# Patient Record
Sex: Male | Born: 1945 | Race: White | Hispanic: No | Marital: Married | Smoking: Former smoker
Health system: Southern US, Community
[De-identification: ages and names within clinical notes are randomized; demographics above are authoritative.]

## PROBLEM LIST (undated history)

## (undated) DIAGNOSIS — E785 Hyperlipidemia, unspecified: Secondary | ICD-10-CM

## (undated) DIAGNOSIS — R3915 Urgency of urination: Secondary | ICD-10-CM

## (undated) DIAGNOSIS — F419 Anxiety disorder, unspecified: Secondary | ICD-10-CM

## (undated) DIAGNOSIS — E860 Dehydration: Secondary | ICD-10-CM

## (undated) DIAGNOSIS — R350 Frequency of micturition: Secondary | ICD-10-CM

## (undated) DIAGNOSIS — N320 Bladder-neck obstruction: Secondary | ICD-10-CM

## (undated) DIAGNOSIS — Z974 Presence of external hearing-aid: Secondary | ICD-10-CM

## (undated) DIAGNOSIS — Z8739 Personal history of other diseases of the musculoskeletal system and connective tissue: Secondary | ICD-10-CM

## (undated) DIAGNOSIS — Z860100 Personal history of colon polyps, unspecified: Secondary | ICD-10-CM

## (undated) DIAGNOSIS — R351 Nocturia: Secondary | ICD-10-CM

## (undated) DIAGNOSIS — I480 Paroxysmal atrial fibrillation: Secondary | ICD-10-CM

## (undated) DIAGNOSIS — N400 Enlarged prostate without lower urinary tract symptoms: Secondary | ICD-10-CM

## (undated) DIAGNOSIS — E876 Hypokalemia: Secondary | ICD-10-CM

## (undated) DIAGNOSIS — M1990 Unspecified osteoarthritis, unspecified site: Secondary | ICD-10-CM

## (undated) DIAGNOSIS — E119 Type 2 diabetes mellitus without complications: Secondary | ICD-10-CM

## (undated) HISTORY — PX: PICC LINE INSERTION: CATH118290

## (undated) HISTORY — PX: COLONOSCOPY: SHX174

## (undated) HISTORY — DX: Hypokalemia: E87.6

## (undated) HISTORY — DX: Dehydration: E86.0

## (undated) HISTORY — DX: Anxiety disorder, unspecified: F41.9

## (undated) HISTORY — DX: Personal history of other diseases of the musculoskeletal system and connective tissue: Z87.39

## (undated) HISTORY — DX: Hyperlipidemia, unspecified: E78.5

---

## 2000-03-11 ENCOUNTER — Ambulatory Visit (HOSPITAL_COMMUNITY): Admission: RE | Admit: 2000-03-11 | Discharge: 2000-03-11 | Payer: Self-pay | Admitting: *Deleted

## 2006-10-18 ENCOUNTER — Observation Stay (HOSPITAL_COMMUNITY): Admission: EM | Admit: 2006-10-18 | Discharge: 2006-10-19 | Payer: Self-pay | Admitting: Emergency Medicine

## 2006-10-27 ENCOUNTER — Ambulatory Visit (HOSPITAL_COMMUNITY): Admission: RE | Admit: 2006-10-27 | Discharge: 2006-10-27 | Payer: Self-pay | Admitting: Family Medicine

## 2006-11-14 ENCOUNTER — Ambulatory Visit (HOSPITAL_COMMUNITY): Admission: RE | Admit: 2006-11-14 | Discharge: 2006-11-14 | Payer: Self-pay | Admitting: Family Medicine

## 2006-11-15 ENCOUNTER — Ambulatory Visit (HOSPITAL_COMMUNITY): Admission: RE | Admit: 2006-11-15 | Discharge: 2006-11-15 | Payer: Self-pay | Admitting: Family Medicine

## 2006-11-16 ENCOUNTER — Ambulatory Visit: Payer: Self-pay | Admitting: Thoracic Surgery

## 2006-11-16 ENCOUNTER — Inpatient Hospital Stay (HOSPITAL_COMMUNITY): Admission: AD | Admit: 2006-11-16 | Discharge: 2006-11-21 | Payer: Self-pay | Admitting: Thoracic Surgery

## 2006-11-17 ENCOUNTER — Ambulatory Visit: Payer: Self-pay | Admitting: Cardiothoracic Surgery

## 2006-11-30 ENCOUNTER — Encounter: Admission: RE | Admit: 2006-11-30 | Discharge: 2006-11-30 | Payer: Self-pay | Admitting: Cardiothoracic Surgery

## 2006-11-30 ENCOUNTER — Ambulatory Visit: Payer: Self-pay | Admitting: Cardiothoracic Surgery

## 2006-12-01 ENCOUNTER — Ambulatory Visit (HOSPITAL_COMMUNITY): Admission: RE | Admit: 2006-12-01 | Discharge: 2006-12-01 | Payer: Self-pay | Admitting: Family Medicine

## 2007-01-19 ENCOUNTER — Encounter: Admission: RE | Admit: 2007-01-19 | Discharge: 2007-01-19 | Payer: Self-pay | Admitting: Cardiothoracic Surgery

## 2007-01-19 ENCOUNTER — Ambulatory Visit: Payer: Self-pay | Admitting: Cardiothoracic Surgery

## 2011-08-30 ENCOUNTER — Other Ambulatory Visit: Payer: Self-pay | Admitting: Gastroenterology

## 2011-08-30 HISTORY — PX: COLONOSCOPY: SHX174

## 2011-08-30 LAB — HM COLONOSCOPY

## 2012-07-21 HISTORY — PX: KNEE ARTHROSCOPY: SHX127

## 2012-12-07 ENCOUNTER — Encounter: Payer: Self-pay | Admitting: Family Medicine

## 2012-12-07 ENCOUNTER — Other Ambulatory Visit: Payer: Self-pay | Admitting: Family Medicine

## 2012-12-07 DIAGNOSIS — N400 Enlarged prostate without lower urinary tract symptoms: Secondary | ICD-10-CM | POA: Insufficient documentation

## 2012-12-07 DIAGNOSIS — F419 Anxiety disorder, unspecified: Secondary | ICD-10-CM | POA: Insufficient documentation

## 2012-12-07 DIAGNOSIS — M109 Gout, unspecified: Secondary | ICD-10-CM | POA: Insufficient documentation

## 2012-12-07 DIAGNOSIS — E785 Hyperlipidemia, unspecified: Secondary | ICD-10-CM

## 2012-12-07 DIAGNOSIS — K635 Polyp of colon: Secondary | ICD-10-CM | POA: Insufficient documentation

## 2012-12-07 MED ORDER — PRAVASTATIN SODIUM 80 MG PO TABS: 80.0000 mg | ORAL_TABLET | Freq: Every day | ORAL | Status: DC

## 2013-01-22 ENCOUNTER — Inpatient Hospital Stay: Admit: 2013-01-22 | Payer: Self-pay | Admitting: Orthopedic Surgery

## 2013-01-22 ENCOUNTER — Encounter (HOSPITAL_COMMUNITY): Payer: Self-pay | Admitting: Pharmacy Technician

## 2013-01-22 SURGERY — ARTHROPLASTY, KNEE, UNICOMPARTMENTAL
Anesthesia: Spinal | Laterality: Left

## 2013-01-30 ENCOUNTER — Inpatient Hospital Stay (HOSPITAL_COMMUNITY): Admission: RE | Admit: 2013-01-30 | Payer: Self-pay | Source: Ambulatory Visit

## 2013-01-31 ENCOUNTER — Other Ambulatory Visit (HOSPITAL_COMMUNITY): Payer: Self-pay | Admitting: *Deleted

## 2013-01-31 NOTE — H&P (Signed)
TOTAL KNEE ADMISSION H&P  Patient is being admitted for left medial unicompartment knee arthroplasty.  Subjective:  Chief Complaint: Left knee medial compartment OA / pain.  HPI: BRUK TUMOLO, 67 y.o. male, has a history of pain and functional disability in the left knee due to arthritis and has failed non-surgical conservative treatments for greater than 12 weeks to includeNSAID's and/or analgesics, corticosteriod injections, viscosupplementation injections, activity modification and aspirations.  Onset of symptoms was gradual, starting >10 years ago with gradually worsening course since that time. The patient noted prior procedures on the knee to include  arthroscopy on the left knee(s).  Patient currently rates pain in the left knee(s) at 7 out of 10 with activity. Patient has night pain, worsening of pain with activity and weight bearing, pain that interferes with activities of daily living, pain with passive range of motion, crepitus and joint swelling.  Patient has evidence of periarticular osteophytes and joint space narrowing of the medial compartment by imaging studies. There is no active infection.  Risks, benefits and expectations were discussed with the patient. Patient understand the risks, benefits and expectations and wishes to proceed with surgery.   D/C Plans:   Home with HHPT  Post-op Meds:   No Rx given    Tranexamic Acid:   To be given  Decadron:    To be given  FYI:    ASA post-op   Patient Active Problem List   Diagnosis Date Noted  . Hyperlipidemia   . Anxiety   . BPH (benign prostatic hyperplasia)   . Colon polyps   . H/O: gout    Past Medical History  Diagnosis Date  . Hyperlipidemia   . Anxiety   . BPH (benign prostatic hyperplasia)   . Colon polyps   . H/O: gout     Past Surgical History  Procedure Laterality Date  . Knee arthroscopy Left 07/2012  . Fx ribs w/puncture lung 2007      No prescriptions prior to admission   Allergies  Allergen  Reactions  . Penicillins Rash    History  Substance Use Topics  . Smoking status: Former Smoker    Quit date: 12/08/2002  . Smokeless tobacco: Never Used  . Alcohol Use: Yes    Family History  Problem Relation Age of Onset  . Adopted: Yes  . Family history unknown: Yes     Review of Systems  Constitutional: Negative.   HENT: Negative.   Eyes: Negative.   Respiratory: Negative.   Cardiovascular: Negative.   Gastrointestinal: Negative.   Genitourinary: Negative.   Musculoskeletal: Positive for joint pain.  Skin: Negative.   Neurological: Negative.   Endo/Heme/Allergies: Negative.   Psychiatric/Behavioral: The patient is nervous/anxious.     Objective:  Physical Exam  Constitutional: He is oriented to person, place, and time. He appears well-developed and well-nourished.  HENT:  Head: Normocephalic and atraumatic.  Mouth/Throat: Oropharynx is clear and moist.  Eyes: Pupils are equal, round, and reactive to light.  Neck: Neck supple. No JVD present. No tracheal deviation present. No thyromegaly present.  Cardiovascular: Normal rate, regular rhythm, normal heart sounds and intact distal pulses.   Respiratory: Effort normal and breath sounds normal. No respiratory distress. He has no wheezes.  GI: Soft. There is no tenderness. There is no guarding.  Musculoskeletal:       Left knee: He exhibits decreased range of motion, swelling and bony tenderness. He exhibits no effusion, no ecchymosis, no deformity, no laceration and no erythema. Tenderness found.  Medial joint line tenderness noted. No lateral joint line tenderness noted.  Lymphadenopathy:    He has no cervical adenopathy.  Neurological: He is alert and oriented to person, place, and time.  Skin: Skin is warm and dry.     Labs:  Estimated body mass index is 34.17 kg/(m^2) as calculated from the following:   Height as of 11/08/12: 6' (1.829 m).   Weight as of 12/07/12: 114.306 kg (252 lb).   Imaging Review Plain  radiographs demonstrate severe degenerative joint disease of the left knee medial compartment. The overall alignment is neutral. The bone quality appears to be good for age and reported activity level.  Assessment/Plan:  End stage arthritis, medial compartment of the left knee   The patient history, physical examination, clinical judgment of the provider and imaging studies are consistent with end stage degenerative joint disease of the left knee(s) and medial unicompartmental knee arthroplasty is deemed medically necessary. The treatment options including medical management, injection therapy arthroscopy and arthroplasty were discussed at length. The risks and benefits of total knee arthroplasty were presented and reviewed. The risks due to aseptic loosening, infection, stiffness, patella tracking problems, thromboembolic complications and other imponderables were discussed. The patient acknowledged the explanation, agreed to proceed with the plan and consent was signed. Patient is being admitted for inpatient treatment for surgery, pain control, PT, OT, prophylactic antibiotics, VTE prophylaxis, progressive ambulation and ADL's and discharge planning. The patient is planning to be discharged home with home health services.    Anastasio Auerbach Seanmichael Salmons   PAC  01/31/2013, 9:47 PM

## 2013-02-01 ENCOUNTER — Encounter (HOSPITAL_COMMUNITY)
Admission: RE | Admit: 2013-02-01 | Discharge: 2013-02-01 | Disposition: A | Discharge status: Home / Self Care | Payer: Medicare Other | Source: Ambulatory Visit | Attending: Orthopedic Surgery | Admitting: Orthopedic Surgery

## 2013-02-01 ENCOUNTER — Encounter (HOSPITAL_COMMUNITY): Payer: Self-pay

## 2013-02-01 DIAGNOSIS — M1710 Unilateral primary osteoarthritis, unspecified knee: Secondary | ICD-10-CM | POA: Insufficient documentation

## 2013-02-01 DIAGNOSIS — Z01812 Encounter for preprocedural laboratory examination: Secondary | ICD-10-CM | POA: Insufficient documentation

## 2013-02-01 HISTORY — DX: Unspecified osteoarthritis, unspecified site: M19.90

## 2013-02-01 LAB — CBC
Platelets: 245 10*3/uL (ref 150–400)
RDW: 12.8 % (ref 11.5–15.5)
WBC: 8.6 10*3/uL (ref 4.0–10.5)

## 2013-02-01 LAB — BASIC METABOLIC PANEL
Calcium: 9.2 mg/dL (ref 8.4–10.5)
Creatinine, Ser: 0.88 mg/dL (ref 0.50–1.35)
GFR calc Af Amer: >90 mL/min (ref >90)
GFR calc non Af Amer: 88 mL/min — ABNORMAL LOW (ref >90)
Sodium: 145 mEq/L (ref 135–145)

## 2013-02-01 LAB — SURGICAL PCR SCREEN: Staphylococcus aureus: NEGATIVE

## 2013-02-01 LAB — URINALYSIS, ROUTINE W REFLEX MICROSCOPIC
Glucose, UA: NEGATIVE mg/dL
Ketones, ur: NEGATIVE mg/dL
Leukocytes, UA: NEGATIVE
Protein, ur: NEGATIVE mg/dL
Urobilinogen, UA: 0.2 mg/dL (ref 0.0–1.0)

## 2013-02-01 LAB — PROTIME-INR
INR: 1.03 (ref 0.00–1.49)
Prothrombin Time: 13.4 seconds (ref 11.6–15.2)

## 2013-02-01 LAB — APTT: aPTT: 38 seconds — ABNORMAL HIGH (ref 24–37)

## 2013-02-01 NOTE — Patient Instructions (Addendum)
Jimmy Bradshaw  02/01/2013                           YOUR PROCEDURE IS SCHEDULED ON: 02/05/13               PLEASE REPORT TO SHORT STAY CENTER AT :  12:30 PM               CALL THIS NUMBER IF ANY PROBLEMS THE DAY OF SURGERY :               [phone]                      REMEMBER:   Do not eat food or drink liquids AFTER MIDNIGHT  May have clear liquids UNTIL 6 HOURS BEFORE SURGERY (9:30 AM)  Clear liquids include soda, tea, black coffee, apple or grape juice, broth.  Take these medicines the morning of surgery with A SIP OF WATER: BUPROPION / RAPAFLO / VIBRYD   Do not wear jewelry, make-up   Do not wear lotions, powders, or perfumes.   Do not shave legs or underarms 12 hrs. before surgery (men may shave face)  Do not bring valuables to the hospital.  Contacts, dentures or bridgework may not be worn into surgery.  Leave suitcase in the car. After surgery it may be brought to your room.  For patients admitted to the hospital more than one night, checkout time is 11:00                          The day of discharge.   Patients discharged the day of surgery will not be allowed to drive home                             If going home same day of surgery, must have someone stay with you first                           24 hrs at home and arrange for some one to drive you home from hospital.    Special Instructions:   Please read over the following fact sheets that you were given:               1. MRSA  INFORMATION                      2. [place] PREPARING FOR SURGERY SHEET               3. INCENTIVE SPIROMETER                                                X_____________________________________________________________________        Failure to follow these instructions may result in cancellation of your surgery

## 2013-02-05 ENCOUNTER — Encounter (HOSPITAL_COMMUNITY): Payer: Self-pay | Admitting: *Deleted

## 2013-02-05 ENCOUNTER — Inpatient Hospital Stay (HOSPITAL_COMMUNITY)
Admission: RE | Admit: 2013-02-05 | Discharge: 2013-02-06 | DRG: 470 | Disposition: A | Discharge status: Home Health | Payer: Medicare Other | Source: Ambulatory Visit | Attending: Orthopedic Surgery | Admitting: Orthopedic Surgery

## 2013-02-05 ENCOUNTER — Ambulatory Visit (HOSPITAL_COMMUNITY): Payer: Medicare Other | Admitting: Anesthesiology

## 2013-02-05 ENCOUNTER — Encounter (HOSPITAL_COMMUNITY): Payer: Self-pay | Admitting: Anesthesiology

## 2013-02-05 ENCOUNTER — Encounter (HOSPITAL_COMMUNITY)
Admission: RE | Disposition: A | Discharge status: Home Health | Payer: Self-pay | Source: Ambulatory Visit | Attending: Orthopedic Surgery

## 2013-02-05 DIAGNOSIS — M109 Gout, unspecified: Secondary | ICD-10-CM | POA: Diagnosis present

## 2013-02-05 DIAGNOSIS — E669 Obesity, unspecified: Secondary | ICD-10-CM | POA: Diagnosis present

## 2013-02-05 DIAGNOSIS — M1710 Unilateral primary osteoarthritis, unspecified knee: Principal | ICD-10-CM | POA: Diagnosis present

## 2013-02-05 DIAGNOSIS — N400 Enlarged prostate without lower urinary tract symptoms: Secondary | ICD-10-CM | POA: Diagnosis present

## 2013-02-05 DIAGNOSIS — E785 Hyperlipidemia, unspecified: Secondary | ICD-10-CM | POA: Diagnosis present

## 2013-02-05 DIAGNOSIS — Z87891 Personal history of nicotine dependence: Secondary | ICD-10-CM

## 2013-02-05 DIAGNOSIS — Z79899 Other long term (current) drug therapy: Secondary | ICD-10-CM

## 2013-02-05 DIAGNOSIS — Z6834 Body mass index (BMI) 34.0-34.9, adult: Secondary | ICD-10-CM

## 2013-02-05 DIAGNOSIS — F411 Generalized anxiety disorder: Secondary | ICD-10-CM | POA: Diagnosis present

## 2013-02-05 DIAGNOSIS — Z880 Allergy status to penicillin: Secondary | ICD-10-CM

## 2013-02-05 DIAGNOSIS — D126 Benign neoplasm of colon, unspecified: Secondary | ICD-10-CM | POA: Diagnosis present

## 2013-02-05 DIAGNOSIS — Z96652 Presence of left artificial knee joint: Secondary | ICD-10-CM

## 2013-02-05 HISTORY — PX: PARTIAL KNEE ARTHROPLASTY: SHX2174

## 2013-02-05 SURGERY — ARTHROPLASTY, KNEE, UNICOMPARTMENTAL
Anesthesia: Spinal | Site: Knee | Laterality: Left | Wound class: Clean

## 2013-02-05 MED ORDER — TRANEXAMIC ACID 100 MG/ML IV SOLN
1000.0000 mg | Freq: Once | INTRAVENOUS | Status: AC
  Administered 2013-02-05: 1000 mg via INTRAVENOUS
  Filled 2013-02-05: qty 10

## 2013-02-05 MED ORDER — PHENYLEPHRINE HCL 10 MG/ML IJ SOLN
INTRAMUSCULAR | Status: DC | PRN
  Administered 2013-02-05 (×3): 80 ug via INTRAVENOUS

## 2013-02-05 MED ORDER — CHLORHEXIDINE GLUCONATE 4 % EX LIQD: 60.0000 mL | Freq: Once | CUTANEOUS | Status: DC

## 2013-02-05 MED ORDER — ASPIRIN EC 325 MG PO TBEC
325.0000 mg | DELAYED_RELEASE_TABLET | Freq: Two times a day (BID) | ORAL | Status: DC
  Administered 2013-02-06: 325 mg via ORAL
  Filled 2013-02-05 (×3): qty 1

## 2013-02-05 MED ORDER — PROMETHAZINE HCL 25 MG/ML IJ SOLN: 6.2500 mg | INTRAMUSCULAR | Status: DC | PRN

## 2013-02-05 MED ORDER — PROPOFOL INFUSION 10 MG/ML OPTIME
INTRAVENOUS | Status: DC | PRN
  Administered 2013-02-05: 75 ug/kg/min via INTRAVENOUS

## 2013-02-05 MED ORDER — ACETAMINOPHEN 10 MG/ML IV SOLN
INTRAVENOUS | Status: DC | PRN
  Administered 2013-02-05: 1000 mg via INTRAVENOUS

## 2013-02-05 MED ORDER — ZOLPIDEM TARTRATE 5 MG PO TABS: 5.0000 mg | ORAL_TABLET | Freq: Every evening | ORAL | Status: DC | PRN

## 2013-02-05 MED ORDER — LACTATED RINGERS IV SOLN
INTRAVENOUS | Status: DC
  Administered 2013-02-05: 1000 mL via INTRAVENOUS
  Administered 2013-02-05 (×2): via INTRAVENOUS

## 2013-02-05 MED ORDER — DIPHENHYDRAMINE HCL 25 MG PO CAPS: 25.0000 mg | ORAL_CAPSULE | Freq: Four times a day (QID) | ORAL | Status: DC | PRN

## 2013-02-05 MED ORDER — SIMVASTATIN 40 MG PO TABS
40.0000 mg | ORAL_TABLET | Freq: Every day | ORAL | Status: DC
  Filled 2013-02-05: qty 1

## 2013-02-05 MED ORDER — DOCUSATE SODIUM 100 MG PO CAPS
100.0000 mg | ORAL_CAPSULE | Freq: Two times a day (BID) | ORAL | Status: DC
  Administered 2013-02-05 – 2013-02-06 (×2): 100 mg via ORAL

## 2013-02-05 MED ORDER — CELECOXIB 200 MG PO CAPS
200.0000 mg | ORAL_CAPSULE | Freq: Two times a day (BID) | ORAL | Status: DC
  Administered 2013-02-05 – 2013-02-06 (×2): 200 mg via ORAL
  Filled 2013-02-05 (×3): qty 1

## 2013-02-05 MED ORDER — 0.9 % SODIUM CHLORIDE (POUR BTL) OPTIME
TOPICAL | Status: DC | PRN
  Administered 2013-02-05: 1000 mL

## 2013-02-05 MED ORDER — BUPIVACAINE-EPINEPHRINE PF 0.25-1:200000 % IJ SOLN
INTRAMUSCULAR | Status: DC | PRN
  Administered 2013-02-05: 25 mL

## 2013-02-05 MED ORDER — BISACODYL 10 MG RE SUPP: 10.0000 mg | Freq: Every day | RECTAL | Status: DC | PRN

## 2013-02-05 MED ORDER — HYDROMORPHONE HCL PF 1 MG/ML IJ SOLN: 0.5000 mg | INTRAMUSCULAR | Status: DC | PRN

## 2013-02-05 MED ORDER — HYDROCODONE-ACETAMINOPHEN 7.5-325 MG PO TABS
1.0000 | ORAL_TABLET | ORAL | Status: DC
  Administered 2013-02-05: 1 via ORAL
  Administered 2013-02-05 – 2013-02-06 (×2): 2 via ORAL
  Administered 2013-02-06 (×3): 1 via ORAL
  Filled 2013-02-05: qty 1
  Filled 2013-02-05 (×3): qty 2
  Filled 2013-02-05: qty 1
  Filled 2013-02-05: qty 2

## 2013-02-05 MED ORDER — ACETAMINOPHEN 10 MG/ML IV SOLN
INTRAVENOUS | Status: AC
  Filled 2013-02-05: qty 100

## 2013-02-05 MED ORDER — ONDANSETRON HCL 4 MG/2ML IJ SOLN: 4.0000 mg | Freq: Four times a day (QID) | INTRAMUSCULAR | Status: DC | PRN

## 2013-02-05 MED ORDER — KETOROLAC TROMETHAMINE 30 MG/ML IJ SOLN
INTRAMUSCULAR | Status: AC
  Filled 2013-02-05: qty 1

## 2013-02-05 MED ORDER — CLINDAMYCIN PHOSPHATE 900 MG/50ML IV SOLN
900.0000 mg | INTRAVENOUS | Status: AC
  Administered 2013-02-05: 900 mg via INTRAVENOUS
  Filled 2013-02-05: qty 50

## 2013-02-05 MED ORDER — CLINDAMYCIN PHOSPHATE 900 MG/50ML IV SOLN
INTRAVENOUS | Status: AC
  Filled 2013-02-05: qty 50

## 2013-02-05 MED ORDER — FENTANYL CITRATE 0.05 MG/ML IJ SOLN
INTRAMUSCULAR | Status: DC | PRN
  Administered 2013-02-05: 100 ug via INTRAVENOUS

## 2013-02-05 MED ORDER — FLEET ENEMA 7-19 GM/118ML RE ENEM: 1.0000 | ENEMA | Freq: Once | RECTAL | Status: AC | PRN

## 2013-02-05 MED ORDER — POLYETHYLENE GLYCOL 3350 17 G PO PACK
17.0000 g | PACK | Freq: Two times a day (BID) | ORAL | Status: DC
  Administered 2013-02-05 – 2013-02-06 (×2): 17 g via ORAL

## 2013-02-05 MED ORDER — BUPIVACAINE-EPINEPHRINE PF 0.25-1:200000 % IJ SOLN
INTRAMUSCULAR | Status: AC
  Filled 2013-02-05: qty 30

## 2013-02-05 MED ORDER — METHOCARBAMOL 500 MG PO TABS: 500.0000 mg | ORAL_TABLET | Freq: Four times a day (QID) | ORAL | Status: DC | PRN

## 2013-02-05 MED ORDER — DEXAMETHASONE SODIUM PHOSPHATE 10 MG/ML IJ SOLN
10.0000 mg | Freq: Once | INTRAMUSCULAR | Status: DC
  Filled 2013-02-05: qty 1

## 2013-02-05 MED ORDER — PHENOL 1.4 % MT LIQD: 1.0000 | OROMUCOSAL | Status: DC | PRN

## 2013-02-05 MED ORDER — SODIUM CHLORIDE 0.9 % IV SOLN
INTRAVENOUS | Status: DC
  Administered 2013-02-05: 20:00:00 via INTRAVENOUS
  Filled 2013-02-05 (×3): qty 1000

## 2013-02-05 MED ORDER — HYDROMORPHONE HCL PF 1 MG/ML IJ SOLN: 0.2500 mg | INTRAMUSCULAR | Status: DC | PRN

## 2013-02-05 MED ORDER — ONDANSETRON HCL 4 MG PO TABS: 4.0000 mg | ORAL_TABLET | Freq: Four times a day (QID) | ORAL | Status: DC | PRN

## 2013-02-05 MED ORDER — BUPROPION HCL ER (XL) 300 MG PO TB24
300.0000 mg | ORAL_TABLET | Freq: Every morning | ORAL | Status: DC
  Administered 2013-02-06: 300 mg via ORAL
  Filled 2013-02-05: qty 1

## 2013-02-05 MED ORDER — BUPIVACAINE LIPOSOME 1.3 % IJ SUSP
20.0000 mL | Freq: Once | INTRAMUSCULAR | Status: DC
  Filled 2013-02-05: qty 20

## 2013-02-05 MED ORDER — CLINDAMYCIN PHOSPHATE 600 MG/50ML IV SOLN
600.0000 mg | Freq: Four times a day (QID) | INTRAVENOUS | Status: AC
  Administered 2013-02-05 – 2013-02-06 (×2): 600 mg via INTRAVENOUS
  Filled 2013-02-05 (×2): qty 50

## 2013-02-05 MED ORDER — BUPIVACAINE IN DEXTROSE 0.75-8.25 % IT SOLN
INTRATHECAL | Status: DC | PRN
  Administered 2013-02-05: 2 mL via INTRATHECAL

## 2013-02-05 MED ORDER — MENTHOL 3 MG MT LOZG: 1.0000 | LOZENGE | OROMUCOSAL | Status: DC | PRN

## 2013-02-05 MED ORDER — MIDAZOLAM HCL 5 MG/5ML IJ SOLN
INTRAMUSCULAR | Status: DC | PRN
  Administered 2013-02-05: 1.5 mg via INTRAVENOUS
  Administered 2013-02-05: 2 mg via INTRAVENOUS

## 2013-02-05 MED ORDER — KETOROLAC TROMETHAMINE 30 MG/ML IJ SOLN
INTRAMUSCULAR | Status: DC | PRN
  Administered 2013-02-05: 30 mg via INTRAVENOUS

## 2013-02-05 MED ORDER — DEXAMETHASONE SODIUM PHOSPHATE 10 MG/ML IJ SOLN
10.0000 mg | Freq: Once | INTRAMUSCULAR | Status: AC
  Administered 2013-02-05: 10 mg via INTRAVENOUS

## 2013-02-05 MED ORDER — DEXTROSE 5 % IV SOLN: 500.0000 mg | Freq: Four times a day (QID) | INTRAVENOUS | Status: DC | PRN

## 2013-02-05 MED ORDER — TAMSULOSIN HCL 0.4 MG PO CAPS
0.4000 mg | ORAL_CAPSULE | Freq: Every day | ORAL | Status: DC
Start: 2013-02-06 — End: 2013-02-06
  Administered 2013-02-06: 0.4 mg via ORAL
  Filled 2013-02-05: qty 1

## 2013-02-05 MED ORDER — ALUM & MAG HYDROXIDE-SIMETH 200-200-20 MG/5ML PO SUSP: 30.0000 mL | ORAL | Status: DC | PRN

## 2013-02-05 MED ORDER — STERILE WATER FOR IRRIGATION IR SOLN
Status: DC | PRN
  Administered 2013-02-05: 1500 mL

## 2013-02-05 MED ORDER — FERROUS SULFATE 325 (65 FE) MG PO TABS
325.0000 mg | ORAL_TABLET | Freq: Three times a day (TID) | ORAL | Status: DC
  Administered 2013-02-05 – 2013-02-06 (×2): 325 mg via ORAL
  Filled 2013-02-05 (×5): qty 1

## 2013-02-05 MED ORDER — METOCLOPRAMIDE HCL 10 MG PO TABS: 5.0000 mg | ORAL_TABLET | Freq: Three times a day (TID) | ORAL | Status: DC | PRN

## 2013-02-05 MED ORDER — SODIUM CHLORIDE 0.9 % IJ SOLN
INTRAMUSCULAR | Status: DC | PRN
  Administered 2013-02-05: 16:00:00

## 2013-02-05 MED ORDER — VILAZODONE HCL 20 MG PO TABS
1.0000 | ORAL_TABLET | Freq: Every morning | ORAL | Status: DC
  Administered 2013-02-06: 20 mg via ORAL
  Filled 2013-02-05: qty 1

## 2013-02-05 MED ORDER — METOCLOPRAMIDE HCL 5 MG/ML IJ SOLN: 5.0000 mg | Freq: Three times a day (TID) | INTRAMUSCULAR | Status: DC | PRN

## 2013-02-05 SURGICAL SUPPLY — 58 items
ADH SKN CLS APL DERMABOND .7 (GAUZE/BANDAGES/DRESSINGS) ×1
BAG SPEC THK2 15X12 ZIP CLS (MISCELLANEOUS) ×1
BAG ZIPLOCK 12X15 (MISCELLANEOUS) ×2 IMPLANT
BANDAGE ELASTIC 6 VELCRO ST LF (GAUZE/BANDAGES/DRESSINGS) ×2 IMPLANT
BANDAGE ESMARK 6X9 LF (GAUZE/BANDAGES/DRESSINGS) ×1 IMPLANT
BLADE SAW RECIPROCATING 77.5 (BLADE) ×2 IMPLANT
BLADE SAW SGTL 13.0X1.19X90.0M (BLADE) ×2 IMPLANT
BNDG CMPR 9X6 STRL LF SNTH (GAUZE/BANDAGES/DRESSINGS) ×1
BNDG ESMARK 6X9 LF (GAUZE/BANDAGES/DRESSINGS) ×2
BOWL SMART MIX CTS (DISPOSABLE) ×2 IMPLANT
CEMENT HV SMART SET (Cement) ×2 IMPLANT
CLOTH BEACON ORANGE TIMEOUT ST (SAFETY) ×2 IMPLANT
COVER SURGICAL LIGHT HANDLE (MISCELLANEOUS) ×2 IMPLANT
CUFF TOURN SGL QUICK 34 (TOURNIQUET CUFF) ×2
CUFF TRNQT CYL 34X4X40X1 (TOURNIQUET CUFF) ×1 IMPLANT
DERMABOND ADVANCED (GAUZE/BANDAGES/DRESSINGS) ×1
DERMABOND ADVANCED .7 DNX12 (GAUZE/BANDAGES/DRESSINGS) ×1 IMPLANT
DRAPE EXTREMITY T 121X128X90 (DRAPE) ×2 IMPLANT
DRAPE POUCH INSTRU U-SHP 10X18 (DRAPES) ×2 IMPLANT
DRSG AQUACEL AG ADV 3.5X 6 (GAUZE/BANDAGES/DRESSINGS) ×2 IMPLANT
DRSG AQUACEL AG ADV 3.5X10 (GAUZE/BANDAGES/DRESSINGS) ×1 IMPLANT
DRSG TEGADERM 4X4.75 (GAUZE/BANDAGES/DRESSINGS) ×2 IMPLANT
DURAPREP 26ML APPLICATOR (WOUND CARE) ×2 IMPLANT
ELECT REM PT RETURN 9FT ADLT (ELECTROSURGICAL) ×2
ELECTRODE REM PT RTRN 9FT ADLT (ELECTROSURGICAL) ×1 IMPLANT
EVACUATOR 1/8 PVC DRAIN (DRAIN) ×2 IMPLANT
FACESHIELD LNG OPTICON STERILE (SAFETY) ×8 IMPLANT
GAUZE SPONGE 2X2 8PLY STRL LF (GAUZE/BANDAGES/DRESSINGS) ×1 IMPLANT
GLOVE BIOGEL PI IND STRL 6.5 (GLOVE) IMPLANT
GLOVE BIOGEL PI IND STRL 7.5 (GLOVE) ×1 IMPLANT
GLOVE BIOGEL PI IND STRL 8 (GLOVE) IMPLANT
GLOVE BIOGEL PI INDICATOR 6.5 (GLOVE) ×1
GLOVE BIOGEL PI INDICATOR 7.5 (GLOVE) ×2
GLOVE BIOGEL PI INDICATOR 8 (GLOVE)
GLOVE ORTHO TXT STRL SZ7.5 (GLOVE) ×4 IMPLANT
GLOVE SURG SS PI 6.5 STRL IVOR (GLOVE) ×1 IMPLANT
GLOVE SURG SS PI 7.5 STRL IVOR (GLOVE) ×1 IMPLANT
GLOVE SURG SS PI 8.5 STRL IVOR (GLOVE) ×2
GLOVE SURG SS PI 8.5 STRL STRW (GLOVE) IMPLANT
GOWN BRE IMP PREV XXLGXLNG (GOWN DISPOSABLE) ×4 IMPLANT
GOWN STRL NON-REIN LRG LVL3 (GOWN DISPOSABLE) ×3 IMPLANT
GOWN STRL REIN 2XL XLG LVL4 (GOWN DISPOSABLE) ×1 IMPLANT
KIT BASIN OR (CUSTOM PROCEDURE TRAY) ×2 IMPLANT
LEGGING LITHOTOMY PAIR STRL (DRAPES) ×2 IMPLANT
MANIFOLD NEPTUNE II (INSTRUMENTS) ×2 IMPLANT
NDL SAFETY ECLIPSE 18X1.5 (NEEDLE) ×1 IMPLANT
NEEDLE HYPO 18GX1.5 SHARP (NEEDLE) ×2
PACK TOTAL JOINT (CUSTOM PROCEDURE TRAY) ×2 IMPLANT
POSITIONER SURGICAL ARM (MISCELLANEOUS) ×2 IMPLANT
SPONGE GAUZE 2X2 STER 10/PKG (GAUZE/BANDAGES/DRESSINGS) ×1
SUCTION FRAZIER TIP 10 FR DISP (SUCTIONS) ×2 IMPLANT
SUT MNCRL AB 4-0 PS2 18 (SUTURE) ×2 IMPLANT
SUT VIC AB 1 CT1 36 (SUTURE) ×4 IMPLANT
SUT VIC AB 2-0 CT1 27 (SUTURE) ×4
SUT VIC AB 2-0 CT1 TAPERPNT 27 (SUTURE) ×2 IMPLANT
SYR 50ML LL SCALE MARK (SYRINGE) ×2 IMPLANT
TOWEL OR 17X26 10 PK STRL BLUE (TOWEL DISPOSABLE) ×4 IMPLANT
TRAY FOLEY CATH 14FRSI W/METER (CATHETERS) ×2 IMPLANT

## 2013-02-05 NOTE — Op Note (Signed)
NAME: Jimmy Bradshaw    MEDICAL RECORD NO.: [id number]   FACILITY: [facility]   DATE OF BIRTH: 24-Sep-1945  PHYSICIAN: Madlyn Frankel. Charlann Boxer, M.D.    DATE OF PROCEDURE: 02/05/2013    OPERATIVE REPORT   PREOPERATIVE DIAGNOSIS: Left knee medial compartment osteoarthritis.   POSTOPERATIVE DIAGNOSIS: Left knee medial compartment osteoarthritis.  PROCEDURE: Left partial knee replacement utilizing Biomet Oxford knee  component, size medium twin peg femur, a left medial size C tibial tray with a size 4 left medial insert.   SURGEON: Madlyn Frankel. Charlann Boxer, M.D.   ASSISTANT: Lanney Gins, PAC.  Please note that Mr. Jimmy Bradshaw was present for the entirety of the case,  utilized for preoperative positioning, perioperative retractor  management, general facilitation of the case and primary wound closure.   ANESTHESIA: Spinal.   SPECIMENS: None.   COMPLICATIONS: None.  DRAINS: 1 medium HV   TOURNIQUET TIME: 33 minutes at 250 mmHg.   INDICATIONS FOR PROCEDURE: The patient is a 67 yo male patient of mine who presented for evaluation of left knee pain.  They presented with primary complaints of pain on the medial side of their knee. Radiographs revealed advanced medial compartment arthritis with specifically an antero-medial wear pattern.  There was bone on bone changes noted with subchondral sclerosis and osteophytes present. The patient has had progressive problems failing to respond to conservative measures of medications, injections and activity modification. Risks of infection, DVT, component failure, need for future revision surgery were all discussed and reviewed.  Consent was obtained for benefit of pain relief.   PROCEDURE IN DETAIL: The patient was brought to the operative theater.  Once adequate anesthesia, preoperative antibiotics, 2gm Ancef administered, the patient was positioned in supine position with a left thigh tourniquet  placed. The left lower extremity was prepped and draped in sterile   fashion with the leg on the Oxford leg holder.  The leg was allowed to flex to 120 degrees. A time-out  was performed identifying the patient, planned procedure, and extremity.  The leg was exsanguinated, tourniquet elevated to 250 mmHg. A midline  incision was made from the proximal pole of the patella to the tibial tubercle. A  soft tissue plane was created and partial median arthrotomy was then  made to allow for subluxation of the patella. Following initial synovectomy and  debridement, the osteophytes were removed off the medial aspect of the  knee.   Attention was first directed to the tibia. The tibial  extramedullary guide was positioned over the anterior crest of the tibia  and pinned into position, and using a measured resection guide from the  Oxford system, a 4 mm resection was made off the proximal tibia. First  the reciprocating saw along the medial aspect of the tibial spines, then the oscillating saw.    At this point, I sized this cut surface seem to be best fit for a size C tibial tray.  With the retractors out of the wound and the knee held at 90 degrees the 4 feeler gauge had appropriate tension on the medial ligament.   At this point, the femoral canal was opened with a drill and the  intramedullary rod passed. Then using the guide for a medium posterior resection off  the posterior aspect of the femur was positioned over the mid portion of the medial femoral condyle.  The orientation was set using the guide that mates the femoral guide to the intramedullary rod.  The 2 drill holes were made into the distal  femur.  The posterior guide was then impacted into place and the posterior  femoral cut made.  At this point, I milled the distal femur with a size 4 spigot in place. At this point, we did a trial reduction of the medium femur, size C tibial tray and a 4 feeler gauge. At 90 degrees of  flexion and at 20 degrees of flexion the knee had symmetric tension on  the  ligaments.   Given these findings, the trial femoral component was removed. Final preparation of tibia was carried out by pinning it in position. Then  using a reciprocating saw I removed bone for the keel. Further bone was  removed with an osteotome.  Trial reduction was now carried out with the medium femur, the keeled C tibia, and the 4 lollipop insert. The balance of the  ligaments appeared to be symmetric at 20 degrees and 90 degrees. Given  all these findings, the trial components were removed.   Cement was mixed. The final components were opened. The knee was irrigated with  normal saline solution. Then final debridements of the  soft tissue was carried out, I also drilled the sclerotic bone with a drill.  The final components were cemented with a single batch of cement in a  two-stage technique with the tibial component cemented first. The knee  was then brought  to 45 degrees of flexion with a 4 feeler gauge, held with pressure for a minute and half.  After this the femoral component was cemented in place.  The knee was again held at 45 degrees of flexion while the cement fully cured.  Excess cement was removed throughout the knee. Tourniquet was let down  after 33 minutes. After the cement had fully cured and excessive cement  was removed throughout the knee there was no visualized cement present.   The final size 4 left medial insert was chosen and snapped into position. We re-irrigated  the knee. I placed a medium Hemovac drain deep. The extensor mechanism  was then reapproximated using a #1 Vicryl with the knee in flexion. The  remaining wound was closed with 2-0 Vicryl and a running 4-0 Monocryl.  The knee was cleaned, dried, and dressed sterilely using Dermabond and  Aquacel dressing. The drain site was dressed separately. The patient  was brought to the recovery room, Ace wrap in place, tolerating the  procedure well. He will be in the hospital for overnight observation.   We will initiate physical therapy and progress to ambulate.     Madlyn Frankel Charlann Boxer, M.D.

## 2013-02-05 NOTE — Anesthesia Postprocedure Evaluation (Signed)
  Anesthesia Post-op Note  Patient: Jimmy Bradshaw  Procedure(s) Performed: Procedure(s) (LRB): LEFT KNEE MEDIAL UNICOMPARTMENTAL KNEE (Left)  Patient Location: PACU  Anesthesia Type: Spinal  Level of Consciousness: awake and alert   Airway and Oxygen Therapy: Patient Spontanous Breathing  Post-op Pain: mild  Post-op Assessment: Post-op Vital signs reviewed, Patient's Cardiovascular Status Stable, Respiratory Function Stable, Patent Airway and No signs of Nausea or vomiting  Last Vitals:  Filed Vitals:   02/05/13 1730  BP: 119/62  Pulse: 74  Temp:   Resp: 13    Post-op Vital Signs: stable   Complications: No apparent anesthesia complications

## 2013-02-05 NOTE — Anesthesia Procedure Notes (Signed)

## 2013-02-05 NOTE — Anesthesia Preprocedure Evaluation (Signed)
Anesthesia Evaluation  Patient identified by MRN, date of birth, ID band Patient awake    Reviewed: Allergy & Precautions, H&P , NPO status , Patient's Chart, lab work & pertinent test results  Airway Mallampati: II TM Distance: >3 FB Neck ROM: Full    Dental no notable dental hx.    Pulmonary neg pulmonary ROS,  breath sounds clear to auscultation  Pulmonary exam normal       Cardiovascular negative cardio ROS  Rhythm:Regular Rate:Normal     Neuro/Psych negative neurological ROS  negative psych ROS   GI/Hepatic negative GI ROS, Neg liver ROS,   Endo/Other  Morbid obesity  Renal/GU negative Renal ROS  negative genitourinary   Musculoskeletal negative musculoskeletal ROS (+)   Abdominal   Peds negative pediatric ROS (+)  Hematology negative hematology ROS (+)   Anesthesia Other Findings   Reproductive/Obstetrics negative OB ROS                           Anesthesia Physical Anesthesia Plan  ASA: II  Anesthesia Plan: Spinal   Post-op Pain Management:    Induction:   Airway Management Planned: Simple Face Mask  Additional Equipment:   Intra-op Plan:   Post-operative Plan:   Informed Consent: I have reviewed the patients History and Physical, chart, labs and discussed the procedure including the risks, benefits and alternatives for the proposed anesthesia with the patient or authorized representative who has indicated his/her understanding and acceptance.     Plan Discussed with: CRNA and Surgeon  Anesthesia Plan Comments:         Anesthesia Quick Evaluation

## 2013-02-05 NOTE — Interval H&P Note (Signed)
History and Physical Interval Note:  02/05/2013 2:44 PM  Jimmy Bradshaw  has presented today for surgery, with the diagnosis of LEFT MEDIAL COMPARTMENTAL OA  The various methods of treatment have been discussed with the patient and family. After consideration of risks, benefits and other options for treatment, the patient has consented to  Procedure(s): LEFT KNEE MEDIAL UNICOMPARTMENTAL KNEE (Left) as a surgical intervention .  The patient's history has been reviewed, patient examined, no change in status, stable for surgery.  I have reviewed the patient's chart and labs.  Questions were answered to the patient's satisfaction.     Shelda Pal

## 2013-02-05 NOTE — Transfer of Care (Signed)
Immediate Anesthesia Transfer of Care Note  Patient: Jimmy Bradshaw  Procedure(s) Performed: Procedure(s): LEFT KNEE MEDIAL UNICOMPARTMENTAL KNEE (Left)  Patient Location: PACU  Anesthesia Type:Spinal  Level of Consciousness: awake, alert , oriented and patient cooperative  Airway & Oxygen Therapy: Patient Spontanous Breathing and Patient connected to face mask oxygen  Post-op Assessment: Report given to PACU RN and Post -op Vital signs reviewed and stable  Post vital signs: Reviewed and stable  Complications: No apparent anesthesia complications

## 2013-02-06 ENCOUNTER — Encounter (HOSPITAL_COMMUNITY): Payer: Self-pay | Admitting: Orthopedic Surgery

## 2013-02-06 DIAGNOSIS — E669 Obesity, unspecified: Secondary | ICD-10-CM | POA: Diagnosis present

## 2013-02-06 LAB — CBC
HCT: 40.3 % (ref 39.0–52.0)
Hemoglobin: 13.9 g/dL (ref 13.0–17.0)
MCH: 30.1 pg (ref 26.0–34.0)
MCV: 87.2 fL (ref 78.0–100.0)
RBC: 4.62 MIL/uL (ref 4.22–5.81)

## 2013-02-06 LAB — BASIC METABOLIC PANEL
CO2: 26 mEq/L (ref 19–32)
Calcium: 8.8 mg/dL (ref 8.4–10.5)
Glucose, Bld: 155 mg/dL — ABNORMAL HIGH (ref 70–99)
Sodium: 138 mEq/L (ref 135–145)

## 2013-02-06 MED ORDER — DSS 100 MG PO CAPS: 100.0000 mg | ORAL_CAPSULE | Freq: Two times a day (BID) | ORAL | Status: DC

## 2013-02-06 MED ORDER — HYDROCODONE-ACETAMINOPHEN 7.5-325 MG PO TABS: 1.0000 | ORAL_TABLET | ORAL | Status: DC

## 2013-02-06 MED ORDER — PNEUMOCOCCAL VAC POLYVALENT 25 MCG/0.5ML IJ INJ
0.5000 mL | INJECTION | INTRAMUSCULAR | Status: DC
  Filled 2013-02-06: qty 0.5

## 2013-02-06 MED ORDER — POLYETHYLENE GLYCOL 3350 17 G PO PACK: 17.0000 g | PACK | Freq: Two times a day (BID) | ORAL | Status: DC

## 2013-02-06 MED ORDER — FERROUS SULFATE 325 (65 FE) MG PO TABS: 325.0000 mg | ORAL_TABLET | Freq: Three times a day (TID) | ORAL | Status: DC

## 2013-02-06 MED ORDER — ASPIRIN 325 MG PO TBEC: 325.0000 mg | DELAYED_RELEASE_TABLET | Freq: Two times a day (BID) | ORAL | Status: DC

## 2013-02-06 MED ORDER — TIZANIDINE HCL 4 MG PO CAPS: 4.0000 mg | ORAL_CAPSULE | Freq: Three times a day (TID) | ORAL | Status: DC

## 2013-02-06 NOTE — Progress Notes (Signed)
   Subjective: 1 Day Post-Op Procedure(s) (LRB): LEFT KNEE MEDIAL UNICOMPARTMENTAL KNEE (Left)   Patient reports pain as mild, pain well controlled. No events throughout the night. Ready to be discharged home.  Objective:   VITALS:   Filed Vitals:   02/06/13 0624  BP: 116/74  Pulse: 76  Temp: 97.6 F (36.4 C)  Resp: 16    Neurovascular intact Dorsiflexion/Plantar flexion intact Incision: dressing C/D/I No cellulitis present Compartment soft  LABS  Recent Labs  02/06/13 0435  HGB 13.9  HCT 40.3  WBC 14.0*  PLT 242     Recent Labs  02/06/13 0435  NA 138  K 4.6  BUN 15  CREATININE 0.80  GLUCOSE 155*     Assessment/Plan: 1 Day Post-Op Procedure(s) (LRB): LEFT KNEE MEDIAL UNICOMPARTMENTAL KNEE (Left) Foley d/c'ed Advance diet Up with therapy D/C IV fluids Discharge home with home health Follow up in 2 weeks at [facility]. Follow up with OLIN,Addilee Neu D in 2 weeks.  Contact information:  [facility] [address] [place] [address] [phone]    Obese (BMI 30-39.9) Estimated body mass index is 34.11 kg/(m^2) as calculated from the following:   Height as of this encounter: 5' 11.5" (1.816 m).   Weight as of this encounter: 112.492 kg (248 lb). Patient also counseled that weight may inhibit the healing process Patient counseled that losing weight will help with future health issues       Anastasio Auerbach. Noha Karasik   PAC  02/06/2013, 7:28 AM

## 2013-02-06 NOTE — Progress Notes (Signed)
Utilization review completed.  

## 2013-02-06 NOTE — Progress Notes (Signed)
Received orders for rw and commode.  Patient already has rw at home.  Will deliver commode to room prior to d/c.

## 2013-02-06 NOTE — Care Management Note (Signed)
    Page 1 of 2   02/06/2013     3:17:18 PM   CARE MANAGEMENT NOTE 02/06/2013  Patient:  Jimmy Bradshaw, Jimmy Bradshaw   Account Number:  [id number]  Date Initiated:  02/06/2013  Documentation initiated by:  Colleen Can  Subjective/Objective Assessment:   dx left medial compartmental osteoarthritis; partial knee replacemnt.  Pre-arrasnged with Genevieve Norlander for [facility] services which will start tomorrow-02/07/2013     Action/Plan:   CM spoke with patient who plans to return to his home in [place] where where spouse will be caregiver.   Anticipated DC Date:  02/06/2013   Anticipated DC Plan:  HOME W HOME HEALTH SERVICES      DC Planning Services  CM consult      PAC Choice  DURABLE MEDICAL EQUIPMENT  HOME HEALTH   Choice offered to / List presented to:  C-1 Patient   DME arranged  3-N-1  CANE      DME agency  [facility]     HH arranged  HH-2 PT      [facility] agency  [facility]   Status of service:  Completed, signed off Medicare Important Message given?  NA - LOS <3 / Initial given by admissions (If response is "NO", the following Medicare IM given date fields will be blank) Date Medicare IM given:   Date Additional Medicare IM given:    Discharge Disposition:  HOME W HOME HEALTH SERVICES  Per UR Regulation:    If discussed at Long Length of Stay Meetings, dates discussed:    Comments:

## 2013-02-06 NOTE — Evaluation (Signed)
Physical Therapy Evaluation Patient Details Name: Jimmy Bradshaw MRN: [id number] DOB: Aug 15, 1946 Today's Date: 02/06/2013 Time: 0865-7846 PT Time Calculation (min): 28 min  PT Assessment / Plan / Recommendation Clinical Impression  Pt is a 67 year old male s/p L medial unicompartmental knee.  Pt educated in gait with RW and did very well so progressed to [facility].  Pt also performed stairs and exercises.  Pt feels ready for d/c home today and had no further questions/concerns regarding d/c.    PT Assessment  All further PT needs can be met in the next venue of care    Follow Up Recommendations  Home health PT    Does the patient have the potential to tolerate intense rehabilitation      Barriers to Discharge        Equipment Recommendations  Cane    Recommendations for Other Services     Frequency      Precautions / Restrictions Precautions Precautions: Knee Restrictions LLE Weight Bearing: Weight bearing as tolerated   Pertinent Vitals/Pain Pt reports good pain control.  Premedicated and RN gave another pain pill after session.  Ice packs applied to L knee     Mobility  Bed Mobility Bed Mobility: Not assessed Transfers Transfers: Sit to Stand;Stand to Sit Sit to Stand: 6: Modified independent (Device/Increase time);From chair/3-in-1;With upper extremity assist Stand to Sit: 6: Modified independent (Device/Increase time);To chair/3-in-1;With upper extremity assist Ambulation/Gait Ambulation/Gait Assistance: 5: Supervision;4: Min guard Ambulation Distance (Feet): 250 Feet Assistive device: Rolling walker Ambulation/Gait Assistance Details: 250 feet with RW, did well with RW so educated on gait using SPC Gait Pattern: Step-through pattern Stairs: Yes Stairs Assistance: 5: Supervision Stairs Assistance Details (indicate cue type and reason): step through ascending and step to descending with verbal cues for technique, sequence Stair Management Technique: Alternating  pattern;Step to pattern;One rail Right Number of Stairs: 4    Exercises Total Joint Exercises Ankle Circles/Pumps: AROM;Both;20 reps Quad Sets: AROM;Both;20 reps Short Arc Quad: AROM;Left;20 reps Heel Slides: AROM;Seated;Left;20 reps Hip ABduction/ADduction: AROM;Left;20 reps Straight Leg Raises: AROM;Left;15 reps   PT Diagnosis: Difficulty walking  PT Problem List: Decreased range of motion;Decreased strength;Decreased knowledge of use of DME PT Treatment Interventions:     PT Goals    Visit Information  Last PT Received On: 02/06/13 Assistance Needed: +1    Subjective Data  Subjective: I'm a dog groomer.   Prior Functioning  Home Living Lives With: Spouse Type of Home: House Home Access: Level entry Home Layout: Able to live on main level with bedroom/bathroom;Two level Alternate Level Stairs-Number of Steps: 15 Alternate Level Stairs-Rails: Right Home Adaptive Equipment: Walker - rolling Prior Function Level of Independence: Independent Communication Communication: No difficulties    Cognition  Cognition Arousal/Alertness: Awake/alert Behavior During Therapy: WFL for tasks assessed/performed Overall Cognitive Status: Within Functional Limits for tasks assessed    Extremity/Trunk Assessment Right Upper Extremity Assessment RUE ROM/Strength/Tone: WFL for tasks assessed Left Upper Extremity Assessment LUE ROM/Strength/Tone: WFL for tasks assessed Right Lower Extremity Assessment RLE ROM/Strength/Tone: WFL for tasks assessed Left Lower Extremity Assessment LLE ROM/Strength/Tone: Deficits LLE ROM/Strength/Tone Deficits: L knee active -5-100* sitting   Balance    End of Session PT - End of Session Activity Tolerance: Patient tolerated treatment well Patient left: in chair;with call bell/phone within reach  GP     [facility] E 02/06/2013, 9:32 AM Zenovia Jarred, PT, DPT 02/06/2013 Pager: [phone]

## 2013-02-07 NOTE — Discharge Summary (Signed)
Physician Discharge Summary  Patient ID: Jimmy Bradshaw MRN: [id number] DOB/AGE: 1946-01-22 67 y.o.  Admit date: 02/05/2013 Discharge date: 02/06/2013   Procedures:  Procedure(s) (LRB): LEFT KNEE MEDIAL UNICOMPARTMENTAL KNEE (Left)  Attending Physician:  Dr. 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 Hyperlipidemia   . Anxiety   . BPH (benign prostatic hyperplasia)   . Colon polyps   . H/O: gout   . Tingling     LEFT FOOT  . Arthritis     HPI: Jimmy Bradshaw, 67 y.o. male, has a history of pain and functional disability in the left knee due to arthritis and has failed non-surgical conservative treatments for greater than 12 weeks to includeNSAID's and/or analgesics, corticosteriod injections, viscosupplementation injections, activity modification and aspirations. Onset of symptoms was gradual, starting >10 years ago with gradually worsening course since that time. The patient noted prior procedures on the knee to include arthroscopy on the left knee(s). Patient currently rates pain in the left knee(s) at 7 out of 10 with activity. Patient has night pain, worsening of pain with activity and weight bearing, pain that interferes with activities of daily living, pain with passive range of motion, crepitus and joint swelling. Patient has evidence of periarticular osteophytes and joint space narrowing of the medial compartment by imaging studies. There is no active infection. Risks, benefits and expectations were discussed with the patient. Patient understand the risks, benefits and expectations and wishes to proceed with surgery.  PCP: Frazier Richards, PA-C   Discharged Condition: good  Hospital Course:  Patient underwent the above stated procedure on 02/05/2013. Patient tolerated  the procedure well and brought to the recovery room in good condition and subsequently to the floor.  POD #1 BP: 116/74 ; Pulse: 76 ; Temp: 97.6 F (36.4 C) ; Resp: 16  Pt's foley was removed. IV was changed to a saline lock. Patient reports pain as mild, pain well controlled. No events throughout the night. Ready to be discharged home. Neurovascular intact, dorsiflexion/plantar flexion intact, incision: dressing C/D/I, no cellulitis present and compartment soft.   LABS  Basename  02/06/13    0435   HGB  13.9  HCT  40.3    Discharge Exam: General appearance: alert, cooperative and no distress Extremities: Homans sign is negative, no sign of DVT, no edema, redness or tenderness in the calves or thighs and no ulcers, gangrene or trophic changes  Disposition:   Home-Health Care Svc with follow up in 2 weeks  Follow up in 2 weeks at [facility]. Follow up with OLIN,Ihor Meinzer D in 2 weeks.  Contact information:  [facility] [address] [place] [address] [phone]       Discharge Orders   Future Orders Complete By Expires     Call MD / Call 911  As directed     Comments:      If you experience chest pain or shortness of breath, CALL 911 and be transported to the hospital emergency room.  If you develope a fever above 101 F, pus (white drainage) or increased drainage or redness at the wound, or calf pain, call your surgeon's office.    Change dressing  As directed     Comments:      Maintain surgical dressing  for 10-14 days, then change the dressing daily with sterile 4 x 4 inch gauze dressing and tape. Keep the area dry and clean.    Constipation Prevention  As directed     Comments:      Drink plenty of fluids.  Prune juice may be helpful.  You may use a stool softener, such as Colace (over the counter) 100 mg twice a day.  Use MiraLax (over the counter) for constipation as needed.    Diet - low sodium heart healthy   As directed     Discharge instructions  As directed     Comments:      Maintain surgical dressing for 10-14 days, then replace with gauze and tape. Keep the area dry and clean until follow up. Follow up in 2 weeks at [facility]. Call with any questions or concerns.    Increase activity slowly as tolerated  As directed     TED hose  As directed     Comments:      Use stockings (TED hose) for 2 weeks on both leg(s).  You may remove them at night for sleeping.    Weight bearing as tolerated  As directed          Medication List    STOP taking these medications       aspirin 81 MG tablet      TAKE these medications       aspirin 325 MG EC tablet  Take 1 tablet (325 mg total) by mouth 2 (two) times daily.     buPROPion 300 MG 24 hr tablet  Commonly known as:  WELLBUTRIN XL  Take 300 mg by mouth every morning. prescibed by Dr Duard Brady [facility] provider)     DSS 100 MG Caps  Take 100 mg by mouth 2 (two) times daily.     ferrous sulfate 325 (65 FE) MG tablet  Take 1 tablet (325 mg total) by mouth 3 (three) times daily after meals.     HYDROcodone-acetaminophen 7.5-325 MG per tablet  Commonly known as:  NORCO  Take 1-2 tablets by mouth every 4 (four) hours.     multivitamin tablet  Take 1 tablet by mouth daily.     polyethylene glycol packet  Commonly known as:  MIRALAX / GLYCOLAX  Take 17 g by mouth 2 (two) times daily.     pravastatin 80 MG tablet  Commonly known as:  PRAVACHOL  Take 1 tablet (80 mg total) by mouth daily.     silodosin 8 MG Caps capsule  Commonly known as:  RAPAFLO  Take 8 mg by mouth daily with breakfast.     tiZANidine 4 MG capsule  Commonly known as:  ZANAFLEX  Take 1 capsule (4 mg total) by mouth 3 (three) times daily. Muscle spasms     VIIBRYD 20 MG Tabs  Generic drug:  Vilazodone HCl  Take 1 tablet by mouth every morning. prescibed br Dr Duard Brady ([facility] provider)         Signed: Anastasio Auerbach. Abdimalik Mayorquin   PAC  02/07/2013, 7:36 PM

## 2013-04-02 ENCOUNTER — Telehealth: Payer: Self-pay | Admitting: Physician Assistant

## 2013-04-02 DIAGNOSIS — E785 Hyperlipidemia, unspecified: Secondary | ICD-10-CM

## 2013-04-02 MED ORDER — PRAVASTATIN SODIUM 80 MG PO TABS: 80.0000 mg | ORAL_TABLET | Freq: Every day | ORAL | Status: DC

## 2013-04-02 NOTE — Telephone Encounter (Signed)
Medication refilled per protocol. 

## 2013-06-08 ENCOUNTER — Encounter: Payer: Self-pay | Admitting: Family Medicine

## 2013-06-08 ENCOUNTER — Ambulatory Visit (INDEPENDENT_AMBULATORY_CARE_PROVIDER_SITE_OTHER): Payer: Medicare Other | Admitting: Family Medicine

## 2013-06-08 VITALS — BP 140/90 | HR 78 | Temp 97.0°F | Resp 18 | Wt 246.0 lb

## 2013-06-08 DIAGNOSIS — M549 Dorsalgia, unspecified: Secondary | ICD-10-CM | POA: Insufficient documentation

## 2013-06-08 MED ORDER — NAPROXEN 500 MG PO TABS: 500.0000 mg | ORAL_TABLET | Freq: Two times a day (BID) | ORAL | Status: DC

## 2013-06-08 MED ORDER — OXYCODONE-ACETAMINOPHEN 5-325 MG PO TABS: 1.0000 | ORAL_TABLET | ORAL | Status: DC | PRN

## 2013-06-08 MED ORDER — CYCLOBENZAPRINE HCL 10 MG PO TABS: 10.0000 mg | ORAL_TABLET | Freq: Two times a day (BID) | ORAL | Status: DC | PRN

## 2013-06-08 NOTE — Assessment & Plan Note (Addendum)
MSK back pain, no radiating symptoms, no red flags on exam Given naprosyn BID with food Flexeril for bedtime Short course of percocet, unable to take hydrocodone If no improvement plain film of l spine

## 2013-06-08 NOTE — Patient Instructions (Signed)
Take naprosyn twice a day with food Muscle relaxant at bedtime Pain medication given Use heating pad and stretch  F/U as needed

## 2013-06-08 NOTE — Progress Notes (Signed)
  Subjective:    Patient ID: JIVAN SYMANSKI, male    DOB: 1946/05/13, 67 y.o.   MRN: [id number]  HPI  Back pain x 36 hours. Wed started with headache, cough and URI symptoms, Thursday woke up with severe lower back pain, no specific injury. Denies radiating symptoms. No change in bowel or bladder. Took his last pain pill was given for his recent knee surgery, also took last muscle relaxer given at knee surgery.   Review of Systems  GEN- denies fatigue, fever, weight loss,weakness, recent illness HEENT- denies eye drainage, change in vision, nasal discharge, CVS- denies chest pain, palpitations RESP- denies SOB, cough, wheeze MSK- + joint pain, muscle aches, injury Neuro- denies headache, dizziness, syncope, seizure activity      Objective:   Physical Exam  GEN- NAD, alert and oriented x3 HEENT- PERRL, EOMI, non injected sclera, pink conjunctiva, MMM, oropharynx clear,  Neck- Supple, no LAD CVS- RRR, no murmur RESP-CTAB Back- Mild ttp lumbar region, +spasm, pain with flexion and extension, able to squat, able to walk on toes, neg SLR Neuro- normal tone LE, sensation, motor in tact and equal bilat, DTR equal bilat EXT- No edema Pulses- Radial 2+         Assessment & Plan:

## 2013-07-23 ENCOUNTER — Telehealth: Payer: Self-pay | Admitting: Physician Assistant

## 2013-07-23 ENCOUNTER — Encounter: Payer: Self-pay | Admitting: Family Medicine

## 2013-07-23 DIAGNOSIS — E785 Hyperlipidemia, unspecified: Secondary | ICD-10-CM

## 2013-07-23 MED ORDER — PRAVASTATIN SODIUM 80 MG PO TABS: 80.0000 mg | ORAL_TABLET | Freq: Every day | ORAL | Status: DC

## 2013-07-23 NOTE — Telephone Encounter (Signed)
Pravastatin 80mg  take one tablet every day # 90

## 2013-07-23 NOTE — Addendum Note (Signed)
Addended by: Donne Anon on: 07/23/2013 12:00 PM   Modules accepted: Orders

## 2013-07-23 NOTE — Telephone Encounter (Signed)
Medication refill for one time only.  Patient needs to be seen.  Letter sent for patient to call and schedule. Due routine office visit and lab work

## 2013-08-24 ENCOUNTER — Ambulatory Visit: Payer: Medicare Other | Admitting: Family Medicine

## 2013-09-28 ENCOUNTER — Other Ambulatory Visit: Payer: Self-pay | Admitting: Family Medicine

## 2013-09-28 ENCOUNTER — Encounter: Payer: Self-pay | Admitting: Family Medicine

## 2013-09-28 ENCOUNTER — Ambulatory Visit (INDEPENDENT_AMBULATORY_CARE_PROVIDER_SITE_OTHER): Payer: Medicare HMO | Admitting: Family Medicine

## 2013-09-28 VITALS — BP 136/86 | HR 80 | Temp 97.6°F | Resp 18 | Ht 71.0 in | Wt 250.0 lb

## 2013-09-28 DIAGNOSIS — N400 Enlarged prostate without lower urinary tract symptoms: Secondary | ICD-10-CM

## 2013-09-28 DIAGNOSIS — Z23 Encounter for immunization: Secondary | ICD-10-CM

## 2013-09-28 DIAGNOSIS — E669 Obesity, unspecified: Secondary | ICD-10-CM

## 2013-09-28 DIAGNOSIS — E785 Hyperlipidemia, unspecified: Secondary | ICD-10-CM

## 2013-09-28 LAB — CBC WITH DIFFERENTIAL/PLATELET
BASOS ABS: 0 10*3/uL (ref 0.0–0.1)
Basophils Relative: 0 % (ref 0–1)
EOS ABS: 0.2 10*3/uL (ref 0.0–0.7)
EOS PCT: 3 % (ref 0–5)
HCT: 43 % (ref 39.0–52.0)
Hemoglobin: 14.9 g/dL (ref 13.0–17.0)
Lymphocytes Relative: 25 % (ref 12–46)
Lymphs Abs: 1.7 10*3/uL (ref 0.7–4.0)
MCH: 30.1 pg (ref 26.0–34.0)
MCHC: 34.7 g/dL (ref 30.0–36.0)
MCV: 86.9 fL (ref 78.0–100.0)
Monocytes Absolute: 0.6 10*3/uL (ref 0.1–1.0)
Monocytes Relative: 8 % (ref 3–12)
Neutro Abs: 4.4 10*3/uL (ref 1.7–7.7)
Neutrophils Relative %: 64 % (ref 43–77)
PLATELETS: 236 10*3/uL (ref 150–400)
RBC: 4.95 MIL/uL (ref 4.22–5.81)
RDW: 14 % (ref 11.5–15.5)
WBC: 7 10*3/uL (ref 4.0–10.5)

## 2013-09-28 LAB — LIPID PANEL
CHOL/HDL RATIO: 7.4 ratio
CHOLESTEROL: 193 mg/dL (ref 0–200)
HDL: 26 mg/dL — AB (ref >39)
LDL Cholesterol: 121 mg/dL — ABNORMAL HIGH (ref 0–99)
TRIGLYCERIDES: 230 mg/dL — AB (ref <150)
VLDL: 46 mg/dL — ABNORMAL HIGH (ref 0–40)

## 2013-09-28 LAB — COMPREHENSIVE METABOLIC PANEL
ALT: 18 U/L (ref 0–53)
AST: 17 U/L (ref 0–37)
Albumin: 4.1 g/dL (ref 3.5–5.2)
Alkaline Phosphatase: 66 U/L (ref 39–117)
BUN: 21 mg/dL (ref 6–23)
CALCIUM: 9.5 mg/dL (ref 8.4–10.5)
CHLORIDE: 105 meq/L (ref 96–112)
CO2: 23 mEq/L (ref 19–32)
CREATININE: 0.95 mg/dL (ref 0.50–1.35)
GLUCOSE: 149 mg/dL — AB (ref 70–99)
Potassium: 4.4 mEq/L (ref 3.5–5.3)
Sodium: 139 mEq/L (ref 135–145)
Total Bilirubin: 0.3 mg/dL (ref 0.3–1.2)
Total Protein: 6.6 g/dL (ref 6.0–8.3)

## 2013-09-28 NOTE — Assessment & Plan Note (Addendum)
He is gaining some weight of the past couple months. He's not been watching his diet. Importance of healthy eating and weight loss area  We will continue to monitor his blood pressures well. His diastolic is a little elevated

## 2013-09-28 NOTE — Assessment & Plan Note (Signed)
Would check fasting lipid panel as well as his metabolic panel and CBC. We'll then decide on dose of statin drug. Discussed diet and exercise

## 2013-09-28 NOTE — Assessment & Plan Note (Signed)
Followed by urology I will defer PSA testing to them

## 2013-09-28 NOTE — Patient Instructions (Signed)
Prevnar 13 given We will call with lab results and medications F/U 4 months

## 2013-09-28 NOTE — Progress Notes (Signed)
   Subjective:    Patient ID: Jimmy Bradshaw, male    DOB: 1946/04/17, 68 y.o.   MRN: [id number]  HPI  Patient here to follow chronic medical problems. He has no specific concerns today. He ran out of his pravastatin November and has not had any labs or followup since then. He is taking his Wellbutrin which is prescribed by his psychiatrist as well as his wrap of flow which is prescribed by his urologist. He no longer cars any pain medications for his knee or back.  Of note he is also being followed by podiatry for a left heel cyst as well as a burning sensation on the heel   Review of Systems  GEN- denies fatigue, fever, weight loss,weakness, recent illness HEENT- denies eye drainage, change in vision, nasal discharge, CVS- denies chest pain, palpitations RESP- denies SOB, cough, wheeze ABD- denies N/V, change in stools, abd pain GU- denies dysuria, hematuria, dribbling, incontinence MSK- denies joint pain, muscle aches, injury Neuro- denies headache, dizziness, syncope, seizure activity      Objective:   Physical Exam GEN- NAD, alert and oriented x3, obese HEENT- PERRL, EOMI, non injected sclera, pink conjunctiva, MMM, oropharynx clear Neck- Supple,  CVS- RRR, no murmur RESP-CTAB EXT- No edema Pulses- Radial 2+        Assessment & Plan:

## 2013-10-02 LAB — HEMOGLOBIN A1C
Hgb A1c MFr Bld: 7.5 % — ABNORMAL HIGH (ref <5.7)
Mean Plasma Glucose: 169 mg/dL — ABNORMAL HIGH (ref <117)

## 2013-10-09 ENCOUNTER — Ambulatory Visit: Payer: Self-pay | Admitting: Family Medicine

## 2013-10-16 ENCOUNTER — Ambulatory Visit (INDEPENDENT_AMBULATORY_CARE_PROVIDER_SITE_OTHER): Payer: Medicare HMO | Admitting: Family Medicine

## 2013-10-16 ENCOUNTER — Encounter: Payer: Self-pay | Admitting: Family Medicine

## 2013-10-16 VITALS — BP 130/90 | HR 82 | Temp 97.8°F | Resp 20 | Ht 71.0 in | Wt 252.0 lb

## 2013-10-16 DIAGNOSIS — E785 Hyperlipidemia, unspecified: Secondary | ICD-10-CM

## 2013-10-16 DIAGNOSIS — E1140 Type 2 diabetes mellitus with diabetic neuropathy, unspecified: Secondary | ICD-10-CM | POA: Insufficient documentation

## 2013-10-16 DIAGNOSIS — E119 Type 2 diabetes mellitus without complications: Secondary | ICD-10-CM

## 2013-10-16 MED ORDER — ATORVASTATIN CALCIUM 20 MG PO TABS: 20.0000 mg | ORAL_TABLET | Freq: Every day | ORAL | Status: DC

## 2013-10-16 MED ORDER — METFORMIN HCL ER 500 MG PO TB24: 500.0000 mg | ORAL_TABLET | Freq: Every day | ORAL | Status: DC

## 2013-10-16 NOTE — Assessment & Plan Note (Signed)
New onset diabetes mellitus he's had some elevated blood sugars in the past. His A1c is 7.5% we discussed dietary changes in detail he was also shown how to use a glucometer. We'll start metformin extended release one tablet day He will return in 6 weeks at that time I will get urine microalbumin and start him on an ACE inhibitor He is are ready on a statin drug as well as an aspirin a did not want to overwhelm him with multiple medications today

## 2013-10-16 NOTE — Progress Notes (Signed)
   Subjective:    Patient ID: Jimmy Bradshaw, male    DOB: October 21, 1945, 68 y.o.   MRN: [id number]  HPI Patient here to followup abnormal labs. His blood sugars have been elevated A1c was obtained which was abnormal at 7.5%. His triglycerides were also elevated at 230 in his LDL was elevated greater than 100. Otherwise his metabolic panel and CBC were normal  His wife noted that he does complain of burning in his feet at times as well as headache on and off. His diet is very poor he eats a lot of fast food as well as feet from the local gas station on a regular basis. He also eats donuts almost every morning.  Review of Systems - per above     Objective:   Physical Exam  GEN-NAD,alert and oriented x 3      Assessment & Plan:

## 2013-10-16 NOTE — Assessment & Plan Note (Signed)
Change from pravastatin to lipitor

## 2013-10-16 NOTE — Patient Instructions (Signed)
Check blood sugar once a day in the morning fasting Take metformin once a day with dinner New cholesterol medication generic lipitor Take baby aspirin F/U 6 weeks- bring meter and medications

## 2013-10-17 ENCOUNTER — Telehealth: Payer: Self-pay | Admitting: *Deleted

## 2013-10-17 NOTE — Telephone Encounter (Signed)
This does happen in the first few days, also a little diarrhea Take with a meal and continue

## 2013-10-18 ENCOUNTER — Telehealth: Payer: Self-pay | Admitting: Physician Assistant

## 2013-10-18 DIAGNOSIS — E785 Hyperlipidemia, unspecified: Secondary | ICD-10-CM

## 2013-10-18 DIAGNOSIS — E119 Type 2 diabetes mellitus without complications: Secondary | ICD-10-CM

## 2013-10-18 MED ORDER — METFORMIN HCL ER 500 MG PO TB24: 500.0000 mg | ORAL_TABLET | Freq: Every day | ORAL | Status: DC

## 2013-10-18 MED ORDER — ATORVASTATIN CALCIUM 20 MG PO TABS: 20.0000 mg | ORAL_TABLET | Freq: Every day | ORAL | Status: DC

## 2013-10-18 NOTE — Telephone Encounter (Signed)
Pt came into office wanting HELP on his BS machine, showed him eductated him and aware of results

## 2013-10-18 NOTE — Telephone Encounter (Signed)
Pt called and states that all of his medications were sent to the wrong mail order. It was suppose to be sent to [facility] He would like a call once this has been taken care of  Call back number is [phone]

## 2013-10-18 NOTE — Telephone Encounter (Signed)
Done, pt called.

## 2013-10-29 ENCOUNTER — Telehealth: Payer: Self-pay | Admitting: Family Medicine

## 2013-10-29 NOTE — Telephone Encounter (Signed)
Please call him regarding his cholesterol medicine.  After 4 days he had severe muscle pain in arms and legs Stopped taking the med.  PLease call him

## 2013-10-30 NOTE — Telephone Encounter (Signed)
Have him try Pravastatin 40mg  once a day, see if this can be sent locally, send in 30 day supply to make sure he tolerates it. Tell him this is a weaker statin with less muscle pains

## 2013-10-30 NOTE — Telephone Encounter (Signed)
Pt called back and stated that he had been taking Lipitor for 4days after the 4th day he had some severe body aches, muscle aches he has stopped taking the meds and no longer has the aches, wants to know if there is something else you can prescribe to him.

## 2013-10-31 MED ORDER — PRAVASTATIN SODIUM 40 MG PO TABS: 40.0000 mg | ORAL_TABLET | Freq: Every day | ORAL | Status: DC

## 2013-10-31 NOTE — Telephone Encounter (Signed)
Pt states has tried pravastatin before and can tolerate it and wants to do 90 day supply instead of 30day

## 2013-11-02 ENCOUNTER — Other Ambulatory Visit: Payer: Self-pay | Admitting: *Deleted

## 2013-11-08 ENCOUNTER — Telehealth: Payer: Self-pay | Admitting: *Deleted

## 2013-11-08 NOTE — Telephone Encounter (Signed)
Pt called stating that his pharmacy [facility] has cancelled his order stating that he was on two statins that they could not send until doctor oks, I call the pharmacy and they stated they sent [place] something in reference to the two staitin and never heard back so cancelled his order, we never received anything about this. I told pharmacist he is to be on Pravastatin only and not atorvastatin and they will take care of it and pravastatin will be sent to pts address in about a week.

## 2013-11-27 ENCOUNTER — Encounter: Payer: Self-pay | Admitting: Family Medicine

## 2013-11-27 ENCOUNTER — Ambulatory Visit (INDEPENDENT_AMBULATORY_CARE_PROVIDER_SITE_OTHER): Payer: Medicare HMO | Admitting: Family Medicine

## 2013-11-27 VITALS — BP 136/82 | HR 64 | Temp 97.7°F | Resp 14 | Ht 70.0 in | Wt 234.0 lb

## 2013-11-27 DIAGNOSIS — M109 Gout, unspecified: Secondary | ICD-10-CM

## 2013-11-27 DIAGNOSIS — E785 Hyperlipidemia, unspecified: Secondary | ICD-10-CM

## 2013-11-27 DIAGNOSIS — E66811 Obesity, class 1: Secondary | ICD-10-CM

## 2013-11-27 DIAGNOSIS — E119 Type 2 diabetes mellitus without complications: Secondary | ICD-10-CM

## 2013-11-27 DIAGNOSIS — E669 Obesity, unspecified: Secondary | ICD-10-CM

## 2013-11-27 LAB — MICROALBUMIN / CREATININE URINE RATIO
Creatinine, Urine: 349.4 mg/dL
Microalb Creat Ratio: 11.2 mg/g (ref 0.0–30.0)
Microalb, Ur: 3.91 mg/dL — ABNORMAL HIGH (ref 0.00–1.89)

## 2013-11-27 MED ORDER — METFORMIN HCL ER 500 MG PO TB24: 500.0000 mg | ORAL_TABLET | Freq: Every day | ORAL | Status: DC

## 2013-11-27 MED ORDER — BUPROPION HCL ER (XL) 300 MG PO TB24: 300.0000 mg | ORAL_TABLET | Freq: Every morning | ORAL | Status: DC

## 2013-11-27 MED ORDER — PRAVASTATIN SODIUM 40 MG PO TABS: 80.0000 mg | ORAL_TABLET | Freq: Every day | ORAL | Status: DC

## 2013-11-27 MED ORDER — LISINOPRIL 2.5 MG PO TABS: 2.5000 mg | ORAL_TABLET | Freq: Every day | ORAL | Status: DC

## 2013-11-27 MED ORDER — INDOMETHACIN 50 MG PO CAPS: 50.0000 mg | ORAL_CAPSULE | Freq: Three times a day (TID) | ORAL | Status: DC

## 2013-11-27 NOTE — Assessment & Plan Note (Signed)
Plan to recheck FLP in 4 weeks on statin drug

## 2013-11-27 NOTE — Patient Instructions (Signed)
Start lisinopril once a day for kidney protection Indocin three times a day for gout flare for 5 days Continue metformin Congratulations on the weight loss! Return in 4 weeks to have labs drawn fasting F/U 3 months in Office

## 2013-11-27 NOTE — Progress Notes (Signed)
Patient ID: Jimmy Bradshaw, male   DOB: [birth year], 68 y.o.   MRN: [id number]   Subjective:    Patient ID: Jimmy Bradshaw, male    DOB: 1945-11-04, 68 y.o.   MRN: [id number]  Patient presents for 6 week F/U  patient here for interim followup on diabetes mellitus. His blood sugars have been post dinner 110-163, fasting 93-142 He states that he feels well. He has lost 20 pounds since her last visit I changed his diet and decreasing his sugar intake.  He does complain of gout which she's had on and off. He had some leftover indomethacin that he used to recently for gout flare in his heel and he would like a refill on this. He was on allopurinol in the past but did not do well with this  He is currently on Wellbutrin he was being followed by psychiatry he needs this medication refills as he does not have an upcoming appointment    Review Of Systems:  GEN- denies fatigue, fever, weight loss,weakness, recent illness HEENT- denies eye drainage, change in vision, nasal discharge, CVS- denies chest pain, palpitations RESP- denies SOB, cough, wheeze MSK- +joint pain, muscle aches, injury Neuro- denies headache, dizziness, syncope, seizure activity       Objective:    BP 136/82  Pulse 64  Temp(Src) 97.7 F (36.5 C)  Resp 14  Ht 5\' 10"  (1.778 m)  Wt 234 lb (106.142 kg)  BMI 33.58 kg/m2 GEN- NAD, alert and oriented x3 HEENT- PERRL, EOMI, non injected sclera, pink conjunctiva, MMM, oropharynx clear CVS- RRR, no murmur RESP-CTAB EXT- No edema MSK- mild TTP left heel, no erythema, no swelling  Pulses- Radial, DP- 2+        Assessment & Plan:      Problem List Items Addressed This Visit   Type II or unspecified type diabetes mellitus without mention of complication, not stated as uncontrolled - Primary     Doing well on MTF, CBG much improved Check urine micro ACE lisinopril 2.5mg  added    Relevant Medications      lisinopril (PRINIVIL,ZESTRIL) tablet      metFORMIN  (GLUCOPHAGE-XR) 24 hr tablet      pravastatin (PRAVACHOL) tablet   Other Relevant Orders      Hemoglobin A1c      Comprehensive metabolic panel      Microalbumin / creatinine urine ratio      Lipid panel      CBC with Differential    Other Visit Diagnoses   DM (diabetes mellitus)           Note: This dictation was prepared with Dragon dictation along with smaller phrase technology. Any transcriptional errors that result from this process are unintentional.

## 2013-11-27 NOTE — Assessment & Plan Note (Signed)
Indocin TID x 5 days, as needed

## 2013-11-27 NOTE — Assessment & Plan Note (Signed)
Weight loss noted, continue to work on diet

## 2013-11-27 NOTE — Assessment & Plan Note (Signed)
Doing well on MTF, CBG much improved Check urine micro ACE lisinopril 2.5mg  added

## 2013-11-29 ENCOUNTER — Encounter: Payer: Self-pay | Admitting: *Deleted

## 2013-12-14 ENCOUNTER — Other Ambulatory Visit: Payer: Self-pay | Admitting: Family Medicine

## 2013-12-17 NOTE — Telephone Encounter (Signed)
Refill appropriate and filled per protocol. 

## 2013-12-24 ENCOUNTER — Telehealth: Payer: Self-pay | Admitting: *Deleted

## 2013-12-24 NOTE — Telephone Encounter (Signed)
Received fax requesting PA for Indomethacin.   PA submitted.

## 2013-12-24 NOTE — Telephone Encounter (Signed)
Received PA determination.   PA denied.   MD made aware.

## 2013-12-25 ENCOUNTER — Telehealth: Payer: Self-pay | Admitting: *Deleted

## 2013-12-25 NOTE — Telephone Encounter (Signed)
MD aware of PA denial.   New information obtained about therapeutic failures and PA re-submitted.

## 2013-12-26 NOTE — Telephone Encounter (Signed)
New PA denied.   MD made aware.

## 2014-01-28 ENCOUNTER — Telehealth: Payer: Self-pay | Admitting: *Deleted

## 2014-01-28 NOTE — Telephone Encounter (Signed)
Received authorization from [place] approval number [id number] going to [place]. [facility] referral will be faxed to the facility.

## 2014-02-23 ENCOUNTER — Other Ambulatory Visit: Payer: Self-pay | Admitting: Family Medicine

## 2014-02-25 ENCOUNTER — Other Ambulatory Visit: Payer: Medicare HMO

## 2014-02-25 DIAGNOSIS — E119 Type 2 diabetes mellitus without complications: Secondary | ICD-10-CM

## 2014-02-25 LAB — CBC WITH DIFFERENTIAL/PLATELET
BASOS PCT: 1 % (ref 0–1)
Basophils Absolute: 0.1 10*3/uL (ref 0.0–0.1)
EOS ABS: 0.2 10*3/uL (ref 0.0–0.7)
Eosinophils Relative: 2 % (ref 0–5)
HCT: 42.9 % (ref 39.0–52.0)
Hemoglobin: 14.5 g/dL (ref 13.0–17.0)
LYMPHS ABS: 2 10*3/uL (ref 0.7–4.0)
Lymphocytes Relative: 27 % (ref 12–46)
MCH: 30 pg (ref 26.0–34.0)
MCHC: 33.8 g/dL (ref 30.0–36.0)
MCV: 88.8 fL (ref 78.0–100.0)
Monocytes Absolute: 0.7 10*3/uL (ref 0.1–1.0)
Monocytes Relative: 9 % (ref 3–12)
NEUTROS ABS: 4.6 10*3/uL (ref 1.7–7.7)
NEUTROS PCT: 61 % (ref 43–77)
Platelets: 254 10*3/uL (ref 150–400)
RBC: 4.83 MIL/uL (ref 4.22–5.81)
RDW: 14.2 % (ref 11.5–15.5)
WBC: 7.5 10*3/uL (ref 4.0–10.5)

## 2014-02-25 LAB — COMPREHENSIVE METABOLIC PANEL
ALT: 20 U/L (ref 0–53)
AST: 16 U/L (ref 0–37)
Albumin: 4.2 g/dL (ref 3.5–5.2)
Alkaline Phosphatase: 53 U/L (ref 39–117)
BUN: 21 mg/dL (ref 6–23)
CO2: 27 meq/L (ref 19–32)
CREATININE: 0.94 mg/dL (ref 0.50–1.35)
Calcium: 9.4 mg/dL (ref 8.4–10.5)
Chloride: 105 mEq/L (ref 96–112)
Glucose, Bld: 125 mg/dL — ABNORMAL HIGH (ref 70–99)
Potassium: 4.7 mEq/L (ref 3.5–5.3)
Sodium: 140 mEq/L (ref 135–145)
Total Bilirubin: 0.5 mg/dL (ref 0.2–1.2)
Total Protein: 6.9 g/dL (ref 6.0–8.3)

## 2014-02-25 LAB — HEMOGLOBIN A1C
HEMOGLOBIN A1C: 6.4 % — AB (ref <5.7)
MEAN PLASMA GLUCOSE: 137 mg/dL — AB (ref <117)

## 2014-02-25 LAB — LIPID PANEL
CHOL/HDL RATIO: 4.1 ratio
CHOLESTEROL: 154 mg/dL (ref 0–200)
HDL: 38 mg/dL — ABNORMAL LOW (ref >39)
LDL Cholesterol: 97 mg/dL (ref 0–99)
Triglycerides: 94 mg/dL (ref <150)
VLDL: 19 mg/dL (ref 0–40)

## 2014-02-25 NOTE — Telephone Encounter (Signed)
Refill appropriate and filled per protocol. 

## 2014-02-26 ENCOUNTER — Ambulatory Visit: Payer: Medicare HMO | Admitting: Family Medicine

## 2014-02-27 ENCOUNTER — Ambulatory Visit (INDEPENDENT_AMBULATORY_CARE_PROVIDER_SITE_OTHER): Payer: Medicare HMO | Admitting: Family Medicine

## 2014-02-27 ENCOUNTER — Encounter: Payer: Self-pay | Admitting: Family Medicine

## 2014-02-27 VITALS — BP 126/64 | HR 64 | Temp 97.4°F | Resp 16 | Ht 70.5 in | Wt 229.0 lb

## 2014-02-27 DIAGNOSIS — E669 Obesity, unspecified: Secondary | ICD-10-CM

## 2014-02-27 DIAGNOSIS — E785 Hyperlipidemia, unspecified: Secondary | ICD-10-CM

## 2014-02-27 DIAGNOSIS — E119 Type 2 diabetes mellitus without complications: Secondary | ICD-10-CM

## 2014-02-27 NOTE — Progress Notes (Signed)
Patient ID: Jimmy Bradshaw, male   DOB: 02/19/1946, 68 y.o.   MRN: [id number]   Subjective:    Patient ID: Jimmy Bradshaw, male    DOB: Feb 20, 1946, 68 y.o.   MRN: [id number]  Patient presents for 3 month F/U and R foot Plantar Wart  patient here follow chronic medical problems. He has history of diabetes mellitus hyperlipidemia. He had a recent A1c which came back at 6.4% he is taking metformin daily as prescribed as well as lisinopril and statin drug. His blood sugars at home are 90-120 fasting he has not had any hypoglycemia  He is being followed by podiatry secondary to a plantar wart which is been present for the past 2 months. They're using a topical medication to the wart it is improving his followup this Friday.    Review Of Systems:  GEN- denies fatigue, fever, weight loss,weakness, recent illness HEENT- denies eye drainage, change in vision, nasal discharge, CVS- denies chest pain, palpitations RESP- denies SOB, cough, wheeze ABD- denies N/V, change in stools, abd pain GU- denies dysuria, hematuria, dribbling, incontinence MSK- denies joint pain, muscle aches, injury Neuro- denies headache, dizziness, syncope, seizure activity       Objective:    BP 126/64  Pulse 64  Temp(Src) 97.4 F (36.3 C) (Oral)  Resp 16  Ht 5' 10.5" (1.791 m)  Wt 229 lb (103.874 kg)  BMI 32.38 kg/m2 GEN- NAD, alert and oriented x3 HEENT- PERRL, EOMI, non injected sclera, pink conjunctiva, MMM, oropharynx clear CVS- RRR, no murmur RESP-CTAB Skin- RIght foot- small plantar wart, mild callus surrounding, no erythema, mild TTP EXT- No edema Pulses- Radial, DP- 2+        Assessment & Plan:      Problem List Items Addressed This Visit   Type II or unspecified type diabetes mellitus without mention of complication, not stated as uncontrolled     A1C looks great, with his weight loss and diet changes I anticipate at next visit we may be able to take him off the Metformin    Obesity (BMI  30.0-34.9) - Primary   Hyperlipidemia      Note: This dictation was prepared with Dragon dictation along with smaller phrase technology. Any transcriptional errors that result from this process are unintentional.

## 2014-02-27 NOTE — Assessment & Plan Note (Signed)
A1C looks great, with his weight loss and diet changes I anticipate at next visit we may be able to take him off the Metformin

## 2014-02-27 NOTE — Assessment & Plan Note (Signed)
His LDL is at goal now we will continue her pravastatin 80 mg

## 2014-02-27 NOTE — Patient Instructions (Signed)
Continue current medications F/U 3 months- PHYSICAL

## 2014-02-27 NOTE — Assessment & Plan Note (Signed)
He continues to lose weight intentionally to try to improve his overall health. I think we'll be able to back on some of his medications with his weight loss change

## 2014-04-17 ENCOUNTER — Telehealth: Payer: Self-pay | Admitting: Family Medicine

## 2014-04-17 NOTE — Telephone Encounter (Signed)
PATIENT WOULD LIKE REFILL ON HIS PRAVASTATIN IF POSSIBLE   [facility]  PHONE IS [phone] AND FAX IS [phone]

## 2014-04-18 MED ORDER — PRAVASTATIN SODIUM 40 MG PO TABS: 80.0000 mg | ORAL_TABLET | Freq: Every day | ORAL | Status: DC

## 2014-04-18 NOTE — Telephone Encounter (Signed)
Prescription sent to pharmacy.

## 2014-05-31 ENCOUNTER — Encounter: Payer: Medicare HMO | Admitting: Family Medicine

## 2014-06-19 ENCOUNTER — Telehealth: Payer: Self-pay | Admitting: *Deleted

## 2014-06-19 ENCOUNTER — Ambulatory Visit (INDEPENDENT_AMBULATORY_CARE_PROVIDER_SITE_OTHER): Payer: Medicare HMO | Admitting: *Deleted

## 2014-06-19 DIAGNOSIS — Z23 Encounter for immunization: Secondary | ICD-10-CM

## 2014-06-19 NOTE — Telephone Encounter (Signed)
Submitted referral thru [facility] for authorization to Dr. Risa Grill to [facility] urology with authorization number [id number], once receive hard copy will fax to [facility] urology

## 2014-06-19 NOTE — Telephone Encounter (Signed)
Message copied by Maureen Chatters on Wed Jun 19, 2014 10:36 AM ------      Message from: Devoria Glassing      Created: Thu Jun 13, 2014 12:04 PM       Oct 13th is appointment with dr Risa Grill at [facility] urology needs [facility] referral for this please        ------

## 2014-07-11 ENCOUNTER — Other Ambulatory Visit: Payer: Self-pay | Admitting: Family Medicine

## 2014-07-11 NOTE — Telephone Encounter (Signed)
Medication filled x1 with no refills.   Patient overdue for CPE.   Letter sent.

## 2014-07-19 ENCOUNTER — Other Ambulatory Visit: Payer: Self-pay | Admitting: Urology

## 2014-07-23 ENCOUNTER — Other Ambulatory Visit: Payer: Self-pay | Admitting: Family Medicine

## 2014-07-23 NOTE — Telephone Encounter (Signed)
Medication refilled per protocol. 

## 2014-08-12 ENCOUNTER — Other Ambulatory Visit: Payer: Medicare HMO

## 2014-08-12 DIAGNOSIS — Z0000 Encounter for general adult medical examination without abnormal findings: Secondary | ICD-10-CM

## 2014-08-12 DIAGNOSIS — E785 Hyperlipidemia, unspecified: Secondary | ICD-10-CM

## 2014-08-12 DIAGNOSIS — E119 Type 2 diabetes mellitus without complications: Secondary | ICD-10-CM

## 2014-08-12 LAB — CBC WITH DIFFERENTIAL/PLATELET
BASOS ABS: 0.1 10*3/uL (ref 0.0–0.1)
Basophils Relative: 1 % (ref 0–1)
EOS PCT: 2 % (ref 0–5)
Eosinophils Absolute: 0.1 10*3/uL (ref 0.0–0.7)
HCT: 42.7 % (ref 39.0–52.0)
Hemoglobin: 14.6 g/dL (ref 13.0–17.0)
Lymphocytes Relative: 27 % (ref 12–46)
Lymphs Abs: 1.9 10*3/uL (ref 0.7–4.0)
MCH: 30.3 pg (ref 26.0–34.0)
MCHC: 34.2 g/dL (ref 30.0–36.0)
MCV: 88.6 fL (ref 78.0–100.0)
MPV: 11 fL (ref 9.4–12.4)
Monocytes Absolute: 0.4 10*3/uL (ref 0.1–1.0)
Monocytes Relative: 6 % (ref 3–12)
Neutro Abs: 4.5 10*3/uL (ref 1.7–7.7)
Neutrophils Relative %: 64 % (ref 43–77)
PLATELETS: 248 10*3/uL (ref 150–400)
RBC: 4.82 MIL/uL (ref 4.22–5.81)
RDW: 14 % (ref 11.5–15.5)
WBC: 7.1 10*3/uL (ref 4.0–10.5)

## 2014-08-12 LAB — COMPLETE METABOLIC PANEL WITH GFR
ALT: 21 U/L (ref 0–53)
AST: 20 U/L (ref 0–37)
Albumin: 4.1 g/dL (ref 3.5–5.2)
Alkaline Phosphatase: 61 U/L (ref 39–117)
BILIRUBIN TOTAL: 0.7 mg/dL (ref 0.2–1.2)
BUN: 20 mg/dL (ref 6–23)
CO2: 24 meq/L (ref 19–32)
CREATININE: 1 mg/dL (ref 0.50–1.35)
Calcium: 9.4 mg/dL (ref 8.4–10.5)
Chloride: 106 mEq/L (ref 96–112)
GFR, EST NON AFRICAN AMERICAN: 78 mL/min
Glucose, Bld: 125 mg/dL — ABNORMAL HIGH (ref 70–99)
Potassium: 4.7 mEq/L (ref 3.5–5.3)
Sodium: 142 mEq/L (ref 135–145)
Total Protein: 6.5 g/dL (ref 6.0–8.3)

## 2014-08-12 LAB — LIPID PANEL
CHOLESTEROL: 139 mg/dL (ref 0–200)
HDL: 36 mg/dL — AB (ref >39)
LDL Cholesterol: 86 mg/dL (ref 0–99)
Total CHOL/HDL Ratio: 3.9 Ratio
Triglycerides: 87 mg/dL (ref <150)
VLDL: 17 mg/dL (ref 0–40)

## 2014-08-12 LAB — HEMOGLOBIN A1C
HEMOGLOBIN A1C: 6.3 % — AB (ref <5.7)
MEAN PLASMA GLUCOSE: 134 mg/dL — AB (ref <117)

## 2014-08-20 ENCOUNTER — Encounter: Payer: Self-pay | Admitting: Family Medicine

## 2014-08-20 ENCOUNTER — Ambulatory Visit (INDEPENDENT_AMBULATORY_CARE_PROVIDER_SITE_OTHER): Payer: Medicare HMO | Admitting: Family Medicine

## 2014-08-20 VITALS — BP 128/70 | HR 62 | Temp 98.5°F | Resp 14 | Ht 71.0 in | Wt 233.0 lb

## 2014-08-20 DIAGNOSIS — E669 Obesity, unspecified: Secondary | ICD-10-CM

## 2014-08-20 DIAGNOSIS — Z0000 Encounter for general adult medical examination without abnormal findings: Secondary | ICD-10-CM

## 2014-08-20 DIAGNOSIS — F419 Anxiety disorder, unspecified: Secondary | ICD-10-CM

## 2014-08-20 DIAGNOSIS — E119 Type 2 diabetes mellitus without complications: Secondary | ICD-10-CM

## 2014-08-20 NOTE — Progress Notes (Signed)
Patient ID: Jimmy Bradshaw, male   DOB: 06-Oct-1945, 68 y.o.   MRN: [id number]   Subjective:    Patient ID: Jimmy Bradshaw, male    DOB: October 19, 1945, 68 y.o.   MRN: [id number]  Patient presents for CPE  patient here for complete physical exam. He has no specific returns. He is still being followed by podiatry for plantar wart as well as a callus. There is been minimal improvement in either lesion. He is scheduled to have a TURP procedure on his prostate in January. His medications were reviewed his fasting labs are reviewed his A1c looks good at 6.3% he is on metformin 500 mg once a day his weight at home stay steady around 225 pounds. He has not had any hypoglycemia. His immunizations are up-to-date his colonoscopy is up-to-date.  Subjective:   Patient presents for Medicare Annual/Subsequent preventive examination.   Review Past Medical/Family/Social: per EMR   Risk Factors  Current exercise habits: walks Dietary issues discussed: yes  Cardiac risk factors: Obesity (BMI >= 30 kg/m2).   Depression Screen  (Note: if answer to either of the following is "Yes", a more complete depression screening is indicated)  Over the past two weeks, have you felt down, depressed or hopeless? No Over the past two weeks, have you felt little interest or pleasure in doing things? No Have you lost interest or pleasure in daily life? No Do you often feel hopeless? No Do you cry easily over simple problems? No   Activities of Daily Living  In your present state of health, do you have any difficulty performing the following activities?:  Driving? No  Managing money? No  Feeding yourself? No  Getting from bed to chair? No  Climbing a flight of stairs? No  Preparing food and eating?: No  Bathing or showering? No  Getting dressed: No  Getting to the toilet? No  Using the toilet:No  Moving around from place to place: No  In the past year have you fallen or had a near fall?:No  Are you sexually active?   yes Do you have more than one partner? No   Hearing Difficulties: YES- WEARS HEARING AIDS Do you often ask people to speak up or repeat themselves?y  Do you experience ringing or noises in your ears? y Do you have difficulty understanding soft or whispered voices? y Do you feel that you have a problem with memory? No Do you often misplace items? No  Do you feel safe at home? Yes  Cognitive Testing  Alert? Yes Normal Appearance?Yes  Oriented to person? Yes Place? Yes  Time? Yes  Recall of three objects? Yes  Can perform simple calculations? Yes  Displays appropriate judgment?Yes  Can read the correct time from a watch face?Yes   List the Names of Other Physician/Practitioners you currently use: Podiatry, Urology   Screening Tests / Date - ALL UTD Colonoscopy                     Zostavax  Influenza Vaccine  Tetanus/tdap    Assessment:    Annual wellness medicare exam   Plan:    During the course of the visit the patient was educated and counseled about appropriate screening and preventive services including:  All screening UTD Screen Neg for depression.   Diet review for nutrition referral? Yes ____ Not Indicated __x__  Patient Instructions (the written plan) was given to the patient.  Medicare Attestation  I have personally reviewed:  The patient's  medical and social history  Their use of alcohol, tobacco or illicit drugs  Their current medications and supplements  The patient's functional ability including ADLs,fall risks, home safety risks, cognitive, and hearing and visual impairment  Diet and physical activities  Evidence for depression or mood disorders  The patient's weight, height, BMI, and visual acuity have been recorded in the chart. I have made referrals, counseling, and provided education to the patient based on review of the above and I have provided the patient with a written personalized care plan for preventive services.       Review Of  Systems:  GEN- denies fatigue, fever, weight loss,weakness, recent illness HEENT- denies eye drainage, change in vision, nasal discharge, CVS- denies chest pain, palpitations RESP- denies SOB, cough, wheeze ABD- denies N/V, change in stools, abd pain GU- denies dysuria, hematuria, dribbling, incontinence MSK- denies joint pain, muscle aches, injury Neuro- denies headache, dizziness, syncope, seizure activity       Objective:    BP 128/70 mmHg  Pulse 62  Temp(Src) 98.5 F (36.9 C) (Oral)  Resp 14  Ht 5\' 11"  (1.803 m)  Wt 233 lb (105.688 kg)  BMI 32.51 kg/m2 GEN- NAD, alert and oriented x3 HEENT- PERRL, EOMI, non injected sclera, pink conjunctiva, MMM, oropharynx clear, nares clear, TM clear no effusion Neck- Supple, no bruit CVS- RRR, no murmur RESP-CTAB ABD-NABS,soft,NT,ND Skin- RIght foot- small plantar wart, mild callus surrounding,  Callus beneath right great toe, mild TTP no erythema, GU- deferred EXT- No edema Pulses- Radial, DP- 2+        Assessment & Plan:      Problem List Items Addressed This Visit    None      Note: This dictation was prepared with Dragon dictation along with smaller phrase technology. Any transcriptional errors that result from this process are unintentional.

## 2014-08-20 NOTE — Assessment & Plan Note (Signed)
A1c is well controlled I think he would do okay off of the metformin with the significant dietary changes. We will plan January 1 to stop the metformin after the holiday Will continue the pravastatin at the current dose

## 2014-08-20 NOTE — Assessment & Plan Note (Signed)
Maintained on wellbutrin

## 2014-08-20 NOTE — Patient Instructions (Signed)
Continue current medications Stop the Metformin in January  F/U April 2016

## 2014-09-24 ENCOUNTER — Other Ambulatory Visit: Payer: Self-pay | Admitting: Family Medicine

## 2014-09-26 ENCOUNTER — Encounter (HOSPITAL_BASED_OUTPATIENT_CLINIC_OR_DEPARTMENT_OTHER): Payer: Self-pay | Admitting: *Deleted

## 2014-09-26 NOTE — Progress Notes (Signed)
NPO AFTER MN. ARRIVE AT 1000. NEEDS ISTAT AND EKG.  REVIEWED [facility] GUIDELINES, WILL BRING MEDS.

## 2014-09-26 NOTE — Telephone Encounter (Signed)
Refill appropriate and filled per protocol. 

## 2014-09-30 ENCOUNTER — Encounter (HOSPITAL_BASED_OUTPATIENT_CLINIC_OR_DEPARTMENT_OTHER): Payer: Self-pay | Admitting: Anesthesiology

## 2014-09-30 ENCOUNTER — Encounter (HOSPITAL_BASED_OUTPATIENT_CLINIC_OR_DEPARTMENT_OTHER)
Admission: RE | Disposition: A | Discharge status: Home / Self Care | Payer: Self-pay | Source: Ambulatory Visit | Attending: Urology

## 2014-09-30 ENCOUNTER — Ambulatory Visit (HOSPITAL_BASED_OUTPATIENT_CLINIC_OR_DEPARTMENT_OTHER)
Admission: RE | Admit: 2014-09-30 | Discharge: 2014-10-01 | Disposition: A | Discharge status: Home / Self Care | Payer: Medicare HMO | Source: Ambulatory Visit | Attending: Urology | Admitting: Urology

## 2014-09-30 ENCOUNTER — Other Ambulatory Visit: Payer: Self-pay

## 2014-09-30 ENCOUNTER — Ambulatory Visit (HOSPITAL_BASED_OUTPATIENT_CLINIC_OR_DEPARTMENT_OTHER): Payer: Medicare HMO | Admitting: Anesthesiology

## 2014-09-30 DIAGNOSIS — Z7982 Long term (current) use of aspirin: Secondary | ICD-10-CM | POA: Diagnosis not present

## 2014-09-30 DIAGNOSIS — N320 Bladder-neck obstruction: Secondary | ICD-10-CM | POA: Diagnosis not present

## 2014-09-30 DIAGNOSIS — M109 Gout, unspecified: Secondary | ICD-10-CM | POA: Diagnosis not present

## 2014-09-30 DIAGNOSIS — E119 Type 2 diabetes mellitus without complications: Secondary | ICD-10-CM | POA: Diagnosis not present

## 2014-09-30 DIAGNOSIS — M19019 Primary osteoarthritis, unspecified shoulder: Secondary | ICD-10-CM | POA: Diagnosis not present

## 2014-09-30 DIAGNOSIS — N138 Other obstructive and reflux uropathy: Secondary | ICD-10-CM | POA: Diagnosis not present

## 2014-09-30 DIAGNOSIS — N401 Enlarged prostate with lower urinary tract symptoms: Secondary | ICD-10-CM | POA: Insufficient documentation

## 2014-09-30 DIAGNOSIS — E785 Hyperlipidemia, unspecified: Secondary | ICD-10-CM | POA: Diagnosis not present

## 2014-09-30 DIAGNOSIS — Z87891 Personal history of nicotine dependence: Secondary | ICD-10-CM | POA: Insufficient documentation

## 2014-09-30 DIAGNOSIS — N4289 Other specified disorders of prostate: Secondary | ICD-10-CM | POA: Diagnosis not present

## 2014-09-30 DIAGNOSIS — E669 Obesity, unspecified: Secondary | ICD-10-CM | POA: Insufficient documentation

## 2014-09-30 DIAGNOSIS — Z6832 Body mass index (BMI) 32.0-32.9, adult: Secondary | ICD-10-CM | POA: Insufficient documentation

## 2014-09-30 DIAGNOSIS — N400 Enlarged prostate without lower urinary tract symptoms: Secondary | ICD-10-CM | POA: Diagnosis not present

## 2014-09-30 HISTORY — DX: Personal history of colonic polyps: Z86.010

## 2014-09-30 HISTORY — DX: Urgency of urination: R39.15

## 2014-09-30 HISTORY — DX: Type 2 diabetes mellitus without complications: E11.9

## 2014-09-30 HISTORY — DX: Frequency of micturition: R35.0

## 2014-09-30 HISTORY — PX: TRANSURETHRAL RESECTION OF PROSTATE: SHX73

## 2014-09-30 HISTORY — DX: Benign prostatic hyperplasia without lower urinary tract symptoms: N40.0

## 2014-09-30 HISTORY — DX: Nocturia: R35.1

## 2014-09-30 HISTORY — DX: Presence of external hearing-aid: Z97.4

## 2014-09-30 HISTORY — DX: Personal history of colon polyps, unspecified: Z86.0100

## 2014-09-30 HISTORY — DX: Bladder-neck obstruction: N32.0

## 2014-09-30 LAB — POCT I-STAT, CHEM 8
BUN: 19 mg/dL (ref 6–23)
CREATININE: 1 mg/dL (ref 0.50–1.35)
Calcium, Ion: 1.23 mmol/L (ref 1.13–1.30)
Chloride: 105 mEq/L (ref 96–112)
Glucose, Bld: 123 mg/dL — ABNORMAL HIGH (ref 70–99)
HCT: 43 % (ref 39.0–52.0)
Hemoglobin: 14.6 g/dL (ref 13.0–17.0)
POTASSIUM: 4.2 mmol/L (ref 3.5–5.1)
SODIUM: 141 mmol/L (ref 135–145)
TCO2: 24 mmol/L (ref 0–100)

## 2014-09-30 LAB — GLUCOSE, CAPILLARY
Glucose-Capillary: 124 mg/dL — ABNORMAL HIGH (ref 70–99)
Glucose-Capillary: 171 mg/dL — ABNORMAL HIGH (ref 70–99)

## 2014-09-30 SURGERY — TRANSURETHRAL RESECTION OF THE PROSTATE WITH GYRUS INSTRUMENTS
Anesthesia: General | Site: Prostate

## 2014-09-30 MED ORDER — SODIUM CHLORIDE 0.9 % IR SOLN
Status: DC | PRN
  Administered 2014-09-30: 6500 mL
  Administered 2014-09-30: 3000 mL

## 2014-09-30 MED ORDER — OXYBUTYNIN CHLORIDE 5 MG PO TABS
5.0000 mg | ORAL_TABLET | Freq: Three times a day (TID) | ORAL | Status: DC | PRN
  Filled 2014-09-30: qty 1

## 2014-09-30 MED ORDER — BELLADONNA ALKALOIDS-OPIUM 16.2-60 MG RE SUPP
RECTAL | Status: AC
  Filled 2014-09-30: qty 1

## 2014-09-30 MED ORDER — MORPHINE SULFATE 2 MG/ML IJ SOLN
2.0000 mg | INTRAMUSCULAR | Status: DC | PRN
  Filled 2014-09-30: qty 2

## 2014-09-30 MED ORDER — LIDOCAINE HCL (CARDIAC) 20 MG/ML IV SOLN
INTRAVENOUS | Status: DC | PRN
  Administered 2014-09-30: 100 mg via INTRAVENOUS

## 2014-09-30 MED ORDER — BUPROPION HCL ER (XL) 300 MG PO TB24
300.0000 mg | ORAL_TABLET | Freq: Every morning | ORAL | Status: DC
  Filled 2014-09-30: qty 1

## 2014-09-30 MED ORDER — TAMSULOSIN HCL 0.4 MG PO CAPS
0.4000 mg | ORAL_CAPSULE | Freq: Every day | ORAL | Status: DC
  Filled 2014-09-30: qty 1

## 2014-09-30 MED ORDER — FENTANYL CITRATE 0.05 MG/ML IJ SOLN
INTRAMUSCULAR | Status: AC
  Filled 2014-09-30: qty 4

## 2014-09-30 MED ORDER — METFORMIN HCL ER 500 MG PO TB24
500.0000 mg | ORAL_TABLET | Freq: Every day | ORAL | Status: DC
  Filled 2014-09-30: qty 1

## 2014-09-30 MED ORDER — KCL IN DEXTROSE-NACL 20-5-0.9 MEQ/L-%-% IV SOLN
INTRAVENOUS | Status: DC
  Filled 2014-09-30: qty 1000

## 2014-09-30 MED ORDER — CIPROFLOXACIN IN D5W 400 MG/200ML IV SOLN
INTRAVENOUS | Status: AC
  Filled 2014-09-30: qty 200

## 2014-09-30 MED ORDER — MIDAZOLAM HCL 2 MG/2ML IJ SOLN
INTRAMUSCULAR | Status: AC
  Filled 2014-09-30: qty 2

## 2014-09-30 MED ORDER — FENTANYL CITRATE 0.05 MG/ML IJ SOLN
25.0000 ug | INTRAMUSCULAR | Status: DC | PRN
  Filled 2014-09-30: qty 1

## 2014-09-30 MED ORDER — LISINOPRIL 2.5 MG PO TABS
2.5000 mg | ORAL_TABLET | Freq: Every day | ORAL | Status: DC
  Filled 2014-09-30: qty 1

## 2014-09-30 MED ORDER — BELLADONNA ALKALOIDS-OPIUM 16.2-60 MG RE SUPP
RECTAL | Status: DC | PRN
  Administered 2014-09-30: 1 via RECTAL

## 2014-09-30 MED ORDER — FENTANYL CITRATE 0.05 MG/ML IJ SOLN
INTRAMUSCULAR | Status: DC | PRN
  Administered 2014-09-30 (×2): 25 ug via INTRAVENOUS
  Administered 2014-09-30: 50 ug via INTRAVENOUS

## 2014-09-30 MED ORDER — ONDANSETRON HCL 4 MG/2ML IJ SOLN
4.0000 mg | INTRAMUSCULAR | Status: DC | PRN
  Filled 2014-09-30: qty 2

## 2014-09-30 MED ORDER — ACETAMINOPHEN 10 MG/ML IV SOLN
INTRAVENOUS | Status: DC | PRN
  Administered 2014-09-30: 1000 mg via INTRAVENOUS

## 2014-09-30 MED ORDER — MIDAZOLAM HCL 5 MG/5ML IJ SOLN
INTRAMUSCULAR | Status: DC | PRN
  Administered 2014-09-30: 2 mg via INTRAVENOUS

## 2014-09-30 MED ORDER — CIPROFLOXACIN IN D5W 400 MG/200ML IV SOLN
400.0000 mg | INTRAVENOUS | Status: AC
  Administered 2014-09-30: 400 mg via INTRAVENOUS
  Filled 2014-09-30: qty 200

## 2014-09-30 MED ORDER — CIPROFLOXACIN IN D5W 400 MG/200ML IV SOLN
400.0000 mg | Freq: Two times a day (BID) | INTRAVENOUS | Status: AC
  Administered 2014-09-30: 400 mg via INTRAVENOUS
  Filled 2014-09-30: qty 200

## 2014-09-30 MED ORDER — PROPOFOL 10 MG/ML IV BOLUS
INTRAVENOUS | Status: DC | PRN
  Administered 2014-09-30: 200 mg via INTRAVENOUS

## 2014-09-30 MED ORDER — PROMETHAZINE HCL 25 MG/ML IJ SOLN
6.2500 mg | INTRAMUSCULAR | Status: DC | PRN
Start: 2014-09-30 — End: 2014-09-30
  Filled 2014-09-30: qty 1

## 2014-09-30 MED ORDER — LACTATED RINGERS IV SOLN
INTRAVENOUS | Status: DC
  Administered 2014-09-30: 11:00:00 via INTRAVENOUS
  Filled 2014-09-30: qty 1000

## 2014-09-30 MED ORDER — OXYCODONE-ACETAMINOPHEN 5-325 MG PO TABS
1.0000 | ORAL_TABLET | ORAL | Status: DC | PRN
  Filled 2014-09-30: qty 2

## 2014-09-30 MED ORDER — LIDOCAINE HCL 2 % EX GEL
CUTANEOUS | Status: DC | PRN
  Administered 2014-09-30: 1

## 2014-09-30 MED ORDER — DOCUSATE SODIUM 100 MG PO CAPS
100.0000 mg | ORAL_CAPSULE | Freq: Two times a day (BID) | ORAL | Status: DC
  Administered 2014-09-30 (×2): 100 mg via ORAL
  Filled 2014-09-30: qty 1

## 2014-09-30 MED ORDER — DEXAMETHASONE SODIUM PHOSPHATE 4 MG/ML IJ SOLN
INTRAMUSCULAR | Status: DC | PRN
  Administered 2014-09-30: 10 mg via INTRAVENOUS

## 2014-09-30 MED ORDER — EPHEDRINE SULFATE 50 MG/ML IJ SOLN
INTRAMUSCULAR | Status: DC | PRN
  Administered 2014-09-30: 10 mg via INTRAVENOUS

## 2014-09-30 MED ORDER — ONDANSETRON HCL 4 MG/2ML IJ SOLN
INTRAMUSCULAR | Status: DC | PRN
  Administered 2014-09-30: 4 mg via INTRAVENOUS

## 2014-09-30 SURGICAL SUPPLY — 22 items
BAG DRAIN URO-CYSTO SKYTR STRL (DRAIN) ×3 IMPLANT
BAG DRN UROCATH (DRAIN) ×1
BAG URINE DRAINAGE (UROLOGICAL SUPPLIES) ×2 IMPLANT
CANISTER SUCT LVC 12 LTR MEDI- (MISCELLANEOUS) ×6 IMPLANT
CATH FOLEY 3WAY 30CC 22F (CATHETERS) ×2 IMPLANT
CLOTH BEACON ORANGE TIMEOUT ST (SAFETY) ×3 IMPLANT
DRAPE CAMERA CLOSED 9X96 (DRAPES) ×3 IMPLANT
ELECT LOOP MED HF 24F 12D CBL (CLIP) ×2 IMPLANT
GLOVE BIO SURGEON STRL SZ7.5 (GLOVE) ×3 IMPLANT
GLOVE BIOGEL PI IND STRL 7.5 (GLOVE) IMPLANT
GLOVE BIOGEL PI INDICATOR 7.5 (GLOVE) ×2
GLOVE SURG SS PI 7.5 STRL IVOR (GLOVE) ×2 IMPLANT
GOWN STRL REUS W/TWL LRG LVL3 (GOWN DISPOSABLE) ×2 IMPLANT
GOWN STRL REUS W/TWL XL LVL3 (GOWN DISPOSABLE) ×2 IMPLANT
HOLDER FOLEY CATH W/STRAP (MISCELLANEOUS) ×2 IMPLANT
IV NS IRRIG 3000ML ARTHROMATIC (IV SOLUTION) ×10 IMPLANT
NS IRRIG 500ML POUR BTL (IV SOLUTION) ×2 IMPLANT
PACK CYSTO (CUSTOM PROCEDURE TRAY) ×3 IMPLANT
SET ASPIRATION TUBING (TUBING) ×2 IMPLANT
SYR 30ML LL (SYRINGE) ×2 IMPLANT
SYRINGE IRR TOOMEY STRL 70CC (SYRINGE) ×2 IMPLANT
WATER STERILE IRR 500ML POUR (IV SOLUTION) ×2 IMPLANT

## 2014-09-30 NOTE — Op Note (Signed)
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  Anesthesia: Gen.  Indications: Patient is had long-standing voiding symptoms that have progressed and have been very bothersome to him despite out for blocker therapy. He was interested in definitive management. We discussed the pros and cons of TURP at length and he wanted to proceed with definitive procedure. He presents now for saline gyrus TURP.     Technique and findings: Patient was brought the operating room where he had successful induction of general anesthesia. He was placed in lithotomy position and prepped and draped in usual manner. He received perioperative antibiotics and placement of PAS compression boots. Appropriate surgical timeout was performed. We inserted a 28 French continuous flow resectoscope sheath with the visual obturator. Endoscopy revealed a 4 to half centimeter prostatic urethra. There was some lateral lobe tissue but the primary of obstruction appeared to be secondary to a very high riding and prominent median bar with a small to medium size middle lobe.   Resection was performed with saline as irrigant. A resection loop was utilized. The middle lobe and median bar were taken down to the capsular fibers of the bladder neck. Both ureteral orifices were easily identified and preserved. With resection of the middle lobe and the median bar the whole bladder neck was extremely wide open. We did not feel that resection of the lateral lobes would offer any benefit and would just increase the risk of bleeding and potential erectile dysfunction. It appeared to approximate 15 g of tissue was resected. Hemostasis was excellent. A three-way Foley catheter was placed with continuous bladder irrigation with saline. Prostate chips were sent for pathologic analysis. No obvious complications occurred. Blood loss was minimal. Patient was brought to  PACU in stable condition.

## 2014-09-30 NOTE — Interval H&P Note (Signed)
History and Physical Interval Note:  09/30/2014 10:43 AM  Jimmy Bradshaw  has presented today for surgery, with the diagnosis of BENIGN PROSTATIC HYPERPLASIA, BLADDER NECK OBSTRUCTION  The various methods of treatment have been discussed with the patient and family. After consideration of risks, benefits and other options for treatment, the patient has consented to  Procedure(s): TRANSURETHRAL RESECTION OF THE PROSTATE WITH GYRUS INSTRUMENTS (N/A) as a surgical intervention .  The patient's history has been reviewed, patient examined, no change in status, stable for surgery.  I have reviewed the patient's chart and labs.  Questions were answered to the patient's satisfaction.     Miraya Cudney S

## 2014-09-30 NOTE — Transfer of Care (Signed)
Immediate Anesthesia Transfer of Care Note  Patient: Jimmy Bradshaw  Procedure(s) Performed: Procedure(s) (LRB): TRANSURETHRAL RESECTION OF THE PROSTATE WITH GYRUS INSTRUMENTS (N/A)  Patient Location: PACU  Anesthesia Type: General  Level of Consciousness: awake, alert  and oriented  Airway & Oxygen Therapy: Patient Spontanous Breathing and Patient connected to face mask oxygen  Post-op Assessment: Report given to PACU RN and Post -op Vital signs reviewed and stable  Post vital signs: Reviewed and stable  Complications: No apparent anesthesia complications

## 2014-09-30 NOTE — Anesthesia Procedure Notes (Signed)
Procedure Name: LMA Insertion Date/Time: 09/30/2014 11:39 AM Performed by: Mechele Claude Pre-anesthesia Checklist: Patient identified, Emergency Drugs available, Suction available and Patient being monitored Patient Re-evaluated:Patient Re-evaluated prior to inductionOxygen Delivery Method: Circle System Utilized Preoxygenation: Pre-oxygenation with 100% oxygen Intubation Type: IV induction Ventilation: Mask ventilation without difficulty LMA: LMA inserted LMA Size: 5.0 Number of attempts: 1 Airway Equipment and Method: bite block Placement Confirmation: positive ETCO2 Tube secured with: Tape Dental Injury: Teeth and Oropharynx as per pre-operative assessment

## 2014-09-30 NOTE — H&P (Signed)
Jimmy Bradshaw is an 69 y.o. male.   Chief Complaint: For elective Gyrus TURP HPI: History of Present Illness   Jimmy Bradshaw) presented recently for routine 53-month followup. At one point his AUA symptom score was around 23. He previously was managed with tamsulosin and more recently has been on Rapaflo. He is continued to consider the possibility of TURP and is actually interested in scheduling something for early next year. He continues to have moderate obstructive and irritative voiding symptoms and is interested in a more definitive resolution to his problem. Has one residual today is less than 1 ounce.  PSA within the last year was normal at 1.9. Urinalysis showed nothing of concern.                 Past Medical History  Diagnosis Date  . Hyperlipidemia   . Anxiety   . H/O: gout   . History of colon polyps   . BPH (benign prostatic hypertrophy)   . Bladder neck obstruction   . Type 2 diabetes mellitus   . Frequency of urination   . Urgency of urination   . Nocturia   . Arthritis     HANDS,  SHOULDERS  . Wears hearing aid     BILATERAL    Past Surgical History  Procedure Laterality Date  . Partial knee arthroplasty Left 02/05/2013    Procedure: LEFT KNEE MEDIAL UNICOMPARTMENTAL KNEE;  Surgeon: Mauri Pole, MD;  Location: [facility];  Service: Orthopedics;  Laterality: Left;  . Colonoscopy  08-30-2011  . Knee arthroscopy Left 11/ 2013    Family History  Problem Relation Age of Onset  . Adopted: Yes   Social History:  reports that he quit smoking about 15 years ago. His smoking use included Cigarettes. He smoked 0.00 packs per day for 4 years. He has never used smokeless tobacco. He reports that he drinks alcohol. He reports that he does not use illicit drugs.  Allergies:  Allergies  Allergen Reactions  . Penicillins Rash    No prescriptions prior to admission    No results found for this or any previous visit (from the past 48 hour(s)). No results  found.  Review of Systems - Negative except long-standing obstructive and irritative voiding symptoms.  Height 6' (1.829 m), weight 107.049 kg (236 lb). General appearance: alert, cooperative and no distress Neck: no adenopathy and no JVD Resp: clear to auscultation bilaterally Cardio: regular rate and rhythm GI: soft, non-tender; bowel sounds normal; no masses,  no organomegaly Male genitalia: normal, penis: no lesions or discharge. testes: no masses or tenderness. no hernias Rectal: 1+ prostate Extremities: extremities normal, atraumatic, no cyanosis or edema Skin: Skin color, texture, turgor normal. No rashes or lesions Neurologic: Grossly normal  Assessment/Plan Long-standing obstructive and irritative voiding symptoms. Patient requested more definitive management. Was benefits of this procedure were discussed in detail with the patient. He is elected to proceed with TURP. He will be kept overnight from sedation status post the procedure.  Alam Guterrez S 09/30/2014, 7:27 AM

## 2014-09-30 NOTE — Op Note (Signed)
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  Anesthesia: Gen.  Indications: Patient is had long-standing voiding symptoms that have progressed and have been very bothersome to him despite out for blocker therapy. He was interested in definitive management. We discussed the pros and cons of TURP at length and he wanted to proceed with definitive procedure. He presents now for saline gyrus TURP.  Technique and findings: Patient was brought the operating room where he had successful induction of general anesthesia. He was placed in lithotomy position and prepped and draped in usual manner. He received perioperative antibiotics and placement of PAS compression boots. Appropriate surgical timeout was performed. We inserted a 28 French continuous flow resectoscope sheath with the visual obturator. Endoscopy revealed a 4 to half centimeter prostatic urethra. There was some lateral lobe tissue but the primary of obstruction appeared to be secondary to a very high riding and prominent median bar with a small to medium size middle lobe.  Resection was performed with saline as irrigant. A resection loop was utilized. The middle lobe and median bar were taken down to the capsular fibers of the bladder neck. Both ureteral orifices were easily identified and preserved. With resection of the middle lobe and the median bar the whole bladder neck was extremely wide open. We did not feel that resection of the lateral lobes would offer any benefit and would just increase the risk of bleeding and potential erectile dysfunction. It appeared to approximate 15 g of tissue was resected. Hemostasis was excellent. A three-way Foley catheter was placed with continuous bladder irrigation with saline. Prostate chips were sent for pathologic analysis. No obvious complications occurred. Blood loss was minimal. Patient was brought to PACU in  stable condition.

## 2014-09-30 NOTE — Anesthesia Preprocedure Evaluation (Signed)
Anesthesia Evaluation  Patient identified by MRN, date of birth, ID band Patient awake    Reviewed: Allergy & Precautions, NPO status , Patient's Chart, lab work & pertinent test results  Airway Mallampati: II  TM Distance: >3 FB Neck ROM: Full    Dental no notable dental hx.    Pulmonary former smoker,  breath sounds clear to auscultation  Pulmonary exam normal       Cardiovascular Exercise Tolerance: Good negative cardio ROS  Rhythm:Regular Rate:Normal     Neuro/Psych negative neurological ROS  negative psych ROS   GI/Hepatic negative GI ROS, Neg liver ROS,   Endo/Other  diabetes, Type 2, Oral Hypoglycemic Agents  Renal/GU negative Renal ROS  negative genitourinary   Musculoskeletal  (+) Arthritis -,   Abdominal (+) + obese,   Peds negative pediatric ROS (+)  Hematology negative hematology ROS (+)   Anesthesia Other Findings   Reproductive/Obstetrics negative OB ROS                             Anesthesia Physical Anesthesia Plan  ASA: II  Anesthesia Plan: General   Post-op Pain Management:    Induction: Intravenous  Airway Management Planned: LMA  Additional Equipment:   Intra-op Plan:   Post-operative Plan: Extubation in OR  Informed Consent: I have reviewed the patients History and Physical, chart, labs and discussed the procedure including the risks, benefits and alternatives for the proposed anesthesia with the patient or authorized representative who has indicated his/her understanding and acceptance.   Dental advisory given  Plan Discussed with: CRNA  Anesthesia Plan Comments:         Anesthesia Quick Evaluation

## 2014-09-30 NOTE — Anesthesia Postprocedure Evaluation (Signed)
  Anesthesia Post-op Note  Patient: Jimmy Bradshaw  Procedure(s) Performed: Procedure(s) (LRB): TRANSURETHRAL RESECTION OF THE PROSTATE WITH GYRUS INSTRUMENTS (N/A)  Patient Location: PACU  Anesthesia Type: general  Level of Consciousness: awake and alert   Airway and Oxygen Therapy: Patient Spontanous Breathing  Post-op Pain: mild  Post-op Assessment: Post-op Vital signs reviewed, Patient's Cardiovascular Status Stable, Respiratory Function Stable, Patent Airway and No signs of Nausea or vomiting  Last Vitals:  Filed Vitals:   09/30/14 1600  BP: 135/79  Pulse: 84  Temp: 36.8 C  Resp: 16    Post-op Vital Signs: stable   Complications: No apparent anesthesia complications

## 2014-10-01 ENCOUNTER — Encounter (HOSPITAL_BASED_OUTPATIENT_CLINIC_OR_DEPARTMENT_OTHER): Payer: Self-pay | Admitting: Urology

## 2014-10-01 DIAGNOSIS — N401 Enlarged prostate with lower urinary tract symptoms: Secondary | ICD-10-CM | POA: Diagnosis not present

## 2014-10-01 DIAGNOSIS — E119 Type 2 diabetes mellitus without complications: Secondary | ICD-10-CM | POA: Diagnosis not present

## 2014-10-01 DIAGNOSIS — N138 Other obstructive and reflux uropathy: Secondary | ICD-10-CM | POA: Diagnosis not present

## 2014-10-01 DIAGNOSIS — M19019 Primary osteoarthritis, unspecified shoulder: Secondary | ICD-10-CM | POA: Diagnosis not present

## 2014-10-01 DIAGNOSIS — Z7982 Long term (current) use of aspirin: Secondary | ICD-10-CM | POA: Diagnosis not present

## 2014-10-01 DIAGNOSIS — E669 Obesity, unspecified: Secondary | ICD-10-CM | POA: Diagnosis not present

## 2014-10-01 DIAGNOSIS — M109 Gout, unspecified: Secondary | ICD-10-CM | POA: Diagnosis not present

## 2014-10-01 DIAGNOSIS — Z87891 Personal history of nicotine dependence: Secondary | ICD-10-CM | POA: Diagnosis not present

## 2014-10-01 DIAGNOSIS — E785 Hyperlipidemia, unspecified: Secondary | ICD-10-CM | POA: Diagnosis not present

## 2014-10-01 LAB — BASIC METABOLIC PANEL
Anion gap: 9 (ref 5–15)
BUN: 20 mg/dL (ref 6–23)
CO2: 25 mmol/L (ref 19–32)
CREATININE: 0.85 mg/dL (ref 0.50–1.35)
Calcium: 9 mg/dL (ref 8.4–10.5)
Chloride: 107 mEq/L (ref 96–112)
GFR calc Af Amer: >90 mL/min (ref >90)
GFR calc non Af Amer: 88 mL/min — ABNORMAL LOW (ref >90)
Glucose, Bld: 203 mg/dL — ABNORMAL HIGH (ref 70–99)
Potassium: 4.4 mmol/L (ref 3.5–5.1)
Sodium: 141 mmol/L (ref 135–145)

## 2014-10-01 LAB — HEMOGLOBIN AND HEMATOCRIT, BLOOD
HEMATOCRIT: 39.2 % (ref 39.0–52.0)
Hemoglobin: 13.2 g/dL (ref 13.0–17.0)

## 2014-10-01 LAB — GLUCOSE, CAPILLARY: GLUCOSE-CAPILLARY: 187 mg/dL — AB (ref 70–99)

## 2014-10-01 MED ORDER — OXYCODONE-ACETAMINOPHEN 5-325 MG PO TABS: 1.0000 | ORAL_TABLET | ORAL | Status: DC | PRN

## 2014-10-01 MED ORDER — CIPROFLOXACIN HCL 500 MG PO TABS: 500.0000 mg | ORAL_TABLET | Freq: Two times a day (BID) | ORAL | Status: DC

## 2014-10-01 NOTE — Discharge Summary (Signed)
Patient ID: Jimmy Bradshaw MRN: [id number] DOB/AGE: [birth year] 69 y.o.  Admit date: 09/30/2014 Discharge date: 10/01/2014    Discharge Diagnoses:   Present on Admission:  . BPH with urinary obstruction  Consults:  None    Discharge Medications:   Medication List    STOP taking these medications        aspirin 81 MG tablet      TAKE these medications        buPROPion 300 MG 24 hr tablet  Commonly known as:  WELLBUTRIN XL  TAKE 1 TABLET EVERY MORNING     ciprofloxacin 500 MG tablet  Commonly known as:  CIPRO  Take 1 tablet (500 mg total) by mouth 2 (two) times daily.     indomethacin 50 MG capsule  Commonly known as:  INDOCIN  TAKE 1 CAPSULE THREE TIMES DAILY WITH MEALS FOR GOUT     lisinopril 2.5 MG tablet  Commonly known as:  PRINIVIL,ZESTRIL  TAKE 1 TABLET EVERY DAY     metFORMIN 500 MG 24 hr tablet  Commonly known as:  GLUCOPHAGE XR  Take 1 tablet (500 mg total) by mouth daily with breakfast.     oxyCODONE-acetaminophen 5-325 MG per tablet  Commonly known as:  PERCOCET/ROXICET  Take 1-2 tablets by mouth every 4 (four) hours as needed for moderate pain.     pravastatin 40 MG tablet  Commonly known as:  PRAVACHOL  TAKE 2 TABLETS  DAILY.     silodosin 8 MG Caps capsule  Commonly known as:  RAPAFLO  Take 8 mg by mouth daily with breakfast.         Significant Diagnostic Studies:  No results found.    Hospital Course:  Active Problems:   BPH with urinary obstruction  Patient underwent uneventful gyrus TURP. He was kept overnight for observation. Urine remained clear to very light pink. No obvious clinical problems. Postoperative labs unremarkable. Patient was discharged with an indwelling catheter for removal later this week. Day of Discharge BP 129/75 mmHg  Pulse 96  Temp(Src) 97.7 F (36.5 C) (Oral)  Resp 18  Ht 6' (1.829 m)  Wt 108.41 kg (239 lb)  BMI 32.41 kg/m2  SpO2 99%   Well-developed well-nourished male in no acute distress. Her  story effort normal. Abdomen soft and nontender. Genitourinary exam unremarkable other than indwelling catheter. Extremities without edema or tenderness.  Results for orders placed or performed during the hospital encounter of 09/30/14 (from the past 24 hour(s))  I-STAT, chem 8     Status: Abnormal   Collection Time: 09/30/14 11:22 AM  Result Value Ref Range   Sodium 141 135 - 145 mmol/L   Potassium 4.2 3.5 - 5.1 mmol/L   Chloride 105 96 - 112 mEq/L   BUN 19 6 - 23 mg/dL   Creatinine, Ser 1.00 0.50 - 1.35 mg/dL   Glucose, Bld 123 (H) 70 - 99 mg/dL   Calcium, Ion 1.23 1.13 - 1.30 mmol/L   TCO2 24 0 - 100 mmol/L   Hemoglobin 14.6 13.0 - 17.0 g/dL   HCT 43.0 39.0 - 52.0 %  Glucose, capillary     Status: Abnormal   Collection Time: 09/30/14 12:46 PM  Result Value Ref Range   Glucose-Capillary 124 (H) 70 - 99 mg/dL   Comment 1 Documented in Chart   Glucose, capillary     Status: Abnormal   Collection Time: 09/30/14  5:31 PM  Result Value Ref Range   Glucose-Capillary 171 (H) 70 - 99  mg/dL  Basic metabolic panel     Status: Abnormal   Collection Time: 10/01/14  4:40 AM  Result Value Ref Range   Sodium 141 135 - 145 mmol/L   Potassium 4.4 3.5 - 5.1 mmol/L   Chloride 107 96 - 112 mEq/L   CO2 25 19 - 32 mmol/L   Glucose, Bld 203 (H) 70 - 99 mg/dL   BUN 20 6 - 23 mg/dL   Creatinine, Ser 0.85 0.50 - 1.35 mg/dL   Calcium 9.0 8.4 - 10.5 mg/dL   GFR calc non Af Amer 88 (L) >90 mL/min   GFR calc Af Amer >90 >90 mL/min   Anion gap 9 5 - 15  Hemoglobin and hematocrit, blood     Status: None   Collection Time: 10/01/14  4:40 AM  Result Value Ref Range   Hemoglobin 13.2 13.0 - 17.0 g/dL   HCT 39.2 39.0 - 52.0 %  Glucose, capillary     Status: Abnormal   Collection Time: 10/01/14  5:44 AM  Result Value Ref Range   Glucose-Capillary 187 (H) 70 - 99 mg/dL

## 2014-10-01 NOTE — Discharge Instructions (Addendum)
Post transurethral resection of the prostate (TURP) instructions ° °Your recent prostate surgery requires very special post hospital care. Despite the fact that no skin incisions were used the area around the prostate incision is quite raw and is covered with a scab to promote healing and prevent bleeding. Certain cautions are needed to assure that the scab is not disturbed of the next 2-3 weeks while the healing proceeds. ° °Because the raw surface in your prostate and the irritating effects of urine you may expect frequency of urination and/or urgency (a stronger desire to urinate) and perhaps even getting up at night more often. This will usually resolve or improve slowly over the healing period. You may see some blood in your urine over the first 6 weeks. Do not be alarmed, even if the urine was clear for a while. Get off your feet and drink lots of fluids until clearing occurs. If you start to pass clots or don't improve call us. ° °Catheter: (If you are discharged with a catheter.) °1. Keep your catheter secured to your leg at all times with tape or the supplied strap. °2. You may experience leakage of urine around your catheter- as long as the  °catheter continues to drain, this is normal.  If your catheter stops draining  °go to the ER. °3. You may also have blood in your urine, even after it has been clear for  °several days; you may even pass some small blood clots or other material.  This  °is normal as well.  If this happens, sit down and drink plenty of water to help  °make urine to flush out your bladder.  If the blood in your urine becomes worse  °after doing this, contact our office or return to the ER. °4. You may use the leg bag (small bag) during the day, but use the large bag at  °night. ° °Diet: ° °You may return to your normal diet immediately. Because of the raw surface of your bladder, alcohol, spicy foods, foods high in acid and drinks with caffeine may cause irritation or frequency and  should be used in moderation. To keep your urine flowing freely and avoid constipation, drink plenty of fluids during the day (8-10 glasses). Tip: Avoid cranberry juice because it is very acidic. ° °Activity: ° °Your physical activity doesn't need to be restricted. However, if you are very active, you may see some blood in the urine. We suggest that you reduce your activity under the circumstances until the bleeding has stopped. ° °Bowels: ° °It is important to keep your bowels regular during the postoperative period. Straining with bowel movements can cause bleeding. A bowel movement every other day is reasonable. Use a mild laxative if needed, such as milk of magnesia 2-3 tablespoons, or 2 Dulcolax tablets. Call if you continue to have problems. If you had been taking narcotics for pain, before, during or after your surgery, you may be constipated. Take a laxative if necessary. ° °Medication: ° °You should resume your pre-surgery medications unless told not to. DO NOT RESUME YOUR ASPIRIN, WARFARIN, OR OTHER BLOOD THINNER FOR 1 WEEK. In addition you may be given an antibiotic to prevent or treat infection. Antibiotics are not always necessary. All medication should be taken as prescribed until the bottles are finished unless you are having an unusual reaction to one of the drugs. ° ° ° ° °Problems you should report to us: ° °a. Fever greater than 101°F. °b. Heavy bleeding, or clots (see   notes above about blood in urine). c. Inability to urinate. d. Drug reactions (hives, rash, nausea, vomiting, diarrhea). e. Severe burning or pain with urination that is not improving.  May restart aspirin 1 week Post Anesthesia Home Care Instructions  Activity: Get plenty of rest for the remainder of the day. A responsible adult should stay with you for 24 hours following the procedure.  For the next 24 hours, DO NOT: -Drive a car -Paediatric nurse -Drink alcoholic beverages -Take any medication unless instructed by  your physician -Make any legal decisions or sign important papers.  Meals: Start with liquid foods such as gelatin or soup. Progress to regular foods as tolerated. Avoid greasy, spicy, heavy foods. If nausea and/or vomiting occur, drink only clear liquids until the nausea and/or vomiting subsides. Call your physician if vomiting continues.  Special Instructions/Symptoms: Your throat may feel dry or sore from the anesthesia or the breathing tube placed in your throat during surgery. If this causes discomfort, gargle with warm salt water. The discomfort should disappear within 24 hours.

## 2014-10-04 ENCOUNTER — Telehealth: Payer: Self-pay | Admitting: *Deleted

## 2014-10-04 DIAGNOSIS — M1712 Unilateral primary osteoarthritis, left knee: Secondary | ICD-10-CM | POA: Diagnosis not present

## 2014-10-04 DIAGNOSIS — M25462 Effusion, left knee: Secondary | ICD-10-CM | POA: Diagnosis not present

## 2014-10-04 DIAGNOSIS — M25562 Pain in left knee: Secondary | ICD-10-CM | POA: Diagnosis not present

## 2014-10-04 DIAGNOSIS — M12862 Other specific arthropathies, not elsewhere classified, left knee: Secondary | ICD-10-CM | POA: Diagnosis not present

## 2014-10-04 DIAGNOSIS — M10062 Idiopathic gout, left knee: Secondary | ICD-10-CM | POA: Diagnosis not present

## 2014-10-04 NOTE — Telephone Encounter (Signed)
Submitted humana referral thru acuity connect for authorization for Dr. Verda Cumins at [facility] orthopedic with authorization number [id number]  Dx: M25.562- Pain in left knee  Number of visits: 6  Start Date:10/04/14- End Date:04/05/15  Spoke with Caren Griffins at [facility] ortho and aware of authorization number

## 2014-10-07 ENCOUNTER — Encounter (HOSPITAL_COMMUNITY): Payer: Self-pay | Admitting: *Deleted

## 2014-10-07 ENCOUNTER — Telehealth: Payer: Self-pay | Admitting: Family Medicine

## 2014-10-07 DIAGNOSIS — Z96652 Presence of left artificial knee joint: Secondary | ICD-10-CM | POA: Diagnosis not present

## 2014-10-07 DIAGNOSIS — M1712 Unilateral primary osteoarthritis, left knee: Secondary | ICD-10-CM | POA: Diagnosis not present

## 2014-10-07 DIAGNOSIS — M25562 Pain in left knee: Secondary | ICD-10-CM | POA: Diagnosis not present

## 2014-10-07 DIAGNOSIS — Z471 Aftercare following joint replacement surgery: Secondary | ICD-10-CM | POA: Diagnosis not present

## 2014-10-07 NOTE — Progress Notes (Signed)
Please put orders in Epic for Same day surgery tomorrow 10-08-14 Thanks

## 2014-10-07 NOTE — Telephone Encounter (Signed)
MD to be made aware.  

## 2014-10-07 NOTE — Telephone Encounter (Signed)
Patient is being admitted to [facility] tomorrow for a couple of days due to infection in his Left knee. He said he will be in here shortly to see you for follow up on his diabetes.

## 2014-10-08 ENCOUNTER — Encounter (HOSPITAL_COMMUNITY): Payer: Self-pay | Admitting: *Deleted

## 2014-10-08 ENCOUNTER — Inpatient Hospital Stay (HOSPITAL_COMMUNITY)
Admission: RE | Admit: 2014-10-08 | Discharge: 2014-10-10 | DRG: 464 | Disposition: A | Discharge status: Home Health | Payer: Medicare HMO | Source: Ambulatory Visit | Attending: Orthopedic Surgery | Admitting: Orthopedic Surgery

## 2014-10-08 ENCOUNTER — Other Ambulatory Visit: Payer: Self-pay

## 2014-10-08 ENCOUNTER — Telehealth: Payer: Self-pay | Admitting: *Deleted

## 2014-10-08 ENCOUNTER — Inpatient Hospital Stay (HOSPITAL_COMMUNITY): Payer: Medicare HMO | Admitting: Anesthesiology

## 2014-10-08 ENCOUNTER — Encounter (HOSPITAL_COMMUNITY)
Admission: RE | Disposition: A | Discharge status: Home Health | Payer: Self-pay | Source: Ambulatory Visit | Attending: Orthopedic Surgery

## 2014-10-08 DIAGNOSIS — I471 Supraventricular tachycardia: Secondary | ICD-10-CM | POA: Diagnosis not present

## 2014-10-08 DIAGNOSIS — Z792 Long term (current) use of antibiotics: Secondary | ICD-10-CM | POA: Diagnosis not present

## 2014-10-08 DIAGNOSIS — E785 Hyperlipidemia, unspecified: Secondary | ICD-10-CM | POA: Diagnosis not present

## 2014-10-08 DIAGNOSIS — M109 Gout, unspecified: Secondary | ICD-10-CM | POA: Diagnosis present

## 2014-10-08 DIAGNOSIS — Z880 Allergy status to penicillin: Secondary | ICD-10-CM

## 2014-10-08 DIAGNOSIS — E119 Type 2 diabetes mellitus without complications: Secondary | ICD-10-CM | POA: Diagnosis present

## 2014-10-08 DIAGNOSIS — Z452 Encounter for adjustment and management of vascular access device: Secondary | ICD-10-CM | POA: Diagnosis not present

## 2014-10-08 DIAGNOSIS — Z96659 Presence of unspecified artificial knee joint: Secondary | ICD-10-CM | POA: Diagnosis present

## 2014-10-08 DIAGNOSIS — Z87891 Personal history of nicotine dependence: Secondary | ICD-10-CM | POA: Diagnosis not present

## 2014-10-08 DIAGNOSIS — M25562 Pain in left knee: Secondary | ICD-10-CM | POA: Diagnosis not present

## 2014-10-08 DIAGNOSIS — F419 Anxiety disorder, unspecified: Secondary | ICD-10-CM | POA: Diagnosis not present

## 2014-10-08 DIAGNOSIS — Z5181 Encounter for therapeutic drug level monitoring: Secondary | ICD-10-CM | POA: Diagnosis not present

## 2014-10-08 DIAGNOSIS — N400 Enlarged prostate without lower urinary tract symptoms: Secondary | ICD-10-CM | POA: Diagnosis not present

## 2014-10-08 DIAGNOSIS — M25569 Pain in unspecified knee: Secondary | ICD-10-CM | POA: Diagnosis present

## 2014-10-08 DIAGNOSIS — T8484XA Pain due to internal orthopedic prosthetic devices, implants and grafts, initial encounter: Secondary | ICD-10-CM | POA: Diagnosis not present

## 2014-10-08 DIAGNOSIS — M1990 Unspecified osteoarthritis, unspecified site: Secondary | ICD-10-CM | POA: Diagnosis present

## 2014-10-08 DIAGNOSIS — Y838 Other surgical procedures as the cause of abnormal reaction of the patient, or of later complication, without mention of misadventure at the time of the procedure: Secondary | ICD-10-CM | POA: Diagnosis present

## 2014-10-08 DIAGNOSIS — Z79899 Other long term (current) drug therapy: Secondary | ICD-10-CM

## 2014-10-08 DIAGNOSIS — T8454XA Infection and inflammatory reaction due to internal left knee prosthesis, initial encounter: Secondary | ICD-10-CM | POA: Diagnosis not present

## 2014-10-08 DIAGNOSIS — Z79891 Long term (current) use of opiate analgesic: Secondary | ICD-10-CM | POA: Diagnosis not present

## 2014-10-08 DIAGNOSIS — Z86010 Personal history of colonic polyps: Secondary | ICD-10-CM

## 2014-10-08 DIAGNOSIS — T8454XD Infection and inflammatory reaction due to internal left knee prosthesis, subsequent encounter: Secondary | ICD-10-CM | POA: Diagnosis not present

## 2014-10-08 HISTORY — PX: I&D KNEE WITH POLY EXCHANGE: SHX5024

## 2014-10-08 LAB — GLUCOSE, CAPILLARY
Glucose-Capillary: 95 mg/dL (ref 70–99)
Glucose-Capillary: 158 mg/dL — ABNORMAL HIGH (ref 70–99)
Glucose-Capillary: 194 mg/dL — ABNORMAL HIGH (ref 70–99)

## 2014-10-08 LAB — BASIC METABOLIC PANEL
Anion gap: 9 (ref 5–15)
BUN: 23 mg/dL (ref 6–23)
CALCIUM: 9 mg/dL (ref 8.4–10.5)
CHLORIDE: 106 meq/L (ref 96–112)
CO2: 25 mmol/L (ref 19–32)
Creatinine, Ser: 0.89 mg/dL (ref 0.50–1.35)
GFR calc Af Amer: >90 mL/min (ref >90)
GFR, EST NON AFRICAN AMERICAN: 87 mL/min — AB (ref >90)
Glucose, Bld: 118 mg/dL — ABNORMAL HIGH (ref 70–99)
Potassium: 4.5 mmol/L (ref 3.5–5.1)
Sodium: 140 mmol/L (ref 135–145)

## 2014-10-08 LAB — URINE MICROSCOPIC-ADD ON

## 2014-10-08 LAB — URINALYSIS, ROUTINE W REFLEX MICROSCOPIC
GLUCOSE, UA: NEGATIVE mg/dL
KETONES UR: 40 mg/dL — AB
Nitrite: NEGATIVE
PH: 5 (ref 5.0–8.0)
PROTEIN: 30 mg/dL — AB
Specific Gravity, Urine: 1.028 (ref 1.005–1.030)
Urobilinogen, UA: 0.2 mg/dL (ref 0.0–1.0)

## 2014-10-08 LAB — APTT: aPTT: 31 seconds (ref 24–37)

## 2014-10-08 LAB — CBC
HEMATOCRIT: 42.1 % (ref 39.0–52.0)
Hemoglobin: 14.6 g/dL (ref 13.0–17.0)
MCH: 31.5 pg (ref 26.0–34.0)
MCHC: 34.7 g/dL (ref 30.0–36.0)
MCV: 90.7 fL (ref 78.0–100.0)
Platelets: 242 10*3/uL (ref 150–400)
RBC: 4.64 MIL/uL (ref 4.22–5.81)
RDW: 13.4 % (ref 11.5–15.5)
WBC: 16.2 10*3/uL — ABNORMAL HIGH (ref 4.0–10.5)

## 2014-10-08 LAB — PROTIME-INR
INR: 1.16 (ref 0.00–1.49)
Prothrombin Time: 15 seconds (ref 11.6–15.2)

## 2014-10-08 LAB — TYPE AND SCREEN
ABO/RH(D): O POS
ANTIBODY SCREEN: NEGATIVE

## 2014-10-08 LAB — GRAM STAIN

## 2014-10-08 SURGERY — IRRIGATION AND DEBRIDEMENT KNEE WITH POLY EXCHANGE
Anesthesia: General | Site: Knee | Laterality: Left

## 2014-10-08 MED ORDER — BUPIVACAINE-EPINEPHRINE (PF) 0.25% -1:200000 IJ SOLN
INTRAMUSCULAR | Status: AC
  Filled 2014-10-08: qty 30

## 2014-10-08 MED ORDER — CEFAZOLIN SODIUM-DEXTROSE 2-3 GM-% IV SOLR
2.0000 g | INTRAVENOUS | Status: AC
  Administered 2014-10-08: 2 g via INTRAVENOUS

## 2014-10-08 MED ORDER — BUPROPION HCL ER (XL) 300 MG PO TB24
300.0000 mg | ORAL_TABLET | Freq: Every morning | ORAL | Status: DC
  Administered 2014-10-09 – 2014-10-10 (×2): 300 mg via ORAL
  Filled 2014-10-08 (×2): qty 1

## 2014-10-08 MED ORDER — ONDANSETRON HCL 4 MG/2ML IJ SOLN: 4.0000 mg | Freq: Four times a day (QID) | INTRAMUSCULAR | Status: DC | PRN

## 2014-10-08 MED ORDER — MIDAZOLAM HCL 2 MG/2ML IJ SOLN
INTRAMUSCULAR | Status: AC
  Filled 2014-10-08: qty 2

## 2014-10-08 MED ORDER — ROCURONIUM BROMIDE 100 MG/10ML IV SOLN
INTRAVENOUS | Status: DC | PRN
  Administered 2014-10-08: 30 mg via INTRAVENOUS

## 2014-10-08 MED ORDER — HYDROMORPHONE HCL 1 MG/ML IJ SOLN
INTRAMUSCULAR | Status: AC
  Filled 2014-10-08: qty 1

## 2014-10-08 MED ORDER — INSULIN ASPART 100 UNIT/ML ~~LOC~~ SOLN
0.0000 [IU] | Freq: Three times a day (TID) | SUBCUTANEOUS | Status: DC
  Administered 2014-10-09: 3 [IU] via SUBCUTANEOUS
  Administered 2014-10-09 – 2014-10-10 (×4): 2 [IU] via SUBCUTANEOUS

## 2014-10-08 MED ORDER — DOCUSATE SODIUM 100 MG PO CAPS
100.0000 mg | ORAL_CAPSULE | Freq: Two times a day (BID) | ORAL | Status: DC
  Administered 2014-10-08 – 2014-10-10 (×4): 100 mg via ORAL
  Filled 2014-10-08 (×5): qty 1

## 2014-10-08 MED ORDER — ALUM & MAG HYDROXIDE-SIMETH 200-200-20 MG/5ML PO SUSP
30.0000 mL | ORAL | Status: DC | PRN
Start: 2014-10-08 — End: 2014-10-10

## 2014-10-08 MED ORDER — PROPOFOL 10 MG/ML IV BOLUS
INTRAVENOUS | Status: AC
  Filled 2014-10-08: qty 20

## 2014-10-08 MED ORDER — CEFAZOLIN SODIUM-DEXTROSE 2-3 GM-% IV SOLR
2.0000 g | Freq: Four times a day (QID) | INTRAVENOUS | Status: AC
  Administered 2014-10-08 – 2014-10-09 (×2): 2 g via INTRAVENOUS
  Filled 2014-10-08 (×2): qty 50

## 2014-10-08 MED ORDER — OXYCODONE HCL 5 MG PO TABS
5.0000 mg | ORAL_TABLET | ORAL | Status: DC
  Administered 2014-10-09 (×3): 10 mg via ORAL
  Administered 2014-10-09 (×3): 15 mg via ORAL
  Administered 2014-10-10: 5 mg via ORAL
  Administered 2014-10-10 (×2): 10 mg via ORAL
  Filled 2014-10-08: qty 3
  Filled 2014-10-08 (×3): qty 2
  Filled 2014-10-08 (×2): qty 3
  Filled 2014-10-08 (×3): qty 2

## 2014-10-08 MED ORDER — DEXAMETHASONE SODIUM PHOSPHATE 10 MG/ML IJ SOLN
10.0000 mg | Freq: Once | INTRAMUSCULAR | Status: AC
  Administered 2014-10-09: 10 mg via INTRAVENOUS
  Filled 2014-10-08: qty 1

## 2014-10-08 MED ORDER — METOCLOPRAMIDE HCL 10 MG PO TABS: 5.0000 mg | ORAL_TABLET | Freq: Three times a day (TID) | ORAL | Status: DC | PRN

## 2014-10-08 MED ORDER — SODIUM CHLORIDE 0.9 % IV SOLN
INTRAVENOUS | Status: DC
  Administered 2014-10-08: 23:00:00 via INTRAVENOUS
  Filled 2014-10-08 (×6): qty 1000

## 2014-10-08 MED ORDER — VANCOMYCIN HCL 1000 MG IV SOLR
1000.0000 mg | INTRAVENOUS | Status: DC | PRN
  Administered 2014-10-08: 1000 mg via INTRAVENOUS

## 2014-10-08 MED ORDER — BISACODYL 10 MG RE SUPP: 10.0000 mg | Freq: Every day | RECTAL | Status: DC | PRN

## 2014-10-08 MED ORDER — FENTANYL CITRATE 0.05 MG/ML IJ SOLN
25.0000 ug | INTRAMUSCULAR | Status: DC | PRN
  Administered 2014-10-08 (×2): 50 ug via INTRAVENOUS

## 2014-10-08 MED ORDER — HYDROMORPHONE HCL 1 MG/ML IJ SOLN
0.2500 mg | INTRAMUSCULAR | Status: DC | PRN
  Administered 2014-10-08 (×4): 0.5 mg via INTRAVENOUS

## 2014-10-08 MED ORDER — METHOCARBAMOL 500 MG PO TABS
500.0000 mg | ORAL_TABLET | Freq: Four times a day (QID) | ORAL | Status: DC | PRN
  Administered 2014-10-08 – 2014-10-09 (×2): 500 mg via ORAL
  Filled 2014-10-08 (×2): qty 1

## 2014-10-08 MED ORDER — MENTHOL 3 MG MT LOZG
1.0000 | LOZENGE | OROMUCOSAL | Status: DC | PRN
  Filled 2014-10-08: qty 9

## 2014-10-08 MED ORDER — DIPHENHYDRAMINE HCL 25 MG PO CAPS
25.0000 mg | ORAL_CAPSULE | Freq: Four times a day (QID) | ORAL | Status: DC | PRN
  Administered 2014-10-09 (×3): 25 mg via ORAL
  Filled 2014-10-08 (×3): qty 1

## 2014-10-08 MED ORDER — LACTATED RINGERS IV SOLN
INTRAVENOUS | Status: DC
  Administered 2014-10-08: 1000 mL via INTRAVENOUS

## 2014-10-08 MED ORDER — SUCCINYLCHOLINE CHLORIDE 20 MG/ML IJ SOLN
INTRAMUSCULAR | Status: DC | PRN
  Administered 2014-10-08: 100 mg via INTRAVENOUS

## 2014-10-08 MED ORDER — CHLORHEXIDINE GLUCONATE 4 % EX LIQD: 60.0000 mL | Freq: Once | CUTANEOUS | Status: DC

## 2014-10-08 MED ORDER — PHENOL 1.4 % MT LIQD
1.0000 | OROMUCOSAL | Status: DC | PRN
  Filled 2014-10-08: qty 177

## 2014-10-08 MED ORDER — PROPOFOL 10 MG/ML IV BOLUS
INTRAVENOUS | Status: DC | PRN
  Administered 2014-10-08: 200 mg via INTRAVENOUS

## 2014-10-08 MED ORDER — FENTANYL CITRATE 0.05 MG/ML IJ SOLN
INTRAMUSCULAR | Status: DC | PRN
  Administered 2014-10-08 (×3): 50 ug via INTRAVENOUS
  Administered 2014-10-08: 100 ug via INTRAVENOUS

## 2014-10-08 MED ORDER — SODIUM CHLORIDE 0.9 % IR SOLN
Status: DC | PRN
  Administered 2014-10-08 (×2): 3000 mL

## 2014-10-08 MED ORDER — LACTATED RINGERS IV SOLN: INTRAVENOUS | Status: DC

## 2014-10-08 MED ORDER — HYDROMORPHONE HCL 2 MG/ML IJ SOLN
INTRAMUSCULAR | Status: AC
  Filled 2014-10-08: qty 1

## 2014-10-08 MED ORDER — HYDROMORPHONE HCL 1 MG/ML IJ SOLN
0.5000 mg | INTRAMUSCULAR | Status: DC | PRN
Start: 2014-10-08 — End: 2014-10-10
  Administered 2014-10-08 – 2014-10-09 (×3): 1 mg via INTRAVENOUS
  Administered 2014-10-09: 2 mg via INTRAVENOUS
  Filled 2014-10-08: qty 1
  Filled 2014-10-08: qty 2
  Filled 2014-10-08: qty 1
  Filled 2014-10-08: qty 2

## 2014-10-08 MED ORDER — LIDOCAINE HCL (CARDIAC) 20 MG/ML IV SOLN
INTRAVENOUS | Status: AC
  Filled 2014-10-08: qty 5

## 2014-10-08 MED ORDER — PRAVASTATIN SODIUM 80 MG PO TABS
80.0000 mg | ORAL_TABLET | Freq: Every day | ORAL | Status: DC
  Administered 2014-10-08 – 2014-10-09 (×2): 80 mg via ORAL
  Filled 2014-10-08 (×3): qty 1

## 2014-10-08 MED ORDER — METFORMIN HCL ER 500 MG PO TB24
500.0000 mg | ORAL_TABLET | Freq: Every day | ORAL | Status: DC
  Administered 2014-10-09 – 2014-10-10 (×2): 500 mg via ORAL
  Filled 2014-10-08 (×3): qty 1

## 2014-10-08 MED ORDER — BUPIVACAINE-EPINEPHRINE (PF) 0.25% -1:200000 IJ SOLN
INTRAMUSCULAR | Status: DC | PRN
  Administered 2014-10-08: 30 mL

## 2014-10-08 MED ORDER — DEXTROSE 5 % IV SOLN
500.0000 mg | Freq: Four times a day (QID) | INTRAVENOUS | Status: DC | PRN
  Administered 2014-10-08 – 2014-10-09 (×2): 500 mg via INTRAVENOUS
  Filled 2014-10-08 (×4): qty 5

## 2014-10-08 MED ORDER — CELECOXIB 200 MG PO CAPS
200.0000 mg | ORAL_CAPSULE | Freq: Two times a day (BID) | ORAL | Status: DC
  Administered 2014-10-08 – 2014-10-10 (×4): 200 mg via ORAL
  Filled 2014-10-08 (×5): qty 1

## 2014-10-08 MED ORDER — MIDAZOLAM HCL 5 MG/5ML IJ SOLN
INTRAMUSCULAR | Status: DC | PRN
  Administered 2014-10-08: 2 mg via INTRAVENOUS

## 2014-10-08 MED ORDER — MAGNESIUM CITRATE PO SOLN: 1.0000 | Freq: Once | ORAL | Status: AC | PRN

## 2014-10-08 MED ORDER — VANCOMYCIN HCL 1000 MG IV SOLR
1500.0000 mg | Freq: Once | INTRAVENOUS | Status: DC
  Filled 2014-10-08: qty 2000

## 2014-10-08 MED ORDER — ESMOLOL HCL 10 MG/ML IV SOLN
INTRAVENOUS | Status: AC
  Filled 2014-10-08: qty 10

## 2014-10-08 MED ORDER — GLYCOPYRROLATE 0.2 MG/ML IJ SOLN
INTRAMUSCULAR | Status: DC | PRN
  Administered 2014-10-08: .8 mg via INTRAVENOUS

## 2014-10-08 MED ORDER — FERROUS SULFATE 325 (65 FE) MG PO TABS
325.0000 mg | ORAL_TABLET | Freq: Three times a day (TID) | ORAL | Status: DC
  Administered 2014-10-08 – 2014-10-10 (×6): 325 mg via ORAL
  Filled 2014-10-08 (×8): qty 1

## 2014-10-08 MED ORDER — HYDROMORPHONE HCL 1 MG/ML IJ SOLN
INTRAMUSCULAR | Status: DC | PRN
  Administered 2014-10-08: 0.5 mg via INTRAVENOUS

## 2014-10-08 MED ORDER — METOCLOPRAMIDE HCL 5 MG/ML IJ SOLN: 5.0000 mg | Freq: Three times a day (TID) | INTRAMUSCULAR | Status: DC | PRN

## 2014-10-08 MED ORDER — TAMSULOSIN HCL 0.4 MG PO CAPS
0.4000 mg | ORAL_CAPSULE | Freq: Every day | ORAL | Status: DC
  Administered 2014-10-08 – 2014-10-10 (×3): 0.4 mg via ORAL
  Filled 2014-10-08 (×3): qty 1

## 2014-10-08 MED ORDER — ONDANSETRON HCL 4 MG/2ML IJ SOLN
INTRAMUSCULAR | Status: DC | PRN
  Administered 2014-10-08: 4 mg via INTRAVENOUS

## 2014-10-08 MED ORDER — CEFAZOLIN SODIUM-DEXTROSE 2-3 GM-% IV SOLR
INTRAVENOUS | Status: AC
  Filled 2014-10-08: qty 50

## 2014-10-08 MED ORDER — POLYETHYLENE GLYCOL 3350 17 G PO PACK
17.0000 g | PACK | Freq: Two times a day (BID) | ORAL | Status: DC
  Administered 2014-10-08 – 2014-10-10 (×4): 17 g via ORAL
  Filled 2014-10-08 (×5): qty 1

## 2014-10-08 MED ORDER — ONDANSETRON HCL 4 MG PO TABS: 4.0000 mg | ORAL_TABLET | Freq: Four times a day (QID) | ORAL | Status: DC | PRN

## 2014-10-08 MED ORDER — FENTANYL CITRATE 0.05 MG/ML IJ SOLN
INTRAMUSCULAR | Status: AC
  Filled 2014-10-08: qty 5

## 2014-10-08 MED ORDER — GLYCOPYRROLATE 0.2 MG/ML IJ SOLN
INTRAMUSCULAR | Status: AC
  Filled 2014-10-08: qty 4

## 2014-10-08 MED ORDER — LIDOCAINE HCL (CARDIAC) 20 MG/ML IV SOLN
INTRAVENOUS | Status: DC | PRN
  Administered 2014-10-08: 100 mg via INTRAVENOUS

## 2014-10-08 MED ORDER — ONDANSETRON HCL 4 MG/2ML IJ SOLN
INTRAMUSCULAR | Status: AC
  Filled 2014-10-08: qty 2

## 2014-10-08 MED ORDER — FENTANYL CITRATE 0.05 MG/ML IJ SOLN
INTRAMUSCULAR | Status: AC
  Filled 2014-10-08: qty 2

## 2014-10-08 MED ORDER — ASPIRIN EC 325 MG PO TBEC
325.0000 mg | DELAYED_RELEASE_TABLET | Freq: Two times a day (BID) | ORAL | Status: DC
  Administered 2014-10-09 – 2014-10-10 (×3): 325 mg via ORAL
  Filled 2014-10-08 (×5): qty 1

## 2014-10-08 MED ORDER — ESMOLOL HCL 10 MG/ML IV SOLN
INTRAVENOUS | Status: DC | PRN
  Administered 2014-10-08: 30 mg
  Administered 2014-10-08 (×2): 30 mg via INTRAVENOUS
  Administered 2014-10-08: 30 mg

## 2014-10-08 MED ORDER — VANCOMYCIN HCL IN DEXTROSE 1-5 GM/200ML-% IV SOLN
INTRAVENOUS | Status: AC
  Filled 2014-10-08: qty 200

## 2014-10-08 MED ORDER — NEOSTIGMINE METHYLSULFATE 10 MG/10ML IV SOLN
INTRAVENOUS | Status: DC | PRN
  Administered 2014-10-08: 5 mg via INTRAVENOUS

## 2014-10-08 SURGICAL SUPPLY — 70 items
BAG SPEC THK2 15X12 ZIP CLS (MISCELLANEOUS) ×1
BAG ZIPLOCK 12X15 (MISCELLANEOUS) ×3 IMPLANT
BANDAGE ELASTIC 4 VELCRO ST LF (GAUZE/BANDAGES/DRESSINGS) ×1 IMPLANT
BANDAGE ELASTIC 6 VELCRO ST LF (GAUZE/BANDAGES/DRESSINGS) ×3 IMPLANT
BANDAGE ESMARK 6X9 LF (GAUZE/BANDAGES/DRESSINGS) ×1 IMPLANT
BEARING TIBIAL OXFORD MED 4 (Orthopedic Implant) ×1 IMPLANT
BEARING TIBIAL OXFORD MED 4MM (Orthopedic Implant) ×1 IMPLANT
BNDG CMPR 9X6 STRL LF SNTH (GAUZE/BANDAGES/DRESSINGS) ×1
BNDG ESMARK 6X9 LF (GAUZE/BANDAGES/DRESSINGS) ×3
BRNG TIB MED 4 PHS 3 LT MEN (Orthopedic Implant) ×1 IMPLANT
CLOSURE WOUND 1/2 X4 (GAUZE/BANDAGES/DRESSINGS) ×2
CUFF TOURN SGL QUICK 34 (TOURNIQUET CUFF) ×3
CUFF TRNQT CYL 34X4X40X1 (TOURNIQUET CUFF) ×1 IMPLANT
DECANTER SPIKE VIAL GLASS SM (MISCELLANEOUS) ×3 IMPLANT
DRAPE EXTREMITY T 121X128X90 (DRAPE) ×3 IMPLANT
DRAPE POUCH INSTRU U-SHP 10X18 (DRAPES) ×3 IMPLANT
DRAPE U-SHAPE 47X51 STRL (DRAPES) ×3 IMPLANT
DRSG ADAPTIC 3X8 NADH LF (GAUZE/BANDAGES/DRESSINGS) ×1 IMPLANT
DRSG AQUACEL AG ADV 3.5X10 (GAUZE/BANDAGES/DRESSINGS) ×2 IMPLANT
DRSG PAD ABDOMINAL 8X10 ST (GAUZE/BANDAGES/DRESSINGS) ×2 IMPLANT
DRSG TEGADERM 4X4.75 (GAUZE/BANDAGES/DRESSINGS) ×2 IMPLANT
DURAPREP 26ML APPLICATOR (WOUND CARE) ×3 IMPLANT
ELECT REM PT RETURN 9FT ADLT (ELECTROSURGICAL) ×3
ELECTRODE REM PT RTRN 9FT ADLT (ELECTROSURGICAL) ×1 IMPLANT
EVACUATOR 1/8 PVC DRAIN (DRAIN) ×3 IMPLANT
FACESHIELD WRAPAROUND (MASK) ×15 IMPLANT
FACESHIELD WRAPAROUND OR TEAM (MASK) ×5 IMPLANT
FLOSEAL 10ML (HEMOSTASIS) ×1 IMPLANT
GAUZE SPONGE 2X2 8PLY STRL LF (GAUZE/BANDAGES/DRESSINGS) IMPLANT
GAUZE SPONGE 4X4 12PLY STRL (GAUZE/BANDAGES/DRESSINGS) ×2 IMPLANT
GLOVE BIOGEL PI IND STRL 7.5 (GLOVE) ×1 IMPLANT
GLOVE BIOGEL PI IND STRL 8.5 (GLOVE) ×1 IMPLANT
GLOVE BIOGEL PI INDICATOR 7.5 (GLOVE) ×2
GLOVE BIOGEL PI INDICATOR 8.5 (GLOVE) ×2
GLOVE ECLIPSE 8.0 STRL XLNG CF (GLOVE) IMPLANT
GLOVE ORTHO TXT STRL SZ7.5 (GLOVE) ×6 IMPLANT
GLOVE SURG ORTHO 8.0 STRL STRW (GLOVE) ×3 IMPLANT
GOWN SPEC L3 XXLG W/TWL (GOWN DISPOSABLE) ×6 IMPLANT
GOWN STRL REUS W/TWL LRG LVL3 (GOWN DISPOSABLE) ×3 IMPLANT
HANDPIECE INTERPULSE COAX TIP (DISPOSABLE) ×3
IMMOBILIZER KNEE 20 (SOFTGOODS)
IMMOBILIZER KNEE 20 THIGH 36 (SOFTGOODS) IMPLANT
KIT BASIN OR (CUSTOM PROCEDURE TRAY) ×3 IMPLANT
LIQUID BAND (GAUZE/BANDAGES/DRESSINGS) ×2 IMPLANT
MANIFOLD NEPTUNE II (INSTRUMENTS) ×3 IMPLANT
NDL SAFETY ECLIPSE 18X1.5 (NEEDLE) ×1 IMPLANT
NEEDLE HYPO 18GX1.5 SHARP (NEEDLE) ×3
NS IRRIG 1000ML POUR BTL (IV SOLUTION) ×4 IMPLANT
PACK TOTAL JOINT (CUSTOM PROCEDURE TRAY) ×3 IMPLANT
PADDING CAST COTTON 6X4 STRL (CAST SUPPLIES) ×2 IMPLANT
POSITIONER SURGICAL ARM (MISCELLANEOUS) ×3 IMPLANT
SET HNDPC FAN SPRY TIP SCT (DISPOSABLE) ×1 IMPLANT
SPONGE GAUZE 2X2 STER 10/PKG (GAUZE/BANDAGES/DRESSINGS) ×2
SPONGE LAP 18X18 X RAY DECT (DISPOSABLE) ×3 IMPLANT
STRIP CLOSURE SKIN 1/2X4 (GAUZE/BANDAGES/DRESSINGS) ×4 IMPLANT
SUCTION FRAZIER 12FR DISP (SUCTIONS) ×3 IMPLANT
SUT MNCRL AB 3-0 PS2 18 (SUTURE) ×2 IMPLANT
SUT MNCRL AB 4-0 PS2 18 (SUTURE) ×3 IMPLANT
SUT PDS AB 1 CT1 27 (SUTURE) ×4 IMPLANT
SUT VIC AB 1 CT1 36 (SUTURE) ×9 IMPLANT
SUT VIC AB 2-0 CT1 27 (SUTURE) ×9
SUT VIC AB 2-0 CT1 TAPERPNT 27 (SUTURE) ×3 IMPLANT
SWAB COLLECTION DEVICE MRSA (MISCELLANEOUS) ×3 IMPLANT
SYR 50ML LL SCALE MARK (SYRINGE) ×3 IMPLANT
SYR CONTROL 10ML LL (SYRINGE) ×3 IMPLANT
TOWEL OR 17X26 10 PK STRL BLUE (TOWEL DISPOSABLE) ×6 IMPLANT
TRAY FOLEY CATH 14FRSI W/METER (CATHETERS) ×1 IMPLANT
TUBE ANAEROBIC SPECIMEN COL (MISCELLANEOUS) ×3 IMPLANT
WATER STERILE IRR 1500ML POUR (IV SOLUTION) ×1 IMPLANT
WRAP KNEE MAXI GEL POST OP (GAUZE/BANDAGES/DRESSINGS) ×3 IMPLANT

## 2014-10-08 NOTE — H&P (Signed)
Jimmy Bradshaw is an 69 y.o. male.    Chief Complaint: Acute onset of left knee pain and swelling. He has a background history of left partial knee replacement in 2014 by Dr. Alvan Dame.  Procedure:  I&D left UKR with possible poly exchange  HPI:   The patient is a pleasant 69 year old male who presents for follow-up evaluation of left knee pain and swelling.  He was seen as an acute visit on Friday, the 15th of January. He is a patient of Dr. Aurea Graff. He had a unicompartment partial knee replacement on Feb 05, 2013. He had no real problems with it until presenting to the office and see Dr. Drema Dallas on January 15th. He reports about 24 hours of worsening knee pain and swelling. No accidents, trauma, or falls. It just stiffened up on him. He came in for an acute visit.  Complicating the situation he had prostate surgery on Monday, January 11th. It sounds like they just shaved down the prostate that was enlarged from BPH. He took five days of b.i.d. Cipro.  When seen on January 15th the left knee was aspirated. There was cloudy, yellowish, greenish material that was aspirated. It seemed consistent with gout. They went ahead and injected him with a combination of Marcaine, Lidocaine, and Kenalog. He says that he did well Friday, the 15th into Saturday the 16th but his pain came back yesterday. It was quite severe. He thinks it has reaccumulated to a certain degree. We did send the fluid off for culture sensitivey, gram stain, cell count, and crystals. Preliminary report on the culture shows no growth x2 days. Gram stain just showed some WBC's. There were no crystals seen. There were 70,400 WBC's with 90% neutrophils. He reports he is not as bad as he was Friday but yesterday was quite diffiult and he seems to be getting worse.  Various options were discussed and the patient wishes to proceed with an I&D of the left knee with possible poly exchange. Risks, benefits and expectations were  discussed with the patient.  Risks including but not limited to the risk of anesthesia, blood clots, nerve damage, blood vessel damage, failure of the prosthesis, infection and up to and including death.  Patient understand the risks, benefits and expectations and wishes to proceed with surgery.    PCP: Vic Blackbird, MD  D/C Plans:      Home with HHPT  Post-op Meds:       No Rx given   PMH: Past Medical History  Diagnosis Date  . Hyperlipidemia   . Anxiety   . H/O: gout   . History of colon polyps   . BPH (benign prostatic hypertrophy)   . Bladder neck obstruction   . Type 2 diabetes mellitus   . Frequency of urination   . Urgency of urination   . Nocturia   . Arthritis     HANDS,  SHOULDERS  . Wears hearing aid     BILATERAL    PSH: Past Surgical History  Procedure Laterality Date  . Partial knee arthroplasty Left 02/05/2013    Procedure: LEFT KNEE MEDIAL UNICOMPARTMENTAL KNEE;  Surgeon: Mauri Pole, MD;  Location: [facility] ORS;  Service: Orthopedics;  Laterality: Left;  . Colonoscopy  08-30-2011  . Knee arthroscopy Left 11/ 2013  . Transurethral resection of prostate N/A 09/30/2014    Procedure: TRANSURETHRAL RESECTION OF THE PROSTATE WITH GYRUS INSTRUMENTS;  Surgeon: Bernestine Amass, MD;  Location: [facility];  Service:  Urology;  Laterality: N/A;    Social History:  reports that he quit smoking about 15 years ago. His smoking use included Cigarettes. He quit after 4 years of use. He has never used smokeless tobacco. He reports that he drinks alcohol. He reports that he does not use illicit drugs.  Allergies:  Allergies  Allergen Reactions  . Penicillins Rash    Medications: No current facility-administered medications for this encounter.   Current Outpatient Prescriptions  Medication Sig Dispense Refill  . buPROPion (WELLBUTRIN XL) 300 MG 24 hr tablet TAKE 1 TABLET EVERY MORNING 90 tablet 0  . ciprofloxacin (CIPRO) 500 MG tablet Take 1 tablet (500  mg total) by mouth 2 (two) times daily. 10 tablet 0  . indomethacin (INDOCIN) 50 MG capsule TAKE 1 CAPSULE THREE TIMES DAILY WITH MEALS FOR GOUT (Patient taking differently: TAKE 1 CAPSULE THREE TIMES DAILY  PRN WITH MEALS FOR GOUT) 60 capsule 1  . lisinopril (PRINIVIL,ZESTRIL) 2.5 MG tablet TAKE 1 TABLET EVERY DAY (Patient taking differently: TAKE 1 TABLET EVERY DAY-   TAKES IN AM) 90 tablet 1  . metFORMIN (GLUCOPHAGE XR) 500 MG 24 hr tablet Take 1 tablet (500 mg total) by mouth daily with breakfast. 90 tablet 1  . oxyCODONE-acetaminophen (PERCOCET/ROXICET) 5-325 MG per tablet Take 1-2 tablets by mouth every 4 (four) hours as needed for moderate pain. 15 tablet 0  . pravastatin (PRAVACHOL) 40 MG tablet TAKE 2 TABLETS  DAILY. (Patient taking differently: TAKE 2 TABLETS  DAILY.  EVERY EVENING) 180 tablet 0  . silodosin (RAPAFLO) 8 MG CAPS capsule Take 8 mg by mouth daily with breakfast.        Review of Systems  Constitutional: Negative.   HENT: Positive for hearing loss.   Eyes: Negative.   Respiratory: Negative.   Cardiovascular: Negative.   Gastrointestinal: Negative.   Genitourinary: Positive for urgency and frequency.  Musculoskeletal: Positive for joint pain.  Skin: Negative.   Neurological: Negative.   Endo/Heme/Allergies: Negative.   Psychiatric/Behavioral: The patient is nervous/anxious.      Physical Exam  Constitutional: He is oriented to person, place, and time. He appears well-developed and well-nourished.  HENT:  Head: Normocephalic and atraumatic.  Eyes: Pupils are equal, round, and reactive to light.  Neck: Neck supple. No JVD present. No tracheal deviation present. No thyromegaly present.  Cardiovascular: Normal rate, regular rhythm, normal heart sounds and intact distal pulses.   Respiratory: Effort normal and breath sounds normal. No stridor. No respiratory distress. He has no wheezes.  GI: Soft. There is no tenderness. There is no guarding.  Musculoskeletal:        Left knee: He exhibits decreased range of motion, swelling, effusion, laceration (previous incision healed well) and bony tenderness. He exhibits no ecchymosis, no deformity, no erythema and normal alignment. Tenderness found.  Lymphadenopathy:    He has no cervical adenopathy.  Neurological: He is alert and oriented to person, place, and time.  Skin: Skin is warm and dry.  Psychiatric: He has a normal mood and affect.      X-RAYS: Three views of the left knee including bilateral AP and bilateral Merchant views of the right knee were ordered and interpreted here in the office 10/04/14. Hardware looks like it is in good position in the medial compartment of the left knee. There is hypertrophic spurring off of the patella, especially the superior aspect. There is narrowing, left greater than right, in the patellofemoral compartment. There is a joint effusion. There is no acute  fracture, dislocation or acute osseous findings.     LABS: Synovial fluid aspirated 10/04/14 showed 70,400 WBC's. 90% neutrophils. No crystals seen. Gram stain showed some WBC's. Culture shows no growth x2 days.     Assessment/Plan Assessment:   Acute onset of left knee pain and swelling. He has a background history of left partial knee replacement in 2014 by Dr. Alvan Dame.    Plan: Patient will undergo an I&D left UKR with possible poly exchange on 10/08/2014 per Dr. Alvan Dame at [facility]. Risks benefits and expectations were discussed with the patient. Patient understand risks, benefits and expectations and wishes to proceed.   West Pugh Alberto Schoch   PA-C  10/08/2014, 9:42 AM

## 2014-10-08 NOTE — Anesthesia Preprocedure Evaluation (Addendum)
Anesthesia Evaluation  Patient identified by MRN, date of birth, ID band Patient awake    Reviewed: Allergy & Precautions, H&P , NPO status , Patient's Chart, lab work & pertinent test results  Airway Mallampati: II  TM Distance: >3 FB Neck ROM: full    Dental  (+) Missing, Partial Lower, Partial Upper, Dental Advisory Given All front teeth are missing and he wears partials that he is not going to remove.  We informed him that they could be damaged during anesthesia.:   Pulmonary neg pulmonary ROS, former smoker,  breath sounds clear to auscultation  Pulmonary exam normal       Cardiovascular Exercise Tolerance: Good negative cardio ROS  Rhythm:regular Rate:Normal     Neuro/Psych Anxiety negative neurological ROS  negative psych ROS   GI/Hepatic negative GI ROS, Neg liver ROS,   Endo/Other  diabetes, Well Controlled, Type 2, Oral Hypoglycemic Agents  Renal/GU negative Renal ROS Bladder dysfunction   negative genitourinary   Musculoskeletal  (+) Arthritis -,   Abdominal   Peds  Hematology negative hematology ROS (+)   Anesthesia Other Findings   Reproductive/Obstetrics negative OB ROS                         Anesthesia Physical Anesthesia Plan  ASA: III  Anesthesia Plan: General   Post-op Pain Management:    Induction: Intravenous  Airway Management Planned: Oral ETT  Additional Equipment:   Intra-op Plan:   Post-operative Plan: Extubation in OR  Informed Consent: I have reviewed the patients History and Physical, chart, labs and discussed the procedure including the risks, benefits and alternatives for the proposed anesthesia with the patient or authorized representative who has indicated his/her understanding and acceptance.   Dental Advisory Given  Plan Discussed with: CRNA and Surgeon  Anesthesia Plan Comments:         Anesthesia Quick Evaluation                                   Anesthesia Evaluation  Patient identified by MRN, date of birth, ID band Patient awake    Reviewed: Allergy & Precautions, NPO status , Patient's Chart, lab work & pertinent test results  Airway Mallampati: II  TM Distance: >3 FB Neck ROM: Full    Dental no notable dental hx.    Pulmonary former smoker,  breath sounds clear to auscultation  Pulmonary exam normal       Cardiovascular Exercise Tolerance: Good negative cardio ROS  Rhythm:Regular Rate:Normal     Neuro/Psych negative neurological ROS  negative psych ROS   GI/Hepatic negative GI ROS, Neg liver ROS,   Endo/Other  diabetes, Type 2, Oral Hypoglycemic Agents  Renal/GU negative Renal ROS  negative genitourinary   Musculoskeletal  (+) Arthritis -,   Abdominal (+) + obese,   Peds negative pediatric ROS (+)  Hematology negative hematology ROS (+)   Anesthesia Other Findings   Reproductive/Obstetrics negative OB ROS                             Anesthesia Physical Anesthesia Plan  ASA: II  Anesthesia Plan: General   Post-op Pain Management:    Induction: Intravenous  Airway Management Planned: LMA  Additional Equipment:   Intra-op Plan:   Post-operative Plan: Extubation in OR  Informed Consent: I have reviewed the patients History and  Physical, chart, labs and discussed the procedure including the risks, benefits and alternatives for the proposed anesthesia with the patient or authorized representative who has indicated his/her understanding and acceptance.   Dental advisory given  Plan Discussed with: CRNA  Anesthesia Plan Comments:         Anesthesia Quick Evaluation

## 2014-10-08 NOTE — Anesthesia Postprocedure Evaluation (Signed)
  Anesthesia Post-op Note  Patient: Jimmy Bradshaw  Procedure(s) Performed: Procedure(s) (LRB): IRRIGATION AND DEBRIDEMENT LEFT UNI KNEE ARTHOPLASTY  WITH POLY EXCHANGE (Left)  Patient Location: PACU  Anesthesia Type: General  Level of Consciousness: awake and alert   Airway and Oxygen Therapy: Patient Spontanous Breathing  Post-op Pain: mild  Post-op Assessment: Post-op Vital signs reviewed, Patient's Cardiovascular Status Stable, Respiratory Function Stable, Patent Airway and No signs of Nausea or vomiting  Last Vitals: There were no vitals filed for this visit.  Post-op Vital Signs: stable   Complications: New onset atrial fibrillation. Will admit to telemetry and get cardiology consult.

## 2014-10-08 NOTE — Brief Op Note (Signed)
10/08/2014  5:45 PM  PATIENT:  Jimmy Bradshaw  69 y.o. 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  POST-OPERATIVE DIAGNOSIS:  Left knee infection versus inflammatory arthritis status post unicompartmental knee replacement  PROCEDURE:  Procedure(s): IRRIGATION AND DEBRIDEMENT LEFT UNI KNEE ARTHOPLASTY  WITH POLY EXCHANGE (Left)  SURGEON:  Surgeon(s) and Role:    * Mauri Pole, MD - Primary  PHYSICIAN ASSISTANT:None   ANESTHESIA:   general  EBL:  Total I/O In: 1000 [I.V.:1000] Out: -   BLOOD ADMINISTERED:none  DRAINS: (1 medium) Hemovact drain(s) in the left knee with  Suction Open   LOCAL MEDICATIONS USED:  MARCAINE     SPECIMEN:  Source of Specimen:  left knee fluid swabs  DISPOSITION OF SPECIMEN:  PATHOLOGY  COUNTS:  YES  TOURNIQUET:   Total Tourniquet Time Documented: Thigh (laterality) - 32 minutes Total: Thigh (laterality) - 32 minutes   DICTATION: .Other Dictation: Dictation Number [id number]  PLAN OF CARE: Admit to inpatient   PATIENT DISPOSITION:  PACU - hemodynamically stable.   Delay start of Pharmacological VTE agent (>24hrs) due to surgical blood loss or risk of bleeding: no

## 2014-10-08 NOTE — Anesthesia Procedure Notes (Signed)
Date/Time: 10/08/2014 4:30 PM Performed by: Carleene Cooper A Pre-anesthesia Checklist: Patient identified, Timeout performed, Emergency Drugs available, Suction available and Patient being monitored Patient Re-evaluated:Patient Re-evaluated prior to inductionOxygen Delivery Method: Circle system utilized Preoxygenation: Pre-oxygenation with 100% oxygen Intubation Type: IV induction Ventilation: Mask ventilation without difficulty Laryngoscope Size: Mac and 4 Grade View: Grade I Tube type: Oral Tube size: 7.5 mm Number of attempts: 1 Airway Equipment and Method: Stylet Placement Confirmation: breath sounds checked- equal and bilateral,  ETT inserted through vocal cords under direct vision and positive ETCO2 Secured at: 22 cm Tube secured with: Tape Dental Injury: Teeth and Oropharynx as per pre-operative assessment

## 2014-10-08 NOTE — Interval H&P Note (Signed)
History and Physical Interval Note:  10/08/2014 4:19 PM  Jimmy Bradshaw  has presented today for surgery, with the diagnosis of INFECTED UNI KNEE ARTHOPLASTY ON LEFT  The various methods of treatment have been discussed with the patient and family. After consideration of risks, benefits and other options for treatment, the patient has consented to  Procedure(s): IRRIGATION AND DEBRIDEMENT LEFT UNI KNEE ARTHOPLASTY  WITH POLY EXCHANGE (Left) as a surgical intervention .  The patient's history has been reviewed, patient examined, no change in status, stable for surgery.  I have reviewed the patient's chart and labs.  Questions were answered to the patient's satisfaction.     Mauri Pole

## 2014-10-08 NOTE — Transfer of Care (Signed)
Immediate Anesthesia Transfer of Care Note  Patient: Jimmy Bradshaw  Procedure(s) Performed: Procedure(s): IRRIGATION AND DEBRIDEMENT LEFT UNI KNEE ARTHOPLASTY  WITH POLY EXCHANGE (Left)  Patient Location: PACU  Anesthesia Type:General  Level of Consciousness: awake, alert , oriented and patient cooperative  Airway & Oxygen Therapy: Patient Spontanous Breathing and Patient connected to nasal cannula oxygen  Post-op Assessment: Report given to PACU RN, Post -op Vital signs reviewed and stable and Patient moving all extremities  Post vital signs: Reviewed and stable  Complications: No apparent anesthesia complications

## 2014-10-08 NOTE — Telephone Encounter (Signed)
Submitted humana referral thru acuity connect for authorization for Dr. Paralee Cancel at [facility], with authorization number [id number]  Dx: T85.54XA-Infect/inflm reaction due to internal left knee prosthe,init  Number of visits:6  Start Date 10/08/2014-04/06/2015 end

## 2014-10-08 NOTE — Progress Notes (Signed)
During and after extubation the patient developed  SVT with rate 140 -170.  Esmolol 60 mg resolved this and he was in sinus with a rate of 85 to 100.  When arrived in PACU, however, the heart rate was irregularly irregular at a rate of 110 - 115 with no clear P waves.  We did 12 lead ECG which confirmed A fib with RVR.  There is no known history of A fib.  We will admit him to telemetry and get cardiology to see him.   Der Gagliano MD

## 2014-10-09 ENCOUNTER — Encounter (HOSPITAL_COMMUNITY): Payer: Self-pay | Admitting: Orthopedic Surgery

## 2014-10-09 LAB — BASIC METABOLIC PANEL
Anion gap: 8 (ref 5–15)
BUN: 24 mg/dL — ABNORMAL HIGH (ref 6–23)
CHLORIDE: 103 meq/L (ref 96–112)
CO2: 25 mmol/L (ref 19–32)
CREATININE: 0.82 mg/dL (ref 0.50–1.35)
Calcium: 8.2 mg/dL — ABNORMAL LOW (ref 8.4–10.5)
GFR calc non Af Amer: 89 mL/min — ABNORMAL LOW (ref >90)
Glucose, Bld: 162 mg/dL — ABNORMAL HIGH (ref 70–99)
POTASSIUM: 4.2 mmol/L (ref 3.5–5.1)
Sodium: 136 mmol/L (ref 135–145)

## 2014-10-09 LAB — GLUCOSE, CAPILLARY
GLUCOSE-CAPILLARY: 146 mg/dL — AB (ref 70–99)
GLUCOSE-CAPILLARY: 205 mg/dL — AB (ref 70–99)
Glucose-Capillary: 195 mg/dL — ABNORMAL HIGH (ref 70–99)
Glucose-Capillary: 142 mg/dL — ABNORMAL HIGH (ref 70–99)

## 2014-10-09 LAB — CBC
HEMATOCRIT: 38.6 % — AB (ref 39.0–52.0)
HEMOGLOBIN: 12.9 g/dL — AB (ref 13.0–17.0)
MCH: 30.8 pg (ref 26.0–34.0)
MCHC: 33.4 g/dL (ref 30.0–36.0)
MCV: 92.1 fL (ref 78.0–100.0)
Platelets: 241 10*3/uL (ref 150–400)
RBC: 4.19 MIL/uL — ABNORMAL LOW (ref 4.22–5.81)
RDW: 13.6 % (ref 11.5–15.5)
WBC: 15.1 10*3/uL — ABNORMAL HIGH (ref 4.0–10.5)

## 2014-10-09 MED ORDER — SODIUM CHLORIDE 0.9 % IJ SOLN
10.0000 mL | INTRAMUSCULAR | Status: DC | PRN
  Administered 2014-10-10: 10 mL
  Filled 2014-10-09: qty 40

## 2014-10-09 NOTE — Evaluation (Signed)
Physical Therapy Evaluation Patient Details Name: Jimmy Bradshaw MRN: [id number] DOB: 1946/04/01 Today's Date: 10/09/2014   History of Present Illness  69 yo male s/p I&D and poly exchange on L Knee 10/08/14. hx of L UKR 01/2013, DM  Clinical Impression  On eval, pt required Min guard assist for mobility-able to ambulate ~60 feet with use of walker. Distance limited by pain. Pt somewhat guarded about situation and participation with PT but he was eventually agreeable to mobilizing. Pt not currently tolerating knee ROM well-will continue to work with pt on performing ROM exercises as able.     Follow Up Recommendations Home health PT;Supervision - Intermittent    Equipment Recommendations  None recommended by PT    Recommendations for Other Services       Precautions / Restrictions Precautions Precautions: Fall;Knee Restrictions Weight Bearing Restrictions: No LLE Weight Bearing: Weight bearing as tolerated Other Position/Activity Restrictions:  (WBAT)      Mobility  Bed Mobility Overal bed mobility: Needs Assistance Bed Mobility: Supine to Sit;Sit to Supine     Supine to sit: Supervision Sit to supine: Supervision   General bed mobility comments: Pt prefers to mobilize L LE on his own despite task being painful, effortful. Suggested pt using hook technique to assist as needed.   Transfers Overall transfer level: Needs assistance Equipment used: Rolling walker (2 wheeled) Transfers: Sit to/from Stand Sit to Stand: Min guard;From elevated surface         General transfer comment: Highly elevated surface. VCs safety, technique, hand placement.   Ambulation/Gait Ambulation/Gait assistance: Min guard Ambulation Distance (Feet): 60 Feet Assistive device: Rolling walker (2 wheeled) Gait Pattern/deviations: Step-to pattern;Decreased stance time - left;Decreased step length - left;Antalgic;Decreased weight shift to left     General Gait Details: Pt using PWB gait  pattern, with weight mostly through forefoot. Distance limited by pain.   Stairs            Wheelchair Mobility    Modified Rankin (Stroke Patients Only)       Balance                                             Pertinent Vitals/Pain Pain Assessment: 0-10 Pain Score: 8  Pain Location: L knee/thigh Pain Descriptors / Indicators: Sore;Aching Pain Intervention(s): Premedicated before session;Ice applied;Monitored during session;Limited activity within patient's tolerance    Home Living Family/patient expects to be discharged to:: Private residence Living Arrangements: Spouse/significant other Available Help at Discharge: Family;Available 24 hours/day Type of Home: House Home Access: Stairs to enter Entrance Stairs-Rails: Can reach both Entrance Stairs-Number of Steps:  (16) Home Layout: Two level;Able to live on main level with bedroom/bathroom Home Equipment: [place] - 2 wheels;Bedside commode;Shower seat;Grab bars - tub/shower;Hand held shower head Additional Comments: Pt. wife can not physically A him.    Prior Function Level of Independence: Independent               Hand Dominance   Dominant Hand: Right    Extremity/Trunk Assessment   Upper Extremity Assessment: Defer to OT evaluation           Lower Extremity Assessment: LLE deficits/detail   LLE Deficits / Details: hip flex 3-/5, poor quad set, moves ankle well.   Cervical / Trunk Assessment: Normal  Communication   Communication: HOH  Cognition Arousal/Alertness: Awake/alert Behavior During Therapy: WFL for tasks  assessed/performed Overall Cognitive Status: Within Functional Limits for tasks assessed                      General Comments      Exercises        Assessment/Plan    PT Assessment Patient needs continued PT services  PT Diagnosis Difficulty walking;Generalized weakness;Abnormality of gait;Acute pain   PT Problem List Decreased  strength;Decreased range of motion;Decreased activity tolerance;Decreased balance;Decreased mobility;Pain;Decreased knowledge of use of DME  PT Treatment Interventions DME instruction;Gait training;Stair training;Functional mobility training;Therapeutic activities;Therapeutic exercise;Patient/family education;Balance training   PT Goals (Current goals can be found in the Care Plan section) Acute Rehab PT Goals Patient Stated Goal: less pain. home PT Goal Formulation: With patient Time For Goal Achievement: 10/16/14 Potential to Achieve Goals: Good    Frequency Min 5X/week   Barriers to discharge        Co-evaluation               End of Session Equipment Utilized During Treatment: Gait belt Activity Tolerance: Patient limited by pain Patient left: in bed;with call bell/phone within reach           Time: 0921-0951 PT Time Calculation (min) (ACUTE ONLY): 30 min   Charges:   PT Evaluation $Initial PT Evaluation Tier I: 1 Procedure PT Treatments $Gait Training: 8-22 mins   PT G Codes:        Weston Anna, MPT Pager: [phone]

## 2014-10-09 NOTE — Progress Notes (Signed)
Peripherally Inserted Central Catheter/Midline Placement  The IV Nurse has discussed with the patient and/or persons authorized to consent for the patient, the purpose of this procedure and the potential benefits and risks involved with this procedure.  The benefits include less needle sticks, lab draws from the catheter and patient may be discharged home with the catheter.  Risks include, but not limited to, infection, bleeding, blood clot (thrombus formation), and puncture of an artery; nerve damage and irregular heat beat.  Alternatives to this procedure were also discussed.  PICC/Midline Placement Documentation  PICC / Midline Single Lumen 40/08/67 PICC Right Basilic 43 cm 3 cm (Active)  Indication for Insertion or Continuance of Line Home intravenous therapies (PICC only) 10/09/2014  4:00 PM  Exposed Catheter (cm) 3 cm 10/09/2014  4:00 PM  Dressing Change Due 10/16/14 10/09/2014  4:00 PM       Jule Economy Horton 10/09/2014, 4:45 PM

## 2014-10-09 NOTE — Progress Notes (Signed)
Pt unable to void since surgery.Bladder scan yield 725. Merla Riches, PA notified and new order for I&O cath PRN received. I&O cath preformed with a return of 800 cc of clear yellow urine. Will continue to monitor.

## 2014-10-09 NOTE — Evaluation (Signed)
Occupational Therapy Evaluation Patient Details Name: Jimmy Bradshaw MRN: [id number] DOB: May 06, 1946 Today's Date: 10/09/2014    History of Present Illness Pt. underwent I&D and poly exchange on L Knee. unsure of sx of infection vs. inflammatory arthroplasty. Pt. had hx of L knee replacement in 5-14.   Clinical Impression   Pt. Will need further OT to maximize I with ADLs and mobility. Pt. States he was having difficulty with donning socks for "a long time". Pt. Is currently Max A with LE dressing without AE. Pt. Is Min A with sit to stand and with transfer to chair. Pt. Wife can not physically A with pt. Care at d/c. Pt. States he needs to be at Mod I level at d/c. Pt. Plans to d/c home with wife.     Follow Up Recommendations  No OT follow up    Equipment Recommendations  None recommended by OT    Recommendations for Other Services       Precautions / Restrictions Precautions Precautions: Fall Restrictions Other Position/Activity Restrictions:  (WBAT)      Mobility Bed Mobility Overal bed mobility: Needs Assistance Bed Mobility: Supine to Sit     Supine to sit: Supervision        Transfers                 General transfer comment: Min A with sit to stand from bed and with stand pivot transfer bed to chair    Balance                                            ADL Overall ADL's : Needs assistance/impaired Eating/Feeding: Independent;Sitting   Grooming: Wash/dry hands;Wash/dry face;Set up;Sitting   Upper Body Bathing: Set up;Sitting   Lower Body Bathing: Moderate assistance;Sit to/from stand   Upper Body Dressing : Set up;Sitting   Lower Body Dressing: Maximal assistance;Sit to/from stand   Toilet Transfer: Minimal assistance;Stand-pivot           Functional mobility during ADLs: Minimal assistance General ADL Comments: Pt. would benefit from further AE training.      Vision                     Perception      Praxis      Pertinent Vitals/Pain Pain Assessment: 0-10 Pain Score: 8  Pain Location:  (L knee) Pain Descriptors / Indicators: Dull Pain Intervention(s): Premedicated before session     Hand Dominance Right   Extremity/Trunk Assessment Upper Extremity Assessment Upper Extremity Assessment: Overall WFL for tasks assessed           Communication Communication Communication: HOH   Cognition Arousal/Alertness: Awake/alert Behavior During Therapy: WFL for tasks assessed/performed Overall Cognitive Status: Within Functional Limits for tasks assessed                     General Comments       Exercises       Shoulder Instructions      Home Living Family/patient expects to be discharged to:: Private residence Living Arrangements: Spouse/significant other Available Help at Discharge: Family;Available 24 hours/day Type of Home: House Home Access: Stairs to enter [employer] of Steps:  (16) Entrance Stairs-Rails: Can reach both Home Layout: Two level;Able to live on main level with bedroom/bathroom     Bathroom Shower/Tub: Tub/shower unit Shower/tub characteristics: Curtain  Bathroom Toilet: Handicapped height Bathroom Accessibility: Yes How Accessible: Accessible via walker Home Equipment: [place] - 2 wheels;Bedside commode;Shower seat;Grab bars - tub/shower;Hand held shower head   Additional Comments: Pt. wife can not physically A him.      Prior Functioning/Environment Level of Independence: Independent             OT Diagnosis: Acute pain   OT Problem List: Decreased activity tolerance;Decreased knowledge of use of DME or AE   OT Treatment/Interventions: Self-care/ADL training;DME and/or AE instruction;Therapeutic activities    OT Goals(Current goals can be found in the care plan section) Acute Rehab OT Goals Patient Stated Goal:  (go home) OT Goal Formulation: With patient Time For Goal Achievement: 10/23/14 Potential to Achieve  Goals: Good ADL Goals Pt Will Perform Grooming: standing;with supervision Pt Will Perform Lower Body Bathing: with supervision;with set-up;with adaptive equipment;sit to/from stand Pt Will Perform Lower Body Dressing: with supervision;with adaptive equipment;sit to/from stand Pt Will Transfer to Toilet: with supervision;stand pivot transfer;bedside commode;ambulating Pt Will Perform Toileting - Clothing Manipulation and hygiene: with supervision;sit to/from stand;with adaptive equipment  OT Frequency: Min 2X/week   Barriers to D/C: Decreased caregiver support   (Pt. wife can not provide physical A.)       Co-evaluation              End of Session Equipment Utilized During Treatment: Rolling walker Nurse Communication: Weight bearing status  Activity Tolerance: Patient tolerated treatment well Patient left: in chair;with call bell/phone within reach   Time: 0730-0824 OT Time Calculation (min): 54 min Charges:  OT General Charges $OT Visit: 1 Procedure OT Evaluation $Initial OT Evaluation Tier I: 1 Procedure OT Treatments $Self Care/Home Management : 23-37 mins $Therapeutic Activity: 8-22 mins G-Codes:    [name] 10/28/14, 8:28 AM

## 2014-10-09 NOTE — Op Note (Signed)
Jimmy Bradshaw, Jimmy Bradshaw NO.:  [id number]  MEDICAL RECORD NO.:  [id number]  LOCATION:  1424                         FACILITY:  [facility]  PHYSICIAN:  Pietro Cassis. Alvan Dame, M.D.  DATE OF BIRTH:  Nov 14, 1945  DATE OF PROCEDURE:  10/08/2014 DATE OF DISCHARGE:                              OPERATIVE REPORT   PREOPERATIVE DIAGNOSIS:  Status post left partial knee arthroplasty with new-onset acute swelling, rule out infection versus inflammatory arthropathy.  POSTOPERATIVE DIAGNOSIS:  Status post left partial knee arthroplasty with new-onset acute swelling, rule out infection versus inflammatory arthropathy.  PROCEDURE:  Incision and drainage of left knee with poly exchange.  This represented a sharp excisional debridement of about an 8-inch skin incision and subcutaneous synovial tissue.  I then had a non-excisional debridement with 6 L normal saline solution.  I replaced to size 4 mm polyethylene insert to match the left medium femur.  SURGEON:  Pietro Cassis. Alvan Dame, M.D.  ASSISTANT:  Surgical team.  ANESTHESIA:  General plus postoperative injection of capsular tissues.  SPECIMENS:  Fluid from the joint was taken and sampled as he already previously had an aspiration in the office performed.  DRAINS:  One medium Hemovac.  TOURNIQUET TIME:  32 minutes at 250 mmHg.  INDICATIONS FOR PROCEDURE:  Mr. Wyszynski is a 69 year old male with history of a left partial knee arthroplasty performed in May 2014.  He had been doing well with his normal state and quality of life with normal functioning knee until this past week.  He had a prostate procedure through Urology on Monday of last week.  By Wednesday, began having knee pain.  He was seen and evaluated in our office on Friday and was noted to have a large knee effusion and his knee was aspirated.  At that time, this fluid was sent off and identified 70,000 white blood cells, with no culture, no crystals.  Based on these results, I set  him up for surgery today.  Given literature on partial knee arthroplasty, his infection is not entirely clear on the number of white blood cells to determine infection; however, I am going to treat this as an infectious process to try to prevent any complications.  Risk, benefits, and necessity of the procedure were reviewed and discussed.  Consent was obtained for benefit of treatment of this knee condition.  PROCEDURE IN DETAIL:  The patient was brought to operative theater. Once adequate anesthesia, preoperative antibiotics were held until the skin incision.  The left lower extremity was then prepped and draped in sterile fashion.  Time-out was performed identifying the patient, planned procedure, and extremity.  The leg was exsanguinated, tourniquet elevated to 250 mmHg.  A skin incision was made and extended proximal and distal, allowed for further exposure for synovectomy and synovial debridement.  Ancef was given at this time.  Note that vancomycin was also given following this, a bit later in the case.  Following an arthrotomy, there was inflammatory appearing fluid in the knee which we sampled again and sent as swabs for Gram stain, culture, anaerobic and aerobic.  At this point, I further opened up the knee, performed medial and lateral synovectomies.  His  patellofemoral cartilage surface still had healthy appearance without evidence of any complicating features.  I removed the old polyethylene insert to allow for further debridement of the medial and posterior aspect of the knee.  Following this, I irrigated the knee with 6 L normal saline solution with pulse lavage. Once this was completed, I regloved as did my scrub tech and we opened up the new polyethylene insert and snapped this into the knee.  At this point, I reapproximated extensor mechanism with the knee in flexion using #1 PDS.  The remainder of the wound was closed with 2-0 Vicryl and running 3-0 Monocryl.   The knee was then cleaned, dried, and dressed sterilely using Dermabond and Aquacel dressing.  Please note that I did place a medium Hemovac drain in the suprapatellar pouch exiting out at the superior lateral aspect of the knee.  The drain site was then dressed separately with Tegaderm and gauze.  The knee was placed in an Ace wrap.  He was then awoken from anesthesia and brought to the recovery room in stable condition, tolerating the procedure well.     Pietro Cassis Alvan Dame, M.D.     MDO/MEDQ  D:  10/08/2014  T:  10/09/2014  Job:  [id number]

## 2014-10-09 NOTE — Progress Notes (Signed)
     Subjective: 1 Day Post-Op Procedure(s) (LRB): IRRIGATION AND DEBRIDEMENT LEFT UNI KNEE ARTHOPLASTY  WITH POLY EXCHANGE (Left)   Patient reports pain as moderate, pain controlled. No events throughout the night. Discussed getting a PICC line today  Objective:   VITALS:   Filed Vitals:   10/09/14 1355  BP: 115/71  Pulse: 69  Temp: 97.5 F (36.4 C)  Resp: 18    Dorsiflexion/Plantar flexion intact Incision: dressing C/D/I No cellulitis present Compartment soft  LABS  Recent Labs  10/08/14 1440 10/09/14 0455  HGB 14.6 12.9*  HCT 42.1 38.6*  WBC 16.2* 15.1*  PLT 242 241     Recent Labs  10/08/14 1440 10/09/14 0455  NA 140 136  K 4.5 4.2  BUN 23 24*  CREATININE 0.89 0.82  GLUCOSE 118* 162*     Assessment/Plan: 1 Day Post-Op Procedure(s) (LRB): IRRIGATION AND DEBRIDEMENT LEFT UNI KNEE ARTHOPLASTY  WITH POLY EXCHANGE (Left)   Advance diet Up with therapy D/C IV fluids Discharge home with home health eventually   West Pugh. Hester Joslin   PAC  10/09/2014, 2:29 PM

## 2014-10-10 DIAGNOSIS — T8454XD Infection and inflammatory reaction due to internal left knee prosthesis, subsequent encounter: Secondary | ICD-10-CM | POA: Diagnosis not present

## 2014-10-10 DIAGNOSIS — Z452 Encounter for adjustment and management of vascular access device: Secondary | ICD-10-CM | POA: Diagnosis not present

## 2014-10-10 DIAGNOSIS — F419 Anxiety disorder, unspecified: Secondary | ICD-10-CM | POA: Diagnosis not present

## 2014-10-10 DIAGNOSIS — Z5181 Encounter for therapeutic drug level monitoring: Secondary | ICD-10-CM | POA: Diagnosis not present

## 2014-10-10 DIAGNOSIS — Z87891 Personal history of nicotine dependence: Secondary | ICD-10-CM | POA: Diagnosis not present

## 2014-10-10 DIAGNOSIS — E785 Hyperlipidemia, unspecified: Secondary | ICD-10-CM | POA: Diagnosis not present

## 2014-10-10 DIAGNOSIS — E119 Type 2 diabetes mellitus without complications: Secondary | ICD-10-CM | POA: Diagnosis not present

## 2014-10-10 LAB — CBC
HEMATOCRIT: 36 % — AB (ref 39.0–52.0)
Hemoglobin: 12 g/dL — ABNORMAL LOW (ref 13.0–17.0)
MCH: 30.3 pg (ref 26.0–34.0)
MCHC: 33.3 g/dL (ref 30.0–36.0)
MCV: 90.9 fL (ref 78.0–100.0)
PLATELETS: 264 10*3/uL (ref 150–400)
RBC: 3.96 MIL/uL — ABNORMAL LOW (ref 4.22–5.81)
RDW: 13.2 % (ref 11.5–15.5)
WBC: 14.4 10*3/uL — ABNORMAL HIGH (ref 4.0–10.5)

## 2014-10-10 LAB — BASIC METABOLIC PANEL
Anion gap: 6 (ref 5–15)
BUN: 26 mg/dL — ABNORMAL HIGH (ref 6–23)
CHLORIDE: 103 meq/L (ref 96–112)
CO2: 26 mmol/L (ref 19–32)
Calcium: 8.4 mg/dL (ref 8.4–10.5)
Creatinine, Ser: 0.85 mg/dL (ref 0.50–1.35)
GFR calc Af Amer: >90 mL/min (ref >90)
GFR, EST NON AFRICAN AMERICAN: 88 mL/min — AB (ref >90)
GLUCOSE: 163 mg/dL — AB (ref 70–99)
Potassium: 4.7 mmol/L (ref 3.5–5.1)
SODIUM: 135 mmol/L (ref 135–145)

## 2014-10-10 LAB — GLUCOSE, CAPILLARY
GLUCOSE-CAPILLARY: 128 mg/dL — AB (ref 70–99)
Glucose-Capillary: 126 mg/dL — ABNORMAL HIGH (ref 70–99)

## 2014-10-10 MED ORDER — SODIUM CHLORIDE 0.9 % IV SOLN: 2.0000 g | Freq: Three times a day (TID) | INTRAVENOUS | Status: AC

## 2014-10-10 MED ORDER — DSS 100 MG PO CAPS: 100.0000 mg | ORAL_CAPSULE | Freq: Two times a day (BID) | ORAL | Status: DC

## 2014-10-10 MED ORDER — OXYCODONE HCL 5 MG PO TABS: 5.0000 mg | ORAL_TABLET | ORAL | Status: DC | PRN

## 2014-10-10 MED ORDER — SODIUM CHLORIDE 0.9 % IJ SOLN: 10.0000 mL | INTRAMUSCULAR | Status: DC | PRN

## 2014-10-10 MED ORDER — FERROUS SULFATE 325 (65 FE) MG PO TABS: 325.0000 mg | ORAL_TABLET | Freq: Three times a day (TID) | ORAL | Status: DC

## 2014-10-10 MED ORDER — POLYETHYLENE GLYCOL 3350 17 G PO PACK: 17.0000 g | PACK | Freq: Two times a day (BID) | ORAL | Status: DC

## 2014-10-10 MED ORDER — TIZANIDINE HCL 4 MG PO TABS: 4.0000 mg | ORAL_TABLET | Freq: Four times a day (QID) | ORAL | Status: DC | PRN

## 2014-10-10 MED ORDER — ASPIRIN 325 MG PO TBEC: 325.0000 mg | DELAYED_RELEASE_TABLET | Freq: Two times a day (BID) | ORAL | Status: AC

## 2014-10-10 NOTE — Progress Notes (Signed)
Physical Therapy Treatment Patient Details Name: MARRION ACCOMANDO MRN: [id number] DOB: July 17, 1946 Today's Date: 10/10/2014    History of Present Illness 69 yo male s/p I&D and poly exchange on L Knee 10/08/14. hx of L UKR 01/2013, DM    PT Comments    Improved mobility and activity tolerance this session. Practiced ambulation and exercises. Pt denied need to practice stair negotiation. Verbally reviewed proper stair negotiation technique. Pt states that he prefers to sleep upstairs once home. Possible d/c home later today if able. Will issue HEP for pt to perform in case HH does not come out right away.  Follow Up Recommendations  Home health PT;Supervision - Intermittent     Equipment Recommendations  None recommended by PT (pt states he has access to walker and cane at home. Recommended walker use for ambulation.)    Recommendations for Other Services       Precautions / Restrictions Precautions Precautions: Fall;Knee Restrictions Weight Bearing Restrictions: No LLE Weight Bearing: Weight bearing as tolerated    Mobility  Bed Mobility               General bed mobility comments: pt oob in recliner  Transfers Overall transfer level: Needs assistance Equipment used: Rolling walker (2 wheeled) Transfers: Sit to/from Stand Sit to Stand: Supervision         General transfer comment: VCs safety, technique, hand placement.   Ambulation/Gait Ambulation/Gait assistance: Min guard Ambulation Distance (Feet): 250 Feet Assistive device: Rolling walker (2 wheeled) Gait Pattern/deviations: Step-through pattern;Decreased stride length;Decreased step length - left     General Gait Details: Improved ability to WB on L today. VCs safety, pacing-pt tends to move quickly   Stairs Stairs:  pt denied need to practice steps. Able to verbalize proper technique with use of rail +cane to ascend. Pt states he has been descending steps backwards prior to admission. Cautioned against  this and verbalized/demonstrated correct forwards technique. Pt states he will try forwards when deciding but if it hurts he will go back to going down backwards.           Wheelchair Mobility    Modified Rankin (Stroke Patients Only)       Balance                                    Cognition Arousal/Alertness: Awake/alert Behavior During Therapy: WFL for tasks assessed/performed Overall Cognitive Status: Within Functional Limits for tasks assessed                      Exercises General Exercises - Lower Extremity Ankle Circles/Pumps: AROM;Both;10 reps;Seated Quad Sets: AROM;Both;10 reps;Seated Heel Slides: AAROM;Left;10 reps;Seated Hip ABduction/ADduction: AROM;Left;10 reps;Seated Straight Leg Raises: AAROM;Left;10 reps;Seated    General Comments        Pertinent Vitals/Pain Pain Assessment: 0-10 Pain Score: 4  Pain Location: L knee/thigh Pain Descriptors / Indicators: Aching;Sharp;Sore Pain Intervention(s): Monitored during session;Ice applied    Home Living                      Prior Function            PT Goals (current goals can now be found in the care plan section) Progress towards PT goals: Progressing toward goals    Frequency  Min 5X/week    PT Plan Current plan remains appropriate    Co-evaluation  End of Session   Activity Tolerance: Patient tolerated treatment well Patient left: in chair;with call bell/phone within reach     Time: 1007-1032 PT Time Calculation (min) (ACUTE ONLY): 25 min  Charges:  $Gait Training: 8-22 mins $Therapeutic Exercise: 8-22 mins                    G Codes:      Weston Anna, MPT Pager: [phone]

## 2014-10-10 NOTE — Progress Notes (Signed)
     Subjective: 2 Days Post-Op Procedure(s) (LRB): IRRIGATION AND DEBRIDEMENT LEFT UNI KNEE ARTHOPLASTY  WITH POLY EXCHANGE (Left)   Patient reports pain as moderate, pain controlled. Feels that the pain is getting better than prior to surgery. He's able to bear weight and move the leg side to side.  Objective:   VITALS:   Filed Vitals:   10/10/14  BP: 135/71  Pulse: 64  Temp: 97.6 F (36.4 C)   Resp: 20    Dorsiflexion/Plantar flexion intact Incision: dressing C/D/I No cellulitis present Compartment soft  LABS  Recent Labs  10/08/14 1440 10/09/14 0455 10/10/14 0520  HGB 14.6 12.9* 12.0*  HCT 42.1 38.6* 36.0*  WBC 16.2* 15.1* 14.4*  PLT 242 241 264     Recent Labs  10/08/14 1440 10/09/14 0455 10/10/14 0520  NA 140 136 135  K 4.5 4.2 4.7  BUN 23 24* 26*  CREATININE 0.89 0.82 0.85  GLUCOSE 118* 162* 163*     Assessment/Plan: 2 Days Post-Op Procedure(s) (LRB): IRRIGATION AND DEBRIDEMENT LEFT UNI KNEE ARTHOPLASTY  WITH POLY EXCHANGE (Left) Up with therapy Discharge home with home health RN and PT Follow up in 2 weeks at [facility]. Follow up with OLIN,Tiea Manninen D in 2 weeks.  Contact information:  [facility] [address], [address] [place]   PAC  10/10/2014, 10:12 AM

## 2014-10-10 NOTE — Progress Notes (Signed)
Occupational Therapy Treatment Patient Details Name: LAURENCE CROFFORD MRN: [id number] DOB: 1946/01/11 Today's Date: 10/10/2014    History of present illness 69 yo male s/p I&D and poly exchange on L Knee 10/08/14. hx of L UKR 01/2013, DM   OT comments  Pt supposed to d/c today. Educated further on Ae options, practiced with it, and he plans to obtain sponge and sock aid.   Follow Up Recommendations  No OT follow up;Supervision/Assistance - 24 hour    Equipment Recommendations  None recommended by OT    Recommendations for Other Services      Precautions / Restrictions Precautions Precautions: Fall;Knee Restrictions Weight Bearing Restrictions: No LLE Weight Bearing: Weight bearing as tolerated       Mobility Bed Mobility Overal bed mobility: Needs Assistance Bed Mobility: Supine to Sit     Supine to sit: Supervision Sit to supine: Supervision   General bed mobility comments: pt oob in recliner  Transfers              Balance                                   ADL                                         General ADL Comments: Educated pt on sock aid option. Pt practiced with sock aid at E[facility] and used with supervision. Pt also doffed sock with reacher with supervision. 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 explained coverage and where to obtain. Pt states he has a 3in1 but also has a higher toilet. He also has a shower chair. Reviewed sequence for shower transfer and pt verbalized understanding.       Vision                     Perception     Praxis      Cognition   Behavior During Therapy: WFL for tasks assessed/performed Overall Cognitive Status: Within Functional Limits for tasks assessed                       Extremity/Trunk Assessment               Exercises    Shoulder Instructions       General Comments      Pertinent Vitals/ Pain       Pain  Assessment: 0-10 Pain Score: 3  Pain Location: L knee Pain Descriptors / Indicators: Aching Pain Intervention(s): Repositioned  Home Living                                          Prior Functioning/Environment              Frequency Min 2X/week     Progress Toward Goals  OT Goals(current goals can now be found in the care plan section)  Progress towards OT goals: Progressing toward goals     Plan Discharge plan remains appropriate    Co-evaluation                 End of Session     Activity Tolerance  Patient tolerated treatment well   Patient Left in bed;with call bell/phone within reach   Nurse Communication          Time: 1210-1220 OT Time Calculation (min): 10 min  Charges: OT General Charges $OT Visit: 1 Procedure OT Treatments $Self Care/Home Management : 8-22 mins  Jules Schick  [phone] 10/10/2014, 1:01 PM

## 2014-10-11 DIAGNOSIS — R269 Unspecified abnormalities of gait and mobility: Secondary | ICD-10-CM | POA: Diagnosis not present

## 2014-10-12 LAB — BODY FLUID CULTURE: CULTURE: NO GROWTH

## 2014-10-13 LAB — ANAEROBIC CULTURE

## 2014-10-14 ENCOUNTER — Telehealth: Payer: Self-pay | Admitting: *Deleted

## 2014-10-14 DIAGNOSIS — Z87891 Personal history of nicotine dependence: Secondary | ICD-10-CM | POA: Diagnosis not present

## 2014-10-14 DIAGNOSIS — Z791 Long term (current) use of non-steroidal anti-inflammatories (NSAID): Secondary | ICD-10-CM | POA: Diagnosis not present

## 2014-10-14 DIAGNOSIS — T8454XD Infection and inflammatory reaction due to internal left knee prosthesis, subsequent encounter: Secondary | ICD-10-CM | POA: Diagnosis not present

## 2014-10-14 DIAGNOSIS — E119 Type 2 diabetes mellitus without complications: Secondary | ICD-10-CM | POA: Diagnosis not present

## 2014-10-14 DIAGNOSIS — F419 Anxiety disorder, unspecified: Secondary | ICD-10-CM | POA: Diagnosis not present

## 2014-10-14 DIAGNOSIS — E785 Hyperlipidemia, unspecified: Secondary | ICD-10-CM | POA: Diagnosis not present

## 2014-10-14 DIAGNOSIS — Z5181 Encounter for therapeutic drug level monitoring: Secondary | ICD-10-CM | POA: Diagnosis not present

## 2014-10-14 DIAGNOSIS — Z452 Encounter for adjustment and management of vascular access device: Secondary | ICD-10-CM | POA: Diagnosis not present

## 2014-10-14 NOTE — Telephone Encounter (Signed)
Received fax from [facility] care mgmt stating that pt was admitted to [facility] with level of care [place] with attending physician DR. MATTHEW OLIN,MD  Admission: 10/08/14 Discharged/Transferred: 10/10/14 to home under care of [facility]  Principal DX: T85.54XA-infect/inflm reaction due to internal knee prosth,limited

## 2014-10-14 NOTE — Discharge Summary (Signed)
Physician Discharge Summary  Patient ID: Jimmy Bradshaw MRN: [id number] DOB/AGE: 11-13-1945 69 y.o.  Admit date: 10/08/2014 Discharge date: 10/10/2014   Procedures:  Procedure(s) (LRB): IRRIGATION AND DEBRIDEMENT LEFT UNI KNEE ARTHOPLASTY  WITH POLY EXCHANGE (Left)  Attending Physician:  Dr. Paralee Cancel   Admission Diagnoses:  Acute onset of left knee pain and swelling.  Discharge Diagnoses:  Active Problems:   Knee pain, acute  Past Medical History  Diagnosis Date  . Hyperlipidemia   . Anxiety   . H/O: gout   . History of colon polyps   . BPH (benign prostatic hypertrophy)   . Bladder neck obstruction   . Type 2 diabetes mellitus   . Frequency of urination   . Urgency of urination   . Nocturia   . Arthritis     HANDS,  SHOULDERS  . Wears hearing aid     BILATERAL    HPI:     The patient is a pleasant 69 year old male who presents for follow-up evaluation of left knee pain and swelling. He was seen as an acute visit on Friday, the 15th of January. He is a patient of Dr. Aurea Graff. He had a unicompartment partial knee replacement on Feb 05, 2013. He had no real problems with it until presenting to the office and see Dr. Drema Dallas on January 15th. He reports about 24 hours of worsening knee pain and swelling. No accidents, trauma, or falls. It just stiffened up on him. He came in for an acute visit.  Complicating the situation he had prostate surgery on Monday, January 11th. It sounds like they just shaved down the prostate that was enlarged from BPH. He took five days of b.i.d. Cipro.  When seen on January 15th the left knee was aspirated. There was cloudy, yellowish, greenish material that was aspirated. It seemed consistent with gout. They went ahead and injected him with a combination of Marcaine, Lidocaine, and Kenalog. He says that he did well Friday, the 15th into Saturday the 16th but his pain came back yesterday. It was quite severe. He thinks it has  reaccumulated to a certain degree. We did send the fluid off for culture sensitivey, gram stain, cell count, and crystals. Preliminary report on the culture shows no growth x2 days. Gram stain just showed some WBC's. There were no crystals seen. There were 70,400 WBC's with 90% neutrophils. He reports he is not as bad as he was Friday but yesterday was quite diffiult and he seems to be getting worse.  Various options were discussed and the patient wishes to proceed with an I&D of the left knee with possible poly exchange. Risks, benefits and expectations were discussed with the patient. Risks including but not limited to the risk of anesthesia, blood clots, nerve damage, blood vessel damage, failure of the prosthesis, infection and up to and including death. Patient understand the risks, benefits and expectations and wishes to proceed with surgery.   PCP: Vic Blackbird, MD   Discharged Condition: good  Hospital Course:  Patient underwent the above stated procedure on 10/08/2014. Patient tolerated the procedure well and brought to the recovery room in good condition and subsequently to the floor.  POD #1 BP: 115/71 ; Pulse: 69 ; Temp: 97.5 F (36.4 C) ; Resp: 18 Patient reports pain as moderate, pain controlled. No events throughout the night. Discussed getting a PICC line today which was placed. Dorsiflexion/plantar flexion intact, incision: dressing C/D/I, no cellulitis present and compartment soft.   LABS  Basename    HGB  12.9  HCT  38.6   POD #2  BP: 135/71 ; Pulse: 64 ; Temp: 97.6 F (36.4 C) ; Resp: 20 Patient reports pain as moderate, pain controlled. Feels that the pain is getting better than prior to surgery. He's able to bear weight and move the leg side to side.  We have discussed antibiotic use for 4 weeks.  He is interested in getting home today.  We have discussed that as long as the [facility] is set up as well as the antibiotics that he can get home.   LABS  Basename      HGB  12.0  HCT  36.0    Discharge Exam: General appearance: alert, cooperative and no distress Extremities: Homans sign is negative, no sign of DVT, no edema, redness or tenderness in the calves or thighs and no ulcers, gangrene or trophic changes  Disposition: Home with follow up in 2 weeks   Follow-up Information    Follow up with Mauri Pole, MD. Schedule an appointment as soon as possible for a visit in 2 weeks.   Specialty:  Orthopedic Surgery   Contact information:   [address] [place] [address] [phone]       Discharge Instructions    Call MD / Call 911    Complete by:  As directed   If you experience chest pain or shortness of breath, CALL 911 and be transported to the hospital emergency room.  If you develope a fever above 101 F, pus (white drainage) or increased drainage or redness at the wound, or calf pain, call your surgeon's office.     Change dressing    Complete by:  As directed   Maintain surgical dressing until follow up in the clinic. If the edges start to pull up, may reinforce with tape. If the dressing is no longer working, may remove and cover with gauze and tape, but must keep the area dry and clean.  Call with any questions or concerns.     Constipation Prevention    Complete by:  As directed   Drink plenty of fluids.  Prune juice may be helpful.  You may use a stool softener, such as Colace (over the counter) 100 mg twice a day.  Use MiraLax (over the counter) for constipation as needed.     Diet - low sodium heart healthy    Complete by:  As directed      Discharge instructions    Complete by:  As directed   Maintain surgical dressing until follow up in the clinic. If the edges start to pull up, may reinforce with tape. If the dressing is no longer working, may remove and cover with gauze and tape, but must keep the area dry and clean.  Follow up in 2 weeks at [facility] Call with any questions or concerns.      Increase activity slowly as tolerated    Complete by:  As directed      TED hose    Complete by:  As directed   Use stockings (TED hose) for 2 weeks on both leg(s).  You may remove them at night for sleeping.     Weight bearing as tolerated    Complete by:  As directed   Laterality:  left  Extremity:  Lower             Medication List    STOP taking these medications  cephALEXin 500 MG capsule  Commonly known as:  KEFLEX     ciprofloxacin 500 MG tablet  Commonly known as:  CIPRO     indomethacin 50 MG capsule  Commonly known as:  INDOCIN     oxyCODONE-acetaminophen 5-325 MG per tablet  Commonly known as:  PERCOCET/ROXICET      TAKE these medications        aspirin 325 MG EC tablet  Take 1 tablet (325 mg total) by mouth 2 (two) times daily.     buPROPion 300 MG 24 hr tablet  Commonly known as:  WELLBUTRIN XL  TAKE 1 TABLET EVERY MORNING     ceFAZolin 2 g in sodium chloride 0.9 % 50 mL ivpb  Inject 2 g into the vein every 8 (eight) hours.     DSS 100 MG Caps  Take 100 mg by mouth 2 (two) times daily.     ferrous sulfate 325 (65 FE) MG tablet  Take 1 tablet (325 mg total) by mouth 3 (three) times daily after meals.     lisinopril 2.5 MG tablet  Commonly known as:  PRINIVIL,ZESTRIL  Take 2.5 mg by mouth daily with breakfast.     metFORMIN 500 MG 24 hr tablet  Commonly known as:  GLUCOPHAGE XR  Take 1 tablet (500 mg total) by mouth daily with breakfast.     oxyCODONE 5 MG immediate release tablet  Commonly known as:  Oxy IR/ROXICODONE  Take 1-3 tablets (5-15 mg total) by mouth every 4 (four) hours as needed for moderate pain or severe pain.     polyethylene glycol packet  Commonly known as:  MIRALAX / GLYCOLAX  Take 17 g by mouth 2 (two) times daily.     pravastatin 40 MG tablet  Commonly known as:  PRAVACHOL  Take 80 mg by mouth at bedtime.     silodosin 8 MG Caps capsule  Commonly known as:  RAPAFLO  Take 8 mg by mouth daily with  breakfast.     sodium chloride 0.9 % injection  10-40 mLs by Intracatheter route as needed (flush).     tiZANidine 4 MG tablet  Commonly known as:  ZANAFLEX  Take 1 tablet (4 mg total) by mouth every 6 (six) hours as needed for muscle spasms.         Signed: [place] Pugh. Tyiana Dombkowski   PA-C  10/14/2014, 2:55 PM

## 2014-10-14 NOTE — Telephone Encounter (Signed)
noted 

## 2014-10-16 DIAGNOSIS — E119 Type 2 diabetes mellitus without complications: Secondary | ICD-10-CM | POA: Diagnosis not present

## 2014-10-16 DIAGNOSIS — Z87891 Personal history of nicotine dependence: Secondary | ICD-10-CM | POA: Diagnosis not present

## 2014-10-16 DIAGNOSIS — F419 Anxiety disorder, unspecified: Secondary | ICD-10-CM | POA: Diagnosis not present

## 2014-10-16 DIAGNOSIS — E785 Hyperlipidemia, unspecified: Secondary | ICD-10-CM | POA: Diagnosis not present

## 2014-10-16 DIAGNOSIS — Z5181 Encounter for therapeutic drug level monitoring: Secondary | ICD-10-CM | POA: Diagnosis not present

## 2014-10-16 DIAGNOSIS — Z452 Encounter for adjustment and management of vascular access device: Secondary | ICD-10-CM | POA: Diagnosis not present

## 2014-10-16 DIAGNOSIS — T8454XD Infection and inflammatory reaction due to internal left knee prosthesis, subsequent encounter: Secondary | ICD-10-CM | POA: Diagnosis not present

## 2014-10-18 DIAGNOSIS — Z87891 Personal history of nicotine dependence: Secondary | ICD-10-CM | POA: Diagnosis not present

## 2014-10-18 DIAGNOSIS — T8454XD Infection and inflammatory reaction due to internal left knee prosthesis, subsequent encounter: Secondary | ICD-10-CM | POA: Diagnosis not present

## 2014-10-18 DIAGNOSIS — Z452 Encounter for adjustment and management of vascular access device: Secondary | ICD-10-CM | POA: Diagnosis not present

## 2014-10-18 DIAGNOSIS — E785 Hyperlipidemia, unspecified: Secondary | ICD-10-CM | POA: Diagnosis not present

## 2014-10-18 DIAGNOSIS — Z5181 Encounter for therapeutic drug level monitoring: Secondary | ICD-10-CM | POA: Diagnosis not present

## 2014-10-18 DIAGNOSIS — E119 Type 2 diabetes mellitus without complications: Secondary | ICD-10-CM | POA: Diagnosis not present

## 2014-10-18 DIAGNOSIS — F419 Anxiety disorder, unspecified: Secondary | ICD-10-CM | POA: Diagnosis not present

## 2014-10-21 DIAGNOSIS — F419 Anxiety disorder, unspecified: Secondary | ICD-10-CM | POA: Diagnosis not present

## 2014-10-21 DIAGNOSIS — E785 Hyperlipidemia, unspecified: Secondary | ICD-10-CM | POA: Diagnosis not present

## 2014-10-21 DIAGNOSIS — Z791 Long term (current) use of non-steroidal anti-inflammatories (NSAID): Secondary | ICD-10-CM | POA: Diagnosis not present

## 2014-10-21 DIAGNOSIS — Z5181 Encounter for therapeutic drug level monitoring: Secondary | ICD-10-CM | POA: Diagnosis not present

## 2014-10-21 DIAGNOSIS — E119 Type 2 diabetes mellitus without complications: Secondary | ICD-10-CM | POA: Diagnosis not present

## 2014-10-21 DIAGNOSIS — T8454XD Infection and inflammatory reaction due to internal left knee prosthesis, subsequent encounter: Secondary | ICD-10-CM | POA: Diagnosis not present

## 2014-10-21 DIAGNOSIS — Z452 Encounter for adjustment and management of vascular access device: Secondary | ICD-10-CM | POA: Diagnosis not present

## 2014-10-21 DIAGNOSIS — Z87891 Personal history of nicotine dependence: Secondary | ICD-10-CM | POA: Diagnosis not present

## 2014-10-24 DIAGNOSIS — T8454XD Infection and inflammatory reaction due to internal left knee prosthesis, subsequent encounter: Secondary | ICD-10-CM | POA: Diagnosis not present

## 2014-10-24 DIAGNOSIS — F419 Anxiety disorder, unspecified: Secondary | ICD-10-CM | POA: Diagnosis not present

## 2014-10-24 DIAGNOSIS — Z87891 Personal history of nicotine dependence: Secondary | ICD-10-CM | POA: Diagnosis not present

## 2014-10-24 DIAGNOSIS — Z452 Encounter for adjustment and management of vascular access device: Secondary | ICD-10-CM | POA: Diagnosis not present

## 2014-10-24 DIAGNOSIS — Z5181 Encounter for therapeutic drug level monitoring: Secondary | ICD-10-CM | POA: Diagnosis not present

## 2014-10-24 DIAGNOSIS — E785 Hyperlipidemia, unspecified: Secondary | ICD-10-CM | POA: Diagnosis not present

## 2014-10-24 DIAGNOSIS — E119 Type 2 diabetes mellitus without complications: Secondary | ICD-10-CM | POA: Diagnosis not present

## 2014-10-25 DIAGNOSIS — R269 Unspecified abnormalities of gait and mobility: Secondary | ICD-10-CM | POA: Diagnosis not present

## 2014-10-26 DIAGNOSIS — Z5181 Encounter for therapeutic drug level monitoring: Secondary | ICD-10-CM | POA: Diagnosis not present

## 2014-10-26 DIAGNOSIS — E119 Type 2 diabetes mellitus without complications: Secondary | ICD-10-CM | POA: Diagnosis not present

## 2014-10-26 DIAGNOSIS — Z452 Encounter for adjustment and management of vascular access device: Secondary | ICD-10-CM | POA: Diagnosis not present

## 2014-10-26 DIAGNOSIS — T8454XD Infection and inflammatory reaction due to internal left knee prosthesis, subsequent encounter: Secondary | ICD-10-CM | POA: Diagnosis not present

## 2014-10-26 DIAGNOSIS — Z87891 Personal history of nicotine dependence: Secondary | ICD-10-CM | POA: Diagnosis not present

## 2014-10-26 DIAGNOSIS — F419 Anxiety disorder, unspecified: Secondary | ICD-10-CM | POA: Diagnosis not present

## 2014-10-26 DIAGNOSIS — E785 Hyperlipidemia, unspecified: Secondary | ICD-10-CM | POA: Diagnosis not present

## 2014-10-28 DIAGNOSIS — F419 Anxiety disorder, unspecified: Secondary | ICD-10-CM | POA: Diagnosis not present

## 2014-10-28 DIAGNOSIS — E785 Hyperlipidemia, unspecified: Secondary | ICD-10-CM | POA: Diagnosis not present

## 2014-10-28 DIAGNOSIS — Z87891 Personal history of nicotine dependence: Secondary | ICD-10-CM | POA: Diagnosis not present

## 2014-10-28 DIAGNOSIS — Z791 Long term (current) use of non-steroidal anti-inflammatories (NSAID): Secondary | ICD-10-CM | POA: Diagnosis not present

## 2014-10-28 DIAGNOSIS — Z5181 Encounter for therapeutic drug level monitoring: Secondary | ICD-10-CM | POA: Diagnosis not present

## 2014-10-28 DIAGNOSIS — T8454XD Infection and inflammatory reaction due to internal left knee prosthesis, subsequent encounter: Secondary | ICD-10-CM | POA: Diagnosis not present

## 2014-10-28 DIAGNOSIS — E119 Type 2 diabetes mellitus without complications: Secondary | ICD-10-CM | POA: Diagnosis not present

## 2014-10-28 DIAGNOSIS — Z452 Encounter for adjustment and management of vascular access device: Secondary | ICD-10-CM | POA: Diagnosis not present

## 2014-10-30 DIAGNOSIS — E785 Hyperlipidemia, unspecified: Secondary | ICD-10-CM | POA: Diagnosis not present

## 2014-10-30 DIAGNOSIS — Z452 Encounter for adjustment and management of vascular access device: Secondary | ICD-10-CM | POA: Diagnosis not present

## 2014-10-30 DIAGNOSIS — T8454XD Infection and inflammatory reaction due to internal left knee prosthesis, subsequent encounter: Secondary | ICD-10-CM | POA: Diagnosis not present

## 2014-10-30 DIAGNOSIS — Z87891 Personal history of nicotine dependence: Secondary | ICD-10-CM | POA: Diagnosis not present

## 2014-10-30 DIAGNOSIS — Z5181 Encounter for therapeutic drug level monitoring: Secondary | ICD-10-CM | POA: Diagnosis not present

## 2014-10-30 DIAGNOSIS — E119 Type 2 diabetes mellitus without complications: Secondary | ICD-10-CM | POA: Diagnosis not present

## 2014-10-30 DIAGNOSIS — F419 Anxiety disorder, unspecified: Secondary | ICD-10-CM | POA: Diagnosis not present

## 2014-10-31 DIAGNOSIS — N401 Enlarged prostate with lower urinary tract symptoms: Secondary | ICD-10-CM | POA: Diagnosis not present

## 2014-11-03 DIAGNOSIS — E119 Type 2 diabetes mellitus without complications: Secondary | ICD-10-CM | POA: Diagnosis not present

## 2014-11-03 DIAGNOSIS — F419 Anxiety disorder, unspecified: Secondary | ICD-10-CM | POA: Diagnosis not present

## 2014-11-03 DIAGNOSIS — E785 Hyperlipidemia, unspecified: Secondary | ICD-10-CM | POA: Diagnosis not present

## 2014-11-03 DIAGNOSIS — Z452 Encounter for adjustment and management of vascular access device: Secondary | ICD-10-CM | POA: Diagnosis not present

## 2014-11-03 DIAGNOSIS — Z87891 Personal history of nicotine dependence: Secondary | ICD-10-CM | POA: Diagnosis not present

## 2014-11-03 DIAGNOSIS — T8454XD Infection and inflammatory reaction due to internal left knee prosthesis, subsequent encounter: Secondary | ICD-10-CM | POA: Diagnosis not present

## 2014-11-03 DIAGNOSIS — Z791 Long term (current) use of non-steroidal anti-inflammatories (NSAID): Secondary | ICD-10-CM | POA: Diagnosis not present

## 2014-11-03 DIAGNOSIS — Z5181 Encounter for therapeutic drug level monitoring: Secondary | ICD-10-CM | POA: Diagnosis not present

## 2014-11-05 DIAGNOSIS — F419 Anxiety disorder, unspecified: Secondary | ICD-10-CM | POA: Diagnosis not present

## 2014-11-05 DIAGNOSIS — E785 Hyperlipidemia, unspecified: Secondary | ICD-10-CM | POA: Diagnosis not present

## 2014-11-05 DIAGNOSIS — T8454XD Infection and inflammatory reaction due to internal left knee prosthesis, subsequent encounter: Secondary | ICD-10-CM | POA: Diagnosis not present

## 2014-11-05 DIAGNOSIS — Z452 Encounter for adjustment and management of vascular access device: Secondary | ICD-10-CM | POA: Diagnosis not present

## 2014-11-05 DIAGNOSIS — E119 Type 2 diabetes mellitus without complications: Secondary | ICD-10-CM | POA: Diagnosis not present

## 2014-11-05 DIAGNOSIS — Z87891 Personal history of nicotine dependence: Secondary | ICD-10-CM | POA: Diagnosis not present

## 2014-11-05 DIAGNOSIS — Z5181 Encounter for therapeutic drug level monitoring: Secondary | ICD-10-CM | POA: Diagnosis not present

## 2014-11-06 ENCOUNTER — Encounter (INDEPENDENT_AMBULATORY_CARE_PROVIDER_SITE_OTHER): Payer: Commercial Managed Care - HMO

## 2014-11-06 ENCOUNTER — Encounter: Payer: Self-pay | Admitting: Physician Assistant

## 2014-11-06 ENCOUNTER — Encounter: Payer: Self-pay | Admitting: *Deleted

## 2014-11-06 ENCOUNTER — Encounter: Payer: Self-pay | Admitting: Family Medicine

## 2014-11-06 ENCOUNTER — Other Ambulatory Visit: Payer: Self-pay | Admitting: Family Medicine

## 2014-11-06 ENCOUNTER — Ambulatory Visit (INDEPENDENT_AMBULATORY_CARE_PROVIDER_SITE_OTHER): Payer: Commercial Managed Care - HMO | Admitting: Family Medicine

## 2014-11-06 ENCOUNTER — Ambulatory Visit (INDEPENDENT_AMBULATORY_CARE_PROVIDER_SITE_OTHER): Payer: Commercial Managed Care - HMO | Admitting: Physician Assistant

## 2014-11-06 VITALS — BP 146/82 | HR 61 | Ht 71.5 in | Wt 223.2 lb

## 2014-11-06 VITALS — BP 128/74 | HR 68 | Temp 98.0°F | Resp 16 | Ht 71.0 in | Wt 224.0 lb

## 2014-11-06 DIAGNOSIS — E785 Hyperlipidemia, unspecified: Secondary | ICD-10-CM | POA: Diagnosis not present

## 2014-11-06 DIAGNOSIS — I480 Paroxysmal atrial fibrillation: Secondary | ICD-10-CM | POA: Diagnosis not present

## 2014-11-06 DIAGNOSIS — Z8679 Personal history of other diseases of the circulatory system: Secondary | ICD-10-CM | POA: Diagnosis not present

## 2014-11-06 DIAGNOSIS — E119 Type 2 diabetes mellitus without complications: Secondary | ICD-10-CM | POA: Diagnosis not present

## 2014-11-06 DIAGNOSIS — I4891 Unspecified atrial fibrillation: Secondary | ICD-10-CM | POA: Insufficient documentation

## 2014-11-06 NOTE — Patient Instructions (Signed)
Go directly to cardiology for appt You will need fasting labs 1 week for diabetes

## 2014-11-06 NOTE — Progress Notes (Signed)
Patient ID: Jimmy Bradshaw, male   DOB: 09-07-1946, 69 y.o.   MRN: [id number] Preventice verite 30 day cardiac event monitor applied to patient.

## 2014-11-06 NOTE — Patient Instructions (Signed)
Your physician recommends that you continue on your current medications as directed. Please refer to the Current Medication list given to you today.   Your physician has requested that you have an echocardiogram. Echocardiography is a painless test that uses sound waves to create images of your heart. It provides your doctor with information about the size and shape of your heart and how well your heart's chambers and valves are working. This procedure takes approximately one hour. There are no restrictions for this procedure.   Your physician has recommended that you wear an event monitor.30 DAY  Event monitors are medical devices that record the heart's electrical activity. Doctors most often [place] these monitors to diagnose arrhythmias. Arrhythmias are problems with the speed or rhythm of the heartbeat. The monitor is a small, portable device. You can wear one while you do your normal daily activities. This is usually used to diagnose what is causing palpitations/syncope (passing out).   FOLLOW UP 3 MONTHS WITH DR Acie Fredrickson

## 2014-11-06 NOTE — Assessment & Plan Note (Signed)
Patient will be setup to wear a CardioNet monitor for 30 days to see if in fact he is having any atrial fib.  Maybe the stress of surgery at that point and anesthesia triggered atrial fibrillation. Patient is a no complaints to indicate that he's been going in and out of A. Fib.  We will also check a 2-D echocardiogram to see if there were any structural abnormalities.  CHADSVasc of 3 so if he indeed has any afib in the next 30 days, we will need to start a NOAC.

## 2014-11-06 NOTE — Progress Notes (Signed)
Patient ID: Jimmy Bradshaw, male   DOB: [birth year], 69 y.o.   MRN: [id number]    Date:  11/06/2014   ID:  Jimmy Bradshaw, DOB [birth year], MRN [id number]  PCP:  Jimmy Blackbird, MD  Primary Cardiologist:  New([facility])  No chief complaint on file.    History of Present Illness: Jimmy Bradshaw is a 69 y.o. male history of hypertension, hyperlipidemia, type 2 diabetes mellitus, anxiety, BPH. On 09/30/2014 he underwent TURP procedure and then a week later underwent irrigation and debridement of the left knee.  PICC line was placed and he was started on antibiotics for 4 weeks.  One of these surgeries patient supposedly went into atrial fibrillation with a rapid ventricular response. There are no EKGs or telemetry strips which confirmed this nor is there any documentation in the notes; either in the discharge summaries or with anesthesia.   The patient currently denies nausea, vomiting, fever, chest pain, shortness of breath, orthopnea, dizziness, PND, cough, congestion, abdominal pain, hematochezia, melena, lower extremity edema, claudication.  Wt Readings from Last 3 Encounters:  11/06/14 223 lb 3.2 oz (101.243 kg)  11/06/14 224 lb (101.606 kg)  10/08/14 239 lb (108.41 kg)     Past Medical History  Diagnosis Date  . Hyperlipidemia   . Anxiety   . H/O: gout   . History of colon polyps   . BPH (benign prostatic hypertrophy)   . Bladder neck obstruction   . Type 2 diabetes mellitus   . Frequency of urination   . Urgency of urination   . Nocturia   . Arthritis     HANDS,  SHOULDERS  . Wears hearing aid     BILATERAL    Current Outpatient Prescriptions  Medication Sig Dispense Refill  . aspirin EC 325 MG EC tablet Take 1 tablet (325 mg total) by mouth 2 (two) times daily. 60 tablet 0  . buPROPion (WELLBUTRIN XL) 300 MG 24 hr tablet TAKE 1 TABLET EVERY MORNING 90 tablet 0  . ceFAZolin 2 g in sodium chloride 0.9 % 50 mL ivpb Inject 2 g into the vein every 8 (eight) hours. 4200 mL 0  .  docusate sodium 100 MG CAPS Take 100 mg by mouth 2 (two) times daily. 10 capsule 0  . ferrous sulfate 325 (65 FE) MG tablet Take 1 tablet (325 mg total) by mouth 3 (three) times daily after meals.  3  . lisinopril (PRINIVIL,ZESTRIL) 2.5 MG tablet Take 2.5 mg by mouth daily with breakfast.    . metFORMIN (GLUCOPHAGE XR) 500 MG 24 hr tablet Take 1 tablet (500 mg total) by mouth daily with breakfast. 90 tablet 1  . oxyCODONE (OXY IR/ROXICODONE) 5 MG immediate release tablet Take 1-3 tablets (5-15 mg total) by mouth every 4 (four) hours as needed for moderate pain or severe pain. 120 tablet 0  . polyethylene glycol (MIRALAX / GLYCOLAX) packet Take 17 g by mouth 2 (two) times daily. 14 each 0  . pravastatin (PRAVACHOL) 40 MG tablet Take 80 mg by mouth at bedtime.    . silodosin (RAPAFLO) 8 MG CAPS capsule Take 8 mg by mouth daily with breakfast.    . sodium chloride 0.9 % injection 10-40 mLs by Intracatheter route as needed (flush). 5 mL   . tiZANidine (ZANAFLEX) 4 MG tablet Take 1 tablet (4 mg total) by mouth every 6 (six) hours as needed for muscle spasms. 30 tablet 0   No current facility-administered medications for this visit.    Allergies:  Allergies  Allergen Reactions  . Penicillins Rash    Social History:  The patient  reports that he quit smoking about 15 years ago. His smoking use included Cigarettes. He quit after 4 years of use. He has never used smokeless tobacco. He reports that he drinks alcohol. He reports that he does not use illicit drugs.   Family history:   Family History  Problem Relation Age of Onset  . Adopted: Yes    ROS:  Please see the history of present illness.  All other systems reviewed and negative.   PHYSICAL EXAM: VS:  BP 146/82 mmHg  Pulse 61  Ht 5' 11.5" (1.816 m)  Wt 223 lb 3.2 oz (101.243 kg)  BMI 30.70 kg/m2 obese, well developed, in no acute distress HEENT: Pupils are equal round react to light accommodation extraocular movements are intact.    Neck: no JVDNo cervical lymphadenopathy. Cardiac: Regular rate and rhythm without murmurs rubs or gallops. Lungs:  clear to auscultation bilaterally, no wheezing, rhonchi or rales Abd: soft, nontender, positive bowel sounds all quadrants, no hepatosplenomegaly Ext: no lower extremity edema.  2+ radial and dorsalis pedis pulses. Skin: warm and dry Neuro:  Grossly normal  EKG:  Normal sinus rhythm rate 61 bpm.  ASSESSMENT AND PLAN:  Problem List Items Addressed This Visit    History of atrial fibrillation    Patient will be setup to wear a CardioNet monitor for 30 days to see if in fact he is having any atrial fib.  Maybe the stress of surgery at that point and anesthesia triggered atrial fibrillation. Patient is a no complaints to indicate that he's been going in and out of A. Fib.  We will also check a 2-D echocardiogram to see if there were any structural abnormalities.  CHADSVasc of 3 so if he indeed has any afib in the next 30 days, we will need to start a NOAC.         Other Visit Diagnoses    PAF (paroxysmal atrial fibrillation)    -  Primary    Relevant Orders    EKG 12-Lead    2D Echocardiogram without contrast    Cardiac event monitor

## 2014-11-06 NOTE — Progress Notes (Signed)
Patient ID: Jimmy Bradshaw, male   DOB: 1945/10/15, 69 y.o.   MRN: [id number]   Subjective:    Patient ID: Jimmy Bradshaw, male    DOB: 11-17-1945, 69 y.o.   MRN: [id number]  Patient presents for Heart Issues  patient here with concern for paroxysmal atrial fibrillation. He underwent knee Department and clean out on his left knee after he had knee replacement on January 19 at that time anesthesia notes that he went into SVT with heart rate up to 150 he then went into atrial fibrillation. It was recommended cardiology see him but it seems that his heart rate was control for the rest of the stay. He is from working with a physical therapist with [facility] and they have noted that he has been bradycardic at times down into the 50s and then he will go up to the 120s his heart rate is also been very irregular most recently noted yesterday during his therapy session. He has no history of atrial fibrillation or any Jimmy bowel problem. He does have history of diabetes mellitus which has been fairly well controlled. He is completing a round of IV antibiotics via PICC line today and will start on oral antibiotics for the next 3 months. Overall he has not had any chest pain or shortness of breath no fever no weakness with the exception of the rehabilitation to his left knee. He does feel some of the palpitations when his heart rate goes up.    Review Of Systems:  GEN- denies fatigue, fever, weight loss,weakness, recent illness HEENT- denies eye drainage, change in vision, nasal discharge, CVS- denies chest pain,+ palpitations RESP- denies SOB, cough, wheeze ABD- denies N/V, change in stools, abd pain GU- denies dysuria, hematuria, dribbling, incontinence MSK-+ joint pain, muscle aches, injury Neuro- denies headache, dizziness, syncope, seizure activity       Objective:    BP 128/74 mmHg  Pulse 68  Temp(Src) 98 F (36.7 C) (Oral)  Resp 16  Ht 5\' 11"  (1.803 m)  Wt 224 lb (101.606 kg)  BMI  31.26 kg/m2 GEN- NAD, alert and oriented x3 HEENT- PERRL, EOMI, non injected sclera, pink conjunctiva, MMM, oropharynx clear Neck- Supple, no thyromegaly, no bruit CVS- RRR, no murmur RESP-CTAB EXT- No edema MSK- left knee- incision d/c/i, mild diffuse swelling, ambulates with cane, no erythema or heat to knee Pulses- Radial, DP- 2+  EKG-NSR, no ST changes      Assessment & Plan:      Problem List Items Addressed This Visit    None    Visit Diagnoses    PAF (paroxysmal atrial fibrillation)    -  Primary    Concern for Afib or Jimmy arythmia as he is having both bradycardia and tachycardia, would beneift from Holter monitor and cardiology, + risk factors for CAD, He is on ASA 325mg  BID per ortho for prophhylaxis    Relevant Orders    Ambulatory referral to Cardiology    EKG 12-Lead (Completed)       Note: This dictation was prepared with Dragon dictation along with smaller phrase technology. Any transcriptional errors that result from this process are unintentional.

## 2014-11-06 NOTE — Assessment & Plan Note (Signed)
Last A1C 6.3% LDL WAS ALSO at goal On ACEI Will have repeat labs in 1 week Will also need TSH due to PAF

## 2014-11-06 NOTE — Assessment & Plan Note (Signed)
Plan for fasting labs in 1 week, based on 12 week cycle \\continue  pravastatin

## 2014-11-07 DIAGNOSIS — Z5181 Encounter for therapeutic drug level monitoring: Secondary | ICD-10-CM | POA: Diagnosis not present

## 2014-11-07 DIAGNOSIS — E119 Type 2 diabetes mellitus without complications: Secondary | ICD-10-CM | POA: Diagnosis not present

## 2014-11-07 DIAGNOSIS — F419 Anxiety disorder, unspecified: Secondary | ICD-10-CM | POA: Diagnosis not present

## 2014-11-07 DIAGNOSIS — E785 Hyperlipidemia, unspecified: Secondary | ICD-10-CM | POA: Diagnosis not present

## 2014-11-07 DIAGNOSIS — Z452 Encounter for adjustment and management of vascular access device: Secondary | ICD-10-CM | POA: Diagnosis not present

## 2014-11-07 DIAGNOSIS — T8454XD Infection and inflammatory reaction due to internal left knee prosthesis, subsequent encounter: Secondary | ICD-10-CM | POA: Diagnosis not present

## 2014-11-07 DIAGNOSIS — Z87891 Personal history of nicotine dependence: Secondary | ICD-10-CM | POA: Diagnosis not present

## 2014-11-07 NOTE — Telephone Encounter (Signed)
Refill appropriate and filled per protocol. 

## 2014-11-08 ENCOUNTER — Encounter: Payer: Self-pay | Admitting: Cardiovascular Disease

## 2014-11-08 ENCOUNTER — Ambulatory Visit (HOSPITAL_COMMUNITY): Payer: Medicare HMO | Attending: Cardiovascular Disease | Admitting: Radiology

## 2014-11-08 ENCOUNTER — Ambulatory Visit (INDEPENDENT_AMBULATORY_CARE_PROVIDER_SITE_OTHER): Payer: Commercial Managed Care - HMO | Admitting: Cardiovascular Disease

## 2014-11-08 VITALS — BP 148/88 | HR 103 | Ht 71.5 in

## 2014-11-08 DIAGNOSIS — E669 Obesity, unspecified: Secondary | ICD-10-CM | POA: Insufficient documentation

## 2014-11-08 DIAGNOSIS — E785 Hyperlipidemia, unspecified: Secondary | ICD-10-CM | POA: Diagnosis not present

## 2014-11-08 DIAGNOSIS — Z87891 Personal history of nicotine dependence: Secondary | ICD-10-CM | POA: Insufficient documentation

## 2014-11-08 DIAGNOSIS — E119 Type 2 diabetes mellitus without complications: Secondary | ICD-10-CM | POA: Diagnosis not present

## 2014-11-08 DIAGNOSIS — T8454XD Infection and inflammatory reaction due to internal left knee prosthesis, subsequent encounter: Secondary | ICD-10-CM | POA: Diagnosis not present

## 2014-11-08 DIAGNOSIS — I480 Paroxysmal atrial fibrillation: Secondary | ICD-10-CM

## 2014-11-08 DIAGNOSIS — F419 Anxiety disorder, unspecified: Secondary | ICD-10-CM | POA: Diagnosis not present

## 2014-11-08 DIAGNOSIS — I4891 Unspecified atrial fibrillation: Secondary | ICD-10-CM

## 2014-11-08 DIAGNOSIS — I10 Essential (primary) hypertension: Secondary | ICD-10-CM | POA: Insufficient documentation

## 2014-11-08 DIAGNOSIS — Z5181 Encounter for therapeutic drug level monitoring: Secondary | ICD-10-CM | POA: Diagnosis not present

## 2014-11-08 DIAGNOSIS — Z452 Encounter for adjustment and management of vascular access device: Secondary | ICD-10-CM | POA: Diagnosis not present

## 2014-11-08 MED ORDER — METOPROLOL TARTRATE 25 MG PO TABS: 25.0000 mg | ORAL_TABLET | Freq: Two times a day (BID) | ORAL | Status: DC

## 2014-11-08 MED ORDER — RIVAROXABAN 20 MG PO TABS: 20.0000 mg | ORAL_TABLET | Freq: Every day | ORAL | Status: DC

## 2014-11-08 MED ORDER — RIVAROXABAN 20 MG PO TABS
20.0000 mg | ORAL_TABLET | Freq: Every day | ORAL | Status: DC
Start: 2014-11-08 — End: 2014-11-08

## 2014-11-08 NOTE — Patient Instructions (Addendum)
Check the price of the following anticoagulants:  Pradaxa 150 mg twice a day Xarelto 20 mg a day Eliquis 5 mg twice a day Savaysa 60 mg a day.  Your physician has recommended you make the following change in your medication:  START Metoprolol 25 mg twice daily START Xarelto 20 mg once daily  Your physician recommends that you return for a follow-up appointment on  Thursday March 3 at 10:15 am  Your physician recommends that you return for lab work in: same day as your office visit with Dr. Acie Fredrickson - CBC and BMET

## 2014-11-08 NOTE — Progress Notes (Signed)
Patient ID: Jimmy Bradshaw, male   DOB: Aug 01, 1946, 69 y.o.   MRN: [id number]    Date:  11/08/2014   ID:  Jimmy Bradshaw, DOB 1946/06/13, MRN [id number]  PCP:  Vic Blackbird, MD  Primary Cardiologist:  New(Riely Oetken)  Chief Complaint  Patient presents with  . Atrial Fibrillation   Problem list: 1. Paroxysmal atrial fibrillation  2. Hypertension 3. Hyperlipidemia 4. Diabetes mellitus 5. BPH - s/p TURP 6. 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 male history of hypertension, hyperlipidemia, type 2 diabetes mellitus, anxiety, BPH. On 09/30/2014 he underwent TURP procedure and then a week later underwent irrigation and debridement of the left knee.  PICC line was placed and he was started on antibiotics for 4 weeks.  One of these surgeries patient supposedly went into atrial fibrillation with a rapid ventricular response. There are no EKGs or telemetry strips which confirmed this nor is there any documentation in the notes; either in the discharge summaries or with anesthesia.   The patient currently denies nausea, vomiting, fever, chest pain, shortness of breath, orthopnea, dizziness, PND, cough, congestion, abdominal pain, hematochezia, melena, lower extremity edema, claudication.   Feb. 19, 2016   He was getting his echo today adn went in to rapid afib.  Still asymptomatic.  Was added on to my schedule.   Wt Readings from Last 3 Encounters:  11/06/14 223 lb 3.2 oz (101.243 kg)  11/06/14 224 lb (101.606 kg)  10/08/14 239 lb (108.41 kg)     Past Medical History  Diagnosis Date  . Hyperlipidemia   . Anxiety   . H/O: gout   . History of colon polyps   . BPH (benign prostatic hypertrophy)   . Bladder neck obstruction   . Type 2 diabetes mellitus   . Frequency of urination   . Urgency of urination   . Nocturia   . Arthritis     HANDS,  SHOULDERS  . Wears hearing aid    BILATERAL    Current Outpatient Prescriptions  Medication Sig Dispense Refill  . buPROPion (WELLBUTRIN XL) 300 MG 24 hr tablet TAKE 1 TABLET EVERY MORNING 90 tablet 0  . docusate sodium 100 MG CAPS Take 100 mg by mouth 2 (two) times daily. 10 capsule 0  . ferrous sulfate 325 (65 FE) MG tablet Take 1 tablet (325 mg total) by mouth 3 (three) times daily after meals.  3  . lisinopril (PRINIVIL,ZESTRIL) 2.5 MG tablet Take 2.5 mg by mouth daily with breakfast.    . metFORMIN (GLUCOPHAGE-XR) 500 MG 24 hr tablet TAKE 1 TABLET EVERY DAY WITH BREAKFAST 90 tablet 1  . oxyCODONE (OXY IR/ROXICODONE) 5 MG immediate release tablet Take 1-3 tablets (5-15 mg total) by mouth every 4 (four) hours as needed for moderate pain or severe pain. 120 tablet 0  . polyethylene glycol (MIRALAX / GLYCOLAX) packet Take 17 g by mouth 2 (two) times daily. 14 each 0  . pravastatin (PRAVACHOL) 40 MG tablet TAKE 2 TABLETS  DAILY. 180 tablet 1  . silodosin (RAPAFLO) 8 MG CAPS capsule Take 8 mg by mouth daily with breakfast.    . sodium chloride 0.9 % injection 10-40 mLs by Intracatheter route as needed (flush). 5 mL   . tiZANidine (ZANAFLEX) 4 MG tablet Take 1 tablet (4 mg total) by mouth every 6 (six) hours as needed for muscle spasms. 30 tablet 0  No current facility-administered medications for this visit.    Allergies:    Allergies  Allergen Reactions  . Penicillins Rash    Social History:  The patient  reports that he quit smoking about 15 years ago. His smoking use included Cigarettes. He quit after 4 years of use. He has never used smokeless tobacco. He reports that he drinks alcohol. He reports that he does not use illicit drugs.   Family history:   Family History  Problem Relation Age of Onset  . Adopted: Yes    ROS:  Please see the history of present illness.  All other systems reviewed and negative.   PHYSICAL EXAM: VS:  BP 148/88 mmHg  Pulse 103  Ht 5' 11.5" (1.816 m)  Wt  obese, well developed, in  no acute distress HEENT: Pupils are equal round react to light accommodation extraocular movements are intact.  Neck: no JVDNo cervical lymphadenopathy. Cardiac: Regular rate and rhythm without murmurs rubs or gallops.  His HR later was very irregular  Lungs:  clear to auscultation bilaterally, no wheezing, rhonchi or rales Abd: soft, nontender, positive bowel sounds all quadrants, no hepatosplenomegaly Ext: no lower extremity edema.  2+ radial and dorsalis pedis pulses. Skin: warm and dry Neuro:  Grossly normal  EKG:   NSR with transition to atrial flutter   ASSESSMENT AND PLAN:  1. Paroxysmal Atrial fibrillation:  He has now been found to have paroxysmal atrial fibrillation. He went into atrial fibrillation during his echocardiogram. We had suspected that he had atrial fibrillation and now have improved.  We'll start him on Xarelto 20 mg a day.  .  Will also start him on Metoprolol 25 mg BID.   I have personally reviewed his echo.  He has normal LV function.   I will see him on March 3 for OV , CBC and BMP.   2. Essential hypertension: Patient's blood pressure is well-controlled. We'll be starting him on metoprolol for his atrial fibrillation.   3. Hyperlipidemia: Continue to follow.  4.  Diabetes mellitus-followed by his general medical doctor.

## 2014-11-08 NOTE — Progress Notes (Signed)
Echocardiogram performed.  

## 2014-11-11 ENCOUNTER — Telehealth: Payer: Self-pay | Admitting: *Deleted

## 2014-11-11 DIAGNOSIS — E119 Type 2 diabetes mellitus without complications: Secondary | ICD-10-CM | POA: Diagnosis not present

## 2014-11-11 DIAGNOSIS — E785 Hyperlipidemia, unspecified: Secondary | ICD-10-CM | POA: Diagnosis not present

## 2014-11-11 DIAGNOSIS — Z5181 Encounter for therapeutic drug level monitoring: Secondary | ICD-10-CM | POA: Diagnosis not present

## 2014-11-11 DIAGNOSIS — Z452 Encounter for adjustment and management of vascular access device: Secondary | ICD-10-CM | POA: Diagnosis not present

## 2014-11-11 DIAGNOSIS — Z87891 Personal history of nicotine dependence: Secondary | ICD-10-CM | POA: Diagnosis not present

## 2014-11-11 DIAGNOSIS — T8454XD Infection and inflammatory reaction due to internal left knee prosthesis, subsequent encounter: Secondary | ICD-10-CM | POA: Diagnosis not present

## 2014-11-11 DIAGNOSIS — F419 Anxiety disorder, unspecified: Secondary | ICD-10-CM | POA: Diagnosis not present

## 2014-11-11 NOTE — Telephone Encounter (Signed)
Received monitor tracings 11/08/14 4:23:27 PM(CT):  Atrial Fibrillation-  Dr Acie Fredrickson reviewed and signed the tracings, returned to monitor room.

## 2014-11-12 ENCOUNTER — Telehealth: Payer: Self-pay | Admitting: Cardiovascular Disease

## 2014-11-12 DIAGNOSIS — D239 Other benign neoplasm of skin, unspecified: Secondary | ICD-10-CM | POA: Diagnosis not present

## 2014-11-12 DIAGNOSIS — E119 Type 2 diabetes mellitus without complications: Secondary | ICD-10-CM | POA: Diagnosis not present

## 2014-11-12 NOTE — Telephone Encounter (Signed)
Per Dr Acie Fredrickson the pt should stop Xarelto at this time and touch base with Dr Cy Blamer office.  The pt is at an increased risk of stroke while off of Xarelto.  Per Dr Acie Fredrickson we can reassess starting anticoagulant at upcoming appointment on 11/21/14 unless cleared by Dr Risa Grill prior to appointment. Pt aware of instructions and is awaiting a call from Dr Cy Blamer office.  The pt said he will contact our office with Dr Cy Blamer recommendations.

## 2014-11-12 NOTE — Telephone Encounter (Signed)
New message     Pt is on xarelto.  He has blood in his urine. Please advise

## 2014-11-12 NOTE — Telephone Encounter (Signed)
I spoke with the pt and he started Xarelto on Saturday.  The pt states he had a slight discoloration (small amount of blood, urine was orange) in his urine last week before starting Xarelto. Today the pt has developed bright red blood with urination.  The pt has contacted Dr Cy Blamer office today but has not received a call at this time.  I made the pt aware that I will discuss his symptoms with Dr Acie Fredrickson.

## 2014-11-13 DIAGNOSIS — F419 Anxiety disorder, unspecified: Secondary | ICD-10-CM | POA: Diagnosis not present

## 2014-11-13 DIAGNOSIS — Z5181 Encounter for therapeutic drug level monitoring: Secondary | ICD-10-CM | POA: Diagnosis not present

## 2014-11-13 DIAGNOSIS — Z87891 Personal history of nicotine dependence: Secondary | ICD-10-CM | POA: Diagnosis not present

## 2014-11-13 DIAGNOSIS — E785 Hyperlipidemia, unspecified: Secondary | ICD-10-CM | POA: Diagnosis not present

## 2014-11-13 DIAGNOSIS — E119 Type 2 diabetes mellitus without complications: Secondary | ICD-10-CM | POA: Diagnosis not present

## 2014-11-13 DIAGNOSIS — Z452 Encounter for adjustment and management of vascular access device: Secondary | ICD-10-CM | POA: Diagnosis not present

## 2014-11-13 DIAGNOSIS — T8454XD Infection and inflammatory reaction due to internal left knee prosthesis, subsequent encounter: Secondary | ICD-10-CM | POA: Diagnosis not present

## 2014-11-15 DIAGNOSIS — Z452 Encounter for adjustment and management of vascular access device: Secondary | ICD-10-CM | POA: Diagnosis not present

## 2014-11-15 DIAGNOSIS — Z5181 Encounter for therapeutic drug level monitoring: Secondary | ICD-10-CM | POA: Diagnosis not present

## 2014-11-15 DIAGNOSIS — T8454XD Infection and inflammatory reaction due to internal left knee prosthesis, subsequent encounter: Secondary | ICD-10-CM | POA: Diagnosis not present

## 2014-11-15 DIAGNOSIS — Z87891 Personal history of nicotine dependence: Secondary | ICD-10-CM | POA: Diagnosis not present

## 2014-11-15 DIAGNOSIS — F419 Anxiety disorder, unspecified: Secondary | ICD-10-CM | POA: Diagnosis not present

## 2014-11-15 DIAGNOSIS — E119 Type 2 diabetes mellitus without complications: Secondary | ICD-10-CM | POA: Diagnosis not present

## 2014-11-15 DIAGNOSIS — E785 Hyperlipidemia, unspecified: Secondary | ICD-10-CM | POA: Diagnosis not present

## 2014-11-15 NOTE — Telephone Encounter (Signed)
Spoke with patient who states Dr. Risa Grill advised he does not think patient has healed from prostate surgery and that this is the source of the bleeding.  He advised patient to follow cardiologists' recommendation for restarting Xarelto.  Patient states he currently does not note any blood in urine or any discoloration.  Patient has appointment at 3/3 with Dr. Acie Fredrickson at which time they will discuss resuming Xarelto.  Patient verbalized understanding and agreement.

## 2014-11-20 ENCOUNTER — Encounter: Payer: Self-pay | Admitting: Cardiovascular Disease

## 2014-11-21 ENCOUNTER — Encounter: Payer: Self-pay | Admitting: Cardiovascular Disease

## 2014-11-21 ENCOUNTER — Ambulatory Visit (INDEPENDENT_AMBULATORY_CARE_PROVIDER_SITE_OTHER): Payer: Commercial Managed Care - HMO | Admitting: Cardiovascular Disease

## 2014-11-21 VITALS — BP 140/82 | HR 62 | Ht 71.5 in | Wt 224.0 lb

## 2014-11-21 DIAGNOSIS — I10 Essential (primary) hypertension: Secondary | ICD-10-CM

## 2014-11-21 DIAGNOSIS — I480 Paroxysmal atrial fibrillation: Secondary | ICD-10-CM | POA: Diagnosis not present

## 2014-11-21 MED ORDER — RIVAROXABAN 20 MG PO TABS: 20.0000 mg | ORAL_TABLET | Freq: Every day | ORAL | Status: DC

## 2014-11-21 NOTE — Progress Notes (Signed)
Patient ID: Jimmy Bradshaw, male   DOB: 09/04/1946, 69 y.o.   MRN: [id number]    Date:  11/21/2014   ID:  Jimmy Bradshaw, DOB October 27, 1945, MRN [id number]  PCP:  Vic Blackbird, MD  Primary Cardiologist:  New(Sheyann Sulton)  Chief Complaint  Patient presents with  . Atrial Fibrillation   Problem list: 1. Paroxysmal atrial fibrillation  - CHADS2VASC score of 3   ( age, HTN, DM)  2. Hypertension 3. Hyperlipidemia 4. Diabetes mellitus 5. BPH - s/p TURP 6. S/p left TKA  - with supsequent infection of his prosthesis requiring re-do surgery .   History of Present Illness: Jimmy Bradshaw is a 69 y.o. male history of hypertension, hyperlipidemia, type 2 diabetes mellitus, anxiety, BPH. On 09/30/2014 he underwent TURP procedure and then a week later underwent irrigation and debridement of the left knee.  PICC line was placed and he was started on antibiotics for 4 weeks.  One of these surgeries patient supposedly went into atrial fibrillation with a rapid ventricular response. There are no EKGs or telemetry strips which confirmed this nor is there any documentation in the notes; either in the discharge summaries or with anesthesia.   The patient currently denies nausea, vomiting, fever, chest pain, shortness of breath, orthopnea, dizziness, PND, cough, congestion, abdominal pain, hematochezia, melena, lower extremity edema, claudication.   Feb. 19, 2016   He was getting his echo today and went in to rapid afib.  Still asymptomatic.  Was added on to my schedule.   November 21, 2014:  Jimmy Bradshaw is seen back today for follow up of his paroxysmal atrial fib.    We started him on Xarelto . Unfortunately, he had some bleeding in his urine .  He had just had recent prostate surgery ( [name]) .   He held his Xarelto for the next week.  He is doing better.   Wt Readings from Last 3 Encounters:  11/21/14 224 lb (101.606 kg)  11/06/14 223 lb 3.2 oz (101.243 kg)  11/06/14 224 lb (101.606 kg)     Past Medical  History  Diagnosis Date  . Hyperlipidemia   . Anxiety   . H/O: gout   . History of colon polyps   . BPH (benign prostatic hypertrophy)   . Bladder neck obstruction   . Type 2 diabetes mellitus   . Frequency of urination   . Urgency of urination   . Nocturia   . Arthritis     HANDS,  SHOULDERS  . Wears hearing aid     BILATERAL    Current Outpatient Prescriptions  Medication Sig Dispense Refill  . buPROPion (WELLBUTRIN XL) 300 MG 24 hr tablet TAKE 1 TABLET EVERY MORNING 90 tablet 0  . lisinopril (PRINIVIL,ZESTRIL) 2.5 MG tablet Take 2.5 mg by mouth daily with breakfast.    . metFORMIN (GLUCOPHAGE-XR) 500 MG 24 hr tablet TAKE 1 TABLET EVERY DAY WITH BREAKFAST 90 tablet 1  . metoprolol tartrate (LOPRESSOR) 25 MG tablet Take 1 tablet (25 mg total) by mouth 2 (two) times daily. 60 tablet 11  . pravastatin (PRAVACHOL) 40 MG tablet TAKE 2 TABLETS  DAILY. 180 tablet 1   No current facility-administered medications for this visit.    Allergies:    Allergies  Allergen Reactions  . Penicillins Rash    Social History:  The patient  reports that he quit smoking about 15 years ago. His smoking use included Cigarettes. He quit after 4 years of use. He has never used smokeless tobacco.  He reports that he drinks alcohol. He reports that he does not use illicit drugs.   Family history:   Family History  Problem Relation Age of Onset  . Adopted: Yes  . Colon cancer Father     ROS:  Please see the history of present illness.  All other systems reviewed and negative.   PHYSICAL EXAM: VS:  BP 140/82 mmHg  Pulse 62  Ht 5' 11.5" (1.816 m)  Wt 224 lb (101.606 kg)  BMI 30.81 kg/m2 obese, well developed, in no acute distress HEENT: Pupils are equal round react to light accommodation extraocular movements are intact.  Neck: no JVDNo cervical lymphadenopathy. Cardiac: Regular rate and rhythm without murmurs rubs or gallops.  His HR later was very irregular  Lungs:  clear to auscultation  bilaterally, no wheezing, rhonchi or rales Abd: soft, nontender, positive bowel sounds all quadrants, no hepatosplenomegaly Ext: no lower extremity edema.  2+ radial and dorsalis pedis pulses. Skin: warm and dry Neuro:  Grossly normal  EKG:   NSR with transition to atrial flutter   ASSESSMENT AND PLAN:  1. Paroxysmal Atrial fibrillation:  He has now been found to have paroxysmal atrial fibrillation. He went into atrial fibrillation during his echocardiogram. I saw him several weeks ago and started him on Xarelto and metoprolol. Unfortunately, he had significant blood in his urine-presumably from his recent prostate surgery. He held the Xarelto in his urine has cleared up. He has not noticed any further bleeding.  We'll re- start him on Xarelto 20 mg a day.   He'll call us back if he develops any further bleeding. I'll see him in 3 months for follow-up visit.    2. Essential hypertension: Patient's blood pressure is well-controlled. We'll be starting him on metoprolol for his atrial fibrillation.   3. Hyperlipidemia: Continue to follow.  4.  Diabetes mellitus-followed by his general medical doctor.   Thayer Headings, [name], MD, [facility] 11/21/2014, 11:35 AM [address],  [place] Pager [phone]

## 2014-11-21 NOTE — Patient Instructions (Addendum)
Your physician has recommended you make the following change in your medication:  RESUME Xarelto 20 mg once daily   Your physician recommends that you schedule a follow-up appointment in: 3 months with Dr. Acie Fredrickson

## 2014-11-22 ENCOUNTER — Other Ambulatory Visit: Payer: Self-pay | Admitting: Family Medicine

## 2014-11-22 ENCOUNTER — Telehealth: Payer: Self-pay | Admitting: Cardiovascular Disease

## 2014-11-22 MED ORDER — ASPIRIN EC 81 MG PO TBEC: 81.0000 mg | DELAYED_RELEASE_TABLET | Freq: Every day | ORAL | Status: DC

## 2014-11-22 NOTE — Telephone Encounter (Signed)
Left message for patient to call back  

## 2014-11-22 NOTE — Telephone Encounter (Signed)
Refill appropriate and filled per protocol. 

## 2014-11-22 NOTE — Telephone Encounter (Signed)
Spoke with patient who states his urine is pink this morning; states he only took 1 Xarelto.  I advised patient that per Dr. Acie Fredrickson, who is in the office today, he should discontinue Xarelto and call  Dr. Risa Grill because pt should not continue to have bleeding from surgery done 1/11.  Patient asked if he can continue his ASA 81 mg and I advised that he can continue unless he continues to see blood in urine after stopping Xarelto.  I advised patient to let us know the outcome of his conversation and/or office visit with Dr. Risa Grill.  Patient verbalized understanding and agreement with plan.

## 2014-11-22 NOTE — Telephone Encounter (Signed)
New Msg       Pt c/o medication issue:  1. Name of Medication: Xarelto  2. How are you currently taking this medication (dosage and times per day)? 20 mg once daily  3. Are you having a reaction (difficulty breathing--STAT)? Yes, blood in urine beginning last night. Traces this morning.   4. What is your medication issue? Should he continue medication   Please return pt call.

## 2014-11-26 ENCOUNTER — Telehealth: Payer: Self-pay | Admitting: Cardiovascular Disease

## 2014-11-26 MED ORDER — APIXABAN 5 MG PO TABS: 5.0000 mg | ORAL_TABLET | Freq: Two times a day (BID) | ORAL | Status: DC

## 2014-11-26 NOTE — Addendum Note (Signed)
Addended by: Emmaline Life on: 11/26/2014 02:07 PM   Modules accepted: Orders, Medications

## 2014-11-26 NOTE — Telephone Encounter (Signed)
Jimmy Bradshaw has had recurrent episodes of hematuria after trying Xarelto. I discussed the case with Dr. Risa Grill. At this point we'll have him hold his anticoagulant for one more week and then we will start him on Eliquis 5 mg twice a day.  If he has more bleeding then we will probably hold for the Eliquis for 4 weeks and Dr. Risa Grill  May reassess his urethra / bladder .  Thayer Headings, [name], MD, [facility] 11/26/2014, 10:13 AM [address],  [place] Pager [phone]

## 2014-11-26 NOTE — Telephone Encounter (Signed)
Discontinued Xarelto in patient's record and ordered Eliquis 5 mg BID.  I spoke with patient to make certain he is aware of the plan of care; patient verbalized understanding and agreement and will call back with questions and concerns.

## 2014-12-12 DIAGNOSIS — Z96652 Presence of left artificial knee joint: Secondary | ICD-10-CM | POA: Diagnosis not present

## 2014-12-12 DIAGNOSIS — Z471 Aftercare following joint replacement surgery: Secondary | ICD-10-CM | POA: Diagnosis not present

## 2014-12-17 ENCOUNTER — Other Ambulatory Visit: Payer: Self-pay | Admitting: Family Medicine

## 2014-12-18 ENCOUNTER — Telehealth: Payer: Self-pay | Admitting: *Deleted

## 2014-12-18 NOTE — Telephone Encounter (Signed)
Pt came into office yesterday to inquire about his denial of payment from [facility] when he went to his cardiologist Dr.Nahser,MD, I called [employer] liason Laurita Quint in reference to this and she stated that pt is not responsible for the bill he is only responsible for copay and deductible and any thing over that is the specialist and PCP responsibility Barnetta Chapel stated that Cardiology should be appealing the case. I called over to [facility] and spoke with Suanne Marker in billing and stated that they are appealing the case and that if pt does receive a bill then he needs to contact her at [phone].  Called and left message to return my call to patient.

## 2014-12-18 NOTE — Telephone Encounter (Signed)
Pt aware of message

## 2014-12-23 ENCOUNTER — Ambulatory Visit (INDEPENDENT_AMBULATORY_CARE_PROVIDER_SITE_OTHER): Payer: Commercial Managed Care - HMO | Admitting: Family Medicine

## 2014-12-23 ENCOUNTER — Encounter: Payer: Self-pay | Admitting: Family Medicine

## 2014-12-23 VITALS — BP 148/76 | HR 78 | Temp 97.5°F | Resp 18 | Ht 71.5 in | Wt 225.0 lb

## 2014-12-23 DIAGNOSIS — I10 Essential (primary) hypertension: Secondary | ICD-10-CM

## 2014-12-23 DIAGNOSIS — E669 Obesity, unspecified: Secondary | ICD-10-CM

## 2014-12-23 DIAGNOSIS — R5382 Chronic fatigue, unspecified: Secondary | ICD-10-CM

## 2014-12-23 DIAGNOSIS — E785 Hyperlipidemia, unspecified: Secondary | ICD-10-CM | POA: Diagnosis not present

## 2014-12-23 DIAGNOSIS — E119 Type 2 diabetes mellitus without complications: Secondary | ICD-10-CM | POA: Diagnosis not present

## 2014-12-23 DIAGNOSIS — I480 Paroxysmal atrial fibrillation: Secondary | ICD-10-CM

## 2014-12-23 DIAGNOSIS — I4891 Unspecified atrial fibrillation: Secondary | ICD-10-CM | POA: Diagnosis not present

## 2014-12-23 DIAGNOSIS — Z79899 Other long term (current) drug therapy: Secondary | ICD-10-CM | POA: Diagnosis not present

## 2014-12-23 LAB — COMPREHENSIVE METABOLIC PANEL
ALK PHOS: 52 U/L (ref 39–117)
ALT: 11 U/L (ref 0–53)
AST: 15 U/L (ref 0–37)
Albumin: 4.2 g/dL (ref 3.5–5.2)
BILIRUBIN TOTAL: 0.7 mg/dL (ref 0.2–1.2)
BUN: 21 mg/dL (ref 6–23)
CALCIUM: 9.4 mg/dL (ref 8.4–10.5)
CHLORIDE: 106 meq/L (ref 96–112)
CO2: 28 meq/L (ref 19–32)
Creat: 1.03 mg/dL (ref 0.50–1.35)
GLUCOSE: 112 mg/dL — AB (ref 70–99)
POTASSIUM: 4.9 meq/L (ref 3.5–5.3)
SODIUM: 141 meq/L (ref 135–145)
Total Protein: 6.6 g/dL (ref 6.0–8.3)

## 2014-12-23 LAB — CBC WITH DIFFERENTIAL/PLATELET
BASOS ABS: 0 10*3/uL (ref 0.0–0.1)
Basophils Relative: 0 % (ref 0–1)
EOS ABS: 0.2 10*3/uL (ref 0.0–0.7)
Eosinophils Relative: 2 % (ref 0–5)
HCT: 42.7 % (ref 39.0–52.0)
Hemoglobin: 14 g/dL (ref 13.0–17.0)
Lymphocytes Relative: 27 % (ref 12–46)
Lymphs Abs: 2.1 10*3/uL (ref 0.7–4.0)
MCH: 29.9 pg (ref 26.0–34.0)
MCHC: 32.8 g/dL (ref 30.0–36.0)
MCV: 91 fL (ref 78.0–100.0)
MPV: 11.3 fL (ref 8.6–12.4)
Monocytes Absolute: 0.5 10*3/uL (ref 0.1–1.0)
Monocytes Relative: 6 % (ref 3–12)
Neutro Abs: 5.1 10*3/uL (ref 1.7–7.7)
Neutrophils Relative %: 65 % (ref 43–77)
PLATELETS: 222 10*3/uL (ref 150–400)
RBC: 4.69 MIL/uL (ref 4.22–5.81)
RDW: 14.3 % (ref 11.5–15.5)
WBC: 7.9 10*3/uL (ref 4.0–10.5)

## 2014-12-23 LAB — HEMOGLOBIN A1C
HEMOGLOBIN A1C: 5.9 % — AB (ref <5.7)
Mean Plasma Glucose: 123 mg/dL — ABNORMAL HIGH (ref <117)

## 2014-12-23 LAB — LIPID PANEL
Cholesterol: 135 mg/dL (ref 0–200)
HDL: 29 mg/dL — ABNORMAL LOW (ref >=40)
LDL Cholesterol: 74 mg/dL (ref 0–99)
TRIGLYCERIDES: 161 mg/dL — AB (ref <150)
Total CHOL/HDL Ratio: 4.7 Ratio
VLDL: 32 mg/dL (ref 0–40)

## 2014-12-23 LAB — VITAMIN B12: Vitamin B-12: 713 pg/mL (ref 211–911)

## 2014-12-23 LAB — TSH: TSH: 1.064 u[IU]/mL (ref 0.350–4.500)

## 2014-12-23 NOTE — Assessment & Plan Note (Signed)
His diabetes has been well-controlled his last A1c was 6.3% I will recheck his fasting labs today

## 2014-12-23 NOTE — Assessment & Plan Note (Signed)
Continue to work on healthy weight. Regarding the fatigue this is most likely multifactorial he's had paroxysmal atrial fibrillation he has had complications from his knee surgery he has had recent prostate surgery. I want to check some basic labs including a thyroid function and hemoglobin since he did have some bleeding after his prostate surgery. He did have the fatigue before he was started on the beta blocker

## 2014-12-23 NOTE — Progress Notes (Signed)
Patient ID: Jimmy Bradshaw, male   DOB: Dec 12, 1945, 69 y.o.   MRN: [id number]   Subjective:    Patient ID: Jimmy Bradshaw, male    DOB: 1946-01-06, 69 y.o.   MRN: [id number]  Patient presents for 4 month F/U Pt here to f/u chronic medical problems, He was recently evaluated for paroxysmal atrial fibrillation. He was initially put on Xarelto however after a urology procedure he had some hematuria and therefore this was discontinued. He was given a prescription for Eliquis  but he has not started this yet because he wants to have a callus removed off of his foot. He states that he is fatigued all the time and this is been going on for the head any change in his medications. His blood sugars have been good they have been less than 120 fasting. He is taking all this medication as prescribed. He states he is sleeping fairly well and now he only gets up 2 or 3 times a night which has improved since his prostate surgery.  He is still on antibiotics Keflex 500mg  BID since his left knee surgery   Review Of Systems:  GEN- + fatigue, fever, weight loss,weakness, recent illness HEENT- denies eye drainage, change in vision, nasal discharge, CVS- denies chest pain, palpitations RESP- denies SOB, cough, wheeze ABD- denies N/V, change in stools, abd pain GU- denies dysuria, hematuria, dribbling, incontinence MSK- +joint pain, muscle aches, injury Neuro- denies headache, dizziness, syncope, seizure activity       Objective:    BP 148/76 mmHg  Pulse 78  Temp(Src) 97.5 F (36.4 C) (Oral)  Resp 18  Ht 5' 11.5" (1.816 m)  Wt 225 lb (102.059 kg)  BMI 30.95 kg/m2 GEN- NAD, alert and oriented x3 HEENT- PERRL, EOMI, non injected sclera, pink conjunctiva, MMM, oropharynx clear Neck- supple, no thyromegaly CVS- RRR, no murmur RESP-CTAB EXT- No edema Pulses- Radial, DP- 2+        Assessment & Plan:      Problem List Items Addressed This Visit    None    Visit Diagnoses    Chronic fatigue    -   Primary    Relevant Orders    Vitamin B12       Note: This dictation was prepared with Dragon dictation along with smaller phrase technology. Any transcriptional errors that result from this process are unintentional.

## 2014-12-23 NOTE — Assessment & Plan Note (Signed)
Blood pressure is a little elevated today he has a lot going on and seems a bit stressed today. I would have him check his blood pressure at home he is currently on metoprolol as well as lisinopril 2.5 mg daily. His heart rate looks good but I would recommend going up on the lisinopril to 5 mg to help his blood pressure needed

## 2014-12-23 NOTE — Patient Instructions (Addendum)
We will call with lab results Start the eliquis as we discussed Use tylenol as needed for knee pain or topical biofreeze  Check blood pressure once a day and record, we will call you 2 weeks with readings F/U 3 months

## 2014-12-27 DIAGNOSIS — B079 Viral wart, unspecified: Secondary | ICD-10-CM | POA: Diagnosis not present

## 2014-12-27 DIAGNOSIS — B070 Plantar wart: Secondary | ICD-10-CM | POA: Diagnosis not present

## 2015-01-07 ENCOUNTER — Ambulatory Visit (INDEPENDENT_AMBULATORY_CARE_PROVIDER_SITE_OTHER): Payer: Commercial Managed Care - HMO | Admitting: Family Medicine

## 2015-01-07 ENCOUNTER — Encounter: Payer: Self-pay | Admitting: Family Medicine

## 2015-01-07 VITALS — BP 130/78 | HR 80 | Temp 99.0°F | Resp 16 | Ht 71.5 in | Wt 222.0 lb

## 2015-01-07 DIAGNOSIS — R110 Nausea: Secondary | ICD-10-CM

## 2015-01-07 DIAGNOSIS — J069 Acute upper respiratory infection, unspecified: Secondary | ICD-10-CM | POA: Diagnosis not present

## 2015-01-07 DIAGNOSIS — J029 Acute pharyngitis, unspecified: Secondary | ICD-10-CM | POA: Diagnosis not present

## 2015-01-07 LAB — RAPID STREP SCREEN (MED CTR MEBANE ONLY): Streptococcus, Group A Screen (Direct): NEGATIVE

## 2015-01-07 MED ORDER — AZITHROMYCIN 250 MG PO TABS: ORAL_TABLET | ORAL | Status: DC

## 2015-01-07 NOTE — Patient Instructions (Signed)
Take the zpak,  Hold the keflex until this is finished Take the coricidan  Use nasal saline rinse Try the Restora pro-biotic F/U as previous

## 2015-01-07 NOTE — Progress Notes (Signed)
Patient ID: Jimmy Bradshaw, male   DOB: 1945-12-13, 69 y.o.   MRN: [id number]   Subjective:    Patient ID: Jimmy Bradshaw, male    DOB: 1946/09/13, 69 y.o.   MRN: [id number]  Patient presents for Illness  patient here with sore throat sinus congestion and drainage for the past 48 hours. His history of diabetes mellitus he is also recently status post surgery for plantar lesion. Positive subjective fever. No known sick contacts. He has not had any significant cough. He is not taking anything over-the-counter.  He does state that for the past couple weeks he gets a little nauseous when he eats he was worried that he may have stomach cancer. He has not had any pain no changes in his bowels no vomiting. He's also been on a daily antibiotic because of a joint infection after knee replacement a couple months ago. He still has 4 weeks left on this  He has not restarted his eliquis yet  Review Of Systems:  GEN- + fatigue, +fever, weight loss,weakness, recent illness HEENT- denies eye drainage, change in vision, +nasal discharge, CVS- denies chest pain, palpitations RESP- denies SOB, cough, wheeze ABD-+ N/V, change in stools, abd pain GU- denies dysuria, hematuria, dribbling, incontinence MSK- denies joint pain, muscle aches, injury Neuro- denies headache, dizziness, syncope, seizure activity       Objective:    BP 130/78 mmHg  Pulse 80  Temp(Src) 99 F (37.2 C) (Oral)  Resp 16  Ht 5' 11.5" (1.816 m)  Wt 222 lb (100.699 kg)  BMI 30.53 kg/m2 GEN- NAD, alert and oriented x3 HEENT- PERRL, EOMI, non injected sclera, pink conjunctiva, MMM, oropharynx injected, drainage noted, nares +rhinorrhea, enlarged turbninates, TTP left frontal sinus, TM clear bilat, nasally sounding Neck- Supple, shotty LAD CVS- RRR, no murmur RESP-CTAB ABD-NABS,soft,NT,ND EXT- No edema, surgery site -right sole d/c/i minimal bleeding Pulses- Radial, DP- 2+  Strep neg      Assessment & Plan:      Problem List  Items Addressed This Visit    None    Visit Diagnoses    Acute URI    -  Primary    I think he is high-risk for decompensation based on his comorbidities recent hospitalizations. I'm going to treat him with Z-Pak,Coridan, Nasal saline. Sore throat from post nasal drip, signs of sinusitis   Advised to start Eliquis as prescribed    Relevant Medications    azithromycin (ZITHROMAX) 250 MG tablet    Other Relevant Orders    Rapid Strep Screen (Completed)      Nausea - Nusea is likely multifactorial he is on chronic antibiotics he also takes metformin which can also cause nausea with certain meals. I'm going to give him a RESTORA samples, to see if this helps.  He will hold the keflex will on zpak, no change in stools no pain therefore will monitor He has been trying to intentially loose weight as well  Note: This dictation was prepared with Dragon dictation along with smaller phrase technology. Any transcriptional errors that result from this process are unintentional.

## 2015-01-08 ENCOUNTER — Telehealth: Payer: Self-pay | Admitting: Family Medicine

## 2015-01-08 NOTE — Telephone Encounter (Signed)
Contacted pt and he stated that he has had a Headache up all night with it and chest congestion wanted to know what he can take, advised pt can take OTC tylenol or aspirin, pt states that he has pain medication and asked if he could take that, I told hiim that would be fine and to take one every 8 hrs.

## 2015-01-08 NOTE — Telephone Encounter (Signed)
Patient calling to say he has had headache all night and does not know what he should do because he can take only certain things  [phone]

## 2015-01-14 ENCOUNTER — Telehealth: Payer: Self-pay | Admitting: *Deleted

## 2015-01-14 NOTE — Telephone Encounter (Signed)
-----   Message from Alycia Rossetti, MD sent at 01/14/2015  4:49 PM EDT ----- Regarding: RE: F/U pt Okay to take zyrtec ----- Message -----    From: Maureen Chatters, CMA    Sent: 01/14/2015   3:45 PM      To: Alycia Rossetti, MD Subject: RE: F/U pt                                     Called pt and states he has improved states it has made a 360. Also wanted to know if it is ok for him to take Zyrtec for his allergies d/t his heart condition ----- Message -----    From: Alycia Rossetti, MD    Sent: 01/13/2015   4:11 PM      To: Maureen Chatters, CMA Subject: FW: F/U pt                                     Call pt and see if he has improved from respiratory illness.  ----- Message -----    From: Alycia Rossetti, MD    Sent: 01/13/2015      To: Alycia Rossetti, MD Subject: F/U pt

## 2015-02-03 ENCOUNTER — Ambulatory Visit: Payer: Medicare HMO | Admitting: Cardiovascular Disease

## 2015-03-11 ENCOUNTER — Encounter: Payer: Self-pay | Admitting: Cardiovascular Disease

## 2015-03-11 ENCOUNTER — Ambulatory Visit (INDEPENDENT_AMBULATORY_CARE_PROVIDER_SITE_OTHER): Payer: Commercial Managed Care - HMO | Admitting: Cardiovascular Disease

## 2015-03-11 VITALS — BP 130/80 | HR 54 | Ht 71.5 in | Wt 227.6 lb

## 2015-03-11 DIAGNOSIS — E785 Hyperlipidemia, unspecified: Secondary | ICD-10-CM

## 2015-03-11 DIAGNOSIS — I10 Essential (primary) hypertension: Secondary | ICD-10-CM

## 2015-03-11 DIAGNOSIS — I480 Paroxysmal atrial fibrillation: Secondary | ICD-10-CM

## 2015-03-11 MED ORDER — APIXABAN 5 MG PO TABS
5.0000 mg | ORAL_TABLET | Freq: Two times a day (BID) | ORAL | Status: DC
Start: 2015-03-11 — End: 2015-12-29

## 2015-03-11 NOTE — Patient Instructions (Addendum)
Medication Instructions:  START Eliquis 5 mg twice daily - take 12 hours apart  Labwork: Your physician recommends that you return for lab work in: 1 month to check your CBC, BMET on Jimmy Bradshaw recommends that you return for lab work in: 6 months on the day of or a few days before your office visit with Dr. Acie Fredrickson.  You will need to FAST for this appointment - nothing to eat or drink after midnight the night before except water.   Testing/Procedures: None Ordered   Follow-Up: Your physician recommends that you schedule a follow-up appointment in: 1 month with [facility]  Your physician wants you to follow-up in: 6 months with Dr. Acie Fredrickson.  You will receive a reminder letter in the mail two months in advance. If you don't receive a letter, please call our office to schedule the follow-up appointment.

## 2015-03-11 NOTE — Progress Notes (Signed)
Patient ID: Jimmy Bradshaw, male   DOB: 1946-09-06, 69 y.o.   MRN: [id number]    Date:  03/11/2015   ID:  Jimmy Bradshaw, DOB 1945/10/28, MRN [id number]  PCP:  Vic Blackbird, MD  Primary Cardiologist:  New(Nahser)  Chief Complaint  Patient presents with  . Atrial Fibrillation   Problem list: 1. Paroxysmal atrial fibrillation  - CHADS2VASC score of 3  ( age, HTN, DM)  2. Hypertension 3. Hyperlipidemia 4. Diabetes mellitus 5. BPH - s/p TURP 6. S/p left TKA  - with supsequent infection of his prosthesis requiring re-do surgery .   History of Present Illness: Jimmy Bradshaw is a 69 y.o. male history of hypertension, hyperlipidemia, type 2 diabetes mellitus, anxiety, BPH. On 09/30/2014 he underwent TURP procedure and then a week later underwent irrigation and debridement of the left knee.  PICC line was placed and he was started on antibiotics for 4 weeks.  One of these surgeries patient supposedly went into atrial fibrillation with a rapid ventricular response. There are no EKGs or telemetry strips which confirmed this nor is there any documentation in the notes; either in the discharge summaries or with anesthesia.   The patient currently denies nausea, vomiting, fever, chest pain, shortness of breath, orthopnea, dizziness, PND, cough, congestion, abdominal pain, hematochezia, melena, lower extremity edema, claudication.   Feb. 19, 2016   He was getting his echo today and went in to rapid afib.  Still asymptomatic.  Was added on to my schedule.   November 21, 2014:  Jimmy Bradshaw is seen back today for follow up of his paroxysmal atrial fib.    We started him on Xarelto . Unfortunately, he had some bleeding in his urine .  He had just had recent prostate surgery ( [name]) .   He held his Xarelto for the next week.  He is doing better.   March 11, 2015: Jimmy Bradshaw has a hx of paroxysmal atrial fib. Has Eliquis but has not started it yet  His hematura has resolved.  Checks his pulse and has not had  recurrent atrial fib to his knowledge   Doing well.  Is having some leg pain.    Thinks it may be due to the pravachol  Still having knee problems .    Wt Readings from Last 3 Encounters:  03/11/15 103.239 kg (227 lb 9.6 oz)  01/07/15 100.699 kg (222 lb)  12/23/14 102.059 kg (225 lb)     Past Medical History  Diagnosis Date  . Hyperlipidemia   . Anxiety   . H/O: gout   . History of colon polyps   . BPH (benign prostatic hypertrophy)   . Bladder neck obstruction   . Type 2 diabetes mellitus   . Frequency of urination   . Urgency of urination   . Nocturia   . Arthritis     HANDS,  SHOULDERS  . Wears hearing aid     BILATERAL    Current Outpatient Prescriptions  Medication Sig Dispense Refill  . aspirin EC 81 MG tablet Take 1 tablet (81 mg total) by mouth daily.    Marland Kitchen buPROPion (WELLBUTRIN XL) 300 MG 24 hr tablet TAKE 1 TABLET EVERY MORNING 90 tablet 1  . lisinopril (PRINIVIL,ZESTRIL) 2.5 MG tablet TAKE 1 TABLET EVERY DAY 90 tablet 1  . metFORMIN (GLUCOPHAGE-XR) 500 MG 24 hr tablet TAKE 1 TABLET EVERY DAY WITH BREAKFAST 90 tablet 1  . metoprolol tartrate (LOPRESSOR) 25 MG tablet Take 1 tablet (25 mg total) by  mouth 2 (two) times daily. 60 tablet 11  . metoprolol tartrate (LOPRESSOR) 25 MG tablet Take 25 mg by mouth daily.    . pravastatin (PRAVACHOL) 40 MG tablet TAKE 2 TABLETS  DAILY. 180 tablet 0   No current facility-administered medications for this visit.    Allergies:    Allergies  Allergen Reactions  . Penicillins Rash    Social History:  The patient  reports that he quit smoking about 16 years ago. His smoking use included Cigarettes. He quit after 4 years of use. He has never used smokeless tobacco. He reports that he drinks alcohol. He reports that he does not use illicit drugs.   Family history:   Family History  Problem Relation Age of Onset  . Adopted: Yes  . Colon cancer Father     ROS:  Please see the history of present illness.  All other systems  reviewed and negative.   PHYSICAL EXAM: VS:  BP 130/80 mmHg  Pulse 54  Ht 5' 11.5" (1.816 m)  Wt 103.239 kg (227 lb 9.6 oz)  BMI 31.30 kg/m2  SpO2 95% obese, well developed, in no acute distress HEENT: Pupils are equal round react to light accommodation extraocular movements are intact.  Neck: no JVDNo cervical lymphadenopathy. Cardiac: Regular rate and rhythm without murmurs rubs or gallops.  His HR later was very irregular  Lungs:  clear to auscultation bilaterally, no wheezing, rhonchi or rales Abd: soft, nontender, positive bowel sounds all quadrants, no hepatosplenomegaly Ext: no lower extremity edema.  2+ radial and dorsalis pedis pulses. Skin: warm and dry Neuro:  Grossly normal  EKG:   NSR with transition to atrial flutter   ASSESSMENT AND PLAN:  1. Paroxysmal Atrial fibrillation:  CHADS 2VASC score of 3 ( Age, HTN, DM)    He has now been found to have paroxysmal atrial fibrillation. He went into atrial fibrillation during his echocardiogram. I saw him several weeks ago and started him on Xarelto and metoprolol. Unfortunately, he had significant blood in his urine-presumably from his recent prostate surgery. He held the Xarelto in his urine has cleared up. He has not noticed any further bleeding.  We'll start him on Eliquis 5 BID .   ( did not tolerate Xarelto for some reason.)   He'll call us back if he develops any further bleeding. I'll see him in 6  months for follow-up visit.    2. Essential hypertension: Patient's blood pressure is well-controlled. Continue current meds   3. Hyperlipidemia: Continue to follow.  Followed by primary.  He thinks that some of his leg pain may be due to the Pravachol. He will discuss this with his primary medical doctor.  4.  Diabetes mellitus-followed by his general medical doctor.   Thayer Headings, [name], MD, [facility] 03/11/2015, 12:14 PM [address],  [place] Pager [phone]

## 2015-03-25 ENCOUNTER — Ambulatory Visit (INDEPENDENT_AMBULATORY_CARE_PROVIDER_SITE_OTHER): Payer: Commercial Managed Care - HMO | Admitting: Family Medicine

## 2015-03-25 ENCOUNTER — Encounter: Payer: Self-pay | Admitting: Family Medicine

## 2015-03-25 VITALS — BP 128/72 | HR 74 | Temp 97.1°F | Resp 16 | Ht 71.5 in | Wt 227.0 lb

## 2015-03-25 DIAGNOSIS — E669 Obesity, unspecified: Secondary | ICD-10-CM

## 2015-03-25 DIAGNOSIS — I10 Essential (primary) hypertension: Secondary | ICD-10-CM

## 2015-03-25 DIAGNOSIS — E785 Hyperlipidemia, unspecified: Secondary | ICD-10-CM

## 2015-03-25 DIAGNOSIS — E119 Type 2 diabetes mellitus without complications: Secondary | ICD-10-CM | POA: Diagnosis not present

## 2015-03-25 LAB — HEMOGLOBIN A1C, FINGERSTICK: Hgb A1C (fingerstick): 6 % — ABNORMAL HIGH (ref <5.7)

## 2015-03-25 NOTE — Assessment & Plan Note (Signed)
Discussed dietary changes and watching his nutrition. His weight is up 5 pounds

## 2015-03-25 NOTE — Progress Notes (Signed)
Patient ID: Jimmy Bradshaw, male   DOB: [birth year], 69 y.o.   MRN: [id number]   Subjective:    Patient ID: Jimmy Bradshaw, male    DOB: [birth year], 69 y.o.   MRN: [id number]  Patient presents for 3 month F/U  is here follow-up chronic medical problems. He has no particular concerns today. States that he feels well in general. His blood sugars have been good he did not bring his meter with him today. He is taking his metformin. He did stop the pravastatin secondary to some leg cramps and muscle aches and this has improved his symptoms. Instead he has taken niacin and he has not had any problems with flushing or GI intolerance. He is taking all his other medications as prescribed. I did review his last cardiology note for his paroxysmal atrial fibrillation. He is taking his blood thinner without any problems. Note he did receive a note from his gastroenterologist stating he was due for a colonoscopy is concerned about having antibodies before having a colonoscopy done.    Review Of Systems:  GEN- denies fatigue, fever, weight loss,weakness, recent illness HEENT- denies eye drainage, change in vision, nasal discharge, CVS- denies chest pain, palpitations RESP- denies SOB, cough, wheeze ABD- denies N/V, change in stools, abd pain GU- denies dysuria, hematuria, dribbling, incontinence MSK- denies joint pain, muscle aches, injury Neuro- denies headache, dizziness, syncope, seizure activity       Objective:    BP 128/72 mmHg  Pulse 74  Temp(Src) 97.1 F (36.2 C) (Oral)  Resp 16  Ht 5' 11.5" (1.816 m)  Wt 227 lb (102.967 kg)  BMI 31.22 kg/m2 GEN- NAD, alert and oriented x3 HEENT- PERRL, EOMI, non injected sclera, pink conjunctiva, MMM, oropharynx clear CVS- RRR, no murmur RESP-CTAB ABD-NABS,soft,NT,ND EXT- No edema Pulses- Radial, DP- 2+        Assessment & Plan:      Problem List Items Addressed This Visit    Obesity (BMI 30.0-34.9) - Primary   Hyperlipidemia   HTN  (hypertension)   Relevant Orders   Hemoglobin A1C, fingerstick (Completed)   Diabetes mellitus without complication   Relevant Orders   HM DIABETES FOOT EXAM (Completed)   Hemoglobin A1C, fingerstick (Completed)      Note: This dictation was prepared with Dragon dictation along with smaller phrase technology. Any transcriptional errors that result from this process are unintentional.

## 2015-03-25 NOTE — Assessment & Plan Note (Signed)
Blood pressure is well controlled with current medication

## 2015-03-25 NOTE — Patient Instructions (Addendum)
I will discuss with orthopedics for your colonoscopy and needing antibiotics  F/U 4 months

## 2015-03-25 NOTE — Assessment & Plan Note (Addendum)
A1c in office 6% which is well controlled. He will continue the metformin at 500 mg once a day. He is unable to leave a urine sample today will need urine micro-at next visit.

## 2015-03-25 NOTE — Assessment & Plan Note (Signed)
He'll have fasting labs and for cardiology next month. He is nonfasting today. We will see how he does with the niacin

## 2015-04-10 ENCOUNTER — Other Ambulatory Visit (INDEPENDENT_AMBULATORY_CARE_PROVIDER_SITE_OTHER): Payer: Commercial Managed Care - HMO | Admitting: *Deleted

## 2015-04-10 ENCOUNTER — Other Ambulatory Visit: Payer: Self-pay | Admitting: Family Medicine

## 2015-04-10 DIAGNOSIS — I10 Essential (primary) hypertension: Secondary | ICD-10-CM

## 2015-04-10 DIAGNOSIS — I480 Paroxysmal atrial fibrillation: Secondary | ICD-10-CM | POA: Diagnosis not present

## 2015-04-10 DIAGNOSIS — E785 Hyperlipidemia, unspecified: Secondary | ICD-10-CM

## 2015-04-10 LAB — CBC WITH DIFFERENTIAL/PLATELET
Basophils Absolute: 0 10*3/uL (ref 0.0–0.1)
Basophils Relative: 0.3 % (ref 0.0–3.0)
EOS PCT: 2.1 % (ref 0.0–5.0)
Eosinophils Absolute: 0.2 10*3/uL (ref 0.0–0.7)
HCT: 41.3 % (ref 39.0–52.0)
Hemoglobin: 13.8 g/dL (ref 13.0–17.0)
LYMPHS ABS: 1.6 10*3/uL (ref 0.7–4.0)
LYMPHS PCT: 21.9 % (ref 12.0–46.0)
MCHC: 33.6 g/dL (ref 30.0–36.0)
MCV: 91.2 fl (ref 78.0–100.0)
MONOS PCT: 8.6 % (ref 3.0–12.0)
Monocytes Absolute: 0.6 10*3/uL (ref 0.1–1.0)
Neutro Abs: 5 10*3/uL (ref 1.4–7.7)
Neutrophils Relative %: 67.1 % (ref 43.0–77.0)
PLATELETS: 200 10*3/uL (ref 150.0–400.0)
RBC: 4.52 Mil/uL (ref 4.22–5.81)
RDW: 14.1 % (ref 11.5–15.5)
WBC: 7.4 10*3/uL (ref 4.0–10.5)

## 2015-04-10 LAB — BASIC METABOLIC PANEL
BUN: 21 mg/dL (ref 6–23)
CALCIUM: 9.4 mg/dL (ref 8.4–10.5)
CHLORIDE: 104 meq/L (ref 96–112)
CO2: 29 meq/L (ref 19–32)
CREATININE: 0.98 mg/dL (ref 0.40–1.50)
GFR: 80.93 mL/min (ref >60.00)
GLUCOSE: 147 mg/dL — AB (ref 70–99)
POTASSIUM: 4.1 meq/L (ref 3.5–5.1)
Sodium: 140 mEq/L (ref 135–145)

## 2015-04-10 NOTE — Telephone Encounter (Signed)
Medication refilled per protocol. 

## 2015-04-10 NOTE — Addendum Note (Signed)
Addended by: Eulis Foster on: 04/10/2015 08:09 AM   Modules accepted: Orders

## 2015-05-28 ENCOUNTER — Telehealth: Payer: Self-pay | Admitting: Cardiovascular Disease

## 2015-05-28 ENCOUNTER — Telehealth: Payer: Self-pay | Admitting: *Deleted

## 2015-05-28 MED ORDER — METOPROLOL SUCCINATE ER 25 MG PO TB24: 25.0000 mg | ORAL_TABLET | Freq: Every day | ORAL | Status: DC

## 2015-05-28 NOTE — Telephone Encounter (Signed)
Left message for patient to call me back to discuss Metoprolol; patient is prescribed metoprolol tartrate so he should be taking bid.

## 2015-05-28 NOTE — Telephone Encounter (Signed)
Lets change it to Metooprolol succinate 25 mg a day .

## 2015-05-28 NOTE — Telephone Encounter (Signed)
Patient requests a refill on metoprolol. He stated that he takes 25mg  bid, but his last office visit has it also listed as 25mg  qd. Please advise. Thanks, MI

## 2015-05-28 NOTE — Telephone Encounter (Signed)
Follow Up   Pt calling to speak to Rn

## 2015-05-28 NOTE — Telephone Encounter (Signed)
Spoke with patient and advised him that metoprolol tartrate is short acting and should be taken twice daily.  I advised that at last office visit with Dr. Acie Fredrickson his heart rate was 54 bpm and so Dr. Acie Fredrickson advised that if his heart rate has continued in that same range that he should switch to metoprolol succinate 25 mg daily. Patient states heart rate is usually in the 60's bpm and states today's BP was 861'U systolic over 83'F diastolic.  He states he feels great.  I have sent new Rx to [facility] per pt request and advised him to call back with questions or concerns.  He verbalized understanding and agreement.

## 2015-05-28 NOTE — Telephone Encounter (Signed)
Follow up  Pt will like for rn to call him back    Thank you

## 2015-06-04 ENCOUNTER — Telehealth: Payer: Self-pay | Admitting: Family Medicine

## 2015-06-04 MED ORDER — LISINOPRIL 2.5 MG PO TABS: 2.5000 mg | ORAL_TABLET | Freq: Every day | ORAL | Status: DC

## 2015-06-04 MED ORDER — METFORMIN HCL ER 500 MG PO TB24: ORAL_TABLET | ORAL | Status: DC

## 2015-06-04 NOTE — Telephone Encounter (Signed)
Prescription sent to pharmacy.

## 2015-06-04 NOTE — Telephone Encounter (Signed)
Patient is calling to get refills on his metformin and lisinopril to go to the [facility]  [phone]

## 2015-07-28 ENCOUNTER — Encounter: Payer: Self-pay | Admitting: Family Medicine

## 2015-07-28 ENCOUNTER — Ambulatory Visit (INDEPENDENT_AMBULATORY_CARE_PROVIDER_SITE_OTHER): Payer: Commercial Managed Care - HMO | Admitting: Family Medicine

## 2015-07-28 VITALS — BP 130/76 | HR 78 | Temp 98.0°F | Resp 16 | Ht 71.5 in | Wt 239.0 lb

## 2015-07-28 DIAGNOSIS — E669 Obesity, unspecified: Secondary | ICD-10-CM

## 2015-07-28 DIAGNOSIS — I10 Essential (primary) hypertension: Secondary | ICD-10-CM

## 2015-07-28 DIAGNOSIS — N400 Enlarged prostate without lower urinary tract symptoms: Secondary | ICD-10-CM

## 2015-07-28 DIAGNOSIS — E785 Hyperlipidemia, unspecified: Secondary | ICD-10-CM

## 2015-07-28 DIAGNOSIS — I480 Paroxysmal atrial fibrillation: Secondary | ICD-10-CM | POA: Diagnosis not present

## 2015-07-28 DIAGNOSIS — Z23 Encounter for immunization: Secondary | ICD-10-CM | POA: Diagnosis not present

## 2015-07-28 DIAGNOSIS — E119 Type 2 diabetes mellitus without complications: Secondary | ICD-10-CM

## 2015-07-28 LAB — CBC WITH DIFFERENTIAL/PLATELET
Basophils Absolute: 0 10*3/uL (ref 0.0–0.1)
Basophils Relative: 0 % (ref 0–1)
EOS PCT: 2 % (ref 0–5)
Eosinophils Absolute: 0.1 10*3/uL (ref 0.0–0.7)
HEMATOCRIT: 44.5 % (ref 39.0–52.0)
HEMOGLOBIN: 14.8 g/dL (ref 13.0–17.0)
LYMPHS ABS: 1.5 10*3/uL (ref 0.7–4.0)
LYMPHS PCT: 22 % (ref 12–46)
MCH: 30.9 pg (ref 26.0–34.0)
MCHC: 33.3 g/dL (ref 30.0–36.0)
MCV: 92.9 fL (ref 78.0–100.0)
MONO ABS: 0.7 10*3/uL (ref 0.1–1.0)
MPV: 11.1 fL (ref 8.6–12.4)
Monocytes Relative: 10 % (ref 3–12)
NEUTROS ABS: 4.6 10*3/uL (ref 1.7–7.7)
Neutrophils Relative %: 66 % (ref 43–77)
Platelets: 229 10*3/uL (ref 150–400)
RBC: 4.79 MIL/uL (ref 4.22–5.81)
RDW: 13.8 % (ref 11.5–15.5)
WBC: 6.9 10*3/uL (ref 4.0–10.5)

## 2015-07-28 LAB — COMPREHENSIVE METABOLIC PANEL
ALBUMIN: 4 g/dL (ref 3.6–5.1)
ALT: 15 U/L (ref 9–46)
AST: 15 U/L (ref 10–35)
Alkaline Phosphatase: 57 U/L (ref 40–115)
BILIRUBIN TOTAL: 0.7 mg/dL (ref 0.2–1.2)
BUN: 21 mg/dL (ref 7–25)
CALCIUM: 9 mg/dL (ref 8.6–10.3)
CO2: 24 mmol/L (ref 20–31)
Chloride: 105 mmol/L (ref 98–110)
Creat: 0.89 mg/dL (ref 0.70–1.25)
GLUCOSE: 123 mg/dL — AB (ref 70–99)
Potassium: 4.7 mmol/L (ref 3.5–5.3)
Sodium: 140 mmol/L (ref 135–146)
Total Protein: 6.4 g/dL (ref 6.1–8.1)

## 2015-07-28 LAB — LIPID PANEL
CHOLESTEROL: 170 mg/dL (ref 125–200)
HDL: 29 mg/dL — ABNORMAL LOW (ref >=40)
LDL Cholesterol: 113 mg/dL (ref <130)
TRIGLYCERIDES: 140 mg/dL (ref <150)
Total CHOL/HDL Ratio: 5.9 Ratio — ABNORMAL HIGH (ref <=5.0)
VLDL: 28 mg/dL (ref <30)

## 2015-07-28 LAB — HEMOGLOBIN A1C
Hgb A1c MFr Bld: 6.1 % — ABNORMAL HIGH (ref <5.7)
MEAN PLASMA GLUCOSE: 128 mg/dL — AB (ref <117)

## 2015-07-28 NOTE — Assessment & Plan Note (Signed)
BPH with history of obstruction, will try to get new urologist

## 2015-07-28 NOTE — Assessment & Plan Note (Signed)
Controlled no change to meds, ACE and Toprol

## 2015-07-28 NOTE — Assessment & Plan Note (Signed)
NSR, given samples of Eliquis for the next month, continue Toprol Flu and pneumona vaccine given

## 2015-07-28 NOTE — Assessment & Plan Note (Signed)
Discussed importance of diet adherence and weight control, he would benefit from doing this along with is wife to help with accountability

## 2015-07-28 NOTE — Patient Instructions (Signed)
Will schedule with new urologist Continue current meds Work on weight loss and diet Pneumonia vaccine and flu shot given F/U 4 months

## 2015-07-28 NOTE — Progress Notes (Signed)
Patient ID: Jimmy Bradshaw, male   DOB: 22-Jul-1946, 69 y.o.   MRN: [id number]   Subjective:    Patient ID: Jimmy Bradshaw, male    DOB: Jul 23, 1946, 69 y.o.   MRN: [id number]  Patient presents for 4 month F/U and Referral   Pt here to f/u chronic medical problems- DM- he is taking meds, but is not checking CBG, states he feels fine, last a1c 6%, no hypolglycemia symptoms but he has been eating a lot of Nutty buddies and has gained 12lbs since our last visit in July. He is not exercising either. He has not had any problems with his A fib, but his Eliquis is now in the Donut hole and he is unable to afford this.  Urology- he was upset with the bedside manner of his current urologist, he would like referral for a new urologist.  Followed for BPH s/p TURP    Review Of Systems:  GEN- denies fatigue, fever, weight loss,weakness, recent illness HEENT- denies eye drainage, change in vision, nasal discharge, CVS- denies chest pain, palpitations RESP- denies SOB, cough, wheeze ABD- denies N/V, change in stools, abd pain GU- denies dysuria, hematuria, dribbling, incontinence MSK- denies joint pain, muscle aches, injury Neuro- denies headache, dizziness, syncope, seizure activity       Objective:    BP 130/76 mmHg  Pulse 78  Temp(Src) 98 F (36.7 C) (Oral)  Resp 16  Ht 5' 11.5" (1.816 m)  Wt 239 lb (108.41 kg)  BMI 32.87 kg/m2 GEN- NAD, alert and oriented x3 HEENT- PERRL, EOMI, non injected sclera, pink conjunctiva, MMM, oropharynx clear CVS- RRR, no murmur RESP-CTAB EXT- No edema Pulses- Radial, DP- 2+        Assessment & Plan:      Problem List Items Addressed This Visit    Obesity (BMI 30.0-34.9)    Discussed importance of diet adherence and weight control, he would benefit from doing this along with is wife to help with accountability       Hyperlipidemia - Primary   Relevant Orders   Lipid panel   HTN (hypertension)    Controlled no change to meds, ACE and Toprol       Diabetes mellitus without complication (HCC)    Currently on XR MTF, recheck A1C      Relevant Orders   CBC with Differential/Platelet   Comprehensive metabolic panel   Hemoglobin A1c   BPH (benign prostatic hyperplasia)    BPH with history of obstruction, will try to get new urologist      Atrial fibrillation ([place])    NSR, given samples of Eliquis for the next month, continue Toprol Flu and pneumona vaccine given       Other Visit Diagnoses    Need for prophylactic vaccination and inoculation against influenza        Relevant Orders    Flu Vaccine QUAD 36+ mos PF IM (Fluarix & Fluzone Quad PF)    Need for prophylactic vaccination against Streptococcus pneumoniae (pneumococcus)        Relevant Orders    Pneumococcal polysaccharide vaccine 23-valent greater than or equal to 2yo subcutaneous/IM       Note: This dictation was prepared with Dragon dictation along with smaller phrase technology. Any transcriptional errors that result from this process are unintentional.

## 2015-07-28 NOTE — Assessment & Plan Note (Signed)
Currently on XR MTF, recheck A1C

## 2015-08-04 ENCOUNTER — Telehealth: Payer: Self-pay | Admitting: *Deleted

## 2015-08-04 NOTE — Telephone Encounter (Signed)
Submitted humana referral thru acuity connect for authorization on 08/01/15 to Dr. Preston Fleeting with authoriztion [id number]  Requesting provider: Lonell Grandchild [place],MD  Treating provider: Jan Fireman  Number of visits:6  Start Date: 09/24/15  End Date:03/22/16  Dx:N40.0-Benign prostatic hyperplasia without lower urinary tract symp

## 2015-08-13 ENCOUNTER — Telehealth: Payer: Self-pay | Admitting: *Deleted

## 2015-08-13 NOTE — Telephone Encounter (Signed)
Submitted humana referral thru acuity connect for authorization on 08/11/15 to Dr. Natale Lay with authorization [id number]  Requesting provider: Neysa Hotter  Treating provider: Natale Lay  Number of visits:6  Start Date: 08/21/15  End Date: 02/17/16  Dx:I48.0- Paroxysmal atrial fibrillation      I10-essential(primary) hypertension      E78.5- Hyperlipidemia,unspecified

## 2015-08-19 ENCOUNTER — Other Ambulatory Visit: Payer: Self-pay | Admitting: Family Medicine

## 2015-08-19 NOTE — Telephone Encounter (Signed)
Medication refilled per protocol. 

## 2015-08-20 NOTE — Addendum Note (Signed)
Addended by: Eulis Foster on: 08/20/2015 05:17 PM   Modules accepted: Orders

## 2015-08-21 ENCOUNTER — Other Ambulatory Visit (INDEPENDENT_AMBULATORY_CARE_PROVIDER_SITE_OTHER): Payer: Commercial Managed Care - HMO | Admitting: *Deleted

## 2015-08-21 DIAGNOSIS — I4891 Unspecified atrial fibrillation: Secondary | ICD-10-CM | POA: Diagnosis not present

## 2015-08-21 DIAGNOSIS — E785 Hyperlipidemia, unspecified: Secondary | ICD-10-CM | POA: Diagnosis not present

## 2015-08-21 DIAGNOSIS — I10 Essential (primary) hypertension: Secondary | ICD-10-CM

## 2015-08-21 DIAGNOSIS — I480 Paroxysmal atrial fibrillation: Secondary | ICD-10-CM

## 2015-08-21 LAB — HEPATIC FUNCTION PANEL
ALK PHOS: 54 U/L (ref 40–115)
ALT: 15 U/L (ref 9–46)
AST: 16 U/L (ref 10–35)
Albumin: 4.1 g/dL (ref 3.6–5.1)
BILIRUBIN DIRECT: 0.1 mg/dL (ref <=0.2)
BILIRUBIN INDIRECT: 0.5 mg/dL (ref 0.2–1.2)
Total Bilirubin: 0.6 mg/dL (ref 0.2–1.2)
Total Protein: 6.6 g/dL (ref 6.1–8.1)

## 2015-08-21 LAB — BASIC METABOLIC PANEL
BUN: 21 mg/dL (ref 7–25)
CO2: 26 mmol/L (ref 20–31)
CREATININE: 1.16 mg/dL (ref 0.70–1.25)
Calcium: 9.2 mg/dL (ref 8.6–10.3)
Chloride: 104 mmol/L (ref 98–110)
GLUCOSE: 124 mg/dL — AB (ref 65–99)
POTASSIUM: 4.3 mmol/L (ref 3.5–5.3)
Sodium: 138 mmol/L (ref 135–146)

## 2015-08-21 LAB — LIPID PANEL
CHOLESTEROL: 181 mg/dL (ref 125–200)
HDL: 25 mg/dL — ABNORMAL LOW (ref >=40)
LDL Cholesterol: 126 mg/dL (ref <130)
TRIGLYCERIDES: 151 mg/dL — AB (ref <150)
Total CHOL/HDL Ratio: 7.2 Ratio — ABNORMAL HIGH (ref <=5.0)
VLDL: 30 mg/dL (ref <30)

## 2015-08-21 NOTE — Addendum Note (Signed)
Addended by: Eulis Foster on: 08/21/2015 07:41 AM   Modules accepted: Orders

## 2015-09-24 DIAGNOSIS — N138 Other obstructive and reflux uropathy: Secondary | ICD-10-CM | POA: Diagnosis not present

## 2015-09-24 DIAGNOSIS — N401 Enlarged prostate with lower urinary tract symptoms: Secondary | ICD-10-CM | POA: Diagnosis not present

## 2015-09-25 ENCOUNTER — Telehealth: Payer: Self-pay | Admitting: Cardiovascular Disease

## 2015-09-25 MED ORDER — ROSUVASTATIN CALCIUM 10 MG PO TABS: 10.0000 mg | ORAL_TABLET | Freq: Every day | ORAL | Status: DC

## 2015-09-25 NOTE — Telephone Encounter (Signed)
Left message to call back  

## 2015-09-25 NOTE — Telephone Encounter (Signed)
Spoke with pt. He was unable to get cost of Zetia or rosuvastatin without actual prescription.  He would like to try rosuvastatin 10 mg daily.  He would like this sent to [facility] mail order.  He has had muscle pain on other statins in past and will let us know if unable to tolerate rosuvastatin.  He is seeing Dr. Acie Fredrickson on 10/07/15 and I told him any follow up lab work would be arranged at that appt.

## 2015-09-25 NOTE — Telephone Encounter (Signed)
New  Message ° °Pt returned call  °

## 2015-10-07 ENCOUNTER — Ambulatory Visit (INDEPENDENT_AMBULATORY_CARE_PROVIDER_SITE_OTHER): Payer: Commercial Managed Care - HMO | Admitting: Cardiovascular Disease

## 2015-10-07 ENCOUNTER — Encounter: Payer: Self-pay | Admitting: Cardiovascular Disease

## 2015-10-07 VITALS — BP 108/80 | HR 84 | Ht 71.5 in | Wt 228.8 lb

## 2015-10-07 DIAGNOSIS — I4891 Unspecified atrial fibrillation: Secondary | ICD-10-CM

## 2015-10-07 DIAGNOSIS — E785 Hyperlipidemia, unspecified: Secondary | ICD-10-CM

## 2015-10-07 NOTE — Patient Instructions (Addendum)
Medication Instructions:  Your physician recommends that you continue on your current medications as directed. Please refer to the Current Medication list given to you today.   Labwork: Your physician recommends that you return for lab work in: 2 months for fasting cholesterol, liver, and basic metabolic panel   Testing/Procedures: None Ordered   Follow-Up: Your physician wants you to follow-up in: 6 months with Dr. Acie Fredrickson.  You will receive a reminder letter in the mail two months in advance. If you don't receive a letter, please call our office to schedule the follow-up appointment.   If you need a refill on your cardiac medications before your next appointment, please call your pharmacy.   Thank you for choosing [facility]! Christen Bame, RN [phone]

## 2015-10-07 NOTE — Progress Notes (Signed)
Patient ID: Jimmy Bradshaw, male   DOB: 19-Jun-1946, 70 y.o.   MRN: [id number]    Date:  10/07/2015   ID:  Jimmy Bradshaw, DOB 08-Jul-1946, MRN [id number]  PCP:  Vic Blackbird, MD  Primary Cardiologist:  New(Nahser)  Chief Complaint  Patient presents with  . Follow-up    paroxysmal atrial fib   Problem list: 1. Paroxysmal atrial fibrillation  - CHADS2VASC score of 3  ( age, HTN, DM)  2. Hypertension 3. Hyperlipidemia 4. Diabetes mellitus 5. BPH - s/p TURP 6. S/p left TKA  - with supsequent infection of his prosthesis requiring re-do surgery .   History of Present Illness: Jimmy Bradshaw is a 70 y.o. male history of hypertension, hyperlipidemia, type 2 diabetes mellitus, anxiety, BPH. On 09/30/2014 he underwent TURP procedure and then a week later underwent irrigation and debridement of the left knee.  PICC line was placed and he was started on antibiotics for 4 weeks.  One of these surgeries patient supposedly went into atrial fibrillation with a rapid ventricular response. There are no EKGs or telemetry strips which confirmed this nor is there any documentation in the notes; either in the discharge summaries or with anesthesia.   The patient currently denies nausea, vomiting, fever, chest pain, shortness of breath, orthopnea, dizziness, PND, cough, congestion, abdominal pain, hematochezia, melena, lower extremity edema, claudication.   Feb. 19, 2016   He was getting his echo today and went in to rapid afib.  Still asymptomatic.  Was added on to my schedule.   November 21, 2014:  Jimmy Bradshaw is seen back today for follow up of his paroxysmal atrial fib.    We started him on Xarelto . Unfortunately, he had some bleeding in his urine .  He had just had recent prostate surgery ( [name]) .   He held his Xarelto for the next week.  He is doing better.   March 11, 2015: Jimmy Bradshaw has a hx of paroxysmal atrial fib. Has Eliquis but has not started it yet  His hematura has resolved.  Checks his pulse  and has not had recurrent atrial fib to his knowledge   Doing well.  Is having some leg pain.    Thinks it may be due to the pravachol  Still having knee problems .    Jan. 17, 2017:  Jimmy Bradshaw is doing well He has paroxysmal afib  - is back in Afib today - cannot tell that his HR is irregular.  No CP , no dyspnea.  He is not having any further urinary bleeding . His knee infection has resolved.    Wt Readings from Last 3 Encounters:  10/07/15 228 lb 12.8 oz (103.783 kg)  07/28/15 239 lb (108.41 kg)  03/25/15 227 lb (102.967 kg)     Past Medical History  Diagnosis Date  . Hyperlipidemia   . Anxiety   . H/O: gout   . History of colon polyps   . BPH (benign prostatic hypertrophy)   . Bladder neck obstruction   . Type 2 diabetes mellitus ([place])   . Frequency of urination   . Urgency of urination   . Nocturia   . Arthritis     HANDS,  SHOULDERS  . Wears hearing aid     BILATERAL    Current Outpatient Prescriptions  Medication Sig Dispense Refill  . apixaban (ELIQUIS) 5 MG TABS tablet Take 1 tablet (5 mg total) by mouth 2 (two) times daily. 60 tablet 11  . aspirin EC 81  MG tablet Take 1 tablet (81 mg total) by mouth daily.    Marland Kitchen buPROPion (WELLBUTRIN XL) 300 MG 24 hr tablet TAKE 1 TABLET EVERY MORNING 90 tablet 1  . lisinopril (PRINIVIL,ZESTRIL) 2.5 MG tablet TAKE 1 TABLET EVERY DAY 90 tablet 1  . metFORMIN (GLUCOPHAGE-XR) 500 MG 24 hr tablet TAKE 1 TABLET EVERY DAY WITH BREAKFAST 90 tablet 1  . metoprolol succinate (TOPROL-XL) 25 MG 24 hr tablet Take 1 tablet (25 mg total) by mouth daily. 90 tablet 3  . niacin 500 MG tablet Take 1,000 mg by mouth at bedtime.     . rosuvastatin (CRESTOR) 10 MG tablet Take 1 tablet (10 mg total) by mouth daily. 90 tablet 3   No current facility-administered medications for this visit.    Allergies:    Allergies  Allergen Reactions  . Pravastatin   . Penicillins Rash    Social History:  The patient  reports that he quit smoking about 16  years ago. His smoking use included Cigarettes. He quit after 4 years of use. He has never used smokeless tobacco. He reports that he drinks alcohol. He reports that he does not use illicit drugs.   Family history:   Family History  Problem Relation Age of Onset  . Adopted: Yes  . Colon cancer Father     ROS:  Please see the history of present illness.  All other systems reviewed and negative.   PHYSICAL EXAM: VS:  BP 108/80 mmHg  Pulse 84  Ht 5' 11.5" (1.816 m)  Wt 228 lb 12.8 oz (103.783 kg)  BMI 31.47 kg/m2 obese, well developed, in no acute distress HEENT: Pupils are equal round react to light accommodation extraocular movements are intact.  Neck: no JVDNo cervical lymphadenopathy. Cardiac: jirreg. Irreg.  without murmurs rubs or gallops.  Lungs:  clear to auscultation bilaterally, no wheezing, rhonchi or rales Abd: soft, nontender, positive bowel sounds all quadrants, no hepatosplenomegaly Ext: no lower extremity edema.  2+ radial and dorsalis pedis pulses. Skin: warm and dry Neuro:  Grossly normal  EKG:  Jan.  17, 2017 Atrial fib with rate of 84.    ASSESSMENT AND PLAN:  1. Paroxysmal Atrial fibrillation:  CHADS 2VASC score of 3 ( Age, HTN, DM)    He has now been found to have paroxysmal atrial fibrillation.  He is back in A-fib and is completely asymptomatic. Will continue rate control and anticoagulation.   He'll call us back if he develops any further bleeding. I'll see him in 6  months for follow-up visit.   2. Essential hypertension: Patient's blood pressure is well-controlled. Continue current meds   3. Hyperlipidemia: Continue to follow.  Followed by primary.  He is on Crestor now.  Seems to be tolerating it well. Will check labs in 6 monhs   4.  Diabetes mellitus-followed by his general medical doctor.   Thayer Headings, [name], MD, [facility] 10/07/2015, 11:14 AM [address],  [place] Pager [phone]

## 2015-10-08 ENCOUNTER — Other Ambulatory Visit: Payer: Self-pay | Admitting: Family Medicine

## 2015-10-08 NOTE — Telephone Encounter (Signed)
Refill appropriate and filled per protocol. 

## 2015-10-16 DIAGNOSIS — H524 Presbyopia: Secondary | ICD-10-CM | POA: Diagnosis not present

## 2015-10-16 DIAGNOSIS — H5210 Myopia, unspecified eye: Secondary | ICD-10-CM | POA: Diagnosis not present

## 2015-10-18 DIAGNOSIS — Z0100 Encounter for examination of eyes and vision without abnormal findings: Secondary | ICD-10-CM | POA: Diagnosis not present

## 2015-11-25 ENCOUNTER — Other Ambulatory Visit: Payer: Self-pay | Admitting: Family Medicine

## 2015-11-25 ENCOUNTER — Ambulatory Visit (INDEPENDENT_AMBULATORY_CARE_PROVIDER_SITE_OTHER): Payer: Commercial Managed Care - HMO | Admitting: Family Medicine

## 2015-11-25 ENCOUNTER — Encounter: Payer: Self-pay | Admitting: Family Medicine

## 2015-11-25 VITALS — BP 124/78 | HR 80 | Temp 97.9°F | Resp 18 | Ht 72.0 in | Wt 225.0 lb

## 2015-11-25 DIAGNOSIS — E669 Obesity, unspecified: Secondary | ICD-10-CM | POA: Diagnosis not present

## 2015-11-25 DIAGNOSIS — I10 Essential (primary) hypertension: Secondary | ICD-10-CM | POA: Diagnosis not present

## 2015-11-25 DIAGNOSIS — E119 Type 2 diabetes mellitus without complications: Secondary | ICD-10-CM

## 2015-11-25 DIAGNOSIS — Z1159 Encounter for screening for other viral diseases: Secondary | ICD-10-CM

## 2015-11-25 DIAGNOSIS — E785 Hyperlipidemia, unspecified: Secondary | ICD-10-CM

## 2015-11-25 LAB — CBC WITH DIFFERENTIAL/PLATELET
BASOS PCT: 0 % (ref 0–1)
Basophils Absolute: 0 10*3/uL (ref 0.0–0.1)
Eosinophils Absolute: 0.2 10*3/uL (ref 0.0–0.7)
Eosinophils Relative: 2 % (ref 0–5)
HEMATOCRIT: 43.6 % (ref 39.0–52.0)
Hemoglobin: 14.9 g/dL (ref 13.0–17.0)
LYMPHS ABS: 1.6 10*3/uL (ref 0.7–4.0)
LYMPHS PCT: 20 % (ref 12–46)
MCH: 31.4 pg (ref 26.0–34.0)
MCHC: 34.2 g/dL (ref 30.0–36.0)
MCV: 92 fL (ref 78.0–100.0)
MONOS PCT: 9 % (ref 3–12)
MPV: 10.6 fL (ref 8.6–12.4)
Monocytes Absolute: 0.7 10*3/uL (ref 0.1–1.0)
NEUTROS ABS: 5.7 10*3/uL (ref 1.7–7.7)
NEUTROS PCT: 69 % (ref 43–77)
PLATELETS: 215 10*3/uL (ref 150–400)
RBC: 4.74 MIL/uL (ref 4.22–5.81)
RDW: 13.6 % (ref 11.5–15.5)
WBC: 8.2 10*3/uL (ref 4.0–10.5)

## 2015-11-25 LAB — COMPREHENSIVE METABOLIC PANEL
ALK PHOS: 55 U/L (ref 40–115)
ALT: 21 U/L (ref 9–46)
AST: 17 U/L (ref 10–35)
Albumin: 4 g/dL (ref 3.6–5.1)
BUN: 20 mg/dL (ref 7–25)
CO2: 28 mmol/L (ref 20–31)
Calcium: 9.2 mg/dL (ref 8.6–10.3)
Chloride: 102 mmol/L (ref 98–110)
Creat: 1.06 mg/dL (ref 0.70–1.25)
GLUCOSE: 115 mg/dL — AB (ref 70–99)
POTASSIUM: 4.8 mmol/L (ref 3.5–5.3)
Sodium: 138 mmol/L (ref 135–146)
Total Bilirubin: 0.5 mg/dL (ref 0.2–1.2)
Total Protein: 6.6 g/dL (ref 6.1–8.1)

## 2015-11-25 LAB — LIPID PANEL
CHOL/HDL RATIO: 4.2 ratio (ref <=5.0)
CHOLESTEROL: 133 mg/dL (ref 125–200)
HDL: 32 mg/dL — AB (ref >=40)
LDL Cholesterol: 80 mg/dL (ref <130)
Triglycerides: 107 mg/dL (ref <150)
VLDL: 21 mg/dL (ref <30)

## 2015-11-25 MED ORDER — ROSUVASTATIN CALCIUM 10 MG PO TABS: 10.0000 mg | ORAL_TABLET | Freq: Every day | ORAL | Status: DC

## 2015-11-25 NOTE — Patient Instructions (Signed)
Release of records- [facility] report  We will call with lab results  F/U 4 months

## 2015-11-25 NOTE — Assessment & Plan Note (Signed)
Diabetes has been well-controlled. Recheck his A1c and his renal function. He did have his eye exam I will get those records.

## 2015-11-25 NOTE — Assessment & Plan Note (Signed)
Goal is LDL less than 100 he is tolerating the Crestor at this time

## 2015-11-25 NOTE — Progress Notes (Signed)
Patient ID: Jimmy Bradshaw, male   DOB: July 12, 1946, 70 y.o.   MRN: [id number]    Subjective:    Patient ID: Jimmy Bradshaw, male    DOB: Nov 29, 1945, 70 y.o.   MRN: [id number]  Patient presents for 4 mth check up  Pt here for F/U chronic medical problems. No new concerns, had mild URI a month or so again, now resolved  DM- last A1C 6.1%, CBG - he is not checking  , he is on Metformin 500mg  XR and lisinopril 2.5mg  daily for renal protection, had eye visit, hs new glasses   Hyperlipidemia- last LDL 126, cardiology started Crestor 10mg , he has A fib as well and is on Eliquis   - he has had myalgias with other statins in the past  Reviewed cardiology note   Review Of Systems:  GEN- denies fatigue, fever, weight loss,weakness, recent illness HEENT- denies eye drainage, change in vision, nasal discharge, CVS- denies chest pain, palpitations RESP- denies SOB, cough, wheeze ABD- denies N/V, change in stools, abd pain GU- denies dysuria, hematuria, dribbling, incontinence MSK- denies joint pain, muscle aches, injury Neuro- denies headache, dizziness, syncope, seizure activity       Objective:    BP 124/78 mmHg  Pulse 80  Temp(Src) 97.9 F (36.6 C) (Oral)  Resp 18  Ht 6' (1.829 m)  Wt 225 lb (102.059 kg)  BMI 30.51 kg/m2 GEN- NAD, alert and oriented x3 HEENT- PERRL, EOMI, non injected sclera, pink conjunctiva, MMM, oropharynx clear CVS- irregular rhythem, normal rate no murmur RESP-CTAB EXT- No edema Pulses- Radial, 2+        Assessment & Plan:      Problem List Items Addressed This Visit    Obesity (BMI 30.0-34.9)    He continues to work on weight loss and there is good improvement today. We may be able to stop his metformin      Hyperlipidemia    Goal is LDL less than 100 he is tolerating the Crestor at this time      Relevant Medications   rosuvastatin (CRESTOR) 10 MG tablet   Other Relevant Orders   Lipid panel   HTN (hypertension)    Blood pressure well  controlled to change her medication      Relevant Medications   rosuvastatin (CRESTOR) 10 MG tablet   Diabetes mellitus without complication ([place]) - Primary    Diabetes has been well-controlled. Recheck his A1c and his renal function. He did have his eye exam I will get those records.      Relevant Medications   rosuvastatin (CRESTOR) 10 MG tablet   Other Relevant Orders   CBC with Differential/Platelet   Comprehensive metabolic panel   Hemoglobin A1c    Other Visit Diagnoses    Need for hepatitis C screening test        Relevant Orders    Hepatitis C antibody, reflex       Note: This dictation was prepared with Dragon dictation along with smaller phrase technology. Any transcriptional errors that result from this process are unintentional.

## 2015-11-25 NOTE — Assessment & Plan Note (Signed)
He continues to work on weight loss and there is good improvement today. We may be able to stop his metformin

## 2015-11-25 NOTE — Assessment & Plan Note (Signed)
Blood pressure well controlled to change her medication 

## 2015-11-26 LAB — HEMOGLOBIN A1C
Hgb A1c MFr Bld: 5.9 % — ABNORMAL HIGH (ref <5.7)
Mean Plasma Glucose: 123 mg/dL — ABNORMAL HIGH (ref <117)

## 2015-11-26 LAB — HEPATITIS C ANTIBODY: HCV Ab: NEGATIVE

## 2015-11-28 ENCOUNTER — Other Ambulatory Visit: Payer: Self-pay | Admitting: Family Medicine

## 2015-11-28 MED ORDER — ROSUVASTATIN CALCIUM 10 MG PO TABS: 10.0000 mg | ORAL_TABLET | Freq: Every day | ORAL | Status: DC

## 2015-11-28 MED ORDER — BUPROPION HCL ER (XL) 300 MG PO TB24: 300.0000 mg | ORAL_TABLET | Freq: Every morning | ORAL | Status: DC

## 2015-12-03 ENCOUNTER — Other Ambulatory Visit: Payer: Commercial Managed Care - HMO

## 2015-12-29 ENCOUNTER — Other Ambulatory Visit: Payer: Self-pay | Admitting: *Deleted

## 2015-12-29 MED ORDER — APIXABAN 5 MG PO TABS: 5.0000 mg | ORAL_TABLET | Freq: Two times a day (BID) | ORAL | Status: DC

## 2015-12-30 ENCOUNTER — Encounter: Payer: Self-pay | Admitting: Family Medicine

## 2015-12-30 ENCOUNTER — Ambulatory Visit (INDEPENDENT_AMBULATORY_CARE_PROVIDER_SITE_OTHER): Payer: Commercial Managed Care - HMO | Admitting: Family Medicine

## 2015-12-30 VITALS — BP 128/78 | HR 82 | Temp 98.0°F | Resp 16 | Ht 72.0 in | Wt 222.0 lb

## 2015-12-30 DIAGNOSIS — W57XXXA Bitten or stung by nonvenomous insect and other nonvenomous arthropods, initial encounter: Secondary | ICD-10-CM | POA: Diagnosis not present

## 2015-12-30 DIAGNOSIS — T148 Other injury of unspecified body region: Secondary | ICD-10-CM | POA: Diagnosis not present

## 2015-12-30 MED ORDER — DOXYCYCLINE HYCLATE 100 MG PO TABS: 100.0000 mg | ORAL_TABLET | Freq: Two times a day (BID) | ORAL | Status: DC

## 2015-12-30 NOTE — Progress Notes (Signed)
Patient ID: Jimmy Bradshaw, male   DOB: March 03, 1946, 70 y.o.   MRN: [id number]    Subjective:    Patient ID: Jimmy Bradshaw, male    DOB: 21-Jan-1946, 70 y.o.   MRN: [id number]  Patient presents for Tick Bite Patient here tick bite to his left leg. He pulled off on Sunday he thought was initially a scab. Initially was just a red mark but then yesterday he noticed a red ring start a possible rounded. He has not had any fever no new joint pain no swollen lymph nodes. He feels well otherwise.    Review Of Systems:  GEN- denies fatigue, fever, weight loss,weakness, recent illness HEENT- denies eye drainage, change in vision, nasal discharge, CVS- denies chest pain, palpitations RESP- denies SOB, cough, wheeze ABD- denies N/V, change in stools, abd painy Neuro- denies headache, dizziness, syncope, seizure activity       Objective:    BP 128/78 mmHg  Pulse 82  Temp(Src) 98 F (36.7 C) (Oral)  Resp 16  Ht 6' (1.829 m)  Wt 222 lb (100.699 kg)  BMI 30.10 kg/m2  SpO2 97% GEN- NAD, alert and oriented x3 Skin- Left leg- medial aspect- erythematous scab at center, dime size ring of faint erythema surrounding , NT no induration        Assessment & Plan:      Problem List Items Addressed This Visit    None    Visit Diagnoses    Tick bite    -  Primary    Now with signs of possible erythema migrans/lyme treat with doxycycline BID for 7 days, discussed sun exposure with medication       Note: This dictation was prepared with Dragon dictation along with smaller phrase technology. Any transcriptional errors that result from this process are unintentional.

## 2015-12-30 NOTE — Patient Instructions (Signed)
Take antibiotics as prescribed Call for any concerns  F/u AS PREVIOUS

## 2016-01-05 ENCOUNTER — Telehealth: Payer: Self-pay | Admitting: *Deleted

## 2016-01-05 NOTE — Telephone Encounter (Signed)
Patient left a message on the refill voicemail wanting to know if he could take aleve or advil. He can be reached at [phone]. Please advise. Thanks, MI

## 2016-01-05 NOTE — Telephone Encounter (Signed)
Spoke with patient who states he is having pain at the sight of his achilles tendon and wants to know if it is safe to take advil or aleve.  I advised that he may take NSAIDs for short term but if his pain does not improve, to call PCP for evaluation.  He is aware of the risk of taking NSAIDs with eliquis.  He thanked me for the call.

## 2016-03-22 ENCOUNTER — Telehealth: Payer: Self-pay | Admitting: Family Medicine

## 2016-03-22 DIAGNOSIS — E119 Type 2 diabetes mellitus without complications: Secondary | ICD-10-CM

## 2016-03-22 NOTE — Telephone Encounter (Signed)
Pt is requesting a referral to [facility].

## 2016-03-25 DIAGNOSIS — L84 Corns and callosities: Secondary | ICD-10-CM | POA: Diagnosis not present

## 2016-03-25 DIAGNOSIS — M216X1 Other acquired deformities of right foot: Secondary | ICD-10-CM | POA: Diagnosis not present

## 2016-03-25 DIAGNOSIS — M216X2 Other acquired deformities of left foot: Secondary | ICD-10-CM | POA: Diagnosis not present

## 2016-03-25 DIAGNOSIS — B070 Plantar wart: Secondary | ICD-10-CM | POA: Diagnosis not present

## 2016-03-25 DIAGNOSIS — E119 Type 2 diabetes mellitus without complications: Secondary | ICD-10-CM | POA: Diagnosis not present

## 2016-03-25 DIAGNOSIS — L602 Onychogryphosis: Secondary | ICD-10-CM | POA: Diagnosis not present

## 2016-04-15 DIAGNOSIS — B070 Plantar wart: Secondary | ICD-10-CM | POA: Diagnosis not present

## 2016-05-21 NOTE — Telephone Encounter (Signed)
Referral placed for diabetic foot care

## 2016-06-14 ENCOUNTER — Ambulatory Visit (INDEPENDENT_AMBULATORY_CARE_PROVIDER_SITE_OTHER): Payer: Commercial Managed Care - HMO | Admitting: Family Medicine

## 2016-06-14 ENCOUNTER — Encounter: Payer: Self-pay | Admitting: Family Medicine

## 2016-06-14 VITALS — BP 118/80 | HR 98 | Temp 98.2°F | Resp 18 | Wt 230.0 lb

## 2016-06-14 DIAGNOSIS — I889 Nonspecific lymphadenitis, unspecified: Secondary | ICD-10-CM

## 2016-06-14 MED ORDER — AZITHROMYCIN 250 MG PO TABS: ORAL_TABLET | ORAL | 0 refills | Status: DC

## 2016-06-14 NOTE — Progress Notes (Signed)
Subjective:    Patient ID: Jimmy Bradshaw, male    DOB: 02-05-1946, 70 y.o.   MRN: [id number]  HPI Patient reports a 2 day history of severe pain starting in his left ear and radiating under his mandible towards his chin. The pain is described as a deep intense ache. There are no exacerbating or alleviating factors. At roughly the same time, the patient developed a runny nose and cough. His wife has been sick with an upper respiratory infection. On examination today, both tympanic membranes are pearly-gray and healthy with no middle ear effusion. The patient has been complaining of a mild sore throat but there is no erythema in the posterior oropharynx. There is no exudate. The patient has no sinus tenderness to palpation in the frontal or maxillary sinuses bilaterally. His lungs are clear. He is in atrial fibrillation. There is no palpable mass in the neck. There is no visible rash. There is no carotid bruit. There is no upper extremity swelling he is on chronic anticoagulation. Past Medical History:  Diagnosis Date  . Anxiety   . Arthritis    HANDS,  SHOULDERS  . Bladder neck obstruction   . BPH (benign prostatic hypertrophy)   . Frequency of urination   . H/O: gout   . History of colon polyps   . Hyperlipidemia   . Nocturia   . Type 2 diabetes mellitus ([place])   . Urgency of urination   . Wears hearing aid    BILATERAL   Past Surgical History:  Procedure Laterality Date  . COLONOSCOPY  08-30-2011  . I&D KNEE WITH POLY EXCHANGE Left 10/08/2014   Procedure: IRRIGATION AND DEBRIDEMENT LEFT UNI KNEE ARTHOPLASTY  WITH POLY EXCHANGE;  Surgeon: Mauri Pole, MD;  Location: [facility];  Service: Orthopedics;  Laterality: Left;  . KNEE ARTHROSCOPY Left 11/ 2013  . PARTIAL KNEE ARTHROPLASTY Left 02/05/2013   Procedure: LEFT KNEE MEDIAL UNICOMPARTMENTAL KNEE;  Surgeon: Mauri Pole, MD;  Location: [facility];  Service: Orthopedics;  Laterality: Left;  . TRANSURETHRAL RESECTION OF PROSTATE N/A  09/30/2014   Procedure: TRANSURETHRAL RESECTION OF THE PROSTATE WITH GYRUS INSTRUMENTS;  Surgeon: Bernestine Amass, MD;  Location[facility];  Service: Urology;  Laterality: N/A;   Current Outpatient Prescriptions on File Prior to Visit  Medication Sig Dispense Refill  . apixaban (ELIQUIS) 5 MG TABS tablet Take 1 tablet (5 mg total) by mouth 2 (two) times daily. 180 tablet 3  . aspirin EC 81 MG tablet Take 1 tablet (81 mg total) by mouth daily.    Marland Kitchen buPROPion (WELLBUTRIN XL) 300 MG 24 hr tablet Take 1 tablet (300 mg total) by mouth every morning. 90 tablet 3  . lisinopril (PRINIVIL,ZESTRIL) 2.5 MG tablet TAKE 1 TABLET EVERY DAY 90 tablet 1  . metFORMIN (GLUCOPHAGE-XR) 500 MG 24 hr tablet TAKE 1 TABLET EVERY DAY WITH BREAKFAST 90 tablet 1  . metoprolol succinate (TOPROL-XL) 25 MG 24 hr tablet Take 1 tablet (25 mg total) by mouth daily. 90 tablet 3  . rosuvastatin (CRESTOR) 10 MG tablet Take 1 tablet (10 mg total) by mouth daily. 90 tablet 3   No current facility-administered medications on file prior to visit.    Allergies  Allergen Reactions  . Pravastatin   . Penicillins Rash   Social History   Social History  . Marital status: Married    Spouse name: N/A  . Number of children: N/A  . Years of education: N/A   Occupational  History  . Not on file.   Social History Main Topics  . Smoking status: Former Smoker    Years: 4.00    Types: Cigarettes    Quit date: 12/08/1998  . Smokeless tobacco: Never Used  . Alcohol use Yes     Comment: RARE  . Drug use: No  . Sexual activity: Not on file   Other Topics Concern  . Not on file   Social History Narrative  . No narrative on file      Review of Systems  All other systems reviewed and are negative.      Objective:   Physical Exam  Constitutional: He appears well-developed and well-nourished.  HENT:  Right Ear: External ear normal.  Left Ear: External ear normal.  Nose: Nose normal.  Mouth/Throat:  Oropharynx is clear and moist. No oropharyngeal exudate.  Eyes: Conjunctivae are normal. Pupils are equal, round, and reactive to light.  Neck: Normal range of motion. Neck supple. No JVD present. No thyromegaly present.  Cardiovascular: Normal rate and normal heart sounds.  An irregularly irregular rhythm present.  Pulmonary/Chest: Effort normal and breath sounds normal. No respiratory distress. He has no wheezes. He has no rales.  Abdominal: Soft. Bowel sounds are normal.  Lymphadenopathy:    He has no cervical adenopathy.  Skin: No rash noted. No erythema.  Vitals reviewed.         Assessment & Plan:  Lymphadenitis - Plan: azithromycin (ZITHROMAX) 250 MG tablet, CBC with Differential/Platelet, COMPLETE METABOLIC PANEL WITH GFR  The patient's pain is out of proportion to exam. There is no evidence of an ear infection. There is no evidence of a dental infection as the patient has no teeth. He has upper and lower partials. There is no evidence of a sinus infection. There is no evidence of pharyngitis. It is possible he is lymphadenitis as he is tender in the area encompassing the anterior cervical chain. I will start the patient on a Z-Pak for possible lymphadenitis. I recommended a short course of Motrin 400 mg every 8 hours for 48 hours and then recheck. If pain is not improving at that time, I would obtain a CT scan of the neck given the severity of the patient's pain. Monitor for shingles in this area. Also consider trigeminal neuralgia.  Recheck 48 hours.

## 2016-06-16 ENCOUNTER — Other Ambulatory Visit: Payer: Self-pay | Admitting: Pharmacist

## 2016-06-16 NOTE — Patient Outreach (Signed)
Outreach call to [facility] regarding his request for follow up from the [facility] Medication Adherence Campaign. Left a HIPAA compliant message on the patient's voicemail.   Harlow Asa, PharmD Clinical Pharmacist [facility] [phone]

## 2016-06-18 ENCOUNTER — Encounter: Payer: Self-pay | Admitting: Family Medicine

## 2016-06-18 ENCOUNTER — Ambulatory Visit (INDEPENDENT_AMBULATORY_CARE_PROVIDER_SITE_OTHER): Payer: Commercial Managed Care - HMO | Admitting: Family Medicine

## 2016-06-18 VITALS — BP 132/80 | HR 76 | Temp 98.1°F | Resp 14 | Ht 72.0 in | Wt 231.0 lb

## 2016-06-18 DIAGNOSIS — R319 Hematuria, unspecified: Secondary | ICD-10-CM | POA: Diagnosis not present

## 2016-06-18 DIAGNOSIS — Z23 Encounter for immunization: Secondary | ICD-10-CM

## 2016-06-18 DIAGNOSIS — E119 Type 2 diabetes mellitus without complications: Secondary | ICD-10-CM | POA: Diagnosis not present

## 2016-06-18 DIAGNOSIS — N400 Enlarged prostate without lower urinary tract symptoms: Secondary | ICD-10-CM | POA: Diagnosis not present

## 2016-06-18 LAB — URINALYSIS, ROUTINE W REFLEX MICROSCOPIC
Bilirubin Urine: NEGATIVE
Glucose, UA: NEGATIVE
Ketones, ur: NEGATIVE
LEUKOCYTES UA: NEGATIVE
NITRITE: NEGATIVE
PROTEIN: NEGATIVE
Specific Gravity, Urine: 1.025 (ref 1.001–1.035)
pH: 5.5 (ref 5.0–8.0)

## 2016-06-18 LAB — CBC WITH DIFFERENTIAL/PLATELET
BASOS ABS: 0 {cells}/uL (ref 0–200)
Basophils Relative: 0 %
EOS PCT: 2 %
Eosinophils Absolute: 140 cells/uL (ref 15–500)
HCT: 44.5 % (ref 38.5–50.0)
HEMOGLOBIN: 15.1 g/dL (ref 13.0–17.0)
LYMPHS ABS: 2100 {cells}/uL (ref 850–3900)
LYMPHS PCT: 30 %
MCH: 30.7 pg (ref 27.0–33.0)
MCHC: 33.9 g/dL (ref 32.0–36.0)
MCV: 90.4 fL (ref 80.0–100.0)
MONOS PCT: 9 %
MPV: 11.1 fL (ref 7.5–12.5)
Monocytes Absolute: 630 cells/uL (ref 200–950)
NEUTROS PCT: 59 %
Neutro Abs: 4130 cells/uL (ref 1500–7800)
PLATELETS: 221 10*3/uL (ref 140–400)
RBC: 4.92 MIL/uL (ref 4.20–5.80)
RDW: 13.9 % (ref 11.0–15.0)
WBC: 7 10*3/uL (ref 3.8–10.8)

## 2016-06-18 LAB — URINALYSIS, MICROSCOPIC ONLY
CRYSTALS: NONE SEEN [HPF]
YEAST: NONE SEEN [HPF]

## 2016-06-18 LAB — BASIC METABOLIC PANEL
BUN: 22 mg/dL (ref 7–25)
CALCIUM: 9.2 mg/dL (ref 8.6–10.3)
CO2: 28 mmol/L (ref 20–31)
Chloride: 107 mmol/L (ref 98–110)
Creat: 0.92 mg/dL (ref 0.70–1.25)
Glucose, Bld: 100 mg/dL — ABNORMAL HIGH (ref 70–99)
POTASSIUM: 5 mmol/L (ref 3.5–5.3)
SODIUM: 141 mmol/L (ref 135–146)

## 2016-06-18 MED ORDER — CEPHALEXIN 500 MG PO CAPS: 500.0000 mg | ORAL_CAPSULE | Freq: Two times a day (BID) | ORAL | 0 refills | Status: DC

## 2016-06-18 NOTE — Assessment & Plan Note (Signed)
Recheck his A1c nonfasting labs.  Urinalysis culture sent. I will have him take another 3 days of the cephalexin in case there is underlying infection. I think the culprit is actually mixing anti-inflammatories with his eliquis If the hematuria returns I recommended a follow-up with his urologist he has known BPH

## 2016-06-18 NOTE — Patient Instructions (Addendum)
We will call with culture  Take antibiotic twice a day for another 3 days No aleve ,ibuprofen, Tylenol is fine to take  F/U 4 months

## 2016-06-18 NOTE — Progress Notes (Signed)
   Subjective:    Patient ID: Jimmy Bradshaw, male    DOB: March 30, 1946, 70 y.o.   MRN: [id number]  Patient presents for Hematuria (x1 day- noted blood in urine- states that he saw very dark concentrated urine that looked like it had blood in in- has since cleared)  Patient here with gross hematuria. He was actually seen on Monday secondary to lymphadenopathy air pressure and pain he was prescribed azithromycin however he are ready been taking cephalexin which he is started the day prior and he has continued to do so. Those symptoms have now resolved his lymph nodes have gone down. He noticed once a afternoon when he was urinating he had gross hematuria about 3 times a day and cleared up the next day. He did not have any pressure pain with urination no abdominal pain. He is currently on Eliquis  secondary to his A. fib but he admits that he has been taking Aleve and ibuprofen as well.  Diabetes mellitus is been well controlled he is due for repeat A1c is on metformin 500 mg once a day but his weight is up 8 pounds states that he had been stress eating making a lot of desserts recently   Review Of Systems:  GEN- denies fatigue, fever, weight loss,weakness, recent illness HEENT- denies eye drainage, change in vision, nasal discharge, CVS- denies chest pain, palpitations RESP- denies SOB, cough, wheeze ABD- denies N/V, change in stools, abd pain GU- denies dysuria, +hematuria, dribbling, incontinence MSK- denies joint pain, muscle aches, injury Neuro- denies headache, dizziness, syncope, seizure activity       Objective:    BP 132/80 (BP Location: Left Arm, Patient Position: Sitting, Cuff Size: Normal)   Pulse 76   Temp 98.1 F (36.7 C) (Oral)   Resp 14   Ht 6' (1.829 m)   Wt 231 lb (104.8 kg)   BMI 31.33 kg/m  GEN- NAD, alert and oriented x3 HEENT- PERRL, EOMI, non injected sclera, pink conjunctiva, MMM, oropharynx clear Neck- Supple, no LAD  CVS- irregular rhythem, normal rate , no  murmur RESP-CTAB ABD-NABS,soft,NT,ND, no CVA tenderness  EXT- No edema Pulses- Radial 2+        Assessment & Plan:      Problem List Items Addressed This Visit    Diabetes mellitus without complication ([place])    Recheck his A1c nonfasting labs.  Urinalysis culture sent. I will have him take another 3 days of the cephalexin in case there is underlying infection. I think the culprit is actually mixing anti-inflammatories with his eliquis If the hematuria returns I recommended a follow-up with his urologist he has known BPH      Relevant Orders   CBC with Differential/Platelet   Basic metabolic panel   Hemoglobin A1c   BPH (benign prostatic hyperplasia)    Other Visit Diagnoses    Hematuria    -  Primary   Relevant Orders   Urinalysis, Routine w reflex microscopic (not at [facility]) (Completed)   CBC with Differential/Platelet   Urine culture   Need for prophylactic vaccination and inoculation against influenza       Relevant Orders   Flu Vaccine QUAD 36+ mos PF IM (Fluarix & Fluzone Quad PF) (Completed)      Note: This dictation was prepared with Dragon dictation along with smaller phrase technology. Any transcriptional errors that result from this process are unintentional.

## 2016-06-19 LAB — HEMOGLOBIN A1C
HEMOGLOBIN A1C: 6.2 % — AB (ref <5.7)
MEAN PLASMA GLUCOSE: 131 mg/dL

## 2016-06-19 LAB — URINE CULTURE: ORGANISM ID, BACTERIA: NO GROWTH

## 2016-06-28 ENCOUNTER — Encounter: Payer: Self-pay | Admitting: Family Medicine

## 2016-06-28 ENCOUNTER — Ambulatory Visit (INDEPENDENT_AMBULATORY_CARE_PROVIDER_SITE_OTHER): Payer: Commercial Managed Care - HMO | Admitting: Family Medicine

## 2016-06-28 VITALS — BP 132/80 | HR 92 | Temp 97.8°F | Resp 16 | Wt 231.0 lb

## 2016-06-28 DIAGNOSIS — R040 Epistaxis: Secondary | ICD-10-CM | POA: Diagnosis not present

## 2016-06-28 NOTE — Patient Instructions (Signed)
Stop the ASA  Use vaseline Call if you have recurrent bleed and ENT appointment will be done  F/U as previous

## 2016-06-28 NOTE — Progress Notes (Signed)
   Subjective:    Patient ID: [name], male    DOB: October 31, 1945, 70 y.o.   MRN: [id number]  Patient presents for office visit (nose bleed x 1 week)   Patient with nosebleeds for the past week. He was actually evaluated on 929 after having episode of hematuria that has not reoccurred. He denies having any headache sinus pressure drainage states he would just get spontaneous bleeding from the right side of his nares. He is on aspirin and helpless. He has not had any nosebleeds the past couple of days.  He is also concerned about needing antibiotics prior to his colonoscopy as he has had septic joint before. He was been told by his orthopedic surgeon that he needs antibiotics prior to any procedures.    Review Of Systems:  GEN- denies fatigue, fever, weight loss,weakness, recent illness HEENT- denies eye drainage, change in vision, nasal discharge, CVS- denies chest pain, palpitations RESP- denies SOB, cough, wheeze ABD- denies N/V, change in stools, abd pain GU- denies dysuria, hematuria, dribbling, incontinence MSK- denies joint pain, muscle aches, injury Neuro- denies headache, dizziness, syncope, seizure activity       Objective:    BP 132/80   Pulse 92   Temp 97.8 F (36.6 C) (Oral)   Resp 16   Wt 231 lb (104.8 kg)   SpO2 98%   BMI 31.33 kg/m  GEN- NAD, alert and oriented x3    Blood pressure repeated 132/80 HEENT- PERRL, EOMI, non injected sclera, pink conjunctiva, MMM, oropharynx clear, nares clear, no scabs, no old blood Neck- Supple, no LAD  CVS- irregular rhythem, normal rate , no murmur RESP-CTAB       Assessment & Plan:      Problem List Items Addressed This Visit    None    Visit Diagnoses    Epistaxis    -  Primary   he is on ASA and Eliquis, with 2 episodes of bleeding recently though short lived D/C the ASA. He looks well, BP controlled, no CP, no SOB. Likly had posterior bleed, I can not see anything today on ENT exam Advised vaseline to keep  moisture in. ENT urgently if this reoccurs. No history of any blood dyscrasia otherwise ,recent labs normal      Note: This dictation was prepared with Dragon dictation along with smaller phrase technology. Any transcriptional errors that result from this process are unintentional.

## 2016-06-29 ENCOUNTER — Encounter: Payer: Self-pay | Admitting: Family Medicine

## 2016-07-02 ENCOUNTER — Telehealth: Payer: Self-pay | Admitting: *Deleted

## 2016-07-02 NOTE — Telephone Encounter (Signed)
Received call from [facility] with [facility].   Reports that Dr. Michail Sermon made aware of MD recommendations.  States that Dr. Johnsie Cancel is giving orders to hold Eliqis. Dr. Michail Sermon aware and is asking PCP to order ABTx required for procedure on 08/04/2016.  MD please advise.   Melissa- [phone]~ telephone.

## 2016-07-02 NOTE — Telephone Encounter (Signed)
He has some Keflex at home, which he used  After his knee infection Have him take Keflex 500mg  BID for 5 days before procedure

## 2016-07-05 NOTE — Telephone Encounter (Signed)
Call placed to patient. [place].   Call placed to [facility] with [facility]. Advised of MD recommendations.

## 2016-07-06 MED ORDER — CEPHALEXIN 500 MG PO CAPS: 500.0000 mg | ORAL_CAPSULE | Freq: Two times a day (BID) | ORAL | 0 refills | Status: DC

## 2016-07-06 NOTE — Telephone Encounter (Signed)
Patient returned call and made aware.   States that he is not sure that he has ABTx at home. Requested refill.   Prescription sent to pharmacy.

## 2016-07-15 ENCOUNTER — Telehealth: Payer: Self-pay | Admitting: Family Medicine

## 2016-07-15 NOTE — Telephone Encounter (Signed)
rec'd Craig Staggers # [id number] for Dr Wilford Corner, MD for Dx Z86.010 per hx colon polyps  For 6 visits from 07/14/16 - 01/10/17.

## 2016-07-28 ENCOUNTER — Encounter (INDEPENDENT_AMBULATORY_CARE_PROVIDER_SITE_OTHER): Payer: Commercial Managed Care - HMO | Admitting: Family Medicine

## 2016-07-29 ENCOUNTER — Ambulatory Visit (INDEPENDENT_AMBULATORY_CARE_PROVIDER_SITE_OTHER): Payer: Commercial Managed Care - HMO | Admitting: Family Medicine

## 2016-07-29 ENCOUNTER — Encounter: Payer: Self-pay | Admitting: Family Medicine

## 2016-07-29 VITALS — BP 100/70 | HR 82 | Temp 97.8°F | Resp 18 | Ht 72.0 in | Wt 231.0 lb

## 2016-07-29 DIAGNOSIS — M25512 Pain in left shoulder: Secondary | ICD-10-CM

## 2016-07-29 NOTE — Progress Notes (Signed)
Error pt appt moved until 11/9

## 2016-07-29 NOTE — Progress Notes (Signed)
Subjective:    Patient ID: Jimmy Bradshaw, male    DOB: Jul 25, 1946, 70 y.o.   MRN: [id number]  HPI Patient has a history of an injury to his left shoulder more than 10 years ago when he slipped and fell. Since that time, he has had intermittent pain in his left shoulder. Recently he developed constant pain with abduction greater than 80. He has severe pain with empty can testing and with Hawkins testing. There've been no new falls or injuries. He has pain and aching throbbing pain in his shoulder when he tries to sleep. He has difficult time lifting his arm above his head and has a difficult time coming his hair or putting on shirts. He has obvious symptoms of impingement syndrome along with tendinitis in his rotator cuff. 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 Anxiety   . Arthritis    HANDS,  SHOULDERS  . Bladder neck obstruction   . BPH (benign prostatic hypertrophy)   . Frequency of urination   . H/O: gout   . History of colon polyps   . Hyperlipidemia   . Nocturia   . Type 2 diabetes mellitus ([place])   . Urgency of urination   . Wears hearing aid    BILATERAL   Past Surgical History:  Procedure Laterality Date  . COLONOSCOPY  08-30-2011  . I&D KNEE WITH POLY EXCHANGE Left 10/08/2014   Procedure: IRRIGATION AND DEBRIDEMENT LEFT UNI KNEE ARTHOPLASTY  WITH POLY EXCHANGE;  Surgeon: Mauri Pole, MD;  Location: [facility];  Service: Orthopedics;  Laterality: Left;  . KNEE ARTHROSCOPY Left 11/ 2013  . PARTIAL KNEE ARTHROPLASTY Left 02/05/2013   Procedure: LEFT KNEE MEDIAL UNICOMPARTMENTAL KNEE;  Surgeon: Mauri Pole, MD;  Location: [facility];  Service: Orthopedics;  Laterality: Left;  . TRANSURETHRAL RESECTION OF PROSTATE N/A 09/30/2014   Procedure: TRANSURETHRAL RESECTION OF THE PROSTATE WITH GYRUS INSTRUMENTS;  Surgeon: Bernestine Amass, MD;  [facility];  Service: Urology;  Laterality: N/A;   Current Outpatient  Prescriptions on File Prior to Visit  Medication Sig Dispense Refill  . apixaban (ELIQUIS) 5 MG TABS tablet Take 1 tablet (5 mg total) by mouth 2 (two) times daily. 180 tablet 3  . aspirin EC 81 MG tablet Take 1 tablet (81 mg total) by mouth daily.    . cephALEXin (KEFLEX) 500 MG capsule Take 1 capsule (500 mg total) by mouth 2 (two) times daily. 10 capsule 0  . lisinopril (PRINIVIL,ZESTRIL) 2.5 MG tablet TAKE 1 TABLET EVERY DAY 90 tablet 1  . metFORMIN (GLUCOPHAGE-XR) 500 MG 24 hr tablet TAKE 1 TABLET EVERY DAY WITH BREAKFAST 90 tablet 1  . metoprolol succinate (TOPROL-XL) 25 MG 24 hr tablet Take 1 tablet (25 mg total) by mouth daily. 90 tablet 3  . rosuvastatin (CRESTOR) 10 MG tablet Take 1 tablet (10 mg total) by mouth daily. 90 tablet 3   No current facility-administered medications on file prior to visit.    Allergies  Allergen Reactions  . Pravastatin   . Penicillins Rash   Social History   Social History  . Marital status: Married    Spouse name: N/A  . Number of children: N/A  . Years of education: N/A   Occupational History  . Not on file.   Social History Main Topics  . Smoking status: Former Smoker    Years: 4.00    Types: Cigarettes  Quit date: 12/08/1998  . Smokeless tobacco: Never Used  . Alcohol use Yes     Comment: RARE  . Drug use: No  . Sexual activity: Not on file   Other Topics Concern  . Not on file   Social History Narrative  . No narrative on file      Review of Systems  All other systems reviewed and are negative.      Objective:   Physical Exam  Constitutional: He appears well-developed and well-nourished.  HENT:  Right Ear: External ear normal.  Left Ear: External ear normal.  Nose: Nose normal.  Mouth/Throat: Oropharynx is clear and moist. No oropharyngeal exudate.  Eyes: Conjunctivae are normal. Pupils are equal, round, and reactive to light.  Neck: Normal range of motion. Neck supple. No JVD present. No thyromegaly present.    Cardiovascular: Normal rate and normal heart sounds.  An irregularly irregular rhythm present.  Pulmonary/Chest: Effort normal and breath sounds normal. No respiratory distress. He has no wheezes. He has no rales.  Abdominal: Soft. Bowel sounds are normal.  Lymphadenopathy:    He has no cervical adenopathy.  Skin: No rash noted. No erythema.  Vitals reviewed. see HPI       Assessment & Plan:  Acute pain of left shoulder  I suspect that the patient has a partial tear in his suprasternal not as with underlying impingement syndrome and bursitis in his left shoulder. We discussed this at length. Prior to performing MRI, the patient would like to try cortisone shot. Using sterile technique, I injected the left shoulder with 2 mL of lidocaine, 2 mL of Marcaine, and 2 mL of 40 mg per mL Kenalog. The patient tolerated the procedure well without complication

## 2016-08-04 DIAGNOSIS — K640 First degree hemorrhoids: Secondary | ICD-10-CM | POA: Diagnosis not present

## 2016-08-04 DIAGNOSIS — K5730 Diverticulosis of large intestine without perforation or abscess without bleeding: Secondary | ICD-10-CM | POA: Diagnosis not present

## 2016-08-04 DIAGNOSIS — K635 Polyp of colon: Secondary | ICD-10-CM | POA: Diagnosis not present

## 2016-08-04 DIAGNOSIS — Z86010 Personal history of colonic polyps: Secondary | ICD-10-CM | POA: Diagnosis not present

## 2016-08-04 DIAGNOSIS — D124 Benign neoplasm of descending colon: Secondary | ICD-10-CM | POA: Diagnosis not present

## 2016-08-04 LAB — HM COLONOSCOPY

## 2016-08-10 DIAGNOSIS — K635 Polyp of colon: Secondary | ICD-10-CM | POA: Diagnosis not present

## 2016-08-24 ENCOUNTER — Telehealth: Payer: Self-pay | Admitting: *Deleted

## 2016-08-24 DIAGNOSIS — R040 Epistaxis: Secondary | ICD-10-CM

## 2016-08-24 NOTE — Telephone Encounter (Signed)
Place referral needs appt ASAP

## 2016-08-24 NOTE — Telephone Encounter (Signed)
Received call from patient.   Reports that he is continuing to have issues with nose bleeds. Requested referral to ENT.   MD please advise.

## 2016-08-26 NOTE — Telephone Encounter (Signed)
Appt has been made for Monday 08/30/16, have been trying to reach patient.

## 2016-08-30 DIAGNOSIS — R040 Epistaxis: Secondary | ICD-10-CM | POA: Insufficient documentation

## 2016-08-30 DIAGNOSIS — J3089 Other allergic rhinitis: Secondary | ICD-10-CM | POA: Diagnosis not present

## 2016-09-25 ENCOUNTER — Other Ambulatory Visit: Payer: Self-pay | Admitting: Family Medicine

## 2016-09-25 ENCOUNTER — Other Ambulatory Visit: Payer: Self-pay | Admitting: Cardiovascular Disease

## 2016-10-03 ENCOUNTER — Encounter: Payer: Self-pay | Admitting: Cardiovascular Disease

## 2016-10-07 ENCOUNTER — Ambulatory Visit: Payer: Self-pay | Admitting: Family Medicine

## 2016-10-08 ENCOUNTER — Ambulatory Visit (INDEPENDENT_AMBULATORY_CARE_PROVIDER_SITE_OTHER): Payer: Medicare HMO | Admitting: Family Medicine

## 2016-10-08 ENCOUNTER — Encounter: Payer: Self-pay | Admitting: Family Medicine

## 2016-10-08 VITALS — BP 130/82 | HR 101 | Temp 97.9°F | Resp 18 | Wt 238.0 lb

## 2016-10-08 DIAGNOSIS — M25512 Pain in left shoulder: Secondary | ICD-10-CM | POA: Diagnosis not present

## 2016-10-08 NOTE — Progress Notes (Signed)
Subjective:    Patient ID: Jimmy Bradshaw, male    DOB: 03-Jun-1946, 71 y.o.   MRN: [id number]  HPI  07/29/16 Patient has a history of an injury to his left shoulder more than 10 years ago when he slipped and fell. Since that time, he has had intermittent pain in his left shoulder. Recently he developed constant pain with abduction greater than 80. He has severe pain with empty can testing and with Hawkins testing. There've been no new falls or injuries. He has pain and aching throbbing pain in his shoulder when he tries to sleep. He has difficult time lifting his arm above his head and has a difficult time coming his hair or putting on shirts. He has obvious symptoms of impingement syndrome along with tendinitis in his rotator cuff. 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  At that time, my plan was:  I suspect that the patient has a partial tear in his suprasternal not as with underlying impingement syndrome and bursitis in his left shoulder. We discussed this at length. Prior to performing MRI, the patient would like to try cortisone shot. Using sterile technique, I injected the left shoulder with 2 mL of lidocaine, 2 mL of Marcaine, and 2 mL of 40 mg per mL Kenalog. The patient tolerated the procedure well without complication  [id number] Is here today for follow-up. He states the shot lasted a good 8 weeks but has slowly started to wear off and is now causing him significant pain again. He is requesting a repeat cortisone injection Past Medical History:  Diagnosis Date  . Anxiety   . Arthritis    HANDS,  SHOULDERS  . Bladder neck obstruction   . BPH (benign prostatic hypertrophy)   . Frequency of urination   . H/O: gout   . History of colon polyps   . Hyperlipidemia   . Nocturia   . Type 2 diabetes mellitus ([place])   . Urgency of urination   . Wears hearing aid    BILATERAL   Past Surgical History:  Procedure Laterality Date  . COLONOSCOPY  08-30-2011  . I&D KNEE WITH POLY  EXCHANGE Left 10/08/2014   Procedure: IRRIGATION AND DEBRIDEMENT LEFT UNI KNEE ARTHOPLASTY  WITH POLY EXCHANGE;  Surgeon: Mauri Pole, MD;  Location: [facility];  Service: Orthopedics;  Laterality: Left;  . KNEE ARTHROSCOPY Left 11/ 2013  . PARTIAL KNEE ARTHROPLASTY Left 02/05/2013   Procedure: LEFT KNEE MEDIAL UNICOMPARTMENTAL KNEE;  Surgeon: Mauri Pole, MD;  Location: [facility];  Service: Orthopedics;  Laterality: Left;  . TRANSURETHRAL RESECTION OF PROSTATE N/A 09/30/2014   Procedure: TRANSURETHRAL RESECTION OF THE PROSTATE WITH GYRUS INSTRUMENTS;  Surgeon: Bernestine Amass, MD;  Location: [facility];  Service: Urology;  Laterality: N/A;   Current Outpatient Prescriptions on File Prior to Visit  Medication Sig Dispense Refill  . apixaban (ELIQUIS) 5 MG TABS tablet Take 1 tablet (5 mg total) by mouth 2 (two) times daily. 180 tablet 3  . aspirin EC 81 MG tablet Take 1 tablet (81 mg total) by mouth daily.    . cephALEXin (KEFLEX) 500 MG capsule Take 1 capsule (500 mg total) by mouth 2 (two) times daily. 10 capsule 0  . lisinopril (PRINIVIL,ZESTRIL) 2.5 MG tablet TAKE 1 TABLET EVERY DAY 90 tablet 1  . metFORMIN (GLUCOPHAGE-XR) 500 MG 24 hr tablet TAKE 1 TABLET EVERY DAY WITH BREAKFAST 90 tablet 1  . metoprolol succinate (TOPROL-XL) 25 MG 24 hr  tablet Take 1 tablet (25 mg total) by mouth daily. *Please call and schedule a one year follow up appointment* 90 tablet 0  . rosuvastatin (CRESTOR) 10 MG tablet Take 1 tablet (10 mg total) by mouth daily. 90 tablet 3   No current facility-administered medications on file prior to visit.    Allergies  Allergen Reactions  . Pravastatin   . Penicillins Rash   Social History   Social History  . Marital status: Married    Spouse name: N/A  . Number of children: N/A  . Years of education: N/A   Occupational History  . Not on file.   Social History Main Topics  . Smoking status: Former Smoker    Years: 4.00    Types: Cigarettes     Quit date: 12/08/1998  . Smokeless tobacco: Never Used  . Alcohol use Yes     Comment: RARE  . Drug use: No  . Sexual activity: Not on file   Other Topics Concern  . Not on file   Social History Narrative  . No narrative on file      Review of Systems  All other systems reviewed and are negative.      Objective:   Physical Exam  Constitutional: He appears well-developed and well-nourished.  HENT:  Right Ear: External ear normal.  Left Ear: External ear normal.  Nose: Nose normal.  Mouth/Throat: Oropharynx is clear and moist. No oropharyngeal exudate.  Eyes: Conjunctivae are normal. Pupils are equal, round, and reactive to light.  Neck: Normal range of motion. Neck supple. No JVD present. No thyromegaly present.  Cardiovascular: Normal rate, regular rhythm and normal heart sounds.   Pulmonary/Chest: Effort normal and breath sounds normal. No respiratory distress. He has no wheezes. He has no rales.  Abdominal: Soft. Bowel sounds are normal.  Lymphadenopathy:    He has no cervical adenopathy.  Skin: No rash noted. No erythema.  Vitals reviewed. see HPI       Assessment & Plan:  Acute pain of left shoulder We discussed the patient's options. I recommended an MRI to rule out a tear that could be correctable surgically. He would like to repeat cortisone injection at least one more time before proceeding with an MRI. If the pain returns, he agrees to the MRI at that point. 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 I'm not sure that he will be a candidate for surgical correction given the length of time since injury but I feel an MRI is warranted prior to committing the patient to cortisone injections every 3 months. He would like to try one additional time. Using sterile technique, I injected the left shoulder with 2 mL of lidocaine, 2 mL of Marcaine, and 2 mL of 40 mg per mL Kenalog. He tolerated the procedure without complication

## 2016-10-18 ENCOUNTER — Ambulatory Visit (INDEPENDENT_AMBULATORY_CARE_PROVIDER_SITE_OTHER): Payer: Medicare HMO | Admitting: Family Medicine

## 2016-10-18 ENCOUNTER — Encounter: Payer: Self-pay | Admitting: Family Medicine

## 2016-10-18 VITALS — BP 128/88 | HR 100 | Temp 98.2°F | Resp 14 | Ht 72.0 in | Wt 228.0 lb

## 2016-10-18 DIAGNOSIS — E669 Obesity, unspecified: Secondary | ICD-10-CM

## 2016-10-18 DIAGNOSIS — I10 Essential (primary) hypertension: Secondary | ICD-10-CM

## 2016-10-18 DIAGNOSIS — E7800 Pure hypercholesterolemia, unspecified: Secondary | ICD-10-CM

## 2016-10-18 DIAGNOSIS — I480 Paroxysmal atrial fibrillation: Secondary | ICD-10-CM | POA: Diagnosis not present

## 2016-10-18 DIAGNOSIS — E66811 Obesity, class 1: Secondary | ICD-10-CM

## 2016-10-18 DIAGNOSIS — E119 Type 2 diabetes mellitus without complications: Secondary | ICD-10-CM

## 2016-10-18 LAB — CBC WITH DIFFERENTIAL/PLATELET
BASOS ABS: 0 {cells}/uL (ref 0–200)
Basophils Relative: 0 %
Eosinophils Absolute: 118 cells/uL (ref 15–500)
Eosinophils Relative: 1 %
HEMATOCRIT: 49.7 % (ref 38.5–50.0)
HEMOGLOBIN: 16.4 g/dL (ref 13.0–17.0)
LYMPHS ABS: 1652 {cells}/uL (ref 850–3900)
Lymphocytes Relative: 14 %
MCH: 31.1 pg (ref 27.0–33.0)
MCHC: 33 g/dL (ref 32.0–36.0)
MCV: 94.1 fL (ref 80.0–100.0)
MONO ABS: 708 {cells}/uL (ref 200–950)
MPV: 10.8 fL (ref 7.5–12.5)
Monocytes Relative: 6 %
NEUTROS ABS: 9322 {cells}/uL — AB (ref 1500–7800)
NEUTROS PCT: 79 %
Platelets: 245 10*3/uL (ref 140–400)
RBC: 5.28 MIL/uL (ref 4.20–5.80)
RDW: 14.4 % (ref 11.0–15.0)
WBC: 11.8 10*3/uL — ABNORMAL HIGH (ref 3.8–10.8)

## 2016-10-18 LAB — COMPREHENSIVE METABOLIC PANEL
ALBUMIN: 4.2 g/dL (ref 3.6–5.1)
ALT: 21 U/L (ref 9–46)
AST: 14 U/L (ref 10–35)
Alkaline Phosphatase: 50 U/L (ref 40–115)
BILIRUBIN TOTAL: 1.1 mg/dL (ref 0.2–1.2)
BUN: 25 mg/dL (ref 7–25)
CALCIUM: 9.8 mg/dL (ref 8.6–10.3)
CHLORIDE: 105 mmol/L (ref 98–110)
CO2: 24 mmol/L (ref 20–31)
CREATININE: 1.11 mg/dL (ref 0.70–1.25)
Glucose, Bld: 209 mg/dL — ABNORMAL HIGH (ref 70–99)
Potassium: 5.3 mmol/L (ref 3.5–5.3)
SODIUM: 138 mmol/L (ref 135–146)
TOTAL PROTEIN: 6.9 g/dL (ref 6.1–8.1)

## 2016-10-18 LAB — LIPID PANEL
CHOL/HDL RATIO: 4.1 ratio (ref <5.0)
CHOLESTEROL: 162 mg/dL (ref <200)
HDL: 40 mg/dL — ABNORMAL LOW (ref >40)
LDL Cholesterol: 96 mg/dL (ref <100)
Triglycerides: 131 mg/dL (ref <150)
VLDL: 26 mg/dL (ref <30)

## 2016-10-18 LAB — HEMOGLOBIN A1C
HEMOGLOBIN A1C: 6.4 % — AB (ref <5.7)
MEAN PLASMA GLUCOSE: 137 mg/dL

## 2016-10-18 LAB — TSH: TSH: 1.64 mIU/L (ref 0.40–4.50)

## 2016-10-18 MED ORDER — METOPROLOL SUCCINATE ER 25 MG PO TB24: 50.0000 mg | ORAL_TABLET | Freq: Every day | ORAL | 0 refills | Status: DC

## 2016-10-18 NOTE — Patient Instructions (Signed)
Take 2 of the metoprolol ( 50mg  once a day in the morning) this is for your heart rate We will call cardiology We will call with results F/U 4 months

## 2016-10-18 NOTE — Assessment & Plan Note (Signed)
Controlled.  

## 2016-10-18 NOTE — Progress Notes (Signed)
   Subjective:    Patient ID: Jimmy Bradshaw, male    DOB: 09-07-1946, 71 y.o.   MRN: [id number]  Patient presents for 4 month F/U (is not fasting)   Pt here to f/u chronic medical problems, currently followed by cardiology for A fib but last visit was in, stays out of rhythem , next appt in March, denies chest pain, SOB   Seen by ENT due to epistaxsis , no cauterization was needed    DM- Last A1C 6.2%, on metformin, lipids at goal last check at goal on crestor     Review Of Systems:  GEN- denies fatigue, fever, weight loss,weakness, recent illness HEENT- denies eye drainage, change in vision, nasal discharge, CVS- denies chest pain, palpitations RESP- denies SOB, cough, wheeze ABD- denies N/V, change in stools, abd pain GU- denies dysuria, hematuria, dribbling, incontinence MSK- denies joint pain, muscle aches, injury Neuro- denies headache, dizziness, syncope, seizure activity       Objective:    BP 128/88 (BP Location: Left Arm, Patient Position: Sitting, Cuff Size: Large)   Pulse 100 Comment: irregulatr  Temp 98.2 F (36.8 C) (Oral)   Resp 14   Ht 6' (1.829 m)   Wt 228 lb (103.4 kg)   SpO2 98%   BMI 30.92 kg/m  GEN- NAD, alert and oriented x3 HEENT- PERRL, EOMI, non injected sclera, pink conjunctiva, MMM, oropharynx clear Neck- Supple, no thyromegaly CVS- irregulary irregularly elevated heart rate 110, no murmur RESP-CTAB EXT- No edema Pulses- Radial, DP- 2+        Assessment & Plan:      Problem List Items Addressed This Visit    Obesity (BMI 30.0-34.9) - Primary   Hyperlipidemia   Relevant Medications   metoprolol succinate (TOPROL-XL) 25 MG 24 hr tablet   Other Relevant Orders   Lipid panel   HTN (hypertension)    Controlled       Relevant Medications   metoprolol succinate (TOPROL-XL) 25 MG 24 hr tablet   Other Relevant Orders   TSH   Diabetes mellitus without complication (HCC)    Check A1C goal < 7% currently on low dose MTF, Crestor and  ACEI      Relevant Orders   Hemoglobin A1c   Atrial fibrillation ([place])    He is in chronic atrial fibrillation however his rate is increasing even at rest he is in the low 100s. He is not symptomatic fine concerned that with even little exertion he may flip himself into a rapid ventricular response and increase his Toprol to 50 mg we'll get an earlier appointment with his cardiologist      Relevant Medications   metoprolol succinate (TOPROL-XL) 25 MG 24 hr tablet   Other Relevant Orders   CBC with Differential/Platelet   Comprehensive metabolic panel   TSH      Note: This dictation was prepared with Dragon dictation along with smaller phrase technology. Any transcriptional errors that result from this process are unintentional.

## 2016-10-18 NOTE — Assessment & Plan Note (Signed)
He is in chronic atrial fibrillation however his rate is increasing even at rest he is in the low 100s. He is not symptomatic fine concerned that with even little exertion he may flip himself into a rapid ventricular response and increase his Toprol to 50 mg we'll get an earlier appointment with his cardiologist

## 2016-10-18 NOTE — Assessment & Plan Note (Signed)
Check A1C goal < 7% currently on low dose MTF, Crestor and ACEI

## 2016-10-19 ENCOUNTER — Encounter: Payer: Self-pay | Admitting: Nurse Practitioner

## 2016-10-19 ENCOUNTER — Encounter: Payer: Self-pay | Admitting: Cardiovascular Disease

## 2016-10-19 ENCOUNTER — Ambulatory Visit (INDEPENDENT_AMBULATORY_CARE_PROVIDER_SITE_OTHER): Payer: Medicare HMO | Admitting: Cardiovascular Disease

## 2016-10-19 VITALS — BP 108/86 | HR 91 | Ht 71.0 in | Wt 226.1 lb

## 2016-10-19 DIAGNOSIS — I480 Paroxysmal atrial fibrillation: Secondary | ICD-10-CM | POA: Diagnosis not present

## 2016-10-19 MED ORDER — FLECAINIDE ACETATE 100 MG PO TABS: 100.0000 mg | ORAL_TABLET | Freq: Two times a day (BID) | ORAL | 3 refills | Status: DC

## 2016-10-19 MED ORDER — FLECAINIDE ACETATE 100 MG PO TABS: 100.0000 mg | ORAL_TABLET | Freq: Two times a day (BID) | ORAL | 0 refills | Status: DC

## 2016-10-19 NOTE — Progress Notes (Signed)
Patient ID: Jimmy Bradshaw, male   DOB: 08-07-1946, 71 y.o.   MRN: [id number]    Date:  10/19/2016   ID:  Jimmy Bradshaw, DOB Feb 16, 1946, MRN [id number]  PCP:  Vic Blackbird, MD  Primary Cardiologist:  New(Synai Prettyman)  Chief Complaint  Patient presents with  . Atrial Fibrillation   Problem list: 1. Paroxysmal atrial fibrillation  - CHADS2VASC score of 3  ( age, HTN, DM)  2. Hypertension 3. Hyperlipidemia 4. Diabetes mellitus 5. BPH - s/p TURP 6. S/p left TKA  - with supsequent infection of his prosthesis requiring re-do surgery .     Jimmy Bradshaw is a 71 y.o. male history of hypertension, hyperlipidemia, type 2 diabetes mellitus, anxiety, BPH. On 09/30/2014 he underwent TURP procedure and then a week later underwent irrigation and debridement of the left knee.  PICC line was placed and he was started on antibiotics for 4 weeks.  One of these surgeries patient supposedly went into atrial fibrillation with a rapid ventricular response. There are no EKGs or telemetry strips which confirmed this nor is there any documentation in the notes; either in the discharge summaries or with anesthesia.   The patient currently denies nausea, vomiting, fever, chest pain, shortness of breath, orthopnea, dizziness, PND, cough, congestion, abdominal pain, hematochezia, melena, lower extremity edema, claudication.   Feb. 19, 2016   He was getting his echo today and went in to rapid afib.  Still asymptomatic.  Was added on to my schedule.   November 21, 2014:  Jimmy Bradshaw is seen back today for follow up of his paroxysmal atrial fib.    We started him on Xarelto . Unfortunately, he had some bleeding in his urine .  He had just had recent prostate surgery ( [name]) .   He held his Xarelto for the next week.  He is doing better.   March 11, 2015: Jimmy Bradshaw has a hx of paroxysmal atrial fib. Has Eliquis but has not started it yet  His hematura has resolved.  Checks his pulse and has not had recurrent atrial fib to  his knowledge   Doing well.  Is having some leg pain.    Thinks it may be due to the pravachol  Still having knee problems .    Jan. 17, 2017:  Jimmy Bradshaw is doing well He has paroxysmal afib  - is back in Afib today - cannot tell that his HR is irregular.  No CP , no dyspnea.  He is not having any further urinary bleeding . His knee infection has resolved.   Jan. 30, 2018:  Jimmy Bradshaw is seen for visit  Dr. Buelah Manis increased the Toprol XL to 50 mg a day   Wt Readings from Last 3 Encounters:  10/19/16 226 lb 1.9 oz (102.6 kg)  10/18/16 228 lb (103.4 kg)  10/08/16 238 lb (108 kg)     Past Medical History:  Diagnosis Date  . Anxiety   . Arthritis    HANDS,  SHOULDERS  . Bladder neck obstruction   . BPH (benign prostatic hypertrophy)   . Frequency of urination   . H/O: gout   . History of colon polyps   . Hyperlipidemia   . Nocturia   . Type 2 diabetes mellitus ([place])   . Urgency of urination   . Wears hearing aid    BILATERAL    Current Outpatient Prescriptions  Medication Sig Dispense Refill  . apixaban (ELIQUIS) 5 MG TABS tablet Take 1 tablet (5 mg total) by  mouth 2 (two) times daily. 180 tablet 3  . lisinopril (PRINIVIL,ZESTRIL) 2.5 MG tablet Take 2.5 mg by mouth daily.    . metFORMIN (GLUCOPHAGE-XR) 500 MG 24 hr tablet Take 500 mg by mouth daily with breakfast.    . metoprolol succinate (TOPROL-XL) 25 MG 24 hr tablet Take 2 tablets (50 mg total) by mouth daily. *Please call and schedule a one year follow up appointment* 90 tablet 0  . rosuvastatin (CRESTOR) 10 MG tablet Take 1 tablet (10 mg total) by mouth daily. 90 tablet 3   No current facility-administered medications for this visit.     Allergies:    Allergies  Allergen Reactions  . Pravastatin Other (See Comments)    Headache and legs hurt  . Penicillins Rash    Social History:  The patient  reports that he quit smoking about 17 years ago. His smoking use included Cigarettes. He quit after 4.00 years of use.  He has never used smokeless tobacco. He reports that he drinks alcohol. He reports that he does not use drugs.   Family history:   Family History  Problem Relation Age of Onset  . Adopted: Yes  . Colon cancer Father     ROS:  Please see the history of present illness.  All other systems reviewed and negative.   PHYSICAL EXAM: VS:  BP 108/86   Pulse 91   Ht 5\' 11"  (1.803 m)   Wt 226 lb 1.9 oz (102.6 kg)   BMI 31.54 kg/m  obese, well developed, in no acute distress  HEENT: Pupils are equal round react to light accommodation extraocular movements are intact.  Neck: no JVD No cervical lymphadenopathy. Cardiac: jirreg. Irreg.  without murmurs rubs or gallops.  Lungs:  clear to auscultation bilaterally, no wheezing, rhonchi or rales  Abd: soft, nontender, positive bowel sounds all quadrants, no hepatosplenomegaly  Ext: no lower extremity edema.  2+ radial and dorsalis pedis pulses. Skin: warm and dry  Neuro:  Grossly normal  EKG:  Jan.  30, 2018  Atrial fib with rate of91    ASSESSMENT AND PLAN:  1. Paroxysmal Atrial fibrillation:  CHADS 2VASC score of 3 ( Age, HTN, DM)   His paroxysmal atrial ablation and has now become more persistent. I would like to start him on flecainide 100 mg twice a day. He will continue the Toprol-XL 50 mg a day area we will schedule him for a cardioversion later this week or the first of next week. We'll see him back for a stress Myoview study in approximately 2 weeks. He'll continue the same dose of Eliquis . I'll see him again in 3 months.  2. Essential hypertension: Patient's blood pressure is well-controlled. Continue current meds   3. Hyperlipidemia: Continue to follow.  Followed by primary.  He is on Crestor now.  Seems to be tolerating it well. Will check labs in 6 monhs   4.  Diabetes mellitus-followed by his general medical doctor.   Thayer Headings, [name], MD, [facility] 10/19/2016, 12:01 PM [address],  [place] Pager [phone]

## 2016-10-19 NOTE — Patient Instructions (Addendum)
Medication Instructions:  START Flecainide 100 mg twice daily   Labwork: TODAY - bmet, CBC, pt/inr   Testing/Procedures: Your physician has recommended that you have a Cardioversion (DCCV). Electrical Cardioversion uses a jolt of electricity to your heart either through paddles or wired patches attached to your chest. This is a controlled, usually prescheduled, procedure. Defibrillation is done under light anesthesia in the hospital, and you usually go home the day of the procedure. This is done to get your heart back into a normal rhythm. You are not awake for the procedure. Please see the instruction sheet given to you today.   Your physician has requested that you have an exercise stress myoview in 2 weeks. For further information please visit [email or web address]. Please follow instruction sheet, as given.   Follow-Up: Your physician recommends that you schedule a follow-up appointment in: 3 months with Dr. Acie Fredrickson   If you need a refill on your cardiac medications before your next appointment, please call your pharmacy.   Thank you for choosing [facility]! Christen Bame, RN [phone]

## 2016-10-20 LAB — BASIC METABOLIC PANEL
BUN / CREAT RATIO: 26 — AB (ref 10–24)
BUN: 28 mg/dL — ABNORMAL HIGH (ref 8–27)
CALCIUM: 10 mg/dL (ref 8.6–10.2)
CO2: 21 mmol/L (ref 18–29)
CREATININE: 1.09 mg/dL (ref 0.76–1.27)
Chloride: 100 mmol/L (ref 96–106)
GFR, EST AFRICAN AMERICAN: 80 mL/min/{1.73_m2} (ref >59)
GFR, EST NON AFRICAN AMERICAN: 69 mL/min/{1.73_m2} (ref >59)
Glucose: 103 mg/dL — ABNORMAL HIGH (ref 65–99)
Potassium: 5.1 mmol/L (ref 3.5–5.2)
Sodium: 142 mmol/L (ref 134–144)

## 2016-10-20 LAB — PROTIME-INR
INR: 1.1 (ref 0.8–1.2)
PROTHROMBIN TIME: 11.3 s (ref 9.1–12.0)

## 2016-10-20 LAB — CBC
HEMATOCRIT: 47.9 % (ref 37.5–51.0)
Hemoglobin: 16.6 g/dL (ref 13.0–17.7)
MCH: 30.8 pg (ref 26.6–33.0)
MCHC: 34.7 g/dL (ref 31.5–35.7)
MCV: 89 fL (ref 79–97)
Platelets: 270 10*3/uL (ref 150–379)
RBC: 5.39 x10E6/uL (ref 4.14–5.80)
RDW: 14.2 % (ref 12.3–15.4)
WBC: 15.5 10*3/uL — ABNORMAL HIGH (ref 3.4–10.8)

## 2016-10-21 ENCOUNTER — Other Ambulatory Visit: Payer: Medicare HMO | Admitting: *Deleted

## 2016-10-21 ENCOUNTER — Telehealth: Payer: Self-pay | Admitting: Cardiovascular Disease

## 2016-10-21 DIAGNOSIS — D72829 Elevated white blood cell count, unspecified: Secondary | ICD-10-CM | POA: Diagnosis not present

## 2016-10-21 NOTE — Telephone Encounter (Signed)
New Message    Pt states that Dr Acie Fredrickson wants him to have CBC blood work done before visit 10/22/16 , I do not see order

## 2016-10-21 NOTE — Telephone Encounter (Signed)
Spoke with patient and advised him that a repeat cbc is needed prior to his procedure next Tuesday. He denies symptoms of urinary tract or respiratory infection. He denies recent illness or fever. After verifying with Dr. Acie Fredrickson, I advised that we will also order a urinalysis to r/o potential causes of elevated WBC. Patient states he will likely come in today for lab work. Orders are in epic. He thanked me for the call.

## 2016-10-22 ENCOUNTER — Other Ambulatory Visit: Payer: Self-pay | Admitting: *Deleted

## 2016-10-22 DIAGNOSIS — D72829 Elevated white blood cell count, unspecified: Secondary | ICD-10-CM

## 2016-10-22 LAB — CBC WITH DIFFERENTIAL/PLATELET
BASOS: 0 %
Basophils Absolute: 0 10*3/uL (ref 0.0–0.2)
EOS (ABSOLUTE): 0.1 10*3/uL (ref 0.0–0.4)
Eos: 1 %
Hematocrit: 46 % (ref 37.5–51.0)
Hemoglobin: 15.5 g/dL (ref 13.0–17.7)
IMMATURE GRANS (ABS): 0.1 10*3/uL (ref 0.0–0.1)
Immature Granulocytes: 1 %
Lymphocytes Absolute: 1.5 10*3/uL (ref 0.7–3.1)
Lymphs: 11 %
MCH: 30.1 pg (ref 26.6–33.0)
MCHC: 33.7 g/dL (ref 31.5–35.7)
MCV: 89 fL (ref 79–97)
Monocytes Absolute: 1.1 10*3/uL — ABNORMAL HIGH (ref 0.1–0.9)
Monocytes: 8 %
NEUTROS ABS: 11.6 10*3/uL — AB (ref 1.4–7.0)
Neutrophils: 79 %
PLATELETS: 238 10*3/uL (ref 150–379)
RBC: 5.15 x10E6/uL (ref 4.14–5.80)
RDW: 14.3 % (ref 12.3–15.4)
WBC: 14.3 10*3/uL — ABNORMAL HIGH (ref 3.4–10.8)

## 2016-10-22 LAB — URINALYSIS
Bilirubin, UA: NEGATIVE
Glucose, UA: NEGATIVE
KETONES UA: NEGATIVE
LEUKOCYTES UA: NEGATIVE
NITRITE UA: NEGATIVE
PH UA: 5 (ref 5.0–7.5)
Protein, UA: NEGATIVE
RBC, UA: NEGATIVE
Specific Gravity, UA: 1.023 (ref 1.005–1.030)
Urobilinogen, Ur: 0.2 mg/dL (ref 0.2–1.0)

## 2016-10-25 ENCOUNTER — Other Ambulatory Visit: Payer: Medicare HMO

## 2016-10-25 DIAGNOSIS — D72829 Elevated white blood cell count, unspecified: Secondary | ICD-10-CM | POA: Diagnosis not present

## 2016-10-25 LAB — CBC WITH DIFFERENTIAL/PLATELET
BASOS PCT: 0 %
Basophils Absolute: 0 cells/uL (ref 0–200)
EOS PCT: 1 %
Eosinophils Absolute: 133 cells/uL (ref 15–500)
HCT: 47.9 % (ref 38.5–50.0)
Hemoglobin: 15.8 g/dL (ref 13.0–17.0)
Lymphocytes Relative: 19 %
Lymphs Abs: 2527 cells/uL (ref 850–3900)
MCH: 30.7 pg (ref 27.0–33.0)
MCHC: 33 g/dL (ref 32.0–36.0)
MCV: 93.2 fL (ref 80.0–100.0)
MONOS PCT: 8 %
MPV: 10.7 fL (ref 7.5–12.5)
Monocytes Absolute: 1064 cells/uL — ABNORMAL HIGH (ref 200–950)
NEUTROS ABS: 9576 {cells}/uL — AB (ref 1500–7800)
Neutrophils Relative %: 72 %
PLATELETS: 230 10*3/uL (ref 140–400)
RBC: 5.14 MIL/uL (ref 4.20–5.80)
RDW: 13.8 % (ref 11.0–15.0)
WBC: 13.3 10*3/uL — ABNORMAL HIGH (ref 3.8–10.8)

## 2016-10-26 ENCOUNTER — Other Ambulatory Visit: Payer: Self-pay | Admitting: Cardiovascular Disease

## 2016-10-26 ENCOUNTER — Ambulatory Visit (HOSPITAL_COMMUNITY): Payer: Medicare HMO | Admitting: Certified Registered"

## 2016-10-26 ENCOUNTER — Other Ambulatory Visit: Payer: Self-pay

## 2016-10-26 ENCOUNTER — Encounter (HOSPITAL_COMMUNITY)
Admission: RE | Disposition: A | Discharge status: Home / Self Care | Payer: Self-pay | Source: Ambulatory Visit | Attending: Cardiovascular Disease

## 2016-10-26 ENCOUNTER — Ambulatory Visit (HOSPITAL_COMMUNITY)
Admission: RE | Admit: 2016-10-26 | Discharge: 2016-10-26 | Disposition: A | Discharge status: Home / Self Care | Payer: Medicare HMO | Source: Ambulatory Visit | Attending: Cardiovascular Disease | Admitting: Cardiovascular Disease

## 2016-10-26 ENCOUNTER — Encounter (HOSPITAL_COMMUNITY): Payer: Self-pay

## 2016-10-26 DIAGNOSIS — M1990 Unspecified osteoarthritis, unspecified site: Secondary | ICD-10-CM | POA: Insufficient documentation

## 2016-10-26 DIAGNOSIS — I4891 Unspecified atrial fibrillation: Secondary | ICD-10-CM | POA: Insufficient documentation

## 2016-10-26 DIAGNOSIS — N320 Bladder-neck obstruction: Secondary | ICD-10-CM | POA: Insufficient documentation

## 2016-10-26 DIAGNOSIS — Z87891 Personal history of nicotine dependence: Secondary | ICD-10-CM | POA: Diagnosis not present

## 2016-10-26 DIAGNOSIS — F419 Anxiety disorder, unspecified: Secondary | ICD-10-CM | POA: Insufficient documentation

## 2016-10-26 DIAGNOSIS — E119 Type 2 diabetes mellitus without complications: Secondary | ICD-10-CM | POA: Diagnosis not present

## 2016-10-26 DIAGNOSIS — N401 Enlarged prostate with lower urinary tract symptoms: Secondary | ICD-10-CM | POA: Diagnosis not present

## 2016-10-26 DIAGNOSIS — I480 Paroxysmal atrial fibrillation: Secondary | ICD-10-CM | POA: Diagnosis not present

## 2016-10-26 DIAGNOSIS — Z79899 Other long term (current) drug therapy: Secondary | ICD-10-CM | POA: Diagnosis not present

## 2016-10-26 DIAGNOSIS — Z7984 Long term (current) use of oral hypoglycemic drugs: Secondary | ICD-10-CM | POA: Insufficient documentation

## 2016-10-26 DIAGNOSIS — E785 Hyperlipidemia, unspecified: Secondary | ICD-10-CM | POA: Insufficient documentation

## 2016-10-26 DIAGNOSIS — I10 Essential (primary) hypertension: Secondary | ICD-10-CM | POA: Diagnosis not present

## 2016-10-26 HISTORY — PX: CARDIOVERSION: SHX1299

## 2016-10-26 LAB — PATHOLOGIST SMEAR REVIEW

## 2016-10-26 SURGERY — CARDIOVERSION
Anesthesia: General

## 2016-10-26 MED ORDER — PROPOFOL 10 MG/ML IV BOLUS
INTRAVENOUS | Status: DC | PRN
  Administered 2016-10-26: 70 mg via INTRAVENOUS
  Administered 2016-10-26: 30 mg via INTRAVENOUS

## 2016-10-26 MED ORDER — LIDOCAINE HCL (CARDIAC) 20 MG/ML IV SOLN
INTRAVENOUS | Status: DC | PRN
  Administered 2016-10-26: 20 mg via INTRAVENOUS

## 2016-10-26 NOTE — Interval H&P Note (Signed)
History and Physical Interval Note:  10/26/2016 12:07 PM  Jimmy Bradshaw  has presented today for surgery, with the diagnosis of afib  The various methods of treatment have been discussed with the patient and family. After consideration of risks, benefits and other options for treatment, the patient has consented to  Procedure(s): CARDIOVERSION (N/A) as a surgical intervention .  The patient's history has been reviewed, patient examined, no change in status, stable for surgery.  I have reviewed the patient's chart and labs.  Questions were answered to the patient's satisfaction.     Nike Southwell [employer]

## 2016-10-26 NOTE — Anesthesia Postprocedure Evaluation (Signed)
Anesthesia Post Note  Patient: Jimmy Bradshaw  Procedure(s) Performed: Procedure(s) (LRB): CARDIOVERSION (N/A)  Patient location during evaluation: PACU Anesthesia Type: General Level of consciousness: awake and alert Pain management: pain level controlled Vital Signs Assessment: post-procedure vital signs reviewed and stable Respiratory status: spontaneous breathing, nonlabored ventilation, respiratory function stable and patient connected to nasal cannula oxygen Cardiovascular status: blood pressure returned to baseline and stable Postop Assessment: no signs of nausea or vomiting Anesthetic complications: no       Last Vitals:  Vitals:   10/26/16 1240 10/26/16 1250  BP: 96/66 105/73  Pulse: (!) 57 (!) 59  Resp: 14 (!) 21  Temp:      Last Pain:  Vitals:   10/26/16 1225  TempSrc: Oral                 [name]

## 2016-10-26 NOTE — Transfer of Care (Signed)
Immediate Anesthesia Transfer of Care Note  Patient: Jimmy Bradshaw  Procedure(s) Performed: Procedure(s): CARDIOVERSION (N/A)  Patient Location: Endoscopy Unit  Anesthesia Type:General  Level of Consciousness: awake, alert , oriented and sedated  Airway & Oxygen Therapy: Patient Spontanous Breathing  Post-op Assessment: Report given to RN, Post -op Vital signs reviewed and stable and Patient moving all extremities X 4  Post vital signs: Reviewed and stable  Last Vitals:  Vitals:   10/26/16 1105 10/26/16 1225  BP: 117/80 110/80  Pulse: 79 62  Resp: 16 19  Temp: 36.7 C 36.4 C    Last Pain:  Vitals:   10/26/16 1225  TempSrc: Oral         Complications: No apparent anesthesia complications

## 2016-10-26 NOTE — Anesthesia Preprocedure Evaluation (Signed)
Anesthesia Evaluation  Patient identified by MRN, date of birth, ID band Patient awake    Reviewed: Allergy & Precautions, NPO status , Patient's Chart, lab work & pertinent test results  Airway Mallampati: II  TM Distance: >3 FB     Dental   Pulmonary former smoker,    breath sounds clear to auscultation       Cardiovascular hypertension, + dysrhythmias Atrial Fibrillation  Rhythm:Regular Rate:Normal     Neuro/Psych    GI/Hepatic negative GI ROS, Neg liver ROS,   Endo/Other  diabetes, Type 2  Renal/GU negative Renal ROS     Musculoskeletal  (+) Arthritis ,   Abdominal   Peds  Hematology negative hematology ROS (+)   Anesthesia Other Findings   Reproductive/Obstetrics                             Anesthesia Physical Anesthesia Plan  ASA: II  Anesthesia Plan: General   Post-op Pain Management:    Induction: Intravenous  Airway Management Planned: Mask and Natural Airway  Additional Equipment:   Intra-op Plan:   Post-operative Plan:   Informed Consent: I have reviewed the patients History and Physical, chart, labs and discussed the procedure including the risks, benefits and alternatives for the proposed anesthesia with the patient or authorized representative who has indicated his/her understanding and acceptance.   Dental advisory given  Plan Discussed with: CRNA  Anesthesia Plan Comments:         Anesthesia Quick Evaluation

## 2016-10-26 NOTE — Procedures (Signed)
Electrical Cardioversion Procedure Note SIR ROTZ [id number] [birth year]  Procedure: Electrical Cardioversion Indications:  Atrial Fibrillation, he has been on Eliquis without missing doses.   Procedure Details Consent: Risks of procedure as well as the alternatives and risks of each were explained to the (patient/caregiver).  Consent for procedure obtained. Time Out: Verified patient identification, verified procedure, site/side was marked, verified correct patient position, special equipment/implants available, medications/allergies/relevent history reviewed, required imaging and test results available.  Performed  Patient placed on cardiac monitor, pulse oximetry, supplemental oxygen as necessary.  Sedation given: Propofol per anesthesiology Pacer pads placed anterior and posterior chest.  Cardioverted 3 time(s). The 2nd and 3rd time I used sternal pressure.  Cardioverted at [place].  Evaluation Findings: Post procedure EKG shows: NSR Complications: None Patient did tolerate procedure well.   Loralie Champagne 10/26/2016, 12:20 PM

## 2016-10-26 NOTE — H&P (View-Only) (Signed)
Patient ID: Jimmy Bradshaw, male   DOB: October 28, 1945, 72 y.o.   MRN: [id number]    Date:  10/19/2016   ID:  Jimmy Bradshaw, DOB 1945/10/01, MRN [id number]  PCP:  Vic Blackbird, MD  Primary Cardiologist:  New(Nahser)  Chief Complaint  Patient presents with  . Atrial Fibrillation   Problem list: 1. Paroxysmal atrial fibrillation  - CHADS2VASC score of 3  ( age, HTN, DM)  2. Hypertension 3. Hyperlipidemia 4. Diabetes mellitus 5. BPH - s/p TURP 6. S/p left TKA  - with supsequent infection of his prosthesis requiring re-do surgery .     Jimmy Bradshaw is a 71 y.o. male history of hypertension, hyperlipidemia, type 2 diabetes mellitus, anxiety, BPH. On 09/30/2014 he underwent TURP procedure and then a week later underwent irrigation and debridement of the left knee.  PICC line was placed and he was started on antibiotics for 4 weeks.  One of these surgeries patient supposedly went into atrial fibrillation with a rapid ventricular response. There are no EKGs or telemetry strips which confirmed this nor is there any documentation in the notes; either in the discharge summaries or with anesthesia.   The patient currently denies nausea, vomiting, fever, chest pain, shortness of breath, orthopnea, dizziness, PND, cough, congestion, abdominal pain, hematochezia, melena, lower extremity edema, claudication.   Feb. 19, 2016   He was getting his echo today and went in to rapid afib.  Still asymptomatic.  Was added on to my schedule.   November 21, 2014:  Jimmy Bradshaw is seen back today for follow up of his paroxysmal atrial fib.    We started him on Xarelto . Unfortunately, he had some bleeding in his urine .  He had just had recent prostate surgery ( [name]) .   He held his Xarelto for the next week.  He is doing better.   March 11, 2015: Jimmy Bradshaw has a hx of paroxysmal atrial fib. Has Eliquis but has not started it yet  His hematura has resolved.  Checks his pulse and has not had recurrent atrial fib to  his knowledge   Doing well.  Is having some leg pain.    Thinks it may be due to the pravachol  Still having knee problems .    Jan. 17, 2017:  Jimmy Bradshaw is doing well He has paroxysmal afib  - is back in Afib today - cannot tell that his HR is irregular.  No CP , no dyspnea.  He is not having any further urinary bleeding . His knee infection has resolved.   Jan. 30, 2018:  Jimmy Bradshaw is seen for visit  Dr. Buelah Manis increased the Toprol XL to 50 mg a day   Wt Readings from Last 3 Encounters:  10/19/16 226 lb 1.9 oz (102.6 kg)  10/18/16 228 lb (103.4 kg)  10/08/16 238 lb (108 kg)     Past Medical History:  Diagnosis Date  . Anxiety   . Arthritis    HANDS,  SHOULDERS  . Bladder neck obstruction   . BPH (benign prostatic hypertrophy)   . Frequency of urination   . H/O: gout   . History of colon polyps   . Hyperlipidemia   . Nocturia   . Type 2 diabetes mellitus ([place])   . Urgency of urination   . Wears hearing aid    BILATERAL    Current Outpatient Prescriptions  Medication Sig Dispense Refill  . apixaban (ELIQUIS) 5 MG TABS tablet Take 1 tablet (5 mg total) by  mouth 2 (two) times daily. 180 tablet 3  . lisinopril (PRINIVIL,ZESTRIL) 2.5 MG tablet Take 2.5 mg by mouth daily.    . metFORMIN (GLUCOPHAGE-XR) 500 MG 24 hr tablet Take 500 mg by mouth daily with breakfast.    . metoprolol succinate (TOPROL-XL) 25 MG 24 hr tablet Take 2 tablets (50 mg total) by mouth daily. *Please call and schedule a one year follow up appointment* 90 tablet 0  . rosuvastatin (CRESTOR) 10 MG tablet Take 1 tablet (10 mg total) by mouth daily. 90 tablet 3   No current facility-administered medications for this visit.     Allergies:    Allergies  Allergen Reactions  . Pravastatin Other (See Comments)    Headache and legs hurt  . Penicillins Rash    Social History:  The patient  reports that he quit smoking about 17 years ago. His smoking use included Cigarettes. He quit after 4.00 years of use.  He has never used smokeless tobacco. He reports that he drinks alcohol. He reports that he does not use drugs.   Family history:   Family History  Problem Relation Age of Onset  . Adopted: Yes  . Colon cancer Father     ROS:  Please see the history of present illness.  All other systems reviewed and negative.   PHYSICAL EXAM: VS:  BP 108/86   Pulse 91   Ht 5\' 11"  (1.803 m)   Wt 226 lb 1.9 oz (102.6 kg)   BMI 31.54 kg/m  obese, well developed, in no acute distress  HEENT: Pupils are equal round react to light accommodation extraocular movements are intact.  Neck: no JVD No cervical lymphadenopathy. Cardiac: jirreg. Irreg.  without murmurs rubs or gallops.  Lungs:  clear to auscultation bilaterally, no wheezing, rhonchi or rales  Abd: soft, nontender, positive bowel sounds all quadrants, no hepatosplenomegaly  Ext: no lower extremity edema.  2+ radial and dorsalis pedis pulses. Skin: warm and dry  Neuro:  Grossly normal  EKG:  Jan.  30, 2018  Atrial fib with rate of91    ASSESSMENT AND PLAN:  1. Paroxysmal Atrial fibrillation:  CHADS 2VASC score of 3 ( Age, HTN, DM)   His paroxysmal atrial ablation and has now become more persistent. I would like to start him on flecainide 100 mg twice a day. He will continue the Toprol-XL 50 mg a day area we will schedule him for a cardioversion later this week or the first of next week. We'll see him back for a stress Myoview study in approximately 2 weeks. He'll continue the same dose of Eliquis . I'll see him again in 3 months.  2. Essential hypertension: Patient's blood pressure is well-controlled. Continue current meds   3. Hyperlipidemia: Continue to follow.  Followed by primary.  He is on Crestor now.  Seems to be tolerating it well. Will check labs in 6 monhs   4.  Diabetes mellitus-followed by his general medical doctor.   Thayer Headings, [name], MD, [facility] 10/19/2016, 12:01 PM [address]  [place] Pager [phone]

## 2016-10-26 NOTE — Discharge Instructions (Signed)
Electrical Cardioversion, Care After °This sheet gives you information about how to care for yourself after your procedure. Your health care provider may also give you more specific instructions. If you have problems or questions, contact your health care provider. °What can I expect after the procedure? °After the procedure, it is common to have: °· Some redness on the skin where the shocks were given. °Follow these instructions at home: °· Do not drive for 24 hours if you were given a medicine to help you relax (sedative). °· Take over-the-counter and prescription medicines only as told by your health care provider. °· Ask your health care provider how to check your pulse. Check it often. °· Rest for 48 hours after the procedure or as told by your health care provider. °· Avoid or limit your caffeine use as told by your health care provider. °Contact a health care provider if: °· You feel like your heart is beating too quickly or your pulse is not regular. °· You have a serious muscle cramp that does not go away. °Get help right away if: °· You have discomfort in your chest. °· You are dizzy or you feel faint. °· You have trouble breathing or you are short of breath. °· Your speech is slurred. °· You have trouble moving an arm or leg on one side of your body. °· Your fingers or toes turn cold or blue. °This information is not intended to replace advice given to you by your health care provider. Make sure you discuss any questions you have with your health care provider. °Document Released: 06/27/2013 Document Revised: 04/09/2016 Document Reviewed: 03/12/2016 °Elsevier Interactive Patient Education © 2017 [employer] ° °

## 2016-10-28 ENCOUNTER — Telehealth (HOSPITAL_COMMUNITY): Payer: Self-pay | Admitting: *Deleted

## 2016-10-28 NOTE — Telephone Encounter (Signed)
Patient given detailed instructions per Myocardial Perfusion Study Information Sheet for the test on 11/02/16 Patient notified to arrive 15 minutes early and that it is imperative to arrive on time for appointment to keep from having the test rescheduled.  If you need to cancel or reschedule your appointment, please call the office within 24 hours of your appointment. Failure to do so may result in a cancellation of your appointment, and a $50 no show fee. Patient verbalized understanding. Evangelina Delancey Jacqueline    

## 2016-11-02 ENCOUNTER — Ambulatory Visit (HOSPITAL_COMMUNITY): Payer: Medicare HMO | Attending: Cardiology

## 2016-11-02 ENCOUNTER — Telehealth: Payer: Self-pay | Admitting: Nurse Practitioner

## 2016-11-02 DIAGNOSIS — I480 Paroxysmal atrial fibrillation: Secondary | ICD-10-CM | POA: Diagnosis not present

## 2016-11-02 DIAGNOSIS — R9439 Abnormal result of other cardiovascular function study: Secondary | ICD-10-CM | POA: Diagnosis not present

## 2016-11-02 LAB — MYOCARDIAL PERFUSION IMAGING
CHL CUP NUCLEAR SSS: 7
CHL CUP RESTING HR STRESS: 52 {beats}/min
CHL RATE OF PERCEIVED EXERTION: 18
CSEPED: 6 min
CSEPEDS: 0 s
CSEPHR: 88 %
Estimated workload: 7 METS
LV sys vol: 50 mL
LVDIAVOL: 98 mL (ref 62–150)
MPHR: 151 {beats}/min
Peak HR: 134 {beats}/min
RATE: 0.35
SDS: 1
SRS: 6
TID: 0.9

## 2016-11-02 MED ORDER — TECHNETIUM TC 99M TETROFOSMIN IV KIT
32.9000 | PACK | Freq: Once | INTRAVENOUS | Status: AC | PRN
  Administered 2016-11-02: 32.9 via INTRAVENOUS
  Filled 2016-11-02: qty 33

## 2016-11-02 MED ORDER — FLECAINIDE ACETATE 50 MG PO TABS: 50.0000 mg | ORAL_TABLET | Freq: Two times a day (BID) | ORAL | 3 refills | Status: DC

## 2016-11-02 MED ORDER — TECHNETIUM TC 99M TETROFOSMIN IV KIT
10.4000 | PACK | Freq: Once | INTRAVENOUS | Status: AC | PRN
  Administered 2016-11-02: 10.4 via INTRAVENOUS
  Filled 2016-11-02: qty 11

## 2016-11-02 NOTE — Telephone Encounter (Signed)
-----   Message from Thayer Headings, MD sent at 11/02/2016  5:39 PM EST ----- The images look ok . No ischemia The QRS widened with exercise - which is potentially dangerous ( suggestive of possible proarrhythmia)  Lets decrease Flecainide to 50 mg BID.  Will see him in 3 months .

## 2016-11-02 NOTE — Telephone Encounter (Signed)
Results reviewed with patient who verbalized understanding and agreement to decrease flecainide to 50 mg BID. I advised that his next Rx will be the 50 mg tablets and he states he will break his current pills in half until he picks up new Rx. I advised him to call back prior to April f/u appointment with questions or concerns. He thanked me for the call.

## 2016-11-03 ENCOUNTER — Encounter: Payer: Self-pay | Admitting: Family Medicine

## 2016-11-03 ENCOUNTER — Ambulatory Visit (INDEPENDENT_AMBULATORY_CARE_PROVIDER_SITE_OTHER): Payer: Medicare HMO | Admitting: Family Medicine

## 2016-11-03 VITALS — BP 128/78 | HR 60 | Temp 98.5°F | Resp 16 | Ht 71.0 in | Wt 234.0 lb

## 2016-11-03 DIAGNOSIS — K529 Noninfective gastroenteritis and colitis, unspecified: Secondary | ICD-10-CM | POA: Diagnosis not present

## 2016-11-03 NOTE — Patient Instructions (Addendum)
Viberzi is the IBS  Return the stool studies  F/U pending results

## 2016-11-03 NOTE — Progress Notes (Signed)
   Subjective:    Patient ID: Jimmy Bradshaw, male    DOB: 01-Apr-1946, 71 y.o.   MRN: [id number]  Patient presents for GI Issues (reports that he has to go to BR almost immediately after eating- states that BM are constistently loose ) Patient here with bowel movements after eating. States they're consistently loose.This has been present on and off for years. First thing in AM after cofee Has tried probiotics. Wife was concerned as she had some cardiac issues recently and WBC had been elevated  He's also had leukocytosis of unknown cause. His white blood cell count peaked at 15.5 his last check was done at 13.3 but his peripheral smear was unremarkable. We did ask her to repeatedly about any signs of infection and he denied  Had colonoscopy 2017- 1 polyp   Review Of Systems:  GEN- denies fatigue, fever, weight loss,weakness, recent illness HEENT- denies eye drainage, change in vision, nasal discharge, CVS- denies chest pain, palpitations RESP- denies SOB, cough, wheeze ABD- denies N/V, change in stools, abd pain GU- denies dysuria, hematuria, dribbling, incontinence MSK- denies joint pain, muscle aches, injury Neuro- denies headache, dizziness, syncope, seizure activity       Objective:    BP 128/78   Pulse 60   Temp 98.5 F (36.9 C) (Oral)   Resp 16   Ht 5\' 11"  (1.803 m)   Wt 234 lb (106.1 kg)   SpO2 96%   BMI 32.64 kg/m  GEN- NAD, alert and oriented x3 HEENT- PERRL, EOMI, non injected sclera, pink conjunctiva, MMM, oropharynx clear CVS- RRR, no murmur RESP-CTAB ABD-NABS,soft,NT,ND EXT- No edema Pulses- Radial, DP- 2+        Assessment & Plan:      Problem List Items Addressed This Visit    None    Visit Diagnoses    Chronic diarrhea    -  Primary   chronic problems, DD include IBS/inflamatory bowel but normal colonoscopy, no recent travel for parasites. Check stool cultures, FOBT, fecal lactoferrin, if benign results will try Viberzi   Relevant Orders   Stool culture   Fecal Occult Blood, Guaiac   Clostridium Difficile by PCR   Fecal Lactoferrin      Note: This dictation was prepared with Dragon dictation along with smaller phrase technology. Any transcriptional errors that result from this process are unintentional.

## 2016-11-04 ENCOUNTER — Other Ambulatory Visit: Payer: Medicare HMO

## 2016-11-04 DIAGNOSIS — K529 Noninfective gastroenteritis and colitis, unspecified: Secondary | ICD-10-CM | POA: Diagnosis not present

## 2016-11-04 NOTE — Addendum Note (Signed)
Addended by: Sheral Flow on: 11/04/2016 10:58 AM   Modules accepted: Orders

## 2016-11-05 LAB — CLOSTRIDIUM DIFFICILE BY PCR: CDIFFPCR: NOT DETECTED

## 2016-11-05 LAB — FECAL LACTOFERRIN, QUANT: LACTOFERRIN: NEGATIVE

## 2016-11-08 LAB — STOOL CULTURE

## 2016-11-09 ENCOUNTER — Other Ambulatory Visit: Payer: Self-pay | Admitting: *Deleted

## 2016-11-09 MED ORDER — ELUXADOLINE 75 MG PO TABS: 75.0000 mg | ORAL_TABLET | Freq: Two times a day (BID) | ORAL | 3 refills | Status: DC

## 2016-11-11 ENCOUNTER — Telehealth: Payer: Self-pay | Admitting: *Deleted

## 2016-11-11 NOTE — Telephone Encounter (Signed)
Received request from pharmacy for [place] on [address].   PA submitted.   Dx: K52.9- chronic noninfectious diarrhea.  PA approved.   PA Case [id number]   Pharmacy made aware.

## 2016-11-30 ENCOUNTER — Ambulatory Visit: Payer: Commercial Managed Care - HMO | Admitting: Cardiovascular Disease

## 2016-12-17 DIAGNOSIS — E119 Type 2 diabetes mellitus without complications: Secondary | ICD-10-CM | POA: Diagnosis not present

## 2016-12-17 LAB — HM DIABETES EYE EXAM

## 2016-12-20 ENCOUNTER — Encounter: Payer: Self-pay | Admitting: Family Medicine

## 2016-12-23 ENCOUNTER — Encounter: Payer: Self-pay | Admitting: Cardiovascular Disease

## 2016-12-26 ENCOUNTER — Other Ambulatory Visit: Payer: Self-pay | Admitting: Cardiovascular Disease

## 2016-12-30 ENCOUNTER — Telehealth: Payer: Self-pay | Admitting: Family Medicine

## 2016-12-30 NOTE — Telephone Encounter (Signed)
Last injection was 10/08/16

## 2016-12-30 NOTE — Telephone Encounter (Signed)
yes

## 2016-12-30 NOTE — Telephone Encounter (Signed)
PATIENT CALLING TO ASK IF HE CAN PLEASE GET A CORTIZONE SHOT IN HIS SHOULDER AGAIN? WOULD LIKE YOU TO CALL HIM BACK REGARDING THIS

## 2016-12-31 NOTE — Telephone Encounter (Signed)
Pt can have another injection - please call and schedule.

## 2017-01-03 ENCOUNTER — Encounter: Payer: Self-pay | Admitting: Family Medicine

## 2017-01-03 ENCOUNTER — Ambulatory Visit (INDEPENDENT_AMBULATORY_CARE_PROVIDER_SITE_OTHER): Payer: Medicare HMO | Admitting: Family Medicine

## 2017-01-03 VITALS — BP 126/74 | HR 66 | Temp 97.8°F | Resp 16 | Ht 71.0 in | Wt 235.0 lb

## 2017-01-03 DIAGNOSIS — G8929 Other chronic pain: Secondary | ICD-10-CM

## 2017-01-03 DIAGNOSIS — M25512 Pain in left shoulder: Secondary | ICD-10-CM

## 2017-01-03 NOTE — Progress Notes (Signed)
Subjective:    Patient ID: Jimmy Bradshaw, male    DOB: Jun 22, 1946, 71 y.o.   MRN: [id number]  HPI  07/29/16 Patient has a history of an injury to his left shoulder more than 10 years ago when he slipped and fell. Since that time, he has had intermittent pain in his left shoulder. Recently he developed constant pain with abduction greater than 80. He has severe pain with empty can testing and with Hawkins testing. There've been no new falls or injuries. He has pain and aching throbbing pain in his shoulder when he tries to sleep. He has difficult time lifting his arm above his head and has a difficult time coming his hair or putting on shirts. He has obvious symptoms of impingement syndrome along with tendinitis in his rotator cuff. 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  At that time, my plan was:  I suspect that the patient has a partial tear in his suprasternal not as with underlying impingement syndrome and bursitis in his left shoulder. We discussed this at length. Prior to performing MRI, the patient would like to try cortisone shot. Using sterile technique, I injected the left shoulder with 2 mL of lidocaine, 2 mL of Marcaine, and 2 mL of 40 mg per mL Kenalog. The patient tolerated the procedure well without complication  7/[birth year] Is here today for follow-up. He states the shot lasted a good 8 weeks but has slowly started to wear off and is now causing him significant pain again. He is requesting a repeat cortisone injection.  At that time, my plan was: We discussed the patient's options. I recommended an MRI to rule out a tear that could be correctable surgically. He would like to repeat cortisone injection at least one more time before proceeding with an MRI. If the pain returns, he agrees to the MRI at that point. 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 I'm not sure that he will be a candidate for surgical correction given the length of time since injury but I feel an MRI is warranted  prior to committing the patient to cortisone injections every 3 months. He would like to try one additional time. Using sterile technique, I injected the left shoulder with 2 mL of lidocaine, 2 mL of Marcaine, and 2 mL of 40 mg per mL Kenalog. He tolerated the procedure without complication  01/20/53 Patient is here today requesting a repeat cortisone injection. It has been 3 months since his last. Patient states that the shots helped him immensely. He is able to sleep better without pain. However after 3 months the pain becomes moderate to severe and is unable to lift his arm greater than 90. Last time we discussed an MRI of the shoulder. He is adamant that he would not want surgery at this point. We have not performed a basic x-ray and I suggested that we at least do that to rule out underlying skeletal abnormalities that may account for his problem. 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 He would be willing to proceed with an x-ray of the shoulder. However he would like to perform a cortisone injection today. I explained to the patient that repeat cortisone increases his risk of AVN of the shoulder joint. He is willing to proceed with the injection.   Past Medical History:  Diagnosis Date  . Anxiety   . Arthritis  HANDS,  SHOULDERS  . Bladder neck obstruction   . BPH (benign prostatic hypertrophy)   . Frequency of urination   . H/O: gout   . History of colon polyps   . Hyperlipidemia   . Nocturia   . Type 2 diabetes mellitus ([place])   . Urgency of urination   . Wears hearing aid    BILATERAL   Past Surgical History:  Procedure Laterality Date  . CARDIOVERSION N/A 10/26/2016   Procedure: CARDIOVERSION;  Surgeon: Larey Dresser, MD;  Location: [facility];  Service: Cardiovascular;  Laterality: N/A;  . COLONOSCOPY  08-30-2011  . I&D KNEE WITH POLY EXCHANGE Left 10/08/2014    Procedure: IRRIGATION AND DEBRIDEMENT LEFT UNI KNEE ARTHOPLASTY  WITH POLY EXCHANGE;  Surgeon: Mauri Pole, MD;  Location: [facility];  Service: Orthopedics;  Laterality: Left;  . KNEE ARTHROSCOPY Left 11/ 2013  . PARTIAL KNEE ARTHROPLASTY Left 02/05/2013   Procedure: LEFT KNEE MEDIAL UNICOMPARTMENTAL KNEE;  Surgeon: Mauri Pole, MD;  Location: [facility];  Service: Orthopedics;  Laterality: Left;  . TRANSURETHRAL RESECTION OF PROSTATE N/A 09/30/2014   Procedure: TRANSURETHRAL RESECTION OF THE PROSTATE WITH GYRUS INSTRUMENTS;  Surgeon: Bernestine Amass, MD;  [facility];  Service: Urology;  Laterality: N/A;   Current Outpatient Prescriptions on File Prior to Visit  Medication Sig Dispense Refill  . apixaban (ELIQUIS) 5 MG TABS tablet Take 1 tablet (5 mg total) by mouth 2 (two) times daily. 180 tablet 3  . flecainide (TAMBOCOR) 50 MG tablet Take 1 tablet (50 mg total) by mouth 2 (two) times daily. 180 tablet 3  . lisinopril (PRINIVIL,ZESTRIL) 2.5 MG tablet Take 2.5 mg by mouth daily.    . metFORMIN (GLUCOPHAGE-XR) 500 MG 24 hr tablet Take 500 mg by mouth daily with breakfast.    . metoprolol succinate (TOPROL-XL) 25 MG 24 hr tablet Take 1 tablet (25 mg total) by mouth daily. 90 tablet 0  . rosuvastatin (CRESTOR) 10 MG tablet Take 1 tablet (10 mg total) by mouth daily. 90 tablet 3   No current facility-administered medications on file prior to visit.    Allergies  Allergen Reactions  . Pravastatin Other (See Comments)    Headache and legs hurt  . Penicillins Rash   Social History   Social History  . Marital status: Married    Spouse name: N/A  . Number of children: N/A  . Years of education: N/A   Occupational History  . Not on file.   Social History Main Topics  . Smoking status: Former Smoker    Years: 4.00    Types: Cigarettes    Quit date: 12/08/1998  . Smokeless tobacco: Never Used  . Alcohol use Yes     Comment: RARE  . Drug use: No  . Sexual  activity: Not on file   Other Topics Concern  . Not on file   Social History Narrative  . No narrative on file      Review of Systems  All other systems reviewed and are negative.      Objective:   Physical Exam  Constitutional: He appears well-developed and well-nourished.  HENT:  Right Ear: External ear normal.  Left Ear: External ear normal.  Nose: Nose normal.  Mouth/Throat: Oropharynx is clear and moist. No oropharyngeal exudate.  Eyes: Conjunctivae are normal. Pupils are equal, round, and reactive to light.  Neck: Normal range of motion. Neck supple. No JVD present. No thyromegaly present.  Cardiovascular: Normal rate,  regular rhythm and normal heart sounds.   Pulmonary/Chest: Effort normal and breath sounds normal. No respiratory distress. He has no wheezes. He has no rales.  Abdominal: Soft. Bowel sounds are normal.  Lymphadenopathy:    He has no cervical adenopathy.  Skin: No rash noted. No erythema.  Vitals reviewed. see HPI       Assessment & Plan:  Chronic left shoulder pain - Plan: DG Shoulder Left I would like the patient to get a basic x-ray of his left shoulder. Meanwhile I injected the left shoulder with 2 mL of lidocaine, 2 mL of Marcaine, and 2 mL of 40 mg per mL Kenalog. Patient tolerated procedure well without complication.  Also recommended physical therapy exercises to be performed with the Theraband that the patient could do at home including internal and external rotation of the shoulder and abduction of the shoulder

## 2017-01-10 ENCOUNTER — Other Ambulatory Visit: Payer: Self-pay | Admitting: Family Medicine

## 2017-01-10 ENCOUNTER — Other Ambulatory Visit: Payer: Self-pay | Admitting: Cardiovascular Disease

## 2017-01-10 ENCOUNTER — Encounter: Payer: Self-pay | Admitting: Cardiovascular Disease

## 2017-01-10 ENCOUNTER — Ambulatory Visit
Admission: RE | Admit: 2017-01-10 | Discharge: 2017-01-10 | Disposition: A | Discharge status: Home / Self Care | Payer: Medicare HMO | Source: Ambulatory Visit | Attending: Family Medicine | Admitting: Family Medicine

## 2017-01-10 ENCOUNTER — Ambulatory Visit (INDEPENDENT_AMBULATORY_CARE_PROVIDER_SITE_OTHER): Payer: Medicare HMO | Admitting: Cardiovascular Disease

## 2017-01-10 VITALS — BP 120/78 | HR 64 | Ht 71.5 in | Wt 230.8 lb

## 2017-01-10 DIAGNOSIS — G8929 Other chronic pain: Secondary | ICD-10-CM

## 2017-01-10 DIAGNOSIS — I480 Paroxysmal atrial fibrillation: Secondary | ICD-10-CM | POA: Diagnosis not present

## 2017-01-10 DIAGNOSIS — M19012 Primary osteoarthritis, left shoulder: Secondary | ICD-10-CM | POA: Diagnosis not present

## 2017-01-10 DIAGNOSIS — M25512 Pain in left shoulder: Principal | ICD-10-CM

## 2017-01-10 NOTE — Patient Instructions (Signed)
Medication Instructions:  Your physician recommends that you continue on your current medications as directed. Please refer to the Current Medication list given to you today.   Labwork: None Ordered   Testing/Procedures: None Ordered   Follow-Up: Your physician wants you to follow-up in: 6 months with Dr. Nahser.  You will receive a reminder letter in the mail two months in advance. If you don't receive a letter, please call our office to schedule the follow-up appointment.   If you need a refill on your cardiac medications before your next appointment, please call your pharmacy.   Thank you for choosing [facility]! Ceceilia Cephus, RN [phone]    

## 2017-01-10 NOTE — Progress Notes (Signed)
Patient ID: Jimmy Bradshaw, male   DOB: Apr 26, 1946, 71 y.o.   MRN: [id number]    Date:  01/10/2017   ID:  Jimmy Bradshaw, DOB 1946/09/01, MRN [id number]  PCP:  Vic Blackbird, MD  Primary Cardiologist:  Jacquel Redditt  Chief Complaint  Patient presents with  . Atrial Fibrillation   Problem list: 1. Paroxysmal atrial fibrillation  - CHADS2VASC score of 3  ( age, HTN, DM)  2. Hypertension 3. Hyperlipidemia 4. Diabetes mellitus 5. BPH - s/p TURP 6. S/p left TKA  - with supsequent infection of his prosthesis requiring re-do surgery .     Jimmy Bradshaw is a 71 y.o. male history of hypertension, hyperlipidemia, type 2 diabetes mellitus, anxiety, BPH. On 09/30/2014 he underwent TURP procedure and then a week later underwent irrigation and debridement of the left knee.  PICC line was placed and he was started on antibiotics for 4 weeks.  One of these surgeries patient supposedly went into atrial fibrillation with a rapid ventricular response. There are no EKGs or telemetry strips which confirmed this nor is there any documentation in the notes; either in the discharge summaries or with anesthesia.   The patient currently denies nausea, vomiting, fever, chest pain, shortness of breath, orthopnea, dizziness, PND, cough, congestion, abdominal pain, hematochezia, melena, lower extremity edema, claudication.   Feb. 19, 2016   He was getting his echo today and went in to rapid afib.  Still asymptomatic.  Was added on to my schedule.   November 21, 2014:  Jimmy Bradshaw is seen back today for follow up of his paroxysmal atrial fib.    We started him on Xarelto . Unfortunately, he had some bleeding in his urine .  He had just had recent prostate surgery ( [name]) .   He held his Xarelto for the next week.  He is doing better.   March 11, 2015: Jimmy Bradshaw has a hx of paroxysmal atrial fib. Has Eliquis but has not started it yet  His hematura has resolved.  Checks his pulse and has not had recurrent atrial fib to his  knowledge   Doing well.  Is having some leg pain.    Thinks it may be due to the pravachol  Still having knee problems .    Jan. 17, 2017:  Jimmy Bradshaw is doing well He has paroxysmal afib  - is back in Afib today - cannot tell that his HR is irregular.  No CP , no dyspnea.  He is not having any further urinary bleeding . His knee infection has resolved.   Jan. 30, 2018:  Jimmy Bradshaw is seen for visit  Dr. Buelah Manis increased the Toprol XL to 50 mg a day   January 10, 2017:  Jimmy Bradshaw is seen today for follow up of his atrial fib.  He had a cardioversion Feb. 6, 2018: GXT  Feb. 13, 2018 showed mild widening of his QRS with peak exercise. We decreased his flecainide from 100 mg twice a day to 50 mg twice a day at that point. In on Flecainide 50 mg BID , metoprolol 25 mg a day and Eliquis  Feels well Left knee replacement is working well     Wt Readings from Last 3 Encounters:  01/10/17 230 lb 12.8 oz (104.7 kg)  01/03/17 235 lb (106.6 kg)  11/03/16 234 lb (106.1 kg)     Past Medical History:  Diagnosis Date  . Anxiety   . Arthritis    HANDS,  SHOULDERS  . Bladder neck obstruction   .  BPH (benign prostatic hypertrophy)   . Frequency of urination   . H/O: gout   . History of colon polyps   . Hyperlipidemia   . Nocturia   . Type 2 diabetes mellitus ([place])   . Urgency of urination   . Wears hearing aid    BILATERAL    Current Outpatient Prescriptions  Medication Sig Dispense Refill  . apixaban (ELIQUIS) 5 MG TABS tablet Take 1 tablet (5 mg total) by mouth 2 (two) times daily. 180 tablet 3  . flecainide (TAMBOCOR) 50 MG tablet Take 1 tablet (50 mg total) by mouth 2 (two) times daily. 180 tablet 3  . lisinopril (PRINIVIL,ZESTRIL) 2.5 MG tablet Take 2.5 mg by mouth daily.    . metFORMIN (GLUCOPHAGE-XR) 500 MG 24 hr tablet Take 500 mg by mouth daily with breakfast.    . metoprolol succinate (TOPROL-XL) 25 MG 24 hr tablet Take 1 tablet (25 mg total) by mouth daily. 90 tablet 0  .  rosuvastatin (CRESTOR) 10 MG tablet Take 1 tablet (10 mg total) by mouth daily. 90 tablet 3   No current facility-administered medications for this visit.     Allergies:    Allergies  Allergen Reactions  . Pravastatin Other (See Comments)    Headache and legs hurt  . Penicillins Rash    Social History:  The patient  reports that he quit smoking about 18 years ago. His smoking use included Cigarettes. He quit after 4.00 years of use. He has never used smokeless tobacco. He reports that he drinks alcohol. He reports that he does not use drugs.   Family history:   Family History  Problem Relation Age of Onset  . Adopted: Yes  . Colon cancer Father     ROS:  Please see the history of present illness.  All other systems reviewed and negative.   PHYSICAL EXAM: VS:  BP 120/78 (BP Location: Right Arm, Patient Position: Sitting, Cuff Size: Normal)   Pulse 64   Ht 5' 11.5" (1.816 m)   Wt 230 lb 12.8 oz (104.7 kg)   BMI 31.74 kg/m  obese, well developed, in no acute distress  HEENT: Pupils are equal round react to light accommodation extraocular movements are intact.  Neck: no JVD No cervical lymphadenopathy. Cardiac: jirreg. Irreg.  without murmurs rubs or gallops.  Lungs:  clear to auscultation bilaterally, no wheezing, rhonchi or rales  Abd: soft, nontender, positive bowel sounds all quadrants, no hepatosplenomegaly  Ext: no lower extremity edema.  2+ radial and dorsalis pedis pulses. Skin: warm and dry  Neuro:  Grossly normal  EKG:  Jan.  30, 2018  Atrial fib with rate of 91     ASSESSMENT AND PLAN:  1. Paroxysmal Atrial fibrillation:  CHADS 2VASC score of 3 ( Age, HTN, DM)   . He'll continue the same dose of Eliquis . I'll see him again in 3 months.  2. Essential hypertension: Patient's blood pressure is well-controlled. Continue current meds   3. Hyperlipidemia: Continue to follow.  Followed by primary.  He is on Crestor now.  Seems to be tolerating it well. Will check  labs in 6 monhs   4.  Diabetes mellitus-followed by his general medical doctor.   Jimmy Bradshaw, [name], MD, [facility] 01/10/2017, 10:04 AM [address]  [place] Pager [phone]

## 2017-01-11 ENCOUNTER — Encounter: Payer: Self-pay | Admitting: Family Medicine

## 2017-01-11 ENCOUNTER — Telehealth: Payer: Self-pay | Admitting: *Deleted

## 2017-01-11 NOTE — Telephone Encounter (Signed)
Eliquis samples placed at the front desk for patient until he receives his supply.

## 2017-01-11 NOTE — Telephone Encounter (Signed)
Request received for Eliquis 5mg ; pt is 71 years old, Wt-104.7kg, Crea-1.09 on 10/19/16, & last seen by Dr. Acie Fredrickson on 01/10/17. Will send in requested prescription to requested pharmacy.

## 2017-02-01 ENCOUNTER — Telehealth: Payer: Self-pay | Admitting: Family Medicine

## 2017-02-01 DIAGNOSIS — E1165 Type 2 diabetes mellitus with hyperglycemia: Secondary | ICD-10-CM

## 2017-02-01 NOTE — Telephone Encounter (Signed)
Patient needs a referral to a foot doctor for his diabetes, he has been seeing [facility], would like to go somewhere else because he does not care for the other doctor there.  [phone]

## 2017-02-01 NOTE — Telephone Encounter (Signed)
Referral from last year says patient refused ref to [facility] stating he already has a foot doctor??  No where in chart do I find who that is.  Patient has never been seen at [facility].  Also he has [facility] and no other Podiatrist I am aware of takes that in this area.  Have call into patient to discuss.

## 2017-02-02 NOTE — Telephone Encounter (Signed)
LMTCB

## 2017-02-02 NOTE — Telephone Encounter (Signed)
Pt came to office.  Said saw a Art therapist across form [facility].  I told him that is not [facility].  He agrees for me to resubmit referral th them for his diabetic foot care

## 2017-03-04 ENCOUNTER — Ambulatory Visit (INDEPENDENT_AMBULATORY_CARE_PROVIDER_SITE_OTHER): Payer: Medicare HMO | Admitting: Family Medicine

## 2017-03-04 ENCOUNTER — Encounter: Payer: Self-pay | Admitting: Family Medicine

## 2017-03-04 VITALS — BP 118/66 | HR 68 | Temp 98.1°F | Resp 14 | Ht 71.5 in | Wt 233.0 lb

## 2017-03-04 DIAGNOSIS — E1140 Type 2 diabetes mellitus with diabetic neuropathy, unspecified: Secondary | ICD-10-CM | POA: Diagnosis not present

## 2017-03-04 DIAGNOSIS — E1149 Type 2 diabetes mellitus with other diabetic neurological complication: Secondary | ICD-10-CM

## 2017-03-04 LAB — BASIC METABOLIC PANEL
BUN: 18 mg/dL (ref 7–25)
CALCIUM: 9.6 mg/dL (ref 8.6–10.3)
CHLORIDE: 105 mmol/L (ref 98–110)
CO2: 25 mmol/L (ref 20–31)
CREATININE: 1.12 mg/dL (ref 0.70–1.25)
Glucose, Bld: 128 mg/dL — ABNORMAL HIGH (ref 70–99)
Potassium: 4.7 mmol/L (ref 3.5–5.3)
Sodium: 141 mmol/L (ref 135–146)

## 2017-03-04 MED ORDER — METFORMIN HCL ER 500 MG PO TB24: ORAL_TABLET | ORAL | 1 refills | Status: DC

## 2017-03-04 MED ORDER — GABAPENTIN 100 MG PO CAPS: ORAL_CAPSULE | ORAL | 2 refills | Status: DC

## 2017-03-04 MED ORDER — LISINOPRIL 2.5 MG PO TABS: 2.5000 mg | ORAL_TABLET | Freq: Every day | ORAL | 1 refills | Status: DC

## 2017-03-04 NOTE — Progress Notes (Signed)
   Subjective:    Patient ID: Jimmy Bradshaw, male    DOB: December 20, 1945, 71 y.o.   MRN: [id number]  Patient presents for Neuropathy (would like to discuss gabapentin)  Patient here with progressive worsening foot pain. He's had tingling in numbness in both feet states the thing going on for quite some time but he does try to move through. Now he is getting more of a burning sensation. He also has some heel pain on the left side first thing in the morning but after he starts walking it is fine.  DM- does not check CBG regulary, last A1C 6.4% In Jan, taking metformin as prescribed  Review Of Systems:  GEN- denies fatigue, fever, weight loss,weakness, recent illness HEENT- denies eye drainage, change in vision, nasal discharge, CVS- denies chest pain, palpitations RESP- denies SOB, cough, wheeze ABD- denies N/V, change in stools, abd pain GU- denies dysuria, hematuria, dribbling, incontinence MSK- +joint pain, muscle aches, injury Neuro- denies headache, dizziness, syncope, seizure activity       Objective:    BP 118/66   Pulse 68   Temp 98.1 F (36.7 C) (Oral)   Resp 14   Ht 5' 11.5" (1.816 m)   Wt 233 lb (105.7 kg)   SpO2 98%   BMI 32.04 kg/m  GEN- NAD, alert and oriented x3 HEENT- PERRL, EOMI, non injected sclera, pink conjunctiva, MMM, oropharynx clear CVS- RRR, no murmur RESP-CTAB EXT- No edema Pulses- Radial, DP- 2+        Assessment & Plan:      Problem List Items Addressed This Visit    Type 2 diabetes mellitus with diabetic neuropathy, unspecified ([place]) - Primary    Concern for worsening neuropathy symptoms. We'll start him on gabapentin. With 100 mg and titrate up to 300 mg. On recheck his A1c's diabetes has been well controlled. He does not check his blood sugar on a regular basis. He will call in a month if he is not feeling any relief with the medication. With regards to his heel pain is possibly he may have a small heel spur his pain does however improved  with the use of his shoes with inserts so we will continue this at this time.      Relevant Medications   lisinopril (PRINIVIL,ZESTRIL) 2.5 MG tablet   metFORMIN (GLUCOPHAGE-XR) 500 MG 24 hr tablet   Other Relevant Orders   Basic metabolic panel   Hemoglobin A1c   HM DIABETES FOOT EXAM (Completed)   Diabetic neuropathy (HCC)   Relevant Medications   lisinopril (PRINIVIL,ZESTRIL) 2.5 MG tablet   metFORMIN (GLUCOPHAGE-XR) 500 MG 24 hr tablet      Note: This dictation was prepared with Dragon dictation along with smaller phrase technology. Any transcriptional errors that result from this process are unintentional.

## 2017-03-04 NOTE — Assessment & Plan Note (Signed)
Concern for worsening neuropathy symptoms. We'll start him on gabapentin. With 100 mg and titrate up to 300 mg. On recheck his A1c's diabetes has been well controlled. He does not check his blood sugar on a regular basis. He will call in a month if he is not feeling any relief with the medication. With regards to his heel pain is possibly he may have a small heel spur his pain does however improved with the use of his shoes with inserts so we will continue this at this time.

## 2017-03-04 NOTE — Patient Instructions (Addendum)
Try the gabapentin start with 100mg  for 2 weeks, then increase up by 1 tablet up  to 3 tablets at bedtime  Call in 1 month if not helping  F/U 4 months for Physical

## 2017-03-05 LAB — HEMOGLOBIN A1C
Hgb A1c MFr Bld: 6.2 % — ABNORMAL HIGH (ref <5.7)
Mean Plasma Glucose: 131 mg/dL

## 2017-03-08 ENCOUNTER — Encounter: Payer: Self-pay | Admitting: Family Medicine

## 2017-03-08 ENCOUNTER — Ambulatory Visit (INDEPENDENT_AMBULATORY_CARE_PROVIDER_SITE_OTHER): Payer: Medicare HMO | Admitting: Family Medicine

## 2017-03-08 VITALS — BP 100/64 | HR 72 | Temp 98.6°F | Resp 18 | Ht 71.5 in | Wt 233.0 lb

## 2017-03-08 DIAGNOSIS — M7592 Shoulder lesion, unspecified, left shoulder: Secondary | ICD-10-CM

## 2017-03-08 DIAGNOSIS — G8929 Other chronic pain: Secondary | ICD-10-CM

## 2017-03-08 DIAGNOSIS — M25512 Pain in left shoulder: Principal | ICD-10-CM

## 2017-03-08 NOTE — Progress Notes (Signed)
Subjective:    Patient ID: Jimmy Bradshaw, male    DOB: 10-08-1945, 71 y.o.   MRN: [id number]  HPI  07/29/16 Patient has a history of an injury to his left shoulder more than 10 years ago when he slipped and fell. Since that time, he has had intermittent pain in his left shoulder. Recently he developed constant pain with abduction greater than 80. He has severe pain with empty can testing and with Hawkins testing. There've been no new falls or injuries. He has pain and aching throbbing pain in his shoulder when he tries to sleep. He has difficult time lifting his arm above his head and has a difficult time coming his hair or putting on shirts. He has obvious symptoms of impingement syndrome along with tendinitis in his rotator cuff. 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  At that time, my plan was:  I suspect that the patient has a partial tear in his suprasternal not as with underlying impingement syndrome and bursitis in his left shoulder. We discussed this at length. Prior to performing MRI, the patient would like to try cortisone shot. Using sterile technique, I injected the left shoulder with 2 mL of lidocaine, 2 mL of Marcaine, and 2 mL of 40 mg per mL Kenalog. The patient tolerated the procedure well without complication  9/76/73 Is here today for follow-up. He states the shot lasted a good 8 weeks but has slowly started to wear off and is now causing him significant pain again. He is requesting a repeat cortisone injection.  At that time, my plan was: We discussed the patient's options. I recommended an MRI to rule out a tear that could be correctable surgically. He would like to repeat cortisone injection at least one more time before proceeding with an MRI. If the pain returns, he agrees to the MRI at that point. 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 I'm not sure that he will be a candidate for surgical correction given the length of time since injury but I feel an MRI is warranted  prior to committing the patient to cortisone injections every 3 months. He would like to try one additional time. Using sterile technique, I injected the left shoulder with 2 mL of lidocaine, 2 mL of Marcaine, and 2 mL of 40 mg per mL Kenalog. He tolerated the procedure without complication  01/07/36 Patient is here today requesting a repeat cortisone injection. It has been 3 months since his last. Patient states that the shots helped him immensely. He is able to sleep better without pain. However after 3 months the pain becomes moderate to severe and is unable to lift his arm greater than 90. Last time we discussed an MRI of the shoulder. He is adamant that he would not want surgery at this point. We have not performed a basic x-ray and I suggested that we at least do that to rule out underlying skeletal abnormalities that may account for his problem. 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 He would be willing to proceed with an x-ray of the shoulder. However he would like to perform a cortisone injection today. I explained to the patient that repeat cortisone increases his risk of AVN of the shoulder joint. He is willing to proceed with the injection.  At that time, my plan was: I would like the patient to get a basic  x-ray of his left shoulder. Meanwhile I injected the left shoulder with 2 mL of lidocaine, 2 mL of Marcaine, and 2 mL of 40 mg per mL Kenalog. Patient tolerated procedure well without complication.  Also recommended physical therapy exercises to be performed with the Theraband that the patient could do at home including internal and external rotation of the shoulder and abduction of the shoulder  03/08/17 Patient returns today requesting a repeat injection in his shoulder. It is only been 2 months. The pain returned after only one month. As long as his arm is in a completely  dependent position, he is in no pain. As soon as he tries to lift his left shoulder a raise his arm over his head, the pain intensifies. Past Medical History:  Diagnosis Date  . Anxiety   . Arthritis    HANDS,  SHOULDERS  . Bladder neck obstruction   . BPH (benign prostatic hypertrophy)   . Frequency of urination   . H/O: gout   . History of colon polyps   . Hyperlipidemia   . Nocturia   . Type 2 diabetes mellitus ([place])   . Urgency of urination   . Wears hearing aid    BILATERAL   Past Surgical History:  Procedure Laterality Date  . CARDIOVERSION N/A 10/26/2016   Procedure: CARDIOVERSION;  Surgeon: Larey Dresser, MD;  Location: [place];  Service: Cardiovascular;  Laterality: N/A;  . COLONOSCOPY  08-30-2011  . I&D KNEE WITH POLY EXCHANGE Left 10/08/2014   Procedure: IRRIGATION AND DEBRIDEMENT LEFT UNI KNEE ARTHOPLASTY  WITH POLY EXCHANGE;  Surgeon: Mauri Pole, MD;  Location: [facility];  Service: Orthopedics;  Laterality: Left;  . KNEE ARTHROSCOPY Left 11/ 2013  . PARTIAL KNEE ARTHROPLASTY Left 02/05/2013   Procedure: LEFT KNEE MEDIAL UNICOMPARTMENTAL KNEE;  Surgeon: Mauri Pole, MD;  Location: [facility];  Service: Orthopedics;  Laterality: Left;  . TRANSURETHRAL RESECTION OF PROSTATE N/A 09/30/2014   Procedure: TRANSURETHRAL RESECTION OF THE PROSTATE WITH GYRUS INSTRUMENTS;  Surgeon: Bernestine Amass, MD;  Location: [facility];  Service: Urology;  Laterality: N/A;   Current Outpatient Prescriptions on File Prior to Visit  Medication Sig Dispense Refill  . ELIQUIS 5 MG TABS tablet TAKE 1 TABLET TWICE DAILY 180 tablet 1  . flecainide (TAMBOCOR) 50 MG tablet Take 1 tablet (50 mg total) by mouth 2 (two) times daily. 180 tablet 3  . gabapentin (NEURONTIN) 100 MG capsule Take 1-3 capsules at bedtime for nerve pain 90 capsule 2  . lisinopril (PRINIVIL,ZESTRIL) 2.5 MG tablet Take 1 tablet (2.5 mg total) by mouth daily. 90 tablet 1  . metFORMIN (GLUCOPHAGE-XR) 500 MG 24 hr  tablet TAKE 1 TABLET EVERY DAY WITH BREAKFAST 90 tablet 1  . metoprolol succinate (TOPROL-XL) 25 MG 24 hr tablet TAKE 1 TABLET (25 MG TOTAL) BY MOUTH DAILY. 90 tablet 3  . rosuvastatin (CRESTOR) 10 MG tablet Take 1 tablet (10 mg total) by mouth daily. 90 tablet 3   No current facility-administered medications on file prior to visit.    Allergies  Allergen Reactions  . Pravastatin Other (See Comments)    Headache and legs hurt  . Penicillins Rash   Social History   Social History  . Marital status: Married    Spouse name: N/A  . Number of children: N/A  . Years of education: N/A   Occupational History  . Not on file.   Social History Main Topics  . Smoking status: Former Smoker  Years: 4.00    Types: Cigarettes    Quit date: 12/08/1998  . Smokeless tobacco: Never Used  . Alcohol use Yes     Comment: RARE  . Drug use: No  . Sexual activity: Not on file   Other Topics Concern  . Not on file   Social History Narrative  . No narrative on file      Review of Systems  All other systems reviewed and are negative.      Objective:   Physical Exam  Constitutional: He appears well-developed and well-nourished.  HENT:  Right Ear: External ear normal.  Left Ear: External ear normal.  Nose: Nose normal.  Mouth/Throat: Oropharynx is clear and moist. No oropharyngeal exudate.  Eyes: Conjunctivae are normal. Pupils are equal, round, and reactive to light.  Neck: Normal range of motion. Neck supple. No JVD present. No thyromegaly present.  Cardiovascular: Normal rate, regular rhythm and normal heart sounds.   Pulmonary/Chest: Effort normal and breath sounds normal. No respiratory distress. He has no wheezes. He has no rales.  Abdominal: Soft. Bowel sounds are normal.  Lymphadenopathy:    He has no cervical adenopathy.  Skin: No rash noted. No erythema.  Vitals reviewed. see HPI       Assessment & Plan:  Chronic left shoulder pain - Plan: MR Shoulder Left Wo  Contrast  Supraspinatus tendinitis, left - Plan: MR Shoulder Left Wo Contrast I will not charge the patient for an office visit.  I am unable to perform an injection because it has not been 3 months. Furthermore, I'll proceed with an MRI of the shoulder. I do not feel comfortable continuing to repeatedly inject the shoulder without determining the exact etiology of the problem. Perhaps the patient will be a candidate for surgical correction.  After I obtain MRI, I will consult [facility] orthopedics

## 2017-03-10 ENCOUNTER — Encounter: Payer: Self-pay | Admitting: *Deleted

## 2017-03-25 ENCOUNTER — Other Ambulatory Visit: Payer: Self-pay | Admitting: Family Medicine

## 2017-03-28 ENCOUNTER — Telehealth: Payer: Self-pay | Admitting: *Deleted

## 2017-03-28 NOTE — Telephone Encounter (Signed)
Pt has metal wire in head where he had surgery 55 years ago due to head injury playing football. Spoke with [facility] And they stated may have to do some skull x-rays when pt gets there to determine if they can do MRI. Let pt know.

## 2017-03-29 ENCOUNTER — Ambulatory Visit (HOSPITAL_COMMUNITY)
Admission: RE | Admit: 2017-03-29 | Discharge: 2017-03-29 | Disposition: A | Discharge status: Home / Self Care | Payer: Medicare HMO | Source: Ambulatory Visit | Attending: Family Medicine | Admitting: Family Medicine

## 2017-03-29 ENCOUNTER — Other Ambulatory Visit: Payer: Self-pay | Admitting: Family Medicine

## 2017-03-29 ENCOUNTER — Encounter: Payer: Self-pay | Admitting: Family Medicine

## 2017-03-29 DIAGNOSIS — M25512 Pain in left shoulder: Principal | ICD-10-CM

## 2017-03-29 DIAGNOSIS — M7592 Shoulder lesion, unspecified, left shoulder: Secondary | ICD-10-CM | POA: Insufficient documentation

## 2017-03-29 DIAGNOSIS — M19012 Primary osteoarthritis, left shoulder: Secondary | ICD-10-CM | POA: Insufficient documentation

## 2017-03-29 DIAGNOSIS — G8929 Other chronic pain: Secondary | ICD-10-CM

## 2017-03-29 DIAGNOSIS — Z8781 Personal history of (healed) traumatic fracture: Secondary | ICD-10-CM | POA: Diagnosis not present

## 2017-03-29 DIAGNOSIS — M7552 Bursitis of left shoulder: Secondary | ICD-10-CM | POA: Diagnosis not present

## 2017-03-29 DIAGNOSIS — M75122 Complete rotator cuff tear or rupture of left shoulder, not specified as traumatic: Secondary | ICD-10-CM | POA: Insufficient documentation

## 2017-03-29 DIAGNOSIS — Z01818 Encounter for other preprocedural examination: Secondary | ICD-10-CM | POA: Diagnosis not present

## 2017-03-30 ENCOUNTER — Other Ambulatory Visit: Payer: Self-pay | Admitting: *Deleted

## 2017-03-30 ENCOUNTER — Telehealth: Payer: Self-pay | Admitting: Family Medicine

## 2017-03-30 ENCOUNTER — Other Ambulatory Visit: Payer: Medicare HMO

## 2017-03-30 DIAGNOSIS — S46812A Strain of other muscles, fascia and tendons at shoulder and upper arm level, left arm, initial encounter: Principal | ICD-10-CM

## 2017-03-30 DIAGNOSIS — IMO0001 Reserved for inherently not codable concepts without codable children: Secondary | ICD-10-CM

## 2017-03-30 NOTE — Telephone Encounter (Signed)
Pt contacting us about referral to [facility], said dr. Dennard Schaumann had recommended a specific one there but does not remember his name please call him.

## 2017-03-31 ENCOUNTER — Telehealth: Payer: Self-pay | Admitting: Family Medicine

## 2017-03-31 NOTE — Telephone Encounter (Signed)
Dr. Netta Cedars

## 2017-03-31 NOTE — Telephone Encounter (Signed)
Pt aware and can we get him scheduled with Dr. Veverly Fells pleae

## 2017-03-31 NOTE — Telephone Encounter (Signed)
Pt got phone call from [facility], and he does not want to go there. He wants to go to [facility] to see dr. Veverly Fells per dr.pickard request. Please change and call him to let him know that this has been done.

## 2017-04-04 ENCOUNTER — Encounter: Payer: Self-pay | Admitting: Family Medicine

## 2017-04-04 NOTE — Telephone Encounter (Signed)
I have sent referral request to [facility] pending an appt. [facility] had cancelled the referral after they had contacted patient to schedule. I have reopened the referral further documentation will be in the referral.

## 2017-04-07 ENCOUNTER — Encounter: Payer: Self-pay | Admitting: Family Medicine

## 2017-04-07 ENCOUNTER — Ambulatory Visit (INDEPENDENT_AMBULATORY_CARE_PROVIDER_SITE_OTHER): Payer: Medicare HMO | Admitting: Family Medicine

## 2017-04-07 VITALS — BP 126/88 | HR 62 | Temp 98.0°F | Resp 16 | Ht 71.5 in | Wt 238.0 lb

## 2017-04-07 DIAGNOSIS — M75112 Incomplete rotator cuff tear or rupture of left shoulder, not specified as traumatic: Secondary | ICD-10-CM

## 2017-04-07 NOTE — Progress Notes (Signed)
Subjective:    Patient ID: Jimmy Bradshaw, male    DOB: 10-08-1945, 71 y.o.   MRN: [id number]  HPI  07/29/16 Patient has a history of an injury to his left shoulder more than 10 years ago when he slipped and fell. Since that time, he has had intermittent pain in his left shoulder. Recently he developed constant pain with abduction greater than 80. He has severe pain with empty can testing and with Hawkins testing. There've been no new falls or injuries. He has pain and aching throbbing pain in his shoulder when he tries to sleep. He has difficult time lifting his arm above his head and has a difficult time coming his hair or putting on shirts. He has obvious symptoms of impingement syndrome along with tendinitis in his rotator cuff. 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  At that time, my plan was:  I suspect that the patient has a partial tear in his suprasternal not as with underlying impingement syndrome and bursitis in his left shoulder. We discussed this at length. Prior to performing MRI, the patient would like to try cortisone shot. Using sterile technique, I injected the left shoulder with 2 mL of lidocaine, 2 mL of Marcaine, and 2 mL of 40 mg per mL Kenalog. The patient tolerated the procedure well without complication  9/76/73 Is here today for follow-up. He states the shot lasted a good 8 weeks but has slowly started to wear off and is now causing him significant pain again. He is requesting a repeat cortisone injection.  At that time, my plan was: We discussed the patient's options. I recommended an MRI to rule out a tear that could be correctable surgically. He would like to repeat cortisone injection at least one more time before proceeding with an MRI. If the pain returns, he agrees to the MRI at that point. 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 I'm not sure that he will be a candidate for surgical correction given the length of time since injury but I feel an MRI is warranted  prior to committing the patient to cortisone injections every 3 months. He would like to try one additional time. Using sterile technique, I injected the left shoulder with 2 mL of lidocaine, 2 mL of Marcaine, and 2 mL of 40 mg per mL Kenalog. He tolerated the procedure without complication  01/07/36 Patient is here today requesting a repeat cortisone injection. It has been 3 months since his last. Patient states that the shots helped him immensely. He is able to sleep better without pain. However after 3 months the pain becomes moderate to severe and is unable to lift his arm greater than 90. Last time we discussed an MRI of the shoulder. He is adamant that he would not want surgery at this point. We have not performed a basic x-ray and I suggested that we at least do that to rule out underlying skeletal abnormalities that may account for his problem. 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 He would be willing to proceed with an x-ray of the shoulder. However he would like to perform a cortisone injection today. I explained to the patient that repeat cortisone increases his risk of AVN of the shoulder joint. He is willing to proceed with the injection.  At that time, my plan was: I would like the patient to get a basic  x-ray of his left shoulder. Meanwhile I injected the left shoulder with 2 mL of lidocaine, 2 mL of Marcaine, and 2 mL of 40 mg per mL Kenalog. Patient tolerated procedure well without complication.  Also recommended physical therapy exercises to be performed with the Theraband that the patient could do at home including internal and external rotation of the shoulder and abduction of the shoulder  03/08/17 Patient returns today requesting a repeat injection in his shoulder. It is only been 2 months. The pain returned after only one month. As long as his arm is in a completely  dependent position, he is in no pain. As soon as he tries to lift his left shoulder a raise his arm over his head, the pain intensifies.  At that time, my plan was: I will not charge the patient for an office visit.  I am unable to perform an injection because it has not been 3 months. Furthermore, I'll proceed with an MRI of the shoulder. I do not feel comfortable continuing to repeatedly inject the shoulder without determining the exact etiology of the problem. Perhaps the patient will be a candidate for surgical correction.  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 There is a tear of the anterior and far lateral supraspinatus measuring 0.6 cm from front to back. The tear has both full-thickness and undersurface component. There is no tendon retraction. The rotator cuff is otherwise intact  I recommended ortho consult.    He will not be able to see them until August 8. Furthermore he is deferring surgery until the fall. Therefore he is hopeful that we will be able to perform a cortisone injection today to help with his pain. Past Medical History:  Diagnosis Date  . Anxiety   . Arthritis    HANDS,  SHOULDERS  . Bladder neck obstruction   . BPH (benign prostatic hypertrophy)   . Frequency of urination   . H/O: gout   . History of colon polyps   . Hyperlipidemia   . Nocturia   . Type 2 diabetes mellitus ([place])   . Urgency of urination   . Wears hearing aid    BILATERAL   Past Surgical History:  Procedure Laterality Date  . CARDIOVERSION N/A 10/26/2016   Procedure: CARDIOVERSION;  Surgeon: Larey Dresser, MD;  Location: [place];  Service: Cardiovascular;  Laterality: N/A;  . COLONOSCOPY  08-30-2011  . I&D KNEE WITH POLY EXCHANGE Left 10/08/2014   Procedure: IRRIGATION AND DEBRIDEMENT LEFT UNI KNEE ARTHOPLASTY  WITH POLY EXCHANGE;  Surgeon: Mauri Pole, MD;  Location: [facility];  Service: Orthopedics;  Laterality: Left;  . KNEE ARTHROSCOPY Left 11/ 2013  . PARTIAL KNEE ARTHROPLASTY Left 02/05/2013   Procedure: LEFT KNEE MEDIAL UNICOMPARTMENTAL KNEE;  Surgeon: Mauri Pole, MD;  Location: [facility];  Service: Orthopedics;  Laterality: Left;  . TRANSURETHRAL RESECTION OF PROSTATE N/A 09/30/2014   Procedure: TRANSURETHRAL RESECTION OF THE PROSTATE WITH GYRUS INSTRUMENTS;  Surgeon: Bernestine Amass, MD;  Location: [facility];  Service: Urology;  Laterality: N/A;   Current Outpatient Prescriptions on File Prior to Visit  Medication Sig Dispense Refill  . ELIQUIS 5 MG TABS tablet TAKE 1 TABLET TWICE DAILY 180 tablet 1  . flecainide (TAMBOCOR) 50 MG tablet Take 1 tablet (50 mg total) by mouth 2 (two) times daily. 180 tablet 3  .  gabapentin (NEURONTIN) 100 MG capsule Take 1-3 capsules at bedtime for nerve pain 90 capsule 2  . lisinopril (PRINIVIL,ZESTRIL) 2.5 MG tablet Take 1 tablet (2.5 mg total) by mouth daily. 90 tablet 1  . metFORMIN (GLUCOPHAGE-XR) 500 MG 24 hr tablet TAKE 1 TABLET EVERY DAY WITH BREAKFAST 90 tablet 1  . metoprolol succinate (TOPROL-XL) 25 MG 24 hr tablet TAKE 1 TABLET (25 MG TOTAL) BY MOUTH DAILY. 90 tablet 3  . rosuvastatin (CRESTOR) 10 MG tablet Take 1 tablet (10 mg total) by mouth daily. 90 tablet 3   No current facility-administered medications on file prior to visit.    Allergies  Allergen Reactions  . Pravastatin Other (See Comments)    Headache and legs hurt  . Penicillins Rash   Social History   Social History  . Marital status: Married    Spouse name: N/A  . Number of children: N/A  . Years of education: N/A   Occupational History  . Not on file.   Social History Main Topics  . Smoking status: Former Smoker    Years: 4.00    Types: Cigarettes    Quit date: 12/08/1998  . Smokeless tobacco: Never Used  . Alcohol use Yes     Comment: RARE  . Drug use: No  . Sexual activity: Not on file     Other Topics Concern  . Not on file   Social History Narrative  . No narrative on file      Review of Systems  All other systems reviewed and are negative.      Objective:   Physical Exam  Constitutional: He appears well-developed and well-nourished.  HENT:  Right Ear: External ear normal.  Left Ear: External ear normal.  Nose: Nose normal.  Mouth/Throat: Oropharynx is clear and moist. No oropharyngeal exudate.  Eyes: Pupils are equal, round, and reactive to light. Conjunctivae are normal.  Neck: Normal range of motion. Neck supple. No JVD present. No thyromegaly present.  Cardiovascular: Normal rate, regular rhythm and normal heart sounds.   Pulmonary/Chest: Effort normal and breath sounds normal. No respiratory distress. He has no wheezes. He has no rales.  Abdominal: Soft. Bowel sounds are normal.  Lymphadenopathy:    He has no cervical adenopathy.  Skin: No rash noted. No erythema.  Vitals reviewed. see HPI       Assessment & Plan:  Supraspinatus tendonosis/partial tear  Using sterile technique, I injected the left subacromial space with a mixture of 2 mL of lidocaine, 2 mL of Marcaine, and 2 mL of 40 mg per mL Kenalog. The patient tolerated the procedure well without complication

## 2017-04-08 ENCOUNTER — Other Ambulatory Visit: Payer: Self-pay | Admitting: Cardiovascular Disease

## 2017-04-08 ENCOUNTER — Other Ambulatory Visit: Payer: Self-pay | Admitting: Family Medicine

## 2017-04-20 ENCOUNTER — Ambulatory Visit (INDEPENDENT_AMBULATORY_CARE_PROVIDER_SITE_OTHER): Payer: Medicare HMO | Admitting: Podiatry

## 2017-04-20 ENCOUNTER — Ambulatory Visit (INDEPENDENT_AMBULATORY_CARE_PROVIDER_SITE_OTHER): Payer: Medicare HMO

## 2017-04-20 ENCOUNTER — Encounter: Payer: Self-pay | Admitting: Podiatry

## 2017-04-20 VITALS — BP 118/84 | HR 76 | Resp 18

## 2017-04-20 DIAGNOSIS — M722 Plantar fascial fibromatosis: Secondary | ICD-10-CM | POA: Diagnosis not present

## 2017-04-20 DIAGNOSIS — E1142 Type 2 diabetes mellitus with diabetic polyneuropathy: Secondary | ICD-10-CM

## 2017-04-20 DIAGNOSIS — M7732 Calcaneal spur, left foot: Secondary | ICD-10-CM

## 2017-04-20 NOTE — Patient Instructions (Signed)
Plantar Fasciitis Plantar fasciitis is a painful foot condition that affects the heel. It occurs when the band of tissue that connects the toes to the heel bone (plantar fascia) becomes irritated. This can happen after exercising too much or doing other repetitive activities (overuse injury). The pain from plantar fasciitis can range from mild irritation to severe pain that makes it difficult for you to walk or move. The pain is usually worse in the morning or after you have been sitting or lying down for a while. What are the causes? This condition may be caused by:  Standing for long periods of time.  Wearing shoes that do not fit.  Doing high-impact activities, including running, aerobics, and ballet.  Being overweight.  Having an abnormal way of walking (gait).  Having tight calf muscles.  Having high arches in your feet.  Starting a new athletic activity.  What are the signs or symptoms? The main symptom of this condition is heel pain. Other symptoms include:  Pain that gets worse after activity or exercise.  Pain that is worse in the morning or after resting.  Pain that goes away after you walk for a few minutes.  How is this diagnosed? This condition may be diagnosed based on your signs and symptoms. Your health care provider will also do a physical exam to check for:  A tender area on the bottom of your foot.  A high arch in your foot.  Pain when you move your foot.  Difficulty moving your foot.  You may also need to have imaging studies to confirm the diagnosis. These can include:  X-rays.  Ultrasound.  MRI.  How is this treated? Treatment for plantar fasciitis depends on the severity of the condition. Your treatment may include:  Rest, ice, and over-the-counter pain medicines to manage your pain.  Exercises to stretch your calves and your plantar fascia.  A splint that holds your foot in a stretched, upward position while you sleep (night  splint).  Physical therapy to relieve symptoms and prevent problems in the future.  Cortisone injections to relieve severe pain.  Extracorporeal shock wave therapy (ESWT) to stimulate damaged plantar fascia with electrical impulses. It is often used as a last resort before surgery.  Surgery, if other treatments have not worked after 12 months.  Follow these instructions at home:  Take medicines only as directed by your health care provider.  Avoid activities that cause pain.  Roll the bottom of your foot over a bag of ice or a bottle of cold water. Do this for 20 minutes, 3-4 times a day.  Perform simple stretches as directed by your health care provider.  Try wearing athletic shoes with air-sole or gel-sole cushions or soft shoe inserts.  Wear a night splint while sleeping, if directed by your health care provider.  Keep all follow-up appointments with your health care provider. How is this prevented?  Do not perform exercises or activities that cause heel pain.  Consider finding low-impact activities if you continue to have problems.  Lose weight if you need to. The best way to prevent plantar fasciitis is to avoid the activities that aggravate your plantar fascia. Contact a health care provider if:  Your symptoms do not go away after treatment with home care measures.  Your pain gets worse.  Your pain affects your ability to move or do your daily activities. This information is not intended to replace advice given to you by your health care provider. Make sure you   discuss any questions you have with your health care provider. Document Released: 06/01/2001 Document Revised: 02/09/2016 Document Reviewed: 07/17/2014 Elsevier Interactive Patient Education  2018 Elsevier Inc.  Diabetes and Foot Care Diabetes may cause you to have problems because of poor blood supply (circulation) to your feet and legs. This may cause the skin on your feet to become thinner, break easier,  and heal more slowly. Your skin may become dry, and the skin may peel and crack. You may also have nerve damage in your legs and feet causing decreased feeling in them. You may not notice minor injuries to your feet that could lead to infections or more serious problems. Taking care of your feet is one of the most important things you can do for yourself. Follow these instructions at home:  Wear shoes at all times, even in the house. Do not go barefoot. Bare feet are easily injured.  Check your feet daily for blisters, cuts, and redness. If you cannot see the bottom of your feet, use a mirror or ask someone for help.  Wash your feet with warm water (do not use hot water) and mild soap. Then pat your feet and the areas between your toes until they are completely dry. Do not soak your feet as this can dry your skin.  Apply a moisturizing lotion or petroleum jelly (that does not contain alcohol and is unscented) to the skin on your feet and to dry, brittle toenails. Do not apply lotion between your toes.  Trim your toenails straight across. Do not dig under them or around the cuticle. File the edges of your nails with an emery board or nail file.  Do not cut corns or calluses or try to remove them with medicine.  Wear clean socks or stockings every day. Make sure they are not too tight. Do not wear knee-high stockings since they may decrease blood flow to your legs.  Wear shoes that fit properly and have enough cushioning. To break in new shoes, wear them for just a few hours a day. This prevents you from injuring your feet. Always look in your shoes before you put them on to be sure there are no objects inside.  Do not cross your legs. This may decrease the blood flow to your feet.  If you find a minor scrape, cut, or break in the skin on your feet, keep it and the skin around it clean and dry. These areas may be cleansed with mild soap and water. Do not cleanse the area with peroxide, alcohol, or  iodine.  When you remove an adhesive bandage, be sure not to damage the skin around it.  If you have a wound, look at it several times a day to make sure it is healing.  Do not use heating pads or hot water bottles. They may burn your skin. If you have lost feeling in your feet or legs, you may not know it is happening until it is too late.  Make sure your health care provider performs a complete foot exam at least annually or more often if you have foot problems. Report any cuts, sores, or bruises to your health care provider immediately. Contact a health care provider if:  You have an injury that is not healing.  You have cuts or breaks in the skin.  You have an ingrown nail.  You notice redness on your legs or feet.  You feel burning or tingling in your legs or feet.  You have pain   or cramps in your legs and feet.  Your legs or feet are numb.  Your feet always feel cold. Get help right away if:  There is increasing redness, swelling, or pain in or around a wound.  There is a red line that goes up your leg.  Pus is coming from a wound.  You develop a fever or as directed by your health care provider.  You notice a bad smell coming from an ulcer or wound. This information is not intended to replace advice given to you by your health care provider. Make sure you discuss any questions you have with your health care provider. Document Released: 09/03/2000 Document Revised: 02/12/2016 Document Reviewed: 02/13/2013 Elsevier Interactive Patient Education  2017 Elsevier Inc.  

## 2017-04-20 NOTE — Progress Notes (Signed)
   Subjective:    Patient ID: Jimmy Bradshaw, male    DOB: 1946-02-15, 71 y.o.   MRN: [id number]  HPI This patient presents today with approximately 6 week history of a painful inferior left heel. Patient states that initially the pain was quite uncomfortable on initial standing walking, however, in the past 2 weeks his symptoms have significantly reduced. Patient is currently stretching and wearing a good feetorthotic on ongoing daily. Patient denies any direct injury or previous history of left inferior heel pain  Patient is diabetic and denies history of foot ulceration, claudication or amputation A shoe describes tingling burning and numbness  Patient former smoker discontinue naproxen 20 years prior  Review of Systems  Skin:       Left heel pain   All other systems reviewed and are negative.      Objective:   Physical Exam  Patient states that he is approximately 5 foot 11 inches and weighs approximately 230 pounds  Vascular: DP and PT pulses 2/4 bilaterally Capillary reflex delay bilaterally  Neurological: Sensation to 10 g monofilament wire intact 8/9 right and 9/9 left Vibratory sensation reactive bilaterally Ankle reflexes reactive bilaterally  Dermatological: No open skin lesions bilaterally Texture and turgor within normal limits bilaterally  Musculoskeletal: Hammertoe 2-5 bilaterally Palpable tenderness medial plantar insertional area plantar fascial left without any palpable lesions. This dictates patient's discomfort. The fat pad is adequate Patient has semirigid good feet orthotics in shoes     Weightbearing x-ray left foot dated 04/20/2017  Intact bony structure without any fracture and/or dislocation Posterior calcaneal spur with calcification within the distal tendo Achilles Metatarsus adductus Hammertoe second Bone density within normal limits Joint space within normal limits  Radiographic impression: No acute bony abnormality noted in  weightbearing x-ray of the left foot dated 04/20/2017      Assessment & Plan:   Assessment: Diabetic with satisfactory vascular status Protective sensation intact Diabetic peripheral neuropathy associated with symptoms of tingling burning and numbness Resolving plantar fasciitis left  Plan: Today I reviewed the results of the exam and x-ray with patient today. At this time patient's symptoms seem to have dramatically improved over the past 2 weeks. I instructed patient to continue stretching and continue wearing his over-the-counter foot supports. Patient returned if symptoms persist or worsened over time  Reappoint at patient's request or yearly for diabetic foot exam

## 2017-04-26 ENCOUNTER — Other Ambulatory Visit: Payer: Self-pay | Admitting: Cardiovascular Disease

## 2017-04-27 DIAGNOSIS — S46012A Strain of muscle(s) and tendon(s) of the rotator cuff of left shoulder, initial encounter: Secondary | ICD-10-CM | POA: Diagnosis not present

## 2017-05-10 DIAGNOSIS — S46012D Strain of muscle(s) and tendon(s) of the rotator cuff of left shoulder, subsequent encounter: Secondary | ICD-10-CM | POA: Diagnosis not present

## 2017-06-09 ENCOUNTER — Telehealth: Payer: Self-pay | Admitting: *Deleted

## 2017-06-09 NOTE — Telephone Encounter (Signed)
I dont recommend indocin as this interacts with this Eliquis and increases risk of bleeding He can have colchicine 0.6mg   Take 1.2mg  x 1 dose, then 1 tablet BID x 7 days  Other option  If colchine not covered is Prednisone taper, blood sugar will be high for a few days but will come down  40mg  x 3 days, then 20mg  x 3 days, 10mg  x 2 days

## 2017-06-09 NOTE — Telephone Encounter (Signed)
Received call from patient.   Requested refill on indocin for gout.   Medication is not on lit.   MD please advise.

## 2017-06-10 NOTE — Telephone Encounter (Signed)
Call placed to patient. LMTRC.  

## 2017-06-14 NOTE — Telephone Encounter (Signed)
Call placed to patient and patient made aware.   States that he will discuss with spouse and return call as to which prescription he would like filled.   Also inquired as to preventative medication for gout like allopurinol. MD please advise.

## 2017-06-14 NOTE — Telephone Encounter (Signed)
Have him schedule a visit, and I will check his uric acid level, I need to see this before starting something like allopurinol This message has been open for a few days now, so is he having pain or not from gout? Colchine may or may not be even covered we would need to run it through, but this is what I would recommend first, as he is diabetic, if it is not covered then he can use the Prednisone

## 2017-06-14 NOTE — Telephone Encounter (Signed)
Call placed to patient.   Patient states that he was having flare of gout on L outer ankle, but was taking Indocin prior to earlier conversation with Probation officer. State that he will not take anymore.   Appointment scheduled.

## 2017-06-14 NOTE — Telephone Encounter (Signed)
noted 

## 2017-06-15 ENCOUNTER — Encounter: Payer: Self-pay | Admitting: Family Medicine

## 2017-06-15 ENCOUNTER — Ambulatory Visit (INDEPENDENT_AMBULATORY_CARE_PROVIDER_SITE_OTHER): Payer: Medicare HMO | Admitting: Family Medicine

## 2017-06-15 VITALS — BP 122/72 | HR 60 | Temp 98.1°F | Resp 16 | Ht 71.5 in | Wt 232.0 lb

## 2017-06-15 DIAGNOSIS — E1140 Type 2 diabetes mellitus with diabetic neuropathy, unspecified: Secondary | ICD-10-CM

## 2017-06-15 DIAGNOSIS — M109 Gout, unspecified: Secondary | ICD-10-CM | POA: Diagnosis not present

## 2017-06-15 DIAGNOSIS — Z23 Encounter for immunization: Secondary | ICD-10-CM | POA: Diagnosis not present

## 2017-06-15 NOTE — Patient Instructions (Signed)
F/U 4 months for Physical  

## 2017-06-15 NOTE — Progress Notes (Signed)
   Subjective:    Patient ID: Jimmy Bradshaw, male    DOB: [birth year], 71 y.o.   MRN: [id number]  Patient presents for L Foot Pain (has been taking old prescription for indocin- states that he thinks it's gout)  Gout in left ankle, has had in past, was taking indocin Which he had an old prescription for until we called and told him that this interacted with his gallop was. States he been having heel pain which he was seen by the podiatrist for back in August he was told he had plantar fasciitis then he started having the ankle swelling and pain which felt like his previous gout. He states that the podiatrist told him it was not gout. The pain persisted and he had increased swelling. He decided to take the indomethacin and his pain went away. He was asking about allopurinol as he was on this years ago he does not recall why he stopped it. He is not having any significant pain at this time.  Diabetes mellitus he is taking his metformin he is due for recheck on his A1c did not bring his readings with him today    Review Of Systems:  GEN- denies fatigue, fever, weight loss,weakness, recent illness HEENT- denies eye drainage, change in vision, nasal discharge, CVS- denies chest pain, palpitations RESP- denies SOB, cough, wheeze ABD- denies N/V, change in stools, abd pain GU- denies dysuria, hematuria, dribbling, incontinence MSK-+ joint pain, muscle aches, injury Neuro- denies headache, dizziness, syncope, seizure activity       Objective:    BP 122/72   Pulse 60   Temp 98.1 F (36.7 C) (Oral)   Resp 16   Ht 5' 11.5" (1.816 m)   Wt 232 lb (105.2 kg)   SpO2 98%   BMI 31.91 kg/m  GEN- NAD, alert and oriented x3 HEENT- PERRL, EOMI, non injected sclera, pink conjunctiva, MMM, oropharynx clear CVS- RRR, no murmur RESP-CTAB EXT- No edema, Good ROM bilat ankle, no erythema Pulses- Radial, DP- 2+        Assessment & Plan:      Problem List Items Addressed This Visit      Unprioritized   Type 2 diabetes mellitus with diabetic neuropathy, unspecified (HCC) - Primary    Goals A1c less than 7% he is on metformin as well as ACE inhibitor Recheck his metabolic panel in his G2I today      Relevant Orders   Basic metabolic panel   Hemoglobin A1c   Gout flare    I'll check a uric acid level. I did not see his ankle while it was swollen but those seem to podiatrist solid early on. If his uric gas levels are still on the higher and he may benefit from taking a low-dose of allopurinol. Discussed with him why I want him to avoid the use of NSAIDs with his atrial fibrillation he is also on Eliquis and we needs also preserve kidney function.      Relevant Orders   Uric Acid      Note: This dictation was prepared with Dragon dictation along with smaller phrase technology. Any transcriptional errors that result from this process are unintentional.

## 2017-06-15 NOTE — Assessment & Plan Note (Signed)
I'll check a uric acid level. I did not see his ankle while it was swollen but those seem to podiatrist solid early on. If his uric gas levels are still on the higher and he may benefit from taking a low-dose of allopurinol. Discussed with him why I want him to avoid the use of NSAIDs with his atrial fibrillation he is also on Eliquis and we needs also preserve kidney function.

## 2017-06-15 NOTE — Assessment & Plan Note (Signed)
Goals A1c less than 7% he is on metformin as well as ACE inhibitor Recheck his metabolic panel in his G7M today

## 2017-06-16 LAB — BASIC METABOLIC PANEL
BUN: 20 mg/dL (ref 7–25)
CALCIUM: 9.2 mg/dL (ref 8.6–10.3)
CO2: 26 mmol/L (ref 20–32)
Chloride: 104 mmol/L (ref 98–110)
Creat: 1.05 mg/dL (ref 0.70–1.25)
GLUCOSE: 140 mg/dL — AB (ref 65–99)
POTASSIUM: 4.6 mmol/L (ref 3.5–5.3)
SODIUM: 140 mmol/L (ref 135–146)

## 2017-06-16 LAB — URIC ACID: URIC ACID, SERUM: 8.2 mg/dL — AB (ref 4.0–8.0)

## 2017-06-16 LAB — HEMOGLOBIN A1C
HEMOGLOBIN A1C: 6.1 %{Hb} — AB (ref <5.7)
MEAN PLASMA GLUCOSE: 128 (calc)
eAG (mmol/L): 7.1 (calc)

## 2017-06-21 ENCOUNTER — Other Ambulatory Visit: Payer: Self-pay | Admitting: *Deleted

## 2017-06-21 MED ORDER — ALLOPURINOL 100 MG PO TABS: 100.0000 mg | ORAL_TABLET | Freq: Every day | ORAL | 6 refills | Status: DC

## 2017-06-28 ENCOUNTER — Telehealth: Payer: Self-pay | Admitting: *Deleted

## 2017-06-28 MED ORDER — PREDNISONE 20 MG PO TABS: ORAL_TABLET | ORAL | 0 refills | Status: DC

## 2017-06-28 NOTE — Telephone Encounter (Signed)
Patient in office with concerns about his medications.   States that he was under the impression that he was to start the "treatment" medication for gout, and once flare was resolved he could begin the preventative (allopurionol).   States that he has not started allopurinol at this time because he continues to have flare of gout. States that he has not received treatment medication yet.   MD please advise.

## 2017-06-28 NOTE — Telephone Encounter (Signed)
Call placed to patient and patient made aware per VM.  

## 2017-06-28 NOTE — Telephone Encounter (Signed)
Allopurinol was sent to pharmacy on 10/2 and instructions were to start it ( see lab visit) He was not in any pain at the visit If he is in pain, send over prednisone taper but go AHEAD and START the allopurinol

## 2017-07-04 ENCOUNTER — Telehealth: Payer: Self-pay | Admitting: Family Medicine

## 2017-07-04 NOTE — Telephone Encounter (Signed)
[phone] Patient would like to speak to you only regarding his gout and clarifying a medication

## 2017-07-04 NOTE — Telephone Encounter (Signed)
Call placed to patient.   States that he does not want to take daily preventative (allopurinol) now. Reports that he would only like to treat attacks as they come. States that he needs something for recent attack. Reports that he has taken indocin over the weekend for pain.   Advised to pick up prednisone taper and stop indocin. Verbalized understanding.

## 2017-08-02 ENCOUNTER — Telehealth: Payer: Self-pay | Admitting: Cardiovascular Disease

## 2017-08-02 NOTE — Telephone Encounter (Signed)
Spoke with patient and he stated that he is in the donut hole and cannot afford to have the medication refilled. He is requesting enough samples to last him until January. I have made him aware that we are unable to provide him with that many samples as samples are intended for new starts or patients waiting for a response from patient assistance. I mentioned patient assistance to him and he is not interested in applying. He stated that he actually can afford the medication, he just does not want to buy it as he and his wife are on medications that are on either a tier 2 or 3. He went on to say that he will just not take the medication but two weeks will help. He is aware that samples will be placed at the front desk for him. Jeani Hawking Via, LPN placed a patient assistance application at the front desk along with the samples.

## 2017-08-02 NOTE — Telephone Encounter (Signed)
New Message ° °Patient calling the office for samples of medication: ° ° °1.  What medication and dosage are you requesting samples for? Eliquis 5mg  ° °2.  Are you currently out of this medication? yes ° ° ° °

## 2017-08-03 ENCOUNTER — Other Ambulatory Visit: Payer: Self-pay | Admitting: Family Medicine

## 2017-08-04 ENCOUNTER — Ambulatory Visit: Payer: Medicare HMO | Admitting: Family Medicine

## 2017-08-05 ENCOUNTER — Ambulatory Visit: Payer: Medicare HMO | Admitting: Family Medicine

## 2017-08-05 ENCOUNTER — Encounter: Payer: Self-pay | Admitting: Family Medicine

## 2017-08-05 VITALS — BP 110/74 | HR 62 | Temp 98.2°F | Resp 18 | Ht 71.5 in | Wt 228.0 lb

## 2017-08-05 DIAGNOSIS — M25512 Pain in left shoulder: Secondary | ICD-10-CM | POA: Diagnosis not present

## 2017-08-05 DIAGNOSIS — G8929 Other chronic pain: Secondary | ICD-10-CM

## 2017-08-05 NOTE — Progress Notes (Signed)
Subjective:    Patient ID: Jimmy Bradshaw, male    DOB: 10-08-1945, 71 y.o.   MRN: [id number]  HPI  07/29/16 Patient has a history of an injury to his left shoulder more than 10 years ago when he slipped and fell. Since that time, he has had intermittent pain in his left shoulder. Recently he developed constant pain with abduction greater than 80. He has severe pain with empty can testing and with Hawkins testing. There've been no new falls or injuries. He has pain and aching throbbing pain in his shoulder when he tries to sleep. He has difficult time lifting his arm above his head and has a difficult time coming his hair or putting on shirts. He has obvious symptoms of impingement syndrome along with tendinitis in his rotator cuff. 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  At that time, my plan was:  I suspect that the patient has a partial tear in his suprasternal not as with underlying impingement syndrome and bursitis in his left shoulder. We discussed this at length. Prior to performing MRI, the patient would like to try cortisone shot. Using sterile technique, I injected the left shoulder with 2 mL of lidocaine, 2 mL of Marcaine, and 2 mL of 40 mg per mL Kenalog. The patient tolerated the procedure well without complication  9/76/73 Is here today for follow-up. He states the shot lasted a good 8 weeks but has slowly started to wear off and is now causing him significant pain again. He is requesting a repeat cortisone injection.  At that time, my plan was: We discussed the patient's options. I recommended an MRI to rule out a tear that could be correctable surgically. He would like to repeat cortisone injection at least one more time before proceeding with an MRI. If the pain returns, he agrees to the MRI at that point. 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 I'm not sure that he will be a candidate for surgical correction given the length of time since injury but I feel an MRI is warranted  prior to committing the patient to cortisone injections every 3 months. He would like to try one additional time. Using sterile technique, I injected the left shoulder with 2 mL of lidocaine, 2 mL of Marcaine, and 2 mL of 40 mg per mL Kenalog. He tolerated the procedure without complication  01/07/36 Patient is here today requesting a repeat cortisone injection. It has been 3 months since his last. Patient states that the shots helped him immensely. He is able to sleep better without pain. However after 3 months the pain becomes moderate to severe and is unable to lift his arm greater than 90. Last time we discussed an MRI of the shoulder. He is adamant that he would not want surgery at this point. We have not performed a basic x-ray and I suggested that we at least do that to rule out underlying skeletal abnormalities that may account for his problem. 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 He would be willing to proceed with an x-ray of the shoulder. However he would like to perform a cortisone injection today. I explained to the patient that repeat cortisone increases his risk of AVN of the shoulder joint. He is willing to proceed with the injection.  At that time, my plan was: I would like the patient to get a basic  x-ray of his left shoulder. Meanwhile I injected the left shoulder with 2 mL of lidocaine, 2 mL of Marcaine, and 2 mL of 40 mg per mL Kenalog. Patient tolerated procedure well without complication.  Also recommended physical therapy exercises to be performed with the Theraband that the patient could do at home including internal and external rotation of the shoulder and abduction of the shoulder  03/08/17 Patient returns today requesting a repeat injection in his shoulder. It is only been 2 months. The pain returned after only one month. As long as his arm is in a completely  dependent position, he is in no pain. As soon as he tries to lift his left shoulder a raise his arm over his head, the pain intensifies.  At that time, my plan was: I will not charge the patient for an office visit.  I am unable to perform an injection because it has not been 3 months. Furthermore, I'll proceed with an MRI of the shoulder. I do not feel comfortable continuing to repeatedly inject the shoulder without determining the exact etiology of the problem. Perhaps the patient will be a candidate for surgical correction.  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 There is a tear of the anterior and far lateral supraspinatus measuring 0.6 cm from front to back. The tear has both full-thickness and undersurface component. There is no tendon retraction. The rotator cuff is otherwise intact  I recommended ortho consult.    He will not be able to see them until August 8. Furthermore he is deferring surgery until the fall. Therefore he is hopeful that we will be able to perform a cortisone injection today to help with his pain.  08/05/17 He has seen orthopedic surgery.  They have recommended physical therapy prior to any surgery.  Orthopedics also told the patient that he can receive a cortisone injection without causing further harm if necessary to control pain.  Patient is seen benefit from physical therapy however he continues to have severe pain in the shoulder particularly at night when he is trying to sleep and he would like a repeat cortisone injection today rather than having to pay a higher co-pay and see the orthopedist for the injection. Past Medical History:  Diagnosis Date  . Anxiety   . Arthritis    HANDS,  SHOULDERS  . Bladder neck obstruction   . BPH (benign prostatic hypertrophy)   . Frequency of urination   . H/O: gout   . History of colon polyps     . Hyperlipidemia   . Nocturia   . Type 2 diabetes mellitus ([place])   . Urgency of urination   . Wears hearing aid    BILATERAL   Past Surgical History:  Procedure Laterality Date  . CARDIOVERSION N/A 10/26/2016   Performed by Larey Dresser, MD at [facility]  . COLONOSCOPY  08-30-2011  . IRRIGATION AND DEBRIDEMENT LEFT UNI KNEE ARTHOPLASTY  WITH POLY EXCHANGE Left 10/08/2014   Performed by Mauri Pole, MD at [facility]  . KNEE ARTHROSCOPY Left 11/ 2013  . LEFT KNEE MEDIAL UNICOMPARTMENTAL KNEE Left 02/05/2013   Performed by Mauri Pole, MD at [facility] ORS  . TRANSURETHRAL RESECTION OF THE PROSTATE WITH GYRUS INSTRUMENTS N/A 09/30/2014   Performed by Bernestine Amass, MD at [facility]   Current Outpatient Medications on File Prior  to Visit  Medication Sig Dispense Refill  . ELIQUIS 5 MG TABS tablet TAKE 1 TABLET TWICE DAILY 180 tablet 1  . flecainide (TAMBOCOR) 50 MG tablet Take 1 tablet (50 mg total) by mouth 2 (two) times daily. 180 tablet 3  . gabapentin (NEURONTIN) 100 MG capsule TAKE 1 TO 3 CAPSULES AT BEDTIME FOR NERVE PAIN 90 capsule 2  . lisinopril (PRINIVIL,ZESTRIL) 2.5 MG tablet Take 1 tablet (2.5 mg total) by mouth daily. 90 tablet 1  . metFORMIN (GLUCOPHAGE-XR) 500 MG 24 hr tablet TAKE 1 TABLET EVERY DAY WITH BREAKFAST 90 tablet 1  . metoprolol succinate (TOPROL-XL) 25 MG 24 hr tablet TAKE 1 TABLET (25 MG TOTAL) BY MOUTH DAILY. 90 tablet 3  . rosuvastatin (CRESTOR) 10 MG tablet TAKE 1 TABLET EVERY DAY 90 tablet 3   No current facility-administered medications on file prior to visit.    Allergies  Allergen Reactions  . Pravastatin Other (See Comments)    Headache and legs hurt  . Penicillins Rash   Social History   Socioeconomic History  . Marital status: Married    Spouse name: Not on file  . Number of children: Not on file  . Years of education: Not on file  . Highest education level: Not on file  Social Needs  . Financial resource strain: Not on  file  . Food insecurity - worry: Not on file  . Food insecurity - inability: Not on file  . Transportation needs - medical: Not on file  . Transportation needs - non-medical: Not on file  Occupational History  . Not on file  Tobacco Use  . Smoking status: Former Smoker    Years: 4.00    Types: Cigarettes    Last attempt to quit: 12/08/1998    Years since quitting: 18.6  . Smokeless tobacco: Never Used  Substance and Sexual Activity  . Alcohol use: Yes    Comment: RARE  . Drug use: No  . Sexual activity: Not on file  Other Topics Concern  . Not on file  Social History Narrative  . Not on file      Review of Systems  All other systems reviewed and are negative.      Objective:   Physical Exam  Constitutional: He appears well-developed and well-nourished.  Eyes: Conjunctivae are normal. Pupils are equal, round, and reactive to light.  Cardiovascular: Normal rate, regular rhythm and normal heart sounds.  Pulmonary/Chest: Effort normal and breath sounds normal. No respiratory distress. He has no wheezes. He has no rales.  Musculoskeletal:       Left shoulder: He exhibits decreased range of motion, tenderness, pain and decreased strength.  Vitals reviewed. Internal rotation has improved.  External rotation has improved slightly.  Continues to have pain with abduction greater than 100 degrees and continues to have aching and throbbing pain in the shoulder at night when he tries to sleep       Assessment & Plan:  Chronic left shoulder pain  Using sterile technique, I injected the left subacromial space with a mixture of 2 mL of lidocaine, 2 mL of Marcaine, and 2 mL of 40 mg per mL Kenalog. The patient tolerated the procedure well without complication.  Patient will continue physical therapy and follow-up as planned with orthopedics.

## 2017-08-19 ENCOUNTER — Telehealth: Payer: Self-pay | Admitting: Family Medicine

## 2017-08-19 DIAGNOSIS — H547 Unspecified visual loss: Secondary | ICD-10-CM

## 2017-08-19 NOTE — Telephone Encounter (Signed)
Pt has appt with Dr Patrice Paradise at [facility] on 08/30/17.  Wants [place] to send referral.  Thinks he has cataracts

## 2017-08-31 DIAGNOSIS — H2513 Age-related nuclear cataract, bilateral: Secondary | ICD-10-CM | POA: Diagnosis not present

## 2017-08-31 DIAGNOSIS — H11132 Conjunctival pigmentations, left eye: Secondary | ICD-10-CM | POA: Diagnosis not present

## 2017-08-31 DIAGNOSIS — H01024 Squamous blepharitis left upper eyelid: Secondary | ICD-10-CM | POA: Diagnosis not present

## 2017-08-31 DIAGNOSIS — H01021 Squamous blepharitis right upper eyelid: Secondary | ICD-10-CM | POA: Diagnosis not present

## 2017-08-31 DIAGNOSIS — H35353 Cystoid macular degeneration, bilateral: Secondary | ICD-10-CM | POA: Diagnosis not present

## 2017-08-31 DIAGNOSIS — E113293 Type 2 diabetes mellitus with mild nonproliferative diabetic retinopathy without macular edema, bilateral: Secondary | ICD-10-CM | POA: Diagnosis not present

## 2017-08-31 DIAGNOSIS — H01025 Squamous blepharitis left lower eyelid: Secondary | ICD-10-CM | POA: Diagnosis not present

## 2017-08-31 DIAGNOSIS — H01022 Squamous blepharitis right lower eyelid: Secondary | ICD-10-CM | POA: Diagnosis not present

## 2017-08-31 DIAGNOSIS — H35373 Puckering of macula, bilateral: Secondary | ICD-10-CM | POA: Diagnosis not present

## 2017-09-01 DIAGNOSIS — H35373 Puckering of macula, bilateral: Secondary | ICD-10-CM | POA: Diagnosis not present

## 2017-09-01 DIAGNOSIS — H35371 Puckering of macula, right eye: Secondary | ICD-10-CM | POA: Diagnosis not present

## 2017-09-01 DIAGNOSIS — H35372 Puckering of macula, left eye: Secondary | ICD-10-CM | POA: Diagnosis not present

## 2017-09-01 DIAGNOSIS — D3132 Benign neoplasm of left choroid: Secondary | ICD-10-CM | POA: Diagnosis not present

## 2017-09-01 DIAGNOSIS — E119 Type 2 diabetes mellitus without complications: Secondary | ICD-10-CM | POA: Diagnosis not present

## 2017-09-07 ENCOUNTER — Encounter: Payer: Self-pay | Admitting: Cardiovascular Disease

## 2017-09-07 ENCOUNTER — Ambulatory Visit: Payer: Medicare HMO | Admitting: Cardiovascular Disease

## 2017-09-07 VITALS — BP 120/82 | HR 62 | Ht 71.0 in | Wt 223.8 lb

## 2017-09-07 DIAGNOSIS — I10 Essential (primary) hypertension: Secondary | ICD-10-CM

## 2017-09-07 DIAGNOSIS — I480 Paroxysmal atrial fibrillation: Secondary | ICD-10-CM | POA: Diagnosis not present

## 2017-09-07 NOTE — Patient Instructions (Signed)
Medication Instructions:  1. Your physician recommends that you continue on your current medications as directed. Please refer to the Current Medication list given to you today.   Labwork: NONE ORDERED TODAY  Testing/Procedures: NONE ORDERED TODAY  Follow-Up: Your physician wants you to follow-up in: 6 MONTHS WITH DR. NAHSER You will receive a reminder letter in the mail two months in advance. If you don't receive a letter, please call our office to schedule the follow-up appointment.   Any Other Special Instructions Will Be Listed Below (If Applicable).     If you need a refill on your cardiac medications before your next appointment, please call your pharmacy.   

## 2017-09-07 NOTE — Progress Notes (Signed)
Patient ID: NDREW Bradshaw, male   DOB: 17-Aug-1946, 71 y.o.   MRN: [id number]    Date:  09/07/2017   ID:  FLEM ENDERLE, DOB 1946-09-08, MRN [id number]  PCP:  Alycia Rossetti, MD  Primary Cardiologist:  Aamilah Augenstein  Chief Complaint  Patient presents with  . Atrial Fibrillation   Problem list: 1. Paroxysmal atrial fibrillation  - CHADS2VASC score of 3  ( age, HTN, DM)  2. Hypertension 3. Hyperlipidemia 4. Diabetes mellitus 5. BPH - s/p TURP 6. S/p left TKA  - with supsequent infection of his prosthesis requiring re-do surgery .     Jimmy Bradshaw is a 71 y.o. male history of hypertension, hyperlipidemia, type 2 diabetes mellitus, anxiety, BPH. On 09/30/2014 he underwent TURP procedure and then a week later underwent irrigation and debridement of the left knee.  PICC line was placed and he was started on antibiotics for 4 weeks.  One of these surgeries patient supposedly went into atrial fibrillation with a rapid ventricular response. There are no EKGs or telemetry strips which confirmed this nor is there any documentation in the notes; either in the discharge summaries or with anesthesia.   The patient currently denies nausea, vomiting, fever, chest pain, shortness of breath, orthopnea, dizziness, PND, cough, congestion, abdominal pain, hematochezia, melena, lower extremity edema, claudication.   Feb. 19, 2016   He was getting his echo today and went in to rapid afib.  Still asymptomatic.  Was added on to my schedule.   November 21, 2014:  Jimmy Bradshaw is seen back today for follow up of his paroxysmal atrial fib.    We started him on Xarelto . Unfortunately, he had some bleeding in his urine .  He had just had recent prostate surgery ( [name]) .   He held his Xarelto for the next week.  He is doing better.   March 11, 2015: Jimmy Bradshaw has a hx of paroxysmal atrial fib. Has Eliquis but has not started it yet  His hematura has resolved.  Checks his pulse and has not had recurrent atrial fib to his  knowledge   Doing well.  Is having some leg pain.    Thinks it may be due to the pravachol  Still having knee problems .    Jan. 17, 2017:  Jimmy Bradshaw is doing well He has paroxysmal afib  - is back in Afib today - cannot tell that his HR is irregular.  No CP , no dyspnea.  He is not having any further urinary bleeding . His knee infection has resolved.   Jan. 30, 2018:  Jimmy Bradshaw is seen for visit  Dr. Buelah Manis increased the Toprol XL to 50 mg a day   January 10, 2017:  Jimmy Bradshaw is seen today for follow up of his atrial fib.  He had a cardioversion Feb. 6, 2018: GXT  Feb. 13, 2018 showed mild widening of his QRS with peak exercise. We decreased his flecainide from 100 mg twice a day to 50 mg twice a day at that point. In on Flecainide 50 mg BID , metoprolol 25 mg a day and Eliquis  Feels well Left knee replacement is working well   Dec. 19, 2018:  No CP or dysnea.   Maintaining  NSR  Is watching his diet.   Walks with dogs.    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  Having some vision issues.   Saw Dr. Sherrilyn Rist ( at [facility]) Being referred to [facility] for furhter eval   Wt  Readings from Last 3 Encounters:  09/07/17 223 lb 12.8 oz (101.5 kg)  08/05/17 228 lb (103.4 kg)  06/15/17 232 lb (105.2 kg)     Past Medical History:  Diagnosis Date  . Anxiety   . Arthritis    HANDS,  SHOULDERS  . Bladder neck obstruction   . BPH (benign prostatic hypertrophy)   . Frequency of urination   . H/O: gout   . History of colon polyps   . Hyperlipidemia   . Nocturia   . Type 2 diabetes mellitus ([place])   . Urgency of urination   . Wears hearing aid    BILATERAL    Current Outpatient Medications  Medication Sig Dispense Refill  . ELIQUIS 5 MG TABS tablet TAKE 1 TABLET TWICE DAILY 180 tablet 1  . flecainide (TAMBOCOR) 50 MG tablet Take 1 tablet (50 mg total) by mouth 2 (two) times daily. 180 tablet 3  . gabapentin (NEURONTIN) 100 MG capsule TAKE 1 TO 3 CAPSULES AT BEDTIME FOR NERVE PAIN 90 capsule 2    . lisinopril (PRINIVIL,ZESTRIL) 2.5 MG tablet Take 1 tablet (2.5 mg total) by mouth daily. 90 tablet 1  . metFORMIN (GLUCOPHAGE-XR) 500 MG 24 hr tablet TAKE 1 TABLET EVERY DAY WITH BREAKFAST 90 tablet 1  . metoprolol succinate (TOPROL-XL) 25 MG 24 hr tablet TAKE 1 TABLET (25 MG TOTAL) BY MOUTH DAILY. 90 tablet 3  . rosuvastatin (CRESTOR) 10 MG tablet TAKE 1 TABLET EVERY DAY 90 tablet 3   No current facility-administered medications for this visit.     Allergies:    Allergies  Allergen Reactions  . Pravastatin Other (See Comments)    Headache and legs hurt  . Penicillins Rash    Social History:  The patient  reports that he quit smoking about 18 years ago. His smoking use included cigarettes. He quit after 4.00 years of use. he has never used smokeless tobacco. He reports that he drinks alcohol. He reports that he does not use drugs.   Family history:   Family History  Adopted: Yes  Problem Relation Age of Onset  . Colon cancer Father     ROS:  Please see the history of present illness.  All other systems reviewed and negative.   EKG:      ASSESSMENT AND PLAN:  1. Paroxysmal Atrial fibrillation:  CHADS 2VASC score of 3 ( Age, HTN, DM)   .  is maintaining NSR  Continue Eliquis   2. Essential hypertension:   - BP is well controlled.   3. Hyperlipidemia:  Managed by primary   4.  Diabetes mellitus-    Ramond Dial., MD, [facility] 09/07/2017, 10:56 AM [address]  [place] Pager [phone]

## 2017-09-24 ENCOUNTER — Other Ambulatory Visit: Payer: Self-pay | Admitting: Cardiovascular Disease

## 2017-09-26 NOTE — Telephone Encounter (Signed)
Eliquis 5mg  refill request received, pt is 72 yrs old, wt- 101.5kg, Crea-1.05 on 06/15/17, last seen by Dr. Acie Fredrickson on 09/07/17; refill sent to requested pharmacy.

## 2017-10-04 DIAGNOSIS — D487 Neoplasm of uncertain behavior of other specified sites: Secondary | ICD-10-CM | POA: Diagnosis not present

## 2017-10-11 ENCOUNTER — Other Ambulatory Visit: Payer: Self-pay

## 2017-10-11 ENCOUNTER — Ambulatory Visit (INDEPENDENT_AMBULATORY_CARE_PROVIDER_SITE_OTHER): Payer: Medicare HMO | Admitting: Family Medicine

## 2017-10-11 ENCOUNTER — Encounter: Payer: Self-pay | Admitting: Family Medicine

## 2017-10-11 VITALS — BP 128/74 | HR 62 | Temp 98.6°F | Resp 16 | Ht 71.0 in | Wt 222.0 lb

## 2017-10-11 DIAGNOSIS — R1013 Epigastric pain: Secondary | ICD-10-CM | POA: Diagnosis not present

## 2017-10-11 DIAGNOSIS — Z125 Encounter for screening for malignant neoplasm of prostate: Secondary | ICD-10-CM | POA: Diagnosis not present

## 2017-10-11 DIAGNOSIS — R29818 Other symptoms and signs involving the nervous system: Secondary | ICD-10-CM | POA: Diagnosis not present

## 2017-10-11 DIAGNOSIS — R634 Abnormal weight loss: Secondary | ICD-10-CM | POA: Diagnosis not present

## 2017-10-11 DIAGNOSIS — R6881 Early satiety: Secondary | ICD-10-CM

## 2017-10-11 DIAGNOSIS — E1140 Type 2 diabetes mellitus with diabetic neuropathy, unspecified: Secondary | ICD-10-CM | POA: Diagnosis not present

## 2017-10-11 DIAGNOSIS — H579 Unspecified disorder of eye and adnexa: Secondary | ICD-10-CM | POA: Diagnosis not present

## 2017-10-11 DIAGNOSIS — H547 Unspecified visual loss: Secondary | ICD-10-CM

## 2017-10-11 LAB — LIPASE: LIPASE: 36 U/L (ref 7–60)

## 2017-10-11 NOTE — Patient Instructions (Addendum)
CT abdomen scan to be done  MRI on Brain  We will call with lab results  F/U pending results

## 2017-10-11 NOTE — Assessment & Plan Note (Addendum)
Symptoms are very concerning.  He has pigmented lesions concerning for possible melanoma though also could be benign tumor.  Concerning is that his vision has decreased significantly in that eye as well as the sharp pains that he has been getting on the right side of his head.  I am going to obtain an MRI of his brain.  We will follow-up with [facility] pending the results. 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  He does have history of colon polyps with no colon cancer.  No history of prostate cancer.  With his age and significant weight loss I am ordering labs including thyroid PSA and A1c to follow-up his diabetes.  His early satiety and abdominal discomfort will be evaluated with a lipase and a CT of abdomen. R/U Gastric tumor, pancreatitic, liver lesions, bowel lesions  He does take Tums on occasion as well as Nexium advised him to go ahead and take the Nexium daily as this will help coat his stomach.

## 2017-10-11 NOTE — Progress Notes (Signed)
Subjective:    Patient ID: Jimmy Bradshaw, male    DOB: 01/14/1946, 72 y.o.   MRN: [id number]  Patient presents for Follow-up (check up d/t weight loss) and Eye Exam (has cataracts, Brown lesions, possibe tumor in eye)  Pt here to f/u, he has DM, PAF, last A1C  6.1% in Sept currently on Metformin 500mg  XR daily   On Eliquis, toprol for flecanide for the A fib   Seen by ophthalmology at [facility] care due to halos and glares,- he had brown  Pigmented spots on left  Eye that were concerning.  Concern for Primary Acquired Melanosis vs Melanoma at [facility] last week. He has f/u with a retinal specialist as well. They are holding on cataract surgery at this time His vision has decreased significantly in the left eye he is also had these intermittent sharp pains behind the eye and on the head that only lasts a few seconds.  He states that he did not do any imaging of his brain.  Follow-up with the specialist at [facility] in 3 months.  Weight down 10lbs since Sept 2018, unintentional weight loss, the past few months.  He states that when he eats he gets full very quickly he also has episodes where he has to vomit as he feels like the food is very heavy sitting on his stomach in his chest.  He denies any discomfort while swallowing.  He denies any blood in the stools however he does get some loose bowels on and off.  He does have an appetite.  Denies any urinary symptoms.  His last PSA was about a year ago.  He does have history of BPH status post TURP.         Review Of Systems:  GEN- denies fatigue, fever, weight loss,weakness, recent illness HEENT- denies eye drainage,+ change in vision, nasal discharge, CVS- denies chest pain, palpitations RESP- denies SOB, cough, wheeze ABD- + N/V, +change in stools, abd pain GU- denies dysuria, hematuria, +dribbling, incontinence MSK- denies joint pain, muscle aches, injury Neuro- denies headache, dizziness, syncope, seizure activity        Objective:    BP 128/74   Pulse 62   Temp 98.6 F (37 C) (Oral)   Resp 16   Ht 5\' 11"  (1.803 m)   Wt 222 lb (100.7 kg)   SpO2 96%   BMI 30.96 kg/m  GEN- NAD, alert and oriented x3 HEENT- PERRL, EOMI, non injected sclera, pink conjunctiva, MMM, oropharynx clear,decreased vision left eye Neck- Supple, no thyromegaly, no LAD CVS- RRR, no murmur RESP-CTAB ABD-NABS,soft,NT,ND EXT- No edema Pulses- Radial  2+        Assessment & Plan:      Problem List Items Addressed This Visit      Unprioritized   Type 2 diabetes mellitus with diabetic neuropathy, unspecified (HCC)    Symptoms are very concerning.  He has pigmented lesions concerning for possible melanoma though also could be benign tumor.  Concerning is that his vision has decreased significantly in that eye as well as the sharp pains that he has been getting on the right side of his head.  I am going to obtain an MRI of his brain.  We will follow-up with [facility] pending the results. 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  He does have history of  colon polyps with no colon cancer.  No history of prostate cancer.  With his age and significant weight loss I am ordering labs including thyroid PSA and A1c to follow-up his diabetes.  His early satiety and abdominal discomfort will be evaluated with a lipase and a CT of abdomen. R/U Gastric tumor, pancreatitic, liver lesions, bowel lesions  He does take Tums on occasion as well as Nexium advised him to go ahead and take the Nexium daily as this will help coat his stomach.      Relevant Orders   Hemoglobin A1c    Other Visit Diagnoses    Unintentional weight loss    -  Primary   Relevant Orders   CBC with Differential/Platelet   Comprehensive metabolic panel   TSH   CT Abdomen Pelvis W Contrast   Eye lesion       Relevant Orders   MR Brain Wo  Contrast   Vision decreased       Relevant Orders   MR Brain Wo Contrast   Prostate cancer screening       Relevant Orders   PSA   Epigastric pain       Relevant Orders   CT Abdomen Pelvis W Contrast   Lipase   Early satiety       Relevant Orders   CT Abdomen Pelvis W Contrast   Other symptoms and signs involving the nervous system       Relevant Orders   MR Brain Wo Contrast   Abnormal weight loss       Relevant Orders   CT Abdomen Pelvis W Contrast      Note: This dictation was prepared with Dragon dictation along with smaller phrase technology. Any transcriptional errors that result from this process are unintentional.

## 2017-10-12 LAB — COMPREHENSIVE METABOLIC PANEL
AG RATIO: 2 (calc) (ref 1.0–2.5)
ALKALINE PHOSPHATASE (APISO): 57 U/L (ref 40–115)
ALT: 18 U/L (ref 9–46)
AST: 17 U/L (ref 10–35)
Albumin: 4.3 g/dL (ref 3.6–5.1)
BILIRUBIN TOTAL: 0.6 mg/dL (ref 0.2–1.2)
BUN/Creatinine Ratio: 14 (calc) (ref 6–22)
BUN: 17 mg/dL (ref 7–25)
CALCIUM: 9.7 mg/dL (ref 8.6–10.3)
CO2: 26 mmol/L (ref 20–32)
Chloride: 105 mmol/L (ref 98–110)
Creat: 1.2 mg/dL — ABNORMAL HIGH (ref 0.70–1.18)
Globulin: 2.1 g/dL (calc) (ref 1.9–3.7)
Glucose, Bld: 138 mg/dL — ABNORMAL HIGH (ref 65–99)
Potassium: 4.3 mmol/L (ref 3.5–5.3)
SODIUM: 141 mmol/L (ref 135–146)
TOTAL PROTEIN: 6.4 g/dL (ref 6.1–8.1)

## 2017-10-12 LAB — HEMOGLOBIN A1C
Hgb A1c MFr Bld: 6.2 % of total Hgb — ABNORMAL HIGH (ref <5.7)
MEAN PLASMA GLUCOSE: 131 (calc)
eAG (mmol/L): 7.3 (calc)

## 2017-10-12 LAB — CBC WITH DIFFERENTIAL/PLATELET
BASOS ABS: 37 {cells}/uL (ref 0–200)
Basophils Relative: 0.5 %
EOS ABS: 110 {cells}/uL (ref 15–500)
Eosinophils Relative: 1.5 %
HEMATOCRIT: 43.3 % (ref 38.5–50.0)
HEMOGLOBIN: 14.7 g/dL (ref 13.2–17.1)
LYMPHS ABS: 1387 {cells}/uL (ref 850–3900)
MCH: 30.6 pg (ref 27.0–33.0)
MCHC: 33.9 g/dL (ref 32.0–36.0)
MCV: 90 fL (ref 80.0–100.0)
MPV: 11.2 fL (ref 7.5–12.5)
Monocytes Relative: 8.9 %
NEUTROS ABS: 5117 {cells}/uL (ref 1500–7800)
Neutrophils Relative %: 70.1 %
Platelets: 212 10*3/uL (ref 140–400)
RBC: 4.81 10*6/uL (ref 4.20–5.80)
RDW: 12.5 % (ref 11.0–15.0)
Total Lymphocyte: 19 %
WBC: 7.3 10*3/uL (ref 3.8–10.8)
WBCMIX: 650 {cells}/uL (ref 200–950)

## 2017-10-12 LAB — TSH: TSH: 1.3 mIU/L (ref 0.40–4.50)

## 2017-10-12 LAB — PSA: PSA: 2.3 ng/mL (ref <=4.0)

## 2017-10-27 ENCOUNTER — Ambulatory Visit (INDEPENDENT_AMBULATORY_CARE_PROVIDER_SITE_OTHER): Payer: Medicare HMO | Admitting: Family Medicine

## 2017-10-27 ENCOUNTER — Encounter: Payer: Self-pay | Admitting: Family Medicine

## 2017-10-27 VITALS — BP 130/90 | HR 137 | Temp 103.1°F | Resp 18 | Wt 218.0 lb

## 2017-10-27 DIAGNOSIS — R509 Fever, unspecified: Secondary | ICD-10-CM

## 2017-10-27 DIAGNOSIS — J159 Unspecified bacterial pneumonia: Secondary | ICD-10-CM | POA: Diagnosis not present

## 2017-10-27 DIAGNOSIS — J111 Influenza due to unidentified influenza virus with other respiratory manifestations: Secondary | ICD-10-CM

## 2017-10-27 DIAGNOSIS — E860 Dehydration: Secondary | ICD-10-CM | POA: Diagnosis not present

## 2017-10-27 LAB — INFLUENZA A AND B AG, IMMUNOASSAY
INFLUENZA A ANTIGEN: DETECTED — AB
INFLUENZA B ANTIGEN: NOT DETECTED

## 2017-10-27 MED ORDER — ACETAMINOPHEN 160 MG/5ML PO SOLN
650.0000 mg | Freq: Once | ORAL | Status: AC
  Administered 2017-10-27: 650 mg via ORAL

## 2017-10-27 MED ORDER — AMOXICILLIN 500 MG PO CAPS: 1000.0000 mg | ORAL_CAPSULE | Freq: Three times a day (TID) | ORAL | 0 refills | Status: DC

## 2017-10-27 MED ORDER — DEXTROSE 5 % IV SOLN: 1.0000 g | Freq: Once | INTRAVENOUS | Status: DC

## 2017-10-27 MED ORDER — CEFTRIAXONE SODIUM 1 G IJ SOLR
1.0000 g | Freq: Once | INTRAMUSCULAR | Status: AC
  Administered 2017-10-27: 1 g via INTRAMUSCULAR

## 2017-10-27 MED ORDER — AZITHROMYCIN 250 MG PO TABS: ORAL_TABLET | ORAL | 0 refills | Status: DC

## 2017-10-27 MED ORDER — OSELTAMIVIR PHOSPHATE 75 MG PO CAPS: 75.0000 mg | ORAL_CAPSULE | Freq: Two times a day (BID) | ORAL | 0 refills | Status: DC

## 2017-10-27 NOTE — Progress Notes (Signed)
Subjective:    Patient ID: Jimmy Bradshaw, male    DOB: 01-25-1946, 72 y.o.   MRN: [id number]  HPI Symptoms began late Tuesday evening but dramatically worsened today.  Symptoms include fever to 103, weakness, fatigue, nonproductive cough, left-sided pleurisy, dull headache, bilateral ear pain, sore throat.  Patient has classic presentation for viral influenza despite having a flu shot.  However physical exam is also concerning for left-sided pneumonia as he has pronounced left basilar crackles and rales Past Medical History:  Diagnosis Date  . Anxiety   . Arthritis    HANDS,  SHOULDERS  . Bladder neck obstruction   . BPH (benign prostatic hypertrophy)   . Frequency of urination   . H/O: gout   . History of colon polyps   . Hyperlipidemia   . Nocturia   . Type 2 diabetes mellitus ([place])   . Urgency of urination   . Wears hearing aid    BILATERAL   Past Surgical History:  Procedure Laterality Date  . CARDIOVERSION N/A 10/26/2016   Procedure: CARDIOVERSION;  Surgeon: Larey Dresser, MD;  Location: [place];  Service: Cardiovascular;  Laterality: N/A;  . COLONOSCOPY  08-30-2011  . I&D KNEE WITH POLY EXCHANGE Left 10/08/2014   Procedure: IRRIGATION AND DEBRIDEMENT LEFT UNI KNEE ARTHOPLASTY  WITH POLY EXCHANGE;  Surgeon: Mauri Pole, MD;  Location: [facility];  Service: Orthopedics;  Laterality: Left;  . KNEE ARTHROSCOPY Left 11/ 2013  . PARTIAL KNEE ARTHROPLASTY Left 02/05/2013   Procedure: LEFT KNEE MEDIAL UNICOMPARTMENTAL KNEE;  Surgeon: Mauri Pole, MD;  Location: [facility];  Service: Orthopedics;  Laterality: Left;  . TRANSURETHRAL RESECTION OF PROSTATE N/A 09/30/2014   Procedure: TRANSURETHRAL RESECTION OF THE PROSTATE WITH GYRUS INSTRUMENTS;  Surgeon: Bernestine Amass, MD;  Location: [facility];  Service: Urology;  Laterality: N/A;   Current Outpatient Medications on File Prior to Visit  Medication Sig Dispense Refill  . ELIQUIS 5 MG TABS tablet TAKE 1 TABLET  TWICE DAILY 180 tablet 2  . flecainide (TAMBOCOR) 50 MG tablet Take 1 tablet (50 mg total) by mouth 2 (two) times daily. 180 tablet 3  . gabapentin (NEURONTIN) 100 MG capsule TAKE 1 TO 3 CAPSULES AT BEDTIME FOR NERVE PAIN 90 capsule 2  . lisinopril (PRINIVIL,ZESTRIL) 2.5 MG tablet Take 1 tablet (2.5 mg total) by mouth daily. 90 tablet 1  . metFORMIN (GLUCOPHAGE-XR) 500 MG 24 hr tablet TAKE 1 TABLET EVERY DAY WITH BREAKFAST 90 tablet 1  . metoprolol succinate (TOPROL-XL) 25 MG 24 hr tablet TAKE 1 TABLET (25 MG TOTAL) BY MOUTH DAILY. 90 tablet 3  . rosuvastatin (CRESTOR) 10 MG tablet TAKE 1 TABLET EVERY DAY 90 tablet 3   No current facility-administered medications on file prior to visit.    Allergies  Allergen Reactions  . Pravastatin Other (See Comments)    Headache and legs hurt  . Penicillins Rash   Social History   Socioeconomic History  . Marital status: Married    Spouse name: Not on file  . Number of children: Not on file  . Years of education: Not on file  . Highest education level: Not on file  Social Needs  . Financial resource strain: Not on file  . Food insecurity - worry: Not on file  . Food insecurity - inability: Not on file  . Transportation needs - medical: Not on file  . Transportation needs - non-medical: Not on file  Occupational History  . Not on  file  Tobacco Use  . Smoking status: Former Smoker    Years: 4.00    Types: Cigarettes    Last attempt to quit: 12/08/1998    Years since quitting: 18.8  . Smokeless tobacco: Never Used  Substance and Sexual Activity  . Alcohol use: Yes    Comment: RARE  . Drug use: No  . Sexual activity: Not on file  Other Topics Concern  . Not on file  Social History Narrative  . Not on file      Review of Systems  All other systems reviewed and are negative.      Objective:   Physical Exam  Constitutional: He appears well-developed and well-nourished. He is active. He appears ill.  HENT:  Right Ear: External  ear normal.  Left Ear: External ear normal.  Nose: Nose normal.  Mouth/Throat: Oropharynx is clear and moist. No oropharyngeal exudate.  Eyes: Right conjunctiva is injected. Left conjunctiva is injected.  Neck: Neck supple.  Cardiovascular: Regular rhythm. Tachycardia present.  Pulmonary/Chest: No accessory muscle usage. No respiratory distress. He has no decreased breath sounds. He has no wheezes. He has rhonchi in the left middle field and the left lower field. He has rales in the left middle field and the left lower field.  Abdominal: Soft. Bowel sounds are normal. He exhibits no distension. There is no tenderness. There is no rebound and no guarding.  Lymphadenopathy:    He has no cervical adenopathy.  Vitals reviewed.         Assessment & Plan:  Fever, unspecified fever cause - Plan: Influenza A and B Ag, Immunoassay  Community acquired bacterial pneumonia  Influenza  Dehydration  3:00 Clinically the patient has influenza with left lower lobe pneumonia.  He is also dehydrated.  We discussed options including going to the emergency room versus an attempt at outpatient treatment.  I will give the patient 1 g of Rocephin IM and then start him on:  Amoxicillin 1 g p.o. 3 times daily for 10 days Azithromycin 500 mg p.o. x1, 250 mg p.o. daily for days 2-5   to cover community-acquired pneumonia.  Patient cannot use a fluoroquinolone because of the QT prolongation and the fact he is on flecainide.  Simultaneous use could be dangerous.  States that he was told he was allergic to penicillin as a child.  He believes he had a rash.  He denies any history of anaphylaxis.  He is willing to try penicillin again.   I will also start the patient on Tamiflu 75 mg p.o. twice daily for what appears to be clinically viral influenza.  I will treat his dehydration by giving the patient 1 L of normal saline IV bolus. He will be monitored here in the office for more than an hour receiving IV fluids.   Patient is instructed to go to the emergency room immediately if his symptoms worsen.  Otherwise, recheck tomorrow.    3:22 Patient positive for influenza A.  Was given 650 mg of tylenol with NS IV to bring down fever.  Received 1 g of rocephin IV.    4:00- Patient has finished IV fluids.  Fever is down to 99.9 after receiving Tylenol and IV fluids.  He is feeling some better.  Patient feels comfortable going home and attempting outpatient treatment with Tamiflu as well as the antibiotics mentioned above.  Recheck tomorrow.  If symptoms worsen, he is to go to the hospital

## 2017-10-28 ENCOUNTER — Encounter (HOSPITAL_COMMUNITY): Payer: Self-pay

## 2017-10-28 ENCOUNTER — Ambulatory Visit (INDEPENDENT_AMBULATORY_CARE_PROVIDER_SITE_OTHER): Payer: Medicare HMO | Admitting: Family Medicine

## 2017-10-28 ENCOUNTER — Other Ambulatory Visit: Payer: Self-pay

## 2017-10-28 ENCOUNTER — Emergency Department (HOSPITAL_COMMUNITY): Payer: Medicare HMO

## 2017-10-28 ENCOUNTER — Inpatient Hospital Stay (HOSPITAL_COMMUNITY)
Admission: EM | Admit: 2017-10-28 | Discharge: 2017-10-30 | DRG: 194 | Disposition: A | Discharge status: Home / Self Care | Payer: Medicare HMO | Attending: Internal Medicine | Admitting: Internal Medicine

## 2017-10-28 ENCOUNTER — Other Ambulatory Visit: Payer: Medicare HMO

## 2017-10-28 ENCOUNTER — Encounter: Payer: Self-pay | Admitting: Family Medicine

## 2017-10-28 ENCOUNTER — Encounter (HOSPITAL_COMMUNITY): Payer: Self-pay | Admitting: Family Medicine

## 2017-10-28 VITALS — BP 128/82 | HR 96 | Temp 98.5°F | Resp 18 | Ht 71.0 in | Wt 218.0 lb

## 2017-10-28 DIAGNOSIS — I480 Paroxysmal atrial fibrillation: Secondary | ICD-10-CM

## 2017-10-28 DIAGNOSIS — E785 Hyperlipidemia, unspecified: Secondary | ICD-10-CM | POA: Diagnosis present

## 2017-10-28 DIAGNOSIS — J101 Influenza due to other identified influenza virus with other respiratory manifestations: Secondary | ICD-10-CM

## 2017-10-28 DIAGNOSIS — E860 Dehydration: Secondary | ICD-10-CM

## 2017-10-28 DIAGNOSIS — Z888 Allergy status to other drugs, medicaments and biological substances status: Secondary | ICD-10-CM

## 2017-10-28 DIAGNOSIS — Z974 Presence of external hearing-aid: Secondary | ICD-10-CM

## 2017-10-28 DIAGNOSIS — N179 Acute kidney failure, unspecified: Secondary | ICD-10-CM | POA: Diagnosis present

## 2017-10-28 DIAGNOSIS — E861 Hypovolemia: Secondary | ICD-10-CM | POA: Diagnosis present

## 2017-10-28 DIAGNOSIS — J111 Influenza due to unidentified influenza virus with other respiratory manifestations: Secondary | ICD-10-CM

## 2017-10-28 DIAGNOSIS — Z96652 Presence of left artificial knee joint: Secondary | ICD-10-CM | POA: Diagnosis not present

## 2017-10-28 DIAGNOSIS — Z7984 Long term (current) use of oral hypoglycemic drugs: Secondary | ICD-10-CM

## 2017-10-28 DIAGNOSIS — Z87891 Personal history of nicotine dependence: Secondary | ICD-10-CM

## 2017-10-28 DIAGNOSIS — Z7901 Long term (current) use of anticoagulants: Secondary | ICD-10-CM

## 2017-10-28 DIAGNOSIS — Z880 Allergy status to penicillin: Secondary | ICD-10-CM

## 2017-10-28 DIAGNOSIS — I959 Hypotension, unspecified: Secondary | ICD-10-CM | POA: Diagnosis present

## 2017-10-28 DIAGNOSIS — Z79899 Other long term (current) drug therapy: Secondary | ICD-10-CM

## 2017-10-28 DIAGNOSIS — I10 Essential (primary) hypertension: Secondary | ICD-10-CM | POA: Diagnosis present

## 2017-10-28 DIAGNOSIS — R197 Diarrhea, unspecified: Secondary | ICD-10-CM | POA: Diagnosis not present

## 2017-10-28 DIAGNOSIS — R51 Headache: Secondary | ICD-10-CM | POA: Diagnosis not present

## 2017-10-28 DIAGNOSIS — J189 Pneumonia, unspecified organism: Secondary | ICD-10-CM | POA: Diagnosis not present

## 2017-10-28 DIAGNOSIS — E872 Acidosis: Secondary | ICD-10-CM | POA: Diagnosis present

## 2017-10-28 DIAGNOSIS — E876 Hypokalemia: Secondary | ICD-10-CM | POA: Diagnosis not present

## 2017-10-28 DIAGNOSIS — I4891 Unspecified atrial fibrillation: Secondary | ICD-10-CM | POA: Diagnosis not present

## 2017-10-28 DIAGNOSIS — R531 Weakness: Secondary | ICD-10-CM | POA: Diagnosis not present

## 2017-10-28 DIAGNOSIS — R0602 Shortness of breath: Secondary | ICD-10-CM | POA: Diagnosis not present

## 2017-10-28 DIAGNOSIS — E1140 Type 2 diabetes mellitus with diabetic neuropathy, unspecified: Secondary | ICD-10-CM | POA: Diagnosis present

## 2017-10-28 LAB — COMPREHENSIVE METABOLIC PANEL
ALBUMIN: 3.6 g/dL (ref 3.5–5.0)
ALT: 21 U/L (ref 17–63)
ANION GAP: 13 (ref 5–15)
AST: 26 U/L (ref 15–41)
Alkaline Phosphatase: 51 U/L (ref 38–126)
BILIRUBIN TOTAL: 0.9 mg/dL (ref 0.3–1.2)
BUN: 11 mg/dL (ref 6–20)
CALCIUM: 8.7 mg/dL — AB (ref 8.9–10.3)
CO2: 21 mmol/L — ABNORMAL LOW (ref 22–32)
Chloride: 101 mmol/L (ref 101–111)
Creatinine, Ser: 1 mg/dL (ref 0.61–1.24)
GFR calc Af Amer: >60 mL/min (ref >60)
GLUCOSE: 138 mg/dL — AB (ref 65–99)
POTASSIUM: 3.4 mmol/L — AB (ref 3.5–5.1)
Sodium: 135 mmol/L (ref 135–145)
TOTAL PROTEIN: 6.9 g/dL (ref 6.5–8.1)

## 2017-10-28 LAB — CBC WITH DIFFERENTIAL/PLATELET
BASOS ABS: 0 10*3/uL (ref 0.0–0.1)
BASOS PCT: 0 %
EOS PCT: 0 %
Eosinophils Absolute: 0 10*3/uL (ref 0.0–0.7)
HEMATOCRIT: 43.8 % (ref 39.0–52.0)
Hemoglobin: 15 g/dL (ref 13.0–17.0)
LYMPHS PCT: 11 %
Lymphs Abs: 1.4 10*3/uL (ref 0.7–4.0)
MCH: 30.9 pg (ref 26.0–34.0)
MCHC: 34.2 g/dL (ref 30.0–36.0)
MCV: 90.3 fL (ref 78.0–100.0)
MONO ABS: 1.8 10*3/uL — AB (ref 0.1–1.0)
Monocytes Relative: 15 %
NEUTROS ABS: 9.2 10*3/uL — AB (ref 1.7–7.7)
Neutrophils Relative %: 74 %
PLATELETS: 153 10*3/uL (ref 150–400)
RBC: 4.85 MIL/uL (ref 4.22–5.81)
RDW: 13.1 % (ref 11.5–15.5)
WBC: 12.4 10*3/uL — AB (ref 4.0–10.5)

## 2017-10-28 LAB — URINALYSIS, ROUTINE W REFLEX MICROSCOPIC
BILIRUBIN URINE: NEGATIVE
Glucose, UA: NEGATIVE mg/dL
KETONES UR: 5 mg/dL — AB
LEUKOCYTES UA: NEGATIVE
NITRITE: NEGATIVE
PH: 6 (ref 5.0–8.0)
Protein, ur: 30 mg/dL — AB
SPECIFIC GRAVITY, URINE: 1.012 (ref 1.005–1.030)

## 2017-10-28 LAB — CBG MONITORING, ED: Glucose-Capillary: 72 mg/dL (ref 65–99)

## 2017-10-28 LAB — I-STAT CG4 LACTIC ACID, ED: Lactic Acid, Venous: 1.39 mmol/L (ref 0.5–1.9)

## 2017-10-28 MED ORDER — BISACODYL 5 MG PO TBEC: 5.0000 mg | DELAYED_RELEASE_TABLET | Freq: Every day | ORAL | Status: DC | PRN

## 2017-10-28 MED ORDER — OSELTAMIVIR PHOSPHATE 75 MG PO CAPS
75.0000 mg | ORAL_CAPSULE | Freq: Two times a day (BID) | ORAL | Status: DC
  Administered 2017-10-29 – 2017-10-30 (×4): 75 mg via ORAL
  Filled 2017-10-28 (×4): qty 1

## 2017-10-28 MED ORDER — SODIUM CHLORIDE 0.9 % IV BOLUS (SEPSIS)
1000.0000 mL | Freq: Once | INTRAVENOUS | Status: AC
  Administered 2017-10-28: 1000 mL via INTRAVENOUS

## 2017-10-28 MED ORDER — HYDROCODONE-ACETAMINOPHEN 5-325 MG PO TABS
1.0000 | ORAL_TABLET | ORAL | Status: DC | PRN
  Filled 2017-10-28: qty 1

## 2017-10-28 MED ORDER — ROSUVASTATIN CALCIUM 10 MG PO TABS
10.0000 mg | ORAL_TABLET | Freq: Every day | ORAL | Status: DC
  Administered 2017-10-29: 10 mg via ORAL
  Filled 2017-10-28: qty 1

## 2017-10-28 MED ORDER — GABAPENTIN 100 MG PO CAPS: 200.0000 mg | ORAL_CAPSULE | Freq: Every evening | ORAL | Status: DC | PRN

## 2017-10-28 MED ORDER — FLECAINIDE ACETATE 50 MG PO TABS
50.0000 mg | ORAL_TABLET | Freq: Two times a day (BID) | ORAL | Status: DC
  Administered 2017-10-29 – 2017-10-30 (×3): 50 mg via ORAL
  Filled 2017-10-28 (×4): qty 1

## 2017-10-28 MED ORDER — INSULIN ASPART 100 UNIT/ML ~~LOC~~ SOLN: 0.0000 [IU] | Freq: Every day | SUBCUTANEOUS | Status: DC

## 2017-10-28 MED ORDER — ALBUTEROL SULFATE (2.5 MG/3ML) 0.083% IN NEBU: 2.5000 mg | INHALATION_SOLUTION | RESPIRATORY_TRACT | Status: DC | PRN

## 2017-10-28 MED ORDER — ONDANSETRON HCL 4 MG/2ML IJ SOLN: 4.0000 mg | Freq: Four times a day (QID) | INTRAMUSCULAR | Status: DC | PRN

## 2017-10-28 MED ORDER — APIXABAN 5 MG PO TABS
5.0000 mg | ORAL_TABLET | Freq: Two times a day (BID) | ORAL | Status: DC
  Administered 2017-10-29 – 2017-10-30 (×4): 5 mg via ORAL
  Filled 2017-10-28 (×4): qty 1

## 2017-10-28 MED ORDER — ONDANSETRON HCL 4 MG PO TABS: 4.0000 mg | ORAL_TABLET | Freq: Four times a day (QID) | ORAL | Status: DC | PRN

## 2017-10-28 MED ORDER — ENOXAPARIN SODIUM 40 MG/0.4ML ~~LOC~~ SOLN: 40.0000 mg | SUBCUTANEOUS | Status: DC

## 2017-10-28 MED ORDER — KETOROLAC TROMETHAMINE 15 MG/ML IJ SOLN: 15.0000 mg | Freq: Four times a day (QID) | INTRAMUSCULAR | Status: DC | PRN

## 2017-10-28 MED ORDER — ZOLPIDEM TARTRATE 5 MG PO TABS
5.0000 mg | ORAL_TABLET | Freq: Every evening | ORAL | Status: DC | PRN
  Administered 2017-10-30: 5 mg via ORAL
  Filled 2017-10-28: qty 1

## 2017-10-28 MED ORDER — POTASSIUM CHLORIDE CRYS ER 20 MEQ PO TBCR
40.0000 meq | EXTENDED_RELEASE_TABLET | Freq: Once | ORAL | Status: AC
  Administered 2017-10-28: 40 meq via ORAL
  Filled 2017-10-28: qty 2

## 2017-10-28 MED ORDER — ALBUTEROL SULFATE (2.5 MG/3ML) 0.083% IN NEBU
5.0000 mg | INHALATION_SOLUTION | Freq: Once | RESPIRATORY_TRACT | Status: AC
  Administered 2017-10-28: 5 mg via RESPIRATORY_TRACT
  Filled 2017-10-28: qty 6

## 2017-10-28 MED ORDER — ACETAMINOPHEN 650 MG RE SUPP: 650.0000 mg | Freq: Four times a day (QID) | RECTAL | Status: DC | PRN

## 2017-10-28 MED ORDER — SODIUM CHLORIDE 0.9 % IV SOLN
INTRAVENOUS | Status: DC
  Administered 2017-10-28: 21:00:00 via INTRAVENOUS

## 2017-10-28 MED ORDER — INSULIN ASPART 100 UNIT/ML ~~LOC~~ SOLN
0.0000 [IU] | Freq: Three times a day (TID) | SUBCUTANEOUS | Status: DC
Start: 2017-10-29 — End: 2017-10-30

## 2017-10-28 MED ORDER — ACETAMINOPHEN 325 MG PO TABS: 650.0000 mg | ORAL_TABLET | Freq: Four times a day (QID) | ORAL | Status: DC | PRN

## 2017-10-28 MED ORDER — SENNOSIDES-DOCUSATE SODIUM 8.6-50 MG PO TABS: 1.0000 | ORAL_TABLET | Freq: Every evening | ORAL | Status: DC | PRN

## 2017-10-28 MED ORDER — SODIUM CHLORIDE 0.9 % IV SOLN
INTRAVENOUS | Status: AC
  Administered 2017-10-29: 05:00:00 via INTRAVENOUS

## 2017-10-28 NOTE — Progress Notes (Signed)
   Subjective:    Patient ID: Jimmy Bradshaw, male    DOB: 08/18/1946, 72 y.o.   MRN: [id number]  Patient presents for Follow-up (Flu/ PNA) Here for 24-hour follow-up.  He was seen yesterday with fever weakness productive cough left-sided pleurisy.  Diagnosis was influenza A positive as well as probable community acquired pneumonia.  He was given Tamiflu as well as Rocephin injection followed by a course of amoxicillin and azithromycin.  While he does have history of penicillin allergy he has been able to tolerate the amoxicillin.  Secondary to his A. fib medications other antibiotics were not used.  He was given 1 L of IV fluids as well as in the office for dehydration. Last night he began having a lot of diarrhea, copius amounts, he has not had anything to eat only small sips Feels very weak. Did not have thermometer to check his temperature. He has kept down his medications. Cough has thick production, still feels tight in the chest  His other complaint is left ear/jaw pain   Review Of Systems:  GEN- + fatigue,+ fever, weight loss,weakness, recent illness HEENT- denies eye drainage, change in vision, nasal discharge, CVS- denies chest pain, palpitations RESP- denies SOB, +cough, wheeze ABD- denies N/V, change in stools, +abd pain GU- denies dysuria, hematuria, dribbling, incontinence MSK- denies joint pain, muscle aches, injury Neuro- denies headache, dizziness, syncope, seizure activity       Objective:    BP 128/82   Pulse 96   Temp 98.5 F (36.9 C) (Oral)   Resp 18   Ht 5\' 11"  (1.803 m)   Wt 218 lb (98.9 kg)   SpO2 94%   BMI 30.40 kg/m  GEN- NAD, alert and oriented x3, ill appearing, laying on the bed , flushed  HEENT- PERRL, EOMI, non injected sclera, pink conjunctiva, dry MM oropharynx clear Neck- Supple, + LAD CVS- RRR, no murmur RESP-rhonchi bilat, few scattered wheeze ABD-NABS,soft,NT,ND EXT- No edema Pulses- Radial  2+        Assessment & Plan:       Problem List Items Addressed This Visit    None    Visit Diagnoses    Influenza A    -  Primary   With his age, poor PO intake, now with diarrhea, likely due to antibiotics, will send to ER. Family in agreement. He is high risk for further decompensation and dehydration.  Will need complete treatment for fu Labs CXR  Directed to [facility] ER, report called    Community acquired pneumonia, unspecified laterality       Dehydration       Diarrhea, unspecified type          Note: This dictation was prepared with Dragon dictation along with smaller phrase technology. Any transcriptional errors that result from this process are unintentional.

## 2017-10-28 NOTE — ED Provider Notes (Signed)
[facility] EMERGENCY DEPARTMENT Provider Note   CSN: [id number] Arrival date & time: 10/28/17  1136     History   Chief Complaint Chief Complaint  Patient presents with  . Pneumonia    HPI GERHART RUGGIERI is a 72 y.o. male.  HPI  72 year old male presents from his PCPs office for pneumonia and influenza A.  Patient states he started feeling ill on 2/5 with a sore throat that was mild.  Next day he started having full-blown symptoms with headache, fever, cough, and body aches.  This has continued.  Seen in his doctor's office yesterday and started on Tamiflu as well as amoxicillin and azithromycin for pneumonia.  Was given IV fluids as well and IM Rocephin.  Today went back to the office and given he is still feeling ill they sent him here for further evaluation.  Patient denies any nausea or vomiting.  He feels overall weak and fatigued.  Past Medical History:  Diagnosis Date  . Anxiety   . Arthritis    HANDS,  SHOULDERS  . Bladder neck obstruction   . BPH (benign prostatic hypertrophy)   . Frequency of urination   . H/O: gout   . History of colon polyps   . Hyperlipidemia   . Nocturia   . Type 2 diabetes mellitus ([place])   . Urgency of urination   . Wears hearing aid    BILATERAL    Patient Active Problem List   Diagnosis Date Noted  . Hypokalemia 10/28/2017  . Influenza A 10/28/2017  . Diabetic neuropathy ([place]) 03/04/2017  . HTN (hypertension) 11/08/2014  . Atrial fibrillation ([place]) 11/06/2014  . Knee pain, acute 10/08/2014  . BPH with urinary obstruction 09/30/2014  . Gout flare 11/27/2013  . Type 2 diabetes mellitus with diabetic neuropathy, unspecified ([place]) 10/16/2013  . Back pain 06/08/2013  . Obesity (BMI 30.0-34.9) 02/06/2013  . 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/05/2013  . Hyperlipidemia   . Anxiety   . BPH (benign prostatic hyperplasia)   . Colon polyps   . H/O: gout     Past Surgical History:  Procedure Laterality  Date  . CARDIOVERSION N/A 10/26/2016   Procedure: CARDIOVERSION;  Surgeon: Larey Dresser, MD;  Location: [place];  Service: Cardiovascular;  Laterality: N/A;  . COLONOSCOPY  08-30-2011  . I&D KNEE WITH POLY EXCHANGE Left 10/08/2014   Procedure: IRRIGATION AND DEBRIDEMENT LEFT UNI KNEE ARTHOPLASTY  WITH POLY EXCHANGE;  Surgeon: Mauri Pole, MD;  Location: [facility];  Service: Orthopedics;  Laterality: Left;  . KNEE ARTHROSCOPY Left 11/ 2013  . PARTIAL KNEE ARTHROPLASTY Left 02/05/2013   Procedure: LEFT KNEE MEDIAL UNICOMPARTMENTAL KNEE;  Surgeon: Mauri Pole, MD;  Location: [facility];  Service: Orthopedics;  Laterality: Left;  . TRANSURETHRAL RESECTION OF PROSTATE N/A 09/30/2014   Procedure: TRANSURETHRAL RESECTION OF THE PROSTATE WITH GYRUS INSTRUMENTS;  Surgeon: Bernestine Amass, MD;  Location: [facility];  Service: Urology;  Laterality: N/A;       Home Medications    Prior to Admission medications   Medication Sig Start Date End Date Taking? Authorizing Provider  ELIQUIS 5 MG TABS tablet TAKE 1 TABLET TWICE DAILY Patient taking differently: TAKE 5 mg  TABLET TWICE DAILY 09/26/17  Yes Nahser, Wonda Cheng, MD  flecainide (TAMBOCOR) 50 MG tablet Take 1 tablet (50 mg total) by mouth 2 (two) times daily. 11/02/16  Yes Nahser, Wonda Cheng, MD  gabapentin (NEURONTIN) 100 MG capsule TAKE 1 TO  3 CAPSULES AT BEDTIME FOR NERVE PAIN Patient taking differently: Take 200-300 mg by mouth at bedtime as needed. TAKE 1 TO 3 CAPSULES AT BEDTIME FOR NERVE PAIN 08/03/17  Yes Emily, Modena Nunnery, MD  lisinopril (PRINIVIL,ZESTRIL) 2.5 MG tablet Take 1 tablet (2.5 mg total) by mouth daily. 03/04/17  Yes [place], Modena Nunnery, MD  metFORMIN (GLUCOPHAGE-XR) 500 MG 24 hr tablet TAKE 1 TABLET EVERY DAY WITH BREAKFAST Patient taking differently: Take 500 mg by mouth daily with breakfast. TAKE 1 TABLET EVERY DAY WITH BREAKFAST 03/04/17  Yes [place], Modena Nunnery, MD  metoprolol succinate (TOPROL-XL) 25 MG 24 hr tablet  TAKE 1 TABLET (25 MG TOTAL) BY MOUTH DAILY. 01/11/17  Yes Nahser, Wonda Cheng, MD  oseltamivir (TAMIFLU) 75 MG capsule Take 1 capsule (75 mg total) by mouth 2 (two) times daily. 10/27/17  Yes Susy Frizzle, MD  rosuvastatin (CRESTOR) 10 MG tablet TAKE 1 TABLET EVERY DAY Patient taking differently: TAKE 10 mg  TABLET EVERY DAY 04/08/17  Yes Gervais, Modena Nunnery, MD  azithromycin [facility]) 250 MG tablet 2 tabs poqday1, 1 tab poqday 2-5 Patient not taking: Reported on 10/28/2017 10/27/17   Susy Frizzle, MD    Family History Family History  Adopted: Yes  Problem Relation Age of Onset  . Colon cancer Father     Social History Social History   Tobacco Use  . Smoking status: Former Smoker    Years: 4.00    Types: Cigarettes    Last attempt to quit: 12/08/1998    Years since quitting: 18.9  . Smokeless tobacco: Never Used  Substance Use Topics  . Alcohol use: Yes    Comment: RARE  . Drug use: No     Allergies   Pravastatin and Penicillins   Review of Systems Review of Systems  Constitutional: Positive for fatigue and fever.  HENT: Positive for congestion and sore throat.   Respiratory: Positive for cough and shortness of breath.   Gastrointestinal: Negative for vomiting.  Neurological: Positive for weakness and headaches.  All other systems reviewed and are negative.    Physical Exam Updated Vital Signs BP 109/71   Pulse 72   Temp 98.8 F (37.1 C) (Oral)   Resp 16   Ht 5\' 11"  (1.803 m)   Wt 98.9 kg (218 lb)   SpO2 98%   BMI 30.40 kg/m   Physical Exam  Constitutional: He is oriented to person, place, and time. He appears well-developed and well-nourished. No distress.  HENT:  Head: Normocephalic and atraumatic.  Right Ear: External ear normal.  Left Ear: External ear normal.  Nose: Nose normal.  Eyes: Right eye exhibits no discharge. Left eye exhibits no discharge.  Neck: Neck supple.  Cardiovascular: Normal rate, regular rhythm and normal heart sounds.    Pulmonary/Chest: Effort normal. No respiratory distress. He has wheezes. He has no rales.  Abdominal: Soft. There is no tenderness.  Musculoskeletal: He exhibits no edema.  Neurological: He is alert and oriented to person, place, and time.  Skin: Skin is warm and dry. He is not diaphoretic.  Nursing note and vitals reviewed.    ED Treatments / Results  Labs (all labs ordered are listed, but only abnormal results are displayed) Labs Reviewed  COMPREHENSIVE METABOLIC PANEL - Abnormal; Notable for the following components:      Result Value   Potassium 3.4 (*)    CO2 21 (*)    Glucose, Bld 138 (*)    Calcium 8.7 (*)  All other components within normal limits  CBC WITH DIFFERENTIAL/PLATELET - Abnormal; Notable for the following components:   WBC 12.4 (*)    Neutro Abs 9.2 (*)    Monocytes Absolute 1.8 (*)    All other components within normal limits  URINALYSIS, ROUTINE W REFLEX MICROSCOPIC - Abnormal; Notable for the following components:   APPearance HAZY (*)    Hgb urine dipstick SMALL (*)    Ketones, ur 5 (*)    Protein, ur 30 (*)    Bacteria, UA RARE (*)    Squamous Epithelial / LPF 0-5 (*)    All other components within normal limits  CBC WITH DIFFERENTIAL/PLATELET  BASIC METABOLIC PANEL  MAGNESIUM  I-STAT CG4 LACTIC ACID, ED    EKG  EKG Interpretation None       Radiology Dg Chest 2 View  Result Date: 10/28/2017 CLINICAL DATA:  Flu like symptoms for 2 days. EXAM: CHEST  2 VIEW COMPARISON:  01/19/2007 FINDINGS: Cardiac silhouette is normal in size and configuration. No mediastinal or hilar masses. No evidence of adenopathy. Lungs are clear. There is mild left lateral lung base pleural thickening. No pleural effusion or pneumothorax. Skeletal structures are demineralized but intact. IMPRESSION: No active cardiopulmonary disease. Electronically Signed   By: Lajean Manes M.D.   On: 10/28/2017 12:24    Procedures Procedures (including critical care  time)  Medications Ordered in ED Medications  0.9 %  sodium chloride infusion ( Intravenous Rate/Dose Verify 10/28/17 2130)  apixaban (ELIQUIS) tablet 5 mg (not administered)  flecainide (TAMBOCOR) tablet 50 mg (not administered)  gabapentin (NEURONTIN) capsule 200-300 mg (not administered)  oseltamivir (TAMIFLU) capsule 75 mg (not administered)  rosuvastatin (CRESTOR) tablet 10 mg (not administered)  acetaminophen (TYLENOL) tablet 650 mg (not administered)    Or  acetaminophen (TYLENOL) suppository 650 mg (not administered)  HYDROcodone-acetaminophen (NORCO/VICODIN) 5-325 MG per tablet 1-2 tablet (not administered)  ketorolac (TORADOL) 15 MG/ML injection 15 mg (not administered)  senna-docusate (Senokot-S) tablet 1 tablet (not administered)  bisacodyl (DULCOLAX) EC tablet 5 mg (not administered)  ondansetron (ZOFRAN) tablet 4 mg (not administered)    Or  ondansetron (ZOFRAN) injection 4 mg (not administered)  insulin aspart (novoLOG) injection 0-9 Units (not administered)  insulin aspart (novoLOG) injection 0-5 Units (not administered)  albuterol (PROVENTIL) (2.5 MG/3ML) 0.083% nebulizer solution 2.5 mg (not administered)  zolpidem (AMBIEN) tablet 5 mg (not administered)  sodium chloride 0.9 % bolus 1,000 mL (0 mLs Intravenous Stopped 10/28/17 2047)  albuterol (PROVENTIL) (2.5 MG/3ML) 0.083% nebulizer solution 5 mg (5 mg Nebulization Given 10/28/17 1846)  sodium chloride 0.9 % bolus 1,000 mL (0 mLs Intravenous Stopped 10/28/17 2149)  potassium chloride SA (K-DUR,KLOR-CON) CR tablet 40 mEq (40 mEq Oral Given 10/28/17 1846)     Initial Impression / Assessment and Plan / ED Course  I have reviewed the triage vital signs and the nursing notes.  Pertinent labs & imaging results that were available during my care of the patient were reviewed by me and considered in my medical decision making (see chart for details).     Patient presents with confirmed influenza A and continue to feel ill with  low blood pressures.  His pressures have been in the 80s and 90s.  He does not appear septic but does overall appear to not feel well.  I think he would benefit from some continued IV fluids.  His pressure is better after IV fluids in the ED and will continue on normal saline while in the  hospital.  Admit to the hospitalist.  My suspicion is that he does not truly have pneumonia but that all of his symptoms are related to the influenza.  Final Clinical Impressions(s) / ED Diagnoses   Final diagnoses:  Influenza A    ED Discharge Orders    None       Sherwood Gambler, MD 10/28/17 2205

## 2017-10-28 NOTE — ED Triage Notes (Signed)
Per Pt, Pt is coming from PCP with diagnosis of pneumonia, Influenza A, dehydration and poor oral intake. Started on antibiotics.

## 2017-10-28 NOTE — ED Triage Notes (Signed)
Patient sent POV from [facility] for worsening SOB and dehydration - was seen in their office yesterday, dx with PNA and + flu. Received 2L fluid and 1g Rocephin yesterday, sent home with RX for Tamiflu and started two antibiotics, one of which is amoxicillin - pt reports having had diarrhea since starting it.

## 2017-10-28 NOTE — H&P (Signed)
History and Physical    Jimmy Bradshaw MWU:[id number] DOB: 11/13/1945 DOA: 10/28/2017  PCP: Alycia Rossetti, MD   Patient coming from: Home  Chief Complaint: Fever, cough, wheezing, SOB, aches   HPI: Jimmy Bradshaw is a 72 y.o. male with medical history significant for atrial fibrillation on Eliquis, hypertension, and type 2 diabetes mellitus, now presenting to the emergency department for evaluation of fevers, chills, aches, cough, wheezing, and shortness of breath.  Patient reports that he began to develop these symptoms on 10/25/2017, worsened significantly on 10/27/2017, was evaluated by his PCP on that date, diagnosed with influenza A, treated with Tamiflu, empiric antibiotics for suspected pneumonia, and was given 1 L of normal saline in the clinic.  He was seen back in the clinic today for follow-up, appeared to be dehydrated and reported some diarrhea with the antibiotics.  He had ongoing shortness of breath and wheezing, appears acutely ill, and was directed to the ED.  ED Course: Upon arrival to the ED, patient is found to be afebrile, saturating adequately on room air, with initial blood pressure of 89/69, and normal heart rate.  Chest x-ray is negative for acute cardiopulmonary disease, chemistry panel reveals a mild hypokalemia, and CBC is notable for a leukocytosis to 12,400.  Lactic acid is reassuringly normal.  Patient was given 2 L of normal saline, albuterol neb, and 40 mEq of oral potassium in the ED.  Blood pressure has improved, patient is dyspneic but not in acute distress, and he will be observed on the medical-surgical unit for ongoing evaluation and management of influenza A with dehydration and low blood pressures.  Review of Systems:  All other systems reviewed and apart from HPI, are negative.  Past Medical History:  Diagnosis Date  . Anxiety   . Arthritis    HANDS,  SHOULDERS  . Bladder neck obstruction   . BPH (benign prostatic hypertrophy)   . Frequency of  urination   . H/O: gout   . History of colon polyps   . Hyperlipidemia   . Nocturia   . Type 2 diabetes mellitus ([place])   . Urgency of urination   . Wears hearing aid    BILATERAL    Past Surgical History:  Procedure Laterality Date  . CARDIOVERSION N/A 10/26/2016   Procedure: CARDIOVERSION;  Surgeon: Larey Dresser, MD;  Location: [facility];  Service: Cardiovascular;  Laterality: N/A;  . COLONOSCOPY  08-30-2011  . I&D KNEE WITH POLY EXCHANGE Left 10/08/2014   Procedure: IRRIGATION AND DEBRIDEMENT LEFT UNI KNEE ARTHOPLASTY  WITH POLY EXCHANGE;  Surgeon: Mauri Pole, MD;  Location: [facility];  Service: Orthopedics;  Laterality: Left;  . KNEE ARTHROSCOPY Left 11/ 2013  . PARTIAL KNEE ARTHROPLASTY Left 02/05/2013   Procedure: LEFT KNEE MEDIAL UNICOMPARTMENTAL KNEE;  Surgeon: Mauri Pole, MD;  Location: [facility];  Service: Orthopedics;  Laterality: Left;  . TRANSURETHRAL RESECTION OF PROSTATE N/A 09/30/2014   Procedure: TRANSURETHRAL RESECTION OF THE PROSTATE WITH GYRUS INSTRUMENTS;  Surgeon: Bernestine Amass, MD;  Location: [facility];  Service: Urology;  Laterality: N/A;     reports that he quit smoking about 18 years ago. His smoking use included cigarettes. He quit after 4.00 years of use. he has never used smokeless tobacco. He reports that he drinks alcohol. He reports that he does not use drugs.  Allergies  Allergen Reactions  . Pravastatin Other (See Comments)    Headache and legs hurt  . Penicillins Rash  Family History  Adopted: Yes  Problem Relation Age of Onset  . Colon cancer Father      Prior to Admission medications   Medication Sig Start Date End Date Taking? Authorizing Provider  ELIQUIS 5 MG TABS tablet TAKE 1 TABLET TWICE DAILY Patient taking differently: TAKE 5 mg  TABLET TWICE DAILY 09/26/17  Yes Nahser, Wonda Cheng, MD  flecainide (TAMBOCOR) 50 MG tablet Take 1 tablet (50 mg total) by mouth 2 (two) times daily. 11/02/16  Yes Nahser, Wonda Cheng, MD   gabapentin (NEURONTIN) 100 MG capsule TAKE 1 TO 3 CAPSULES AT BEDTIME FOR NERVE PAIN Patient taking differently: Take 200-300 mg by mouth at bedtime as needed. TAKE 1 TO 3 CAPSULES AT BEDTIME FOR NERVE PAIN 08/03/17  Yes [place], Modena Nunnery, MD  lisinopril (PRINIVIL,ZESTRIL) 2.5 MG tablet Take 1 tablet (2.5 mg total) by mouth daily. 03/04/17  Yes [place], Modena Nunnery, MD  metFORMIN (GLUCOPHAGE-XR) 500 MG 24 hr tablet TAKE 1 TABLET EVERY DAY WITH BREAKFAST Patient taking differently: Take 500 mg by mouth daily with breakfast. TAKE 1 TABLET EVERY DAY WITH BREAKFAST 03/04/17  Yes [place], Modena Nunnery, MD  metoprolol succinate (TOPROL-XL) 25 MG 24 hr tablet TAKE 1 TABLET (25 MG TOTAL) BY MOUTH DAILY. 01/11/17  Yes Nahser, Wonda Cheng, MD  oseltamivir (TAMIFLU) 75 MG capsule Take 1 capsule (75 mg total) by mouth 2 (two) times daily. 10/27/17  Yes Susy Frizzle, MD  rosuvastatin (CRESTOR) 10 MG tablet TAKE 1 TABLET EVERY DAY Patient taking differently: TAKE 10 mg  TABLET EVERY DAY 04/08/17  Yes [place], Modena Nunnery, MD  azithromycin [facility]) 250 MG tablet 2 tabs poqday1, 1 tab poqday 2-5 Patient not taking: Reported on 10/28/2017 10/27/17   Susy Frizzle, MD    Physical Exam: Vitals:   10/28/17 1945 10/28/17 2000 10/28/17 2015 10/28/17 2030  BP: 108/74 109/67 110/68 109/69  Pulse: 77 78 76 78  Resp: 18 19 (!) 21 14  Temp:      TempSrc:      SpO2: 98% 98% 98% 100%  Weight:      Height:          Constitutional: Not in acute distress, calm, in obvious discomfort Eyes: PERTLA, lids and conjunctivae normal ENMT: Mucous membranes are moist. Posterior pharynx clear of any exudate or lesions.   Neck: normal, supple, no masses, no thyromegaly Respiratory: Diffuse wheezes, dyspnea with speech. No pallor. No accessory muscle use.  Cardiovascular: S1 & S2 heard, regular rate and rhythm. No significant JVD. Abdomen: No distension, no tenderness, no masses palpated. Bowel sounds active.  Musculoskeletal: no  clubbing / cyanosis. No joint deformity upper and lower extremities. Skin: no significant rashes, lesions, ulcers. Poor turgor. Neurologic: CN 2-12 grossly intact. Sensation intact. Strength 5/5 in all 4 limbs.  Psychiatric: Alert and oriented x 3. Calm, cooperative.     Labs on Admission: I have personally reviewed following labs and imaging studies  CBC: Recent Labs  Lab 10/28/17 1158  WBC 12.4*  NEUTROABS 9.2*  HGB 15.0  HCT 43.8  MCV 90.3  PLT 161   Basic Metabolic Panel: Recent Labs  Lab 10/28/17 1158  NA 135  K 3.4*  CL 101  CO2 21*  GLUCOSE 138*  BUN 11  CREATININE 1.00  CALCIUM 8.7*   GFR: Estimated Creatinine Clearance: 82.3 mL/min (by C-G formula based on SCr of 1 mg/dL). Liver Function Tests: Recent Labs  Lab 10/28/17 1158  AST 26  ALT 21  ALKPHOS 51  BILITOT 0.9  PROT 6.9  ALBUMIN 3.6   No results for input(s): LIPASE, AMYLASE in the last 168 hours. No results for input(s): AMMONIA in the last 168 hours. Coagulation Profile: No results for input(s): INR, PROTIME in the last 168 hours. Cardiac Enzymes: No results for input(s): CKTOTAL, CKMB, CKMBINDEX, TROPONINI in the last 168 hours. BNP (last 3 results) No results for input(s): PROBNP in the last 8760 hours. HbA1C: No results for input(s): HGBA1C in the last 72 hours. CBG: No results for input(s): GLUCAP in the last 168 hours. Lipid Profile: No results for input(s): CHOL, HDL, LDLCALC, TRIG, CHOLHDL, LDLDIRECT in the last 72 hours. Thyroid Function Tests: No results for input(s): TSH, T4TOTAL, FREET4, T3FREE, THYROIDAB in the last 72 hours. Anemia Panel: No results for input(s): VITAMINB12, FOLATE, FERRITIN, TIBC, IRON, RETICCTPCT in the last 72 hours. Urine analysis:    Component Value Date/Time   COLORURINE YELLOW 10/28/2017 1208   APPEARANCEUR HAZY (A) 10/28/2017 1208   APPEARANCEUR Clear 10/21/2016 1400   LABSPEC 1.012 10/28/2017 1208   PHURINE 6.0 10/28/2017 1208   GLUCOSEU  NEGATIVE 10/28/2017 1208   HGBUR SMALL (A) 10/28/2017 1208   BILIRUBINUR NEGATIVE 10/28/2017 1208   BILIRUBINUR Negative 10/21/2016 1400   KETONESUR 5 (A) 10/28/2017 1208   PROTEINUR 30 (A) 10/28/2017 1208   UROBILINOGEN 0.2 10/08/2014 1424   NITRITE NEGATIVE 10/28/2017 1208   LEUKOCYTESUR NEGATIVE 10/28/2017 1208   LEUKOCYTESUR Negative 10/21/2016 1400   Sepsis Labs: @LABRCNTIP (procalcitonin:4,lacticidven:4) )No results found for this or any previous visit (from the past 240 hour(s)).   Radiological Exams on Admission: Dg Chest 2 View  Result Date: 10/28/2017 CLINICAL DATA:  Flu like symptoms for 2 days. EXAM: CHEST  2 VIEW COMPARISON:  01/19/2007 FINDINGS: Cardiac silhouette is normal in size and configuration. No mediastinal or hilar masses. No evidence of adenopathy. Lungs are clear. There is mild left lateral lung base pleural thickening. No pleural effusion or pneumothorax. Skeletal structures are demineralized but intact. IMPRESSION: No active cardiopulmonary disease. Electronically Signed   By: Lajean Manes M.D.   On: 10/28/2017 12:24    EKG: Not performed.   Assessment/Plan  1. Influenza A  - Presents with fevers, cough, SOB, wheezing, aches, and diarrhea  - Was diagnosed with influenza A and suspected PNA on 2/7 in PCP office, started on Tamiflu and abx and given 1 liter NS in clinic  - Despite aggressive outpatient treatment, pt has continued to worsen, appears dehydrated, and has low BP  - CXR is clear and lactate reassuringly normal  - Treated in ED with 2 liters NS and albuterol neb  - Continue IVF hydration, continue Tamiflu, prn analgesia and antipyretics, and prn nebs   2. Atrial fibrillation  - In a regular rhythm on admission  - CHADS-VASc is at least 3 (age, HTN, DM)  - Continue Eliquis and flecainide    3. Hypokalemia - Serum potasium is 3.2 on admission in setting of recent diarrhea  - Treated in ED with 40 mEq oral potassium  - Repeat chemistries in am     4. Hx of hypertension  - BP low on presentation, improved with 2 liters NS in ED  - Toprol and lisinopril held on admission given low pressures   5. Type II DM  - A1c was 6.2% last month  - Managed at home with metformin, held on admission  - Follow CBG's and start a low-intensity SSI with Novolog   DVT prophylaxis: Eliquis  Code Status: Full  Family Communication: Discussed with patient Disposition Plan: Admit to med-surg Consults called: None Admission status: Inpatient    Vianne Bulls, MD [facility] Pager [phone]  If 7PM-7AM, please contact night-coverage [email or web address] Password [facility]  10/28/2017, 9:16 PM

## 2017-10-28 NOTE — Patient Instructions (Addendum)
Patient being sent to ER for PNA, positive INfluenza A, dehydration, poor oral intake, diarrhea. He has also has diabetes and A fib He was stared on Tamiflu, amoxicillin, azithromycin 2/7 given IVF 2/7  Minimal improvement  Call report given to [facility] ED

## 2017-10-29 ENCOUNTER — Other Ambulatory Visit: Payer: Self-pay

## 2017-10-29 DIAGNOSIS — Z7901 Long term (current) use of anticoagulants: Secondary | ICD-10-CM | POA: Diagnosis not present

## 2017-10-29 DIAGNOSIS — I10 Essential (primary) hypertension: Secondary | ICD-10-CM | POA: Diagnosis present

## 2017-10-29 DIAGNOSIS — E861 Hypovolemia: Secondary | ICD-10-CM | POA: Diagnosis present

## 2017-10-29 DIAGNOSIS — E785 Hyperlipidemia, unspecified: Secondary | ICD-10-CM | POA: Diagnosis present

## 2017-10-29 DIAGNOSIS — J101 Influenza due to other identified influenza virus with other respiratory manifestations: Principal | ICD-10-CM

## 2017-10-29 DIAGNOSIS — Z880 Allergy status to penicillin: Secondary | ICD-10-CM | POA: Diagnosis not present

## 2017-10-29 DIAGNOSIS — E876 Hypokalemia: Secondary | ICD-10-CM | POA: Diagnosis present

## 2017-10-29 DIAGNOSIS — N179 Acute kidney failure, unspecified: Secondary | ICD-10-CM | POA: Diagnosis present

## 2017-10-29 DIAGNOSIS — E860 Dehydration: Secondary | ICD-10-CM | POA: Diagnosis present

## 2017-10-29 DIAGNOSIS — E872 Acidosis: Secondary | ICD-10-CM | POA: Diagnosis present

## 2017-10-29 DIAGNOSIS — I959 Hypotension, unspecified: Secondary | ICD-10-CM | POA: Diagnosis present

## 2017-10-29 DIAGNOSIS — Z87891 Personal history of nicotine dependence: Secondary | ICD-10-CM | POA: Diagnosis not present

## 2017-10-29 DIAGNOSIS — I4891 Unspecified atrial fibrillation: Secondary | ICD-10-CM | POA: Diagnosis present

## 2017-10-29 DIAGNOSIS — Z7984 Long term (current) use of oral hypoglycemic drugs: Secondary | ICD-10-CM | POA: Diagnosis not present

## 2017-10-29 DIAGNOSIS — Z96652 Presence of left artificial knee joint: Secondary | ICD-10-CM | POA: Diagnosis present

## 2017-10-29 DIAGNOSIS — E1140 Type 2 diabetes mellitus with diabetic neuropathy, unspecified: Secondary | ICD-10-CM | POA: Diagnosis present

## 2017-10-29 DIAGNOSIS — Z888 Allergy status to other drugs, medicaments and biological substances status: Secondary | ICD-10-CM | POA: Diagnosis not present

## 2017-10-29 DIAGNOSIS — Z974 Presence of external hearing-aid: Secondary | ICD-10-CM | POA: Diagnosis not present

## 2017-10-29 DIAGNOSIS — Z79899 Other long term (current) drug therapy: Secondary | ICD-10-CM | POA: Diagnosis not present

## 2017-10-29 LAB — BASIC METABOLIC PANEL
ANION GAP: 12 (ref 5–15)
BUN: 17 mg/dL (ref 6–20)
CHLORIDE: 108 mmol/L (ref 101–111)
CO2: 19 mmol/L — AB (ref 22–32)
Calcium: 7.9 mg/dL — ABNORMAL LOW (ref 8.9–10.3)
Creatinine, Ser: 1.28 mg/dL — ABNORMAL HIGH (ref 0.61–1.24)
GFR calc Af Amer: >60 mL/min (ref >60)
GFR, EST NON AFRICAN AMERICAN: 55 mL/min — AB (ref >60)
GLUCOSE: 83 mg/dL (ref 65–99)
POTASSIUM: 3.6 mmol/L (ref 3.5–5.1)
Sodium: 139 mmol/L (ref 135–145)

## 2017-10-29 LAB — CBC WITH DIFFERENTIAL/PLATELET
BASOS ABS: 0 10*3/uL (ref 0.0–0.1)
Basophils Relative: 0 %
Eosinophils Absolute: 0.1 10*3/uL (ref 0.0–0.7)
Eosinophils Relative: 1 %
HEMATOCRIT: 38 % — AB (ref 39.0–52.0)
HEMOGLOBIN: 12.5 g/dL — AB (ref 13.0–17.0)
LYMPHS PCT: 20 %
Lymphs Abs: 1.5 10*3/uL (ref 0.7–4.0)
MCH: 30.1 pg (ref 26.0–34.0)
MCHC: 32.9 g/dL (ref 30.0–36.0)
MCV: 91.6 fL (ref 78.0–100.0)
Monocytes Absolute: 1.2 10*3/uL — ABNORMAL HIGH (ref 0.1–1.0)
Monocytes Relative: 16 %
NEUTROS ABS: 4.8 10*3/uL (ref 1.7–7.7)
NEUTROS PCT: 63 %
PLATELETS: 140 10*3/uL — AB (ref 150–400)
RBC: 4.15 MIL/uL — AB (ref 4.22–5.81)
RDW: 13.6 % (ref 11.5–15.5)
WBC: 7.6 10*3/uL (ref 4.0–10.5)

## 2017-10-29 LAB — MAGNESIUM: Magnesium: 1.8 mg/dL (ref 1.7–2.4)

## 2017-10-29 LAB — GLUCOSE, CAPILLARY
GLUCOSE-CAPILLARY: 99 mg/dL (ref 65–99)
GLUCOSE-CAPILLARY: 104 mg/dL — AB (ref 65–99)
GLUCOSE-CAPILLARY: 129 mg/dL — AB (ref 65–99)
Glucose-Capillary: 69 mg/dL (ref 65–99)

## 2017-10-29 MED ORDER — SODIUM CHLORIDE 0.9 % IV SOLN
INTRAVENOUS | Status: DC
  Administered 2017-10-29 – 2017-10-30 (×3): via INTRAVENOUS

## 2017-10-29 NOTE — Progress Notes (Signed)
[facility]                                                                              Patient Demographics  Jimmy Bradshaw, is a 72 y.o. male, DOB - 06-Aug-1946, QIH:[id number]  Admit date - 10/28/2017   Admitting Physician Vianne Bulls, MD  Outpatient Primary MD for the patient is [place], Modena Nunnery, MD  Outpatient specialists:   LOS - 0  days    Chief Complaint  Patient presents with  . Pneumonia       Brief summary  TAOS TAPP is a 72 y.o. male with medical history significant for atrial fibrillation on Eliquis, hypertension, and type 2 diabetes mellitus, presenting with diarrhea and influenza-like symptoms with failed outpatient treatment of influenza A and suspected pneumonia with Tamiflu and antibiotic by PCP.  Patient was hypotensive and received IV fluid boluses in the ED with good response  Assessment & Plan    Principal Problem:   Influenza A Active Problems:   Type 2 diabetes mellitus with diabetic neuropathy, unspecified (HCC)   Atrial fibrillation (HCC)   Hypokalemia #1 reactive airway disease due to influenza A: Diagnosed influenza A on 10/27/2017 by PCP Chest x-ray on admission negative for airspace disease Patient dehydrated with hypovolemia on admission with good response to normal saline bolus and nebulized bronchodilators. Droplet precaution Supportive care with Tamiflu antibiotics and as needed nebulizers  #2 dehydration: Gentle IV rehydration with monitoring renal function and electrolytes  #3 acute kidney injury: IV rehydration Monitor renal function with electrolytes as well as acid base status  #4 mild metabolic acidosis: Due to diabetes #2 and 3 above Supportive care  #5 hypokalemia: Repleted  #6 diabetes mellitus type 2: Sliding-scale insulin Metformin held due to renal dysfunction-resume when renal function improves  Code Status: Full code DVT Prophylaxis:  Eliquis Family Communication: Discussed in detail  with the patient, all imaging results, lab results explained to the patient*   Disposition Plan: Home  Time Spent in minutes 36 minutes  Procedures:    Consultants:     Antimicrobials:      Medications  Scheduled Meds: . apixaban  5 mg Oral BID  . flecainide  50 mg Oral BID  . insulin aspart  0-5 Units Subcutaneous QHS  . insulin aspart  0-9 Units Subcutaneous TID WC  . oseltamivir  75 mg Oral BID  . rosuvastatin  10 mg Oral q1800   Continuous Infusions: PRN Meds:.acetaminophen **OR** acetaminophen, albuterol, bisacodyl, gabapentin, HYDROcodone-acetaminophen, ketorolac, ondansetron **OR** ondansetron (ZOFRAN) IV, senna-docusate, zolpidem   Antibiotics   Anti-infectives (From admission, onward)   Start     Dose/Rate Route Frequency Ordered Stop   10/28/17 2200  oseltamivir (TAMIFLU) capsule 75 mg     75 mg Oral 2 times daily 10/28/17 2115 11/01/17 8756        Subjective:   Manpreet Strey was seen and examined today.  Patient is feeling better.  Denies any fever or chills, no dizziness Objective:   Vitals:   10/28/17 2130 10/28/17 2145 10/28/17 2245 10/29/17 0539  BP: 112/70 109/71 114/72 102/65  Pulse: 73 72 73 65  Resp:  19 16 18 18   Temp:   98.4 F (36.9 C) 98.4 F (36.9 C)  TempSrc:    Oral  SpO2: 99% 98% 97% 96%  Weight:      Height:        Intake/Output Summary (Last 24 hours) at 10/29/2017 1308 Last data filed at 10/29/2017 0400 Gross per 24 hour  Intake 812.5 ml  Output -  Net 812.5 ml     Wt Readings from Last 3 Encounters:  10/28/17 98.9 kg (218 lb)  10/28/17 98.9 kg (218 lb)  10/27/17 98.9 kg (218 lb)     Exam  General: NAD  HEENT: NCAT,  PERRL, mildly dry MM  Neck: SUPPLE, (-) JVD  Cardiovascular: RRR, (-) GALLOP, (-) MURMUR  Respiratory: Few occasional bilateral wheezes, otherwise CTA  Gastrointestinal: SOFT, (-) DISTENSION, BS(+), (_) TENDERNESS  Ext: (-) CYANOSIS, (-) EDEMA  Neuro: A, OX 3  Skin:(-)  RASH  Psych:NORMAL AFFECT/MOOD   Data Reviewed:  I have personally reviewed following labs and imaging studies  Micro Results No results found for this or any previous visit (from the past 240 hour(s)).  Radiology Reports Dg Chest 2 View  Result Date: 10/28/2017 CLINICAL DATA:  Flu like symptoms for 2 days. EXAM: CHEST  2 VIEW COMPARISON:  01/19/2007 FINDINGS: Cardiac silhouette is normal in size and configuration. No mediastinal or hilar masses. No evidence of adenopathy. Lungs are clear. There is mild left lateral lung base pleural thickening. No pleural effusion or pneumothorax. Skeletal structures are demineralized but intact. IMPRESSION: No active cardiopulmonary disease. Electronically Signed   By: Lajean Manes M.D.   On: 10/28/2017 12:24    Lab Data:  CBC: Recent Labs  Lab 10/28/17 1158 10/29/17 0307  WBC 12.4* 7.6  NEUTROABS 9.2* 4.8  HGB 15.0 12.5*  HCT 43.8 38.0*  MCV 90.3 91.6  PLT 153 106*   Basic Metabolic Panel: Recent Labs  Lab 10/28/17 1158 10/29/17 0307  NA 135 139  K 3.4* 3.6  CL 101 108  CO2 21* 19*  GLUCOSE 138* 83  BUN 11 17  CREATININE 1.00 1.28*  CALCIUM 8.7* 7.9*  MG  --  1.8   GFR: Estimated Creatinine Clearance: 64.3 mL/min (A) (by C-G formula based on SCr of 1.28 mg/dL (H)). Liver Function Tests: Recent Labs  Lab 10/28/17 1158  AST 26  ALT 21  ALKPHOS 51  BILITOT 0.9  PROT 6.9  ALBUMIN 3.6   No results for input(s): LIPASE, AMYLASE in the last 168 hours. No results for input(s): AMMONIA in the last 168 hours. Coagulation Profile: No results for input(s): INR, PROTIME in the last 168 hours. Cardiac Enzymes: No results for input(s): CKTOTAL, CKMB, CKMBINDEX, TROPONINI in the last 168 hours. BNP (last 3 results) No results for input(s): PROBNP in the last 8760 hours. HbA1C: No results for input(s): HGBA1C in the last 72 hours. CBG: Recent Labs  Lab 10/28/17 2209 10/29/17 0813 10/29/17 1215  GLUCAP 72 69 99   Lipid  Profile: No results for input(s): CHOL, HDL, LDLCALC, TRIG, CHOLHDL, LDLDIRECT in the last 72 hours. Thyroid Function Tests: No results for input(s): TSH, T4TOTAL, FREET4, T3FREE, THYROIDAB in the last 72 hours. Anemia Panel: No results for input(s): VITAMINB12, FOLATE, FERRITIN, TIBC, IRON, RETICCTPCT in the last 72 hours. Urine analysis:    Component Value Date/Time   COLORURINE YELLOW 10/28/2017 1208   APPEARANCEUR HAZY (A) 10/28/2017 1208   APPEARANCEUR Clear 10/21/2016 1400   LABSPEC 1.012 10/28/2017 1208   PHURINE 6.0  10/28/2017 [place] 10/28/2017 1208   HGBUR SMALL (A) 10/28/2017 [place] 10/28/2017 1208   BILIRUBINUR Negative 10/21/2016 1400   KETONESUR 5 (A) 10/28/2017 1208   PROTEINUR 30 (A) 10/28/2017 1208   UROBILINOGEN 0.2 10/08/2014 1424   NITRITE NEGATIVE 10/28/2017 1208   LEUKOCYTESUR NEGATIVE 10/28/2017 1208   LEUKOCYTESUR Negative 10/21/2016 1400     OSEI-BONSU,Catilyn Boggus M.D. [facility] 10/29/2017, 1:08 PM  Pager: [phone] Between 7am to 7pm - call Pager - [phone]  After 7pm go to [email or web address] - password [email or web address]  Call night coverage person covering after 7pm

## 2017-10-30 ENCOUNTER — Other Ambulatory Visit: Payer: Self-pay

## 2017-10-30 LAB — BASIC METABOLIC PANEL
Anion gap: 10 (ref 5–15)
BUN: 11 mg/dL (ref 6–20)
CALCIUM: 8.2 mg/dL — AB (ref 8.9–10.3)
CO2: 24 mmol/L (ref 22–32)
CREATININE: 0.92 mg/dL (ref 0.61–1.24)
Chloride: 107 mmol/L (ref 101–111)
GFR calc Af Amer: >60 mL/min (ref >60)
GLUCOSE: 99 mg/dL (ref 65–99)
POTASSIUM: 3.5 mmol/L (ref 3.5–5.1)
SODIUM: 141 mmol/L (ref 135–145)

## 2017-10-30 LAB — CBC WITH DIFFERENTIAL/PLATELET
BASOS PCT: 0 %
Basophils Absolute: 0 10*3/uL (ref 0.0–0.1)
Eosinophils Absolute: 0.2 10*3/uL (ref 0.0–0.7)
Eosinophils Relative: 3 %
HEMATOCRIT: 38.3 % — AB (ref 39.0–52.0)
HEMOGLOBIN: 12.7 g/dL — AB (ref 13.0–17.0)
LYMPHS ABS: 1.4 10*3/uL (ref 0.7–4.0)
Lymphocytes Relative: 21 %
MCH: 30.6 pg (ref 26.0–34.0)
MCHC: 33.2 g/dL (ref 30.0–36.0)
MCV: 92.3 fL (ref 78.0–100.0)
MONOS PCT: 10 %
Monocytes Absolute: 0.7 10*3/uL (ref 0.1–1.0)
NEUTROS ABS: 4.2 10*3/uL (ref 1.7–7.7)
NEUTROS PCT: 66 %
Platelets: 167 10*3/uL (ref 150–400)
RBC: 4.15 MIL/uL — AB (ref 4.22–5.81)
RDW: 13.4 % (ref 11.5–15.5)
WBC: 6.4 10*3/uL (ref 4.0–10.5)

## 2017-10-30 LAB — GLUCOSE, CAPILLARY
GLUCOSE-CAPILLARY: 93 mg/dL (ref 65–99)
GLUCOSE-CAPILLARY: 109 mg/dL — AB (ref 65–99)

## 2017-10-30 NOTE — Discharge Summary (Signed)
Jimmy Bradshaw, is a 72 y.o. male  DOB 05/07/1946  MRN [id number].  Admission date:  10/28/2017  Admitting Physician  Vianne Bulls, MD  Discharge Date:  10/30/2017   Primary MD  Alycia Rossetti, MD  Recommendations for primary care physician for things to follow:   Follow-up and CBC and BMP  Admission Diagnosis  Influenza A [J10.1]   Discharge Diagnosis  Influenza A [J10.1]    Principal Problem:   Influenza A Active Problems:   Type 2 diabetes mellitus with diabetic neuropathy, unspecified (HCC)   Atrial fibrillation (HCC)   Hypokalemia   Dehydration      Past Medical History:  Diagnosis Date  . Anxiety   . Arthritis    HANDS,  SHOULDERS  . Bladder neck obstruction   . BPH (benign prostatic hypertrophy)   . Frequency of urination   . H/O: gout   . History of colon polyps   . Hyperlipidemia   . Nocturia   . Type 2 diabetes mellitus ([place])   . Urgency of urination   . Wears hearing aid    BILATERAL    Past Surgical History:  Procedure Laterality Date  . CARDIOVERSION N/A 10/26/2016   Procedure: CARDIOVERSION;  Surgeon: Larey Dresser, MD;  Location: [facility];  Service: Cardiovascular;  Laterality: N/A;  . COLONOSCOPY  08-30-2011  . I&D KNEE WITH POLY EXCHANGE Left 10/08/2014   Procedure: IRRIGATION AND DEBRIDEMENT LEFT UNI KNEE ARTHOPLASTY  WITH POLY EXCHANGE;  Surgeon: Mauri Pole, MD;  Location: [facility];  Service: Orthopedics;  Laterality: Left;  . KNEE ARTHROSCOPY Left 11/ 2013  . PARTIAL KNEE ARTHROPLASTY Left 02/05/2013   Procedure: LEFT KNEE MEDIAL UNICOMPARTMENTAL KNEE;  Surgeon: Mauri Pole, MD;  Location: [facility];  Service: Orthopedics;  Laterality: Left;  . TRANSURETHRAL RESECTION OF PROSTATE N/A 09/30/2014   Procedure: TRANSURETHRAL RESECTION OF THE PROSTATE WITH GYRUS INSTRUMENTS;  Surgeon: Bernestine Amass, MD;  Location: [facility];  Service:  Urology;  Laterality: N/A;       HPI  from the history and physical done on the day of admission:  Jimmy Bradshaw is a 72 y.o. male with medical history significant for atrial fibrillation on Eliquis, hypertension, and type 2 diabetes mellitus, now presenting to the emergency department for evaluation of fevers, chills, aches, cough, wheezing, and shortness of breath.  Patient reports that he began to develop these symptoms on 10/25/2017, worsened significantly on 10/27/2017, was evaluated by his PCP on that date, diagnosed with influenza A, treated with Tamiflu, empiric antibiotics for suspected pneumonia, and was given 1 L of normal saline in the clinic.  He was seen back in the clinic today for follow-up, appeared to be dehydrated and reported some diarrhea with the antibiotics.  He had ongoing shortness of breath and wheezing, appears acutely ill, and was directed to the ED.  ED Course: Upon arrival to the ED, patient is found to be afebrile, saturating adequately on room air, with initial blood pressure  of 89/69, and normal heart rate.  Chest x-ray is negative for acute cardiopulmonary disease, chemistry panel reveals a mild hypokalemia, and CBC is notable for a leukocytosis to 12,400.  Lactic acid is reassuringly normal.  Patient was given 2 L of normal saline, albuterol neb, and 40 mEq of oral potassium in the ED.  Blood pressure has improved, patient is dyspneic but not in acute distress, and he will be observed on the medical-surgical unit for ongoing evaluation and management of influenza A with dehydration and low blood pressures.         Hospital Course:    Jimmy Bradshaw a 72 y.o.malewith medical history significant foratrial fibrillation on Eliquis, hypertension, and type 2 diabetes mellitus, presenting with diarrhea and influenza-like symptoms with failed outpatient treatment of influenza A and suspected pneumonia with Tamiflu and antibiotic by PCP.  Patient was hypotensive and  received IV fluid boluses in the ED with good response.   #1 reactive airway disease due to influenza A: Diagnosed influenza A on 10/27/2017 by PCP Chest x-ray on admission negative for airspace disease Patient dehydrated with hypovolemia on admission with good response to normal saline bolus and nebulized bronchodilators. Droplet precaution Supportive care with Tamiflu antibiotics and as needed nebulizers Anti-infective treatment completed in the hospital No wheezing on exam today and no evidence of bronchospasms  #2 dehydration: Gentle IV rehydration with monitoring renal function and electrolytes Resolved  #3 acute kidney injury: IV rehydration Monitor renal function with electrolytes as well as acid base status  #4 mild metabolic acidosis: Due to diabetes #2 and 3 above Supportive care with gentle IV hydration Resolved  #5 hypokalemia: Repleted  #6 diabetes mellitus type 2: Sliding-scale insulin Metformin held due to renal dysfunction Resumed on discharge with resolution of renal dysfunction/metabolic acidosis            Discharge Condition: Improved and satisfactory  Follow UP-PCP in 1-2 weeks     Consults obtained -not applicable  Diet and Activity recommendation:  As advised  Discharge Instructions     Discharge Instructions    Call MD for:  difficulty breathing, headache or visual disturbances   Complete by:  As directed    Call MD for:  extreme fatigue   Complete by:  As directed    Call MD for:  hives   Complete by:  As directed    Call MD for:  persistant dizziness or light-headedness   Complete by:  As directed    Call MD for:  persistant nausea and vomiting   Complete by:  As directed    Call MD for:  redness, tenderness, or signs of infection (pain, swelling, redness, odor or green/yellow discharge around incision site)   Complete by:  As directed    Call MD for:  severe uncontrolled pain   Complete by:  As directed    Call MD  for:  temperature >100.4   Complete by:  As directed    Diet - low sodium heart healthy   Complete by:  As directed    Increase activity slowly   Complete by:  As directed         Discharge Medications     Allergies as of 10/30/2017      Reactions   Pravastatin Other (See Comments)   Headache and legs hurt   Penicillins Rash      Medication List    STOP taking these medications   azithromycin 250 MG tablet Commonly known as:  ZOXWRUEAV  oseltamivir 75 MG capsule Commonly known as:  TAMIFLU     TAKE these medications   ELIQUIS 5 MG Tabs tablet Generic drug:  apixaban TAKE 1 TABLET TWICE DAILY What changed:    how much to take  how to take this  when to take this   flecainide 50 MG tablet Commonly known as:  TAMBOCOR Take 1 tablet (50 mg total) by mouth 2 (two) times daily.   gabapentin 100 MG capsule Commonly known as:  NEURONTIN TAKE 1 TO 3 CAPSULES AT BEDTIME FOR NERVE PAIN What changed:    how much to take  how to take this  when to take this  reasons to take this  additional instructions   lisinopril 2.5 MG tablet Commonly known as:  PRINIVIL,ZESTRIL Take 1 tablet (2.5 mg total) by mouth daily.   metFORMIN 500 MG 24 hr tablet Commonly known as:  GLUCOPHAGE-XR TAKE 1 TABLET EVERY DAY WITH BREAKFAST What changed:    how much to take  how to take this  when to take this  additional instructions   metoprolol succinate 25 MG 24 hr tablet Commonly known as:  TOPROL-XL TAKE 1 TABLET (25 MG TOTAL) BY MOUTH DAILY.   rosuvastatin 10 MG tablet Commonly known as:  CRESTOR TAKE 1 TABLET EVERY DAY What changed:    how much to take  how to take this  when to take this       Major procedures and Radiology Reports - PLEASE review detailed and final reports for all details, in brief -      Dg Chest 2 View  Result Date: 10/28/2017 CLINICAL DATA:  Flu like symptoms for 2 days. EXAM: CHEST  2 VIEW COMPARISON:  01/19/2007 FINDINGS:  Cardiac silhouette is normal in size and configuration. No mediastinal or hilar masses. No evidence of adenopathy. Lungs are clear. There is mild left lateral lung base pleural thickening. No pleural effusion or pneumothorax. Skeletal structures are demineralized but intact. IMPRESSION: No active cardiopulmonary disease. Electronically Signed   By: Lajean Manes M.D.   On: 10/28/2017 12:24    Micro Results     No results found for this or any previous visit (from the past 240 hour(s)).     Today   Subjective    Jimmy Bradshaw today has no fever or chills, no chest pain, no shortness of breath, no cough is feeling very well and at baseline now and wants to go home.          Patient has been seen and examined prior to discharge   Objective   Blood pressure 123/61, pulse 74, temperature 98.6 F (37 C), temperature source Oral, resp. rate 16, height 5\' 11"  (1.803 m), weight 98.9 kg (218 lb), SpO2 97 %.   Intake/Output Summary (Last 24 hours) at 10/30/2017 1152 Last data filed at 10/30/2017 0631 Gross per 24 hour  Intake 2285.84 ml  Output 350 ml  Net 1935.84 ml    Exam Gen:- Awake, resting comfortably of oxygen supplementation HEENT:- [place].AT, MMM Neck-Supple Neck,No JVD,  Lungs-clear to auscultation bilaterally CV- S1, S2 normal Abd-  +ve B.Sounds, Abd Soft, No tenderness,    Extremity/Skin:- Intact peripheral pulses    Data Review   CBC w Diff:  Lab Results  Component Value Date   WBC 6.4 10/30/2017   HGB 12.7 (L) 10/30/2017   HGB 15.5 10/21/2016   HCT 38.3 (L) 10/30/2017   HCT 46.0 10/21/2016   PLT 167 10/30/2017   PLT 238 10/21/2016  LYMPHOPCT 21 10/30/2017   MONOPCT 10 10/30/2017   EOSPCT 3 10/30/2017   BASOPCT 0 10/30/2017    CMP:  Lab Results  Component Value Date   NA 141 10/30/2017   NA 142 10/19/2016   K 3.5 10/30/2017   CL 107 10/30/2017   CO2 24 10/30/2017   BUN 11 10/30/2017   BUN 28 (H) 10/19/2016   CREATININE 0.92 10/30/2017   CREATININE  1.20 (H) 10/11/2017   PROT 6.9 10/28/2017   ALBUMIN 3.6 10/28/2017   BILITOT 0.9 10/28/2017   ALKPHOS 51 10/28/2017   AST 26 10/28/2017   ALT 21 10/28/2017  .   Total Discharge time is about 33 minutes  OSEI-BONSU,Shawnya Mayor M.D on 10/30/2017 at 11:52 AM  [facility]   Office  [phone]  Dragon dictation system was used to create this note, attempts have been made to correct errors, however presence of uncorrected errors is not a reflection quality of care provided

## 2017-10-31 ENCOUNTER — Encounter: Payer: Self-pay | Admitting: Family Medicine

## 2017-10-31 MED ORDER — BENZONATATE 200 MG PO CAPS: 200.0000 mg | ORAL_CAPSULE | Freq: Two times a day (BID) | ORAL | 0 refills | Status: DC | PRN

## 2017-11-02 ENCOUNTER — Ambulatory Visit (INDEPENDENT_AMBULATORY_CARE_PROVIDER_SITE_OTHER): Payer: Medicare HMO | Admitting: Family Medicine

## 2017-11-02 ENCOUNTER — Other Ambulatory Visit: Payer: Self-pay

## 2017-11-02 ENCOUNTER — Encounter: Payer: Self-pay | Admitting: Family Medicine

## 2017-11-02 VITALS — BP 110/64 | HR 88 | Temp 98.4°F | Resp 16 | Ht 71.0 in | Wt 224.0 lb

## 2017-11-02 DIAGNOSIS — J111 Influenza due to unidentified influenza virus with other respiratory manifestations: Secondary | ICD-10-CM | POA: Diagnosis not present

## 2017-11-02 MED ORDER — GUAIFENESIN-CODEINE 100-10 MG/5ML PO SOLN: 5.0000 mL | Freq: Four times a day (QID) | ORAL | 0 refills | Status: DC | PRN

## 2017-11-02 NOTE — Progress Notes (Signed)
    Subjective:    Patient ID: Jimmy Bradshaw, male    DOB: 10/26/1945, 72 y.o.   MRN: [id number]  Patient presents for Hospital F/U (PNA/ Flu)  Pt here for hospital follow-up.  He was sent to the hospital last week after he was found to have influenza A as well as dehydration and probable pneumonia.  His chest x-ray did not show any pneumonia therefore his antibiotics were discontinued he was continued on Tamiflu and given IV fluids initially in the ER as he was hypotensive and then given continuous fluids for 2 days.  He continues to have some dry cough but overall feels much better.  He has not had any further fever his energy started to come back his appetite has improved.  No nausea no vomiting no diarrhea.  I did prescribe him Tessalon Perles a few days ago for the continued dry cough but this is not help.  He would like to try something a little stronger.  He has not had any wheezing or difficulty breathing  Reviewed hospital discharge as well as labs he had some mild renal insufficiency which resolved with fluid resuscitation.   Review Of Systems:  GEN- denies fatigue, fever, weight loss,weakness, recent illness HEENT- denies eye drainage, change in vision, nasal discharge, CVS- denies chest pain, palpitations RESP- denies SOB, +cough, wheeze ABD- denies N/V, change in stools, abd pain GU- denies dysuria, hematuria, dribbling, incontinence MSK- denies joint pain, muscle aches, injury Neuro- denies headache, dizziness, syncope, seizure activity       Objective:    BP 110/64   Pulse 88   Temp 98.4 F (36.9 C) (Oral)   Resp 16   Ht 5\' 11"  (1.803 m)   Wt 224 lb (101.6 kg)   SpO2 98%   BMI 31.24 kg/m  GEN- NAD, alert and oriented x3 HEENT- PERRL, EOMI, non injected sclera, pink conjunctiva, MMM, oropharynx clear Neck- Supple, no thyromegaly CVS- RRR, no murmur RESP-mild rhonchi right mid lung  no wheeze,normal WOB ABD-NABS,soft,NT,ND EXT- No edema Pulses- Radial   2+        Assessment & Plan:      Problem List Items Addressed This Visit    None    Visit Diagnoses    Influenza    -  Primary   Much improved, renal function back to baseline as well, continued cough as expected, given robitussin codeiene to try at bedtime, continue regular medications      Note: This dictation was prepared with Dragon dictation along with smaller phrase technology. Any transcriptional errors that result from this process are unintentional.

## 2017-11-02 NOTE — Patient Instructions (Signed)
F/U 4 months  

## 2017-11-02 NOTE — Consult Note (Signed)
            [facility] CM Primary Care Navigator  11/02/2017  Jimmy Bradshaw Feb 04, 1946 [id number]   Attempt to seepatient at the bedsideto identify possible discharge needs but he was alreadydischargedper staff report.   Per MD note, patient was directed from primary care provider's office to ED since he appeared to be dehydrated with diarrhea from the antibiotics; on going shortness of breath and wheezing.  Patient was discharged home on 2/10.  Primary care provider's officeis listed as providing transition of care (TOC).  Patient has discharge instruction to follow-up with primary care provider in 1-2 weeks after discharge.   For additional questions please contact:  Edwena Felty A. Kasim Mccorkle, BSN, RN-BC [facility] PRIMARY CARE Navigator Cell: [phone]

## 2017-11-09 ENCOUNTER — Encounter: Payer: Self-pay | Admitting: Family Medicine

## 2017-11-09 ENCOUNTER — Ambulatory Visit
Admission: RE | Admit: 2017-11-09 | Discharge: 2017-11-09 | Disposition: A | Discharge status: Home / Self Care | Payer: Medicare HMO | Source: Ambulatory Visit | Attending: Family Medicine | Admitting: Family Medicine

## 2017-11-09 ENCOUNTER — Inpatient Hospital Stay: Admission: RE | Admit: 2017-11-09 | Payer: Medicare HMO | Source: Ambulatory Visit

## 2017-11-09 DIAGNOSIS — H538 Other visual disturbances: Secondary | ICD-10-CM | POA: Diagnosis not present

## 2017-11-09 DIAGNOSIS — R1013 Epigastric pain: Secondary | ICD-10-CM

## 2017-11-09 DIAGNOSIS — R29818 Other symptoms and signs involving the nervous system: Secondary | ICD-10-CM

## 2017-11-09 DIAGNOSIS — R634 Abnormal weight loss: Secondary | ICD-10-CM

## 2017-11-09 DIAGNOSIS — H547 Unspecified visual loss: Secondary | ICD-10-CM

## 2017-11-09 DIAGNOSIS — R6881 Early satiety: Secondary | ICD-10-CM

## 2017-11-09 DIAGNOSIS — H579 Unspecified disorder of eye and adnexa: Secondary | ICD-10-CM

## 2017-11-09 MED ORDER — IOPAMIDOL (ISOVUE-300) INJECTION 61%
125.0000 mL | Freq: Once | INTRAVENOUS | Status: AC | PRN
  Administered 2017-11-09: 125 mL via INTRAVENOUS

## 2017-11-19 ENCOUNTER — Other Ambulatory Visit: Payer: Self-pay | Admitting: Family Medicine

## 2017-11-28 ENCOUNTER — Ambulatory Visit: Payer: Self-pay | Admitting: Family Medicine

## 2017-11-28 ENCOUNTER — Encounter: Payer: Self-pay | Admitting: Family Medicine

## 2017-11-29 ENCOUNTER — Ambulatory Visit: Payer: Medicare HMO | Admitting: Family Medicine

## 2017-11-29 ENCOUNTER — Ambulatory Visit (INDEPENDENT_AMBULATORY_CARE_PROVIDER_SITE_OTHER): Payer: Medicare HMO | Admitting: Family Medicine

## 2017-11-29 ENCOUNTER — Encounter: Payer: Self-pay | Admitting: Family Medicine

## 2017-11-29 VITALS — BP 118/74 | HR 94 | Temp 98.6°F | Resp 14 | Wt 220.0 lb

## 2017-11-29 DIAGNOSIS — M25512 Pain in left shoulder: Secondary | ICD-10-CM

## 2017-11-29 DIAGNOSIS — G8929 Other chronic pain: Secondary | ICD-10-CM | POA: Diagnosis not present

## 2017-11-29 NOTE — Progress Notes (Signed)
Subjective:    Patient ID: Jimmy Bradshaw, male    DOB: [birth year], 72 y.o.   MRN: [id number]  HPI  07/29/16 Patient has a history of an injury to his left shoulder more than 10 years ago when he slipped and fell. Since that time, he has had intermittent pain in his left shoulder. Recently he developed constant pain with abduction greater than 80. He has severe pain with empty can testing and with Hawkins testing. There've been no new falls or injuries. He has pain and aching throbbing pain in his shoulder when he tries to sleep. He has difficult time lifting his arm above his head and has a difficult time coming his hair or putting on shirts. He has obvious symptoms of impingement syndrome along with tendinitis in his rotator cuff. 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  At that time, my plan was:  I suspect that the patient has a partial tear in his suprasternal not as with underlying impingement syndrome and bursitis in his left shoulder. We discussed this at length. Prior to performing MRI, the patient would like to try cortisone shot. Using sterile technique, I injected the left shoulder with 2 mL of lidocaine, 2 mL of Marcaine, and 2 mL of 40 mg per mL Kenalog. The patient tolerated the procedure well without complication  8/32/54 Is here today for follow-up. He states the shot lasted a good 8 weeks but has slowly started to wear off and is now causing him significant pain again. He is requesting a repeat cortisone injection.  At that time, my plan was: We discussed the patient's options. I recommended an MRI to rule out a tear that could be correctable surgically. He would like to repeat cortisone injection at least one more time before proceeding with an MRI. If the pain returns, he agrees to the MRI at that point. 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 I'm not sure that he will be a candidate for surgical correction given the length of time since injury but I feel an MRI is warranted  prior to committing the patient to cortisone injections every 3 months. He would like to try one additional time. Using sterile technique, I injected the left shoulder with 2 mL of lidocaine, 2 mL of Marcaine, and 2 mL of 40 mg per mL Kenalog. He tolerated the procedure without complication  9/82/64 Patient is here today requesting a repeat cortisone injection. It has been 3 months since his last. Patient states that the shots helped him immensely. He is able to sleep better without pain. However after 3 months the pain becomes moderate to severe and is unable to lift his arm greater than 90. Last time we discussed an MRI of the shoulder. He is adamant that he would not want surgery at this point. We have not performed a basic x-ray and I suggested that we at least do that to rule out underlying skeletal abnormalities that may account for his problem. 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 He would be willing to proceed with an x-ray of the shoulder. However he would like to perform a cortisone injection today. I explained to the patient that repeat cortisone increases his risk of AVN of the shoulder joint. He is willing to proceed with the injection.  At that time, my plan was: I would like the patient to get a basic  x-ray of his left shoulder. Meanwhile I injected the left shoulder with 2 mL of lidocaine, 2 mL of Marcaine, and 2 mL of 40 mg per mL Kenalog. Patient tolerated procedure well without complication.  Also recommended physical therapy exercises to be performed with the Theraband that the patient could do at home including internal and external rotation of the shoulder and abduction of the shoulder  03/08/17 Patient returns today requesting a repeat injection in his shoulder. It is only been 2 months. The pain returned after only one month. As long as his arm is in a completely  dependent position, he is in no pain. As soon as he tries to lift his left shoulder a raise his arm over his head, the pain intensifies.  At that time, my plan was: I will not charge the patient for an office visit.  I am unable to perform an injection because it has not been 3 months. Furthermore, I'll proceed with an MRI of the shoulder. I do not feel comfortable continuing to repeatedly inject the shoulder without determining the exact etiology of the problem. Perhaps the patient will be a candidate for surgical correction.  After I obtain MRI, I will consult  orthopedics  04/07/17 MRI 7/10 revealed :  The patient has rotator cuff tendinopathy appearing worst in the supraspinatus. There is a tear of the anterior and far lateral supraspinatus measuring 0.6 cm from front to back. The tear has both full-thickness and undersurface component. There is no tendon retraction. The rotator cuff is otherwise intact  I recommended ortho consult.    He will not be able to see them until August 8. Furthermore he is deferring surgery until the fall. Therefore he is hopeful that we will be able to perform a cortisone injection today to help with his pain.  08/05/17 He has seen orthopedic surgery.  They have recommended physical therapy prior to any surgery.  Orthopedics also told the patient that he can receive a cortisone injection without causing further harm if necessary to control pain.  Patient is seen benefit from physical therapy however he continues to have severe pain in the shoulder particularly at night when he is trying to sleep and he would like a repeat cortisone injection today rather than having to pay a higher co-pay and see the orthopedist for the injection.  At that time, my plan was: Using sterile technique, I injected the left subacromial space with a mixture of 2 mL of lidocaine, 2 mL of Marcaine, and 2 mL of 40 mg per mL Kenalog. The patient tolerated the procedure well without  complication.  Patient will continue physical therapy and follow-up as planned with orthopedics.  11/29/17 Patient presents today requesting a repeat injection in his left shoulder.  He reports pain with abduction.  He reports pain with internal and external rotation.  Orthopedics is determined that the patient is stable to receive cortisone injections up to every 3 months as needed for pain in his shoulder until he cannot tolerate the pain any further and then he would be a candidate for shoulder surgery.  Therefore he is requesting a repeat cortisone injection Past Medical History:  Diagnosis Date  . Anxiety   . Arthritis    HANDS,  SHOULDERS  . Bladder neck obstruction   . BPH (benign prostatic hypertrophy)   . Frequency of urination   . H/O: gout   . History of colon polyps   . Hyperlipidemia   . Nocturia   . Type  2 diabetes mellitus ([place])   . Urgency of urination   . Wears hearing aid    BILATERAL   Past Surgical History:  Procedure Laterality Date  . CARDIOVERSION N/A 10/26/2016   Procedure: CARDIOVERSION;  Surgeon: Larey Dresser, MD;  Location: [place];  Service: Cardiovascular;  Laterality: N/A;  . COLONOSCOPY  08-30-2011  . I&D KNEE WITH POLY EXCHANGE Left 10/08/2014   Procedure: IRRIGATION AND DEBRIDEMENT LEFT UNI KNEE ARTHOPLASTY  WITH POLY EXCHANGE;  Surgeon: Mauri Pole, MD;  Location: [facility];  Service: Orthopedics;  Laterality: Left;  . KNEE ARTHROSCOPY Left 11/ 2013  . PARTIAL KNEE ARTHROPLASTY Left 02/05/2013   Procedure: LEFT KNEE MEDIAL UNICOMPARTMENTAL KNEE;  Surgeon: Mauri Pole, MD;  Location: [facility];  Service: Orthopedics;  Laterality: Left;  . TRANSURETHRAL RESECTION OF PROSTATE N/A 09/30/2014   Procedure: TRANSURETHRAL RESECTION OF THE PROSTATE WITH GYRUS INSTRUMENTS;  Surgeon: Bernestine Amass, MD;  Location: [facility];  Service: Urology;  Laterality: N/A;   Current Outpatient Medications on File Prior to Visit  Medication Sig Dispense  Refill  . benzonatate (TESSALON) 200 MG capsule Take 1 capsule (200 mg total) by mouth 2 (two) times daily as needed for cough. 20 capsule 0  . ELIQUIS 5 MG TABS tablet TAKE 1 TABLET TWICE DAILY (Patient taking differently: TAKE 5 mg  TABLET TWICE DAILY) 180 tablet 2  . flecainide (TAMBOCOR) 50 MG tablet Take 1 tablet (50 mg total) by mouth 2 (two) times daily. 180 tablet 3  . gabapentin (NEURONTIN) 100 MG capsule TAKE 1 TO 3 CAPSULES AT BEDTIME FOR NERVE PAIN (Patient taking differently: Take 200-300 mg by mouth at bedtime as needed. TAKE 1 TO 3 CAPSULES AT BEDTIME FOR NERVE PAIN) 90 capsule 2  . guaiFENesin-codeine 100-10 MG/5ML syrup Take 5 mLs by mouth every 6 (six) hours as needed. 180 mL 0  . lisinopril (PRINIVIL,ZESTRIL) 2.5 MG tablet TAKE 1 TABLET EVERY DAY 90 tablet 1  . metFORMIN (GLUCOPHAGE-XR) 500 MG 24 hr tablet TAKE 1 TABLET EVERY DAY WITH BREAKFAST 90 tablet 1  . metoprolol succinate (TOPROL-XL) 25 MG 24 hr tablet TAKE 1 TABLET (25 MG TOTAL) BY MOUTH DAILY. 90 tablet 3  . rosuvastatin (CRESTOR) 10 MG tablet TAKE 1 TABLET EVERY DAY (Patient taking differently: TAKE 10 mg  TABLET EVERY DAY) 90 tablet 3   No current facility-administered medications on file prior to visit.    Allergies  Allergen Reactions  . Pravastatin Other (See Comments)    Headache and legs hurt  . Penicillins Rash   Social History   Socioeconomic History  . Marital status: Married    Spouse name: Not on file  . Number of children: Not on file  . Years of education: Not on file  . Highest education level: Not on file  Social Needs  . Financial resource strain: Not on file  . Food insecurity - worry: Not on file  . Food insecurity - inability: Not on file  . Transportation needs - medical: Not on file  . Transportation needs - non-medical: Not on file  Occupational History  . Not on file  Tobacco Use  . Smoking status: Former Smoker    Years: 4.00    Types: Cigarettes    Last attempt to quit:  12/08/1998    Years since quitting: 18.9  . Smokeless tobacco: Never Used  Substance and Sexual Activity  . Alcohol use: Yes    Comment: RARE  . Drug use:  No  . Sexual activity: Not on file  Other Topics Concern  . Not on file  Social History Narrative  . Not on file      Review of Systems  All other systems reviewed and are negative.      Objective:   Physical Exam  Constitutional: He appears well-developed and well-nourished.  Eyes: Conjunctivae are normal. Pupils are equal, round, and reactive to light.  Cardiovascular: Normal rate, regular rhythm and normal heart sounds.  Pulmonary/Chest: Effort normal and breath sounds normal. No respiratory distress. He has no wheezes. He has no rales.  Musculoskeletal:       Left shoulder: He exhibits decreased range of motion, tenderness, pain and decreased strength.  Vitals reviewed. Internal rotation has improved.  External rotation has improved slightly.  Continues to have pain with abduction greater than 100 degrees and continues to have aching and throbbing pain in the shoulder at night when he tries to sleep       Assessment & Plan:  Chronic left shoulder pain  Using sterile technique, I injected the left subacromial space with a mixture of 2 mL of lidocaine, 2 mL of Marcaine, and 2 mL of 40 mg per mL Kenalog. The patient tolerated the procedure well without complication.

## 2017-12-29 ENCOUNTER — Other Ambulatory Visit: Payer: Self-pay | Admitting: Family Medicine

## 2018-01-03 DIAGNOSIS — D487 Neoplasm of uncertain behavior of other specified sites: Secondary | ICD-10-CM | POA: Diagnosis not present

## 2018-01-03 DIAGNOSIS — H5319 Other subjective visual disturbances: Secondary | ICD-10-CM | POA: Diagnosis not present

## 2018-01-04 DIAGNOSIS — H01021 Squamous blepharitis right upper eyelid: Secondary | ICD-10-CM | POA: Diagnosis not present

## 2018-01-04 DIAGNOSIS — H2513 Age-related nuclear cataract, bilateral: Secondary | ICD-10-CM | POA: Diagnosis not present

## 2018-01-04 DIAGNOSIS — H11132 Conjunctival pigmentations, left eye: Secondary | ICD-10-CM | POA: Diagnosis not present

## 2018-01-04 DIAGNOSIS — H01025 Squamous blepharitis left lower eyelid: Secondary | ICD-10-CM | POA: Diagnosis not present

## 2018-01-04 DIAGNOSIS — H01024 Squamous blepharitis left upper eyelid: Secondary | ICD-10-CM | POA: Diagnosis not present

## 2018-01-04 DIAGNOSIS — H35373 Puckering of macula, bilateral: Secondary | ICD-10-CM | POA: Diagnosis not present

## 2018-01-04 DIAGNOSIS — H01022 Squamous blepharitis right lower eyelid: Secondary | ICD-10-CM | POA: Diagnosis not present

## 2018-01-04 DIAGNOSIS — H35353 Cystoid macular degeneration, bilateral: Secondary | ICD-10-CM | POA: Diagnosis not present

## 2018-01-23 DIAGNOSIS — H35373 Puckering of macula, bilateral: Secondary | ICD-10-CM | POA: Diagnosis not present

## 2018-01-23 DIAGNOSIS — D3132 Benign neoplasm of left choroid: Secondary | ICD-10-CM | POA: Diagnosis not present

## 2018-01-23 DIAGNOSIS — H43822 Vitreomacular adhesion, left eye: Secondary | ICD-10-CM | POA: Diagnosis not present

## 2018-02-01 DIAGNOSIS — H43822 Vitreomacular adhesion, left eye: Secondary | ICD-10-CM | POA: Diagnosis not present

## 2018-02-01 DIAGNOSIS — H35372 Puckering of macula, left eye: Secondary | ICD-10-CM | POA: Diagnosis not present

## 2018-02-07 IMAGING — MR MR SHOULDER*L* W/O CM
4 of 5 series · 19 of 40 positions shown · non-contrast
Comparison: Plain films left shoulder 03/29/2017.

CLINICAL DATA: Intermittent left shoulder pain since a slip and
fall 10 years ago. No recent injury.

EXAM:
MRI OF THE LEFT SHOULDER WITHOUT CONTRAST
TECHNIQUE: Multiplanar, multisequence MR imaging of the shoulder was performed.
No intravenous contrast was administered.

[Series 3: T2 fat-sat · axial · 4.0mm · 0.23mm/px · z∈[-17,+72]mm · 5 of 23 slices shown (1 of 3)]
[im 1/23]
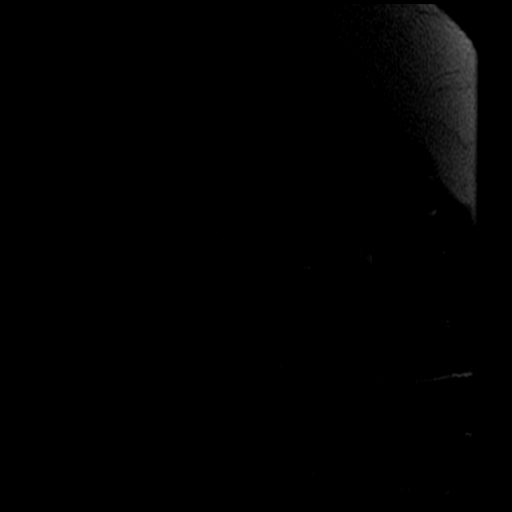
[im 4/23]
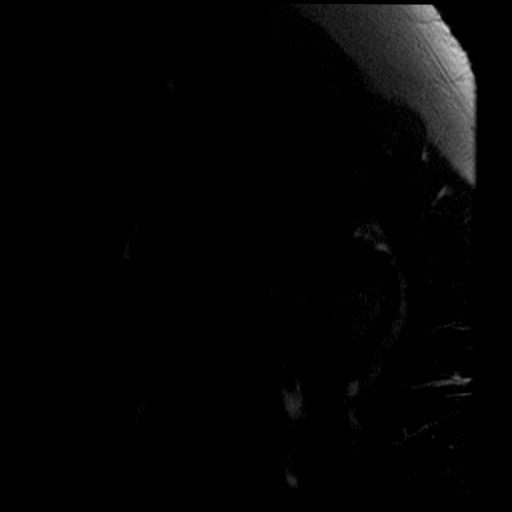
[im 7/23]
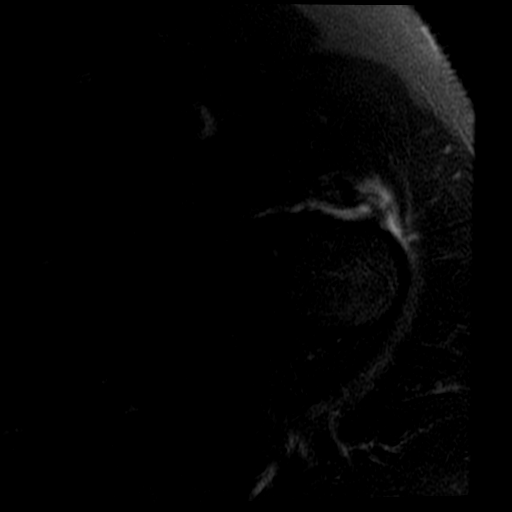
[im 13/23]
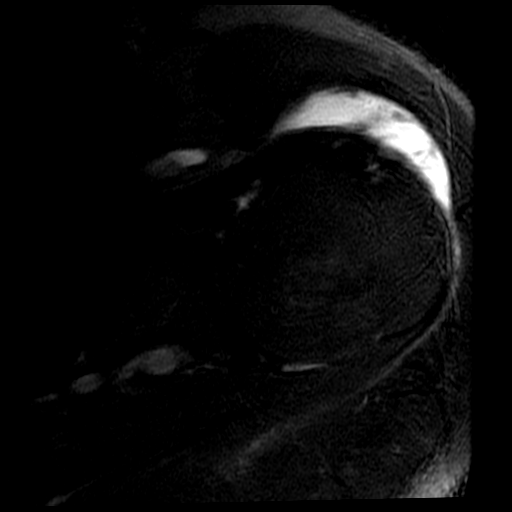
[im 19/23]
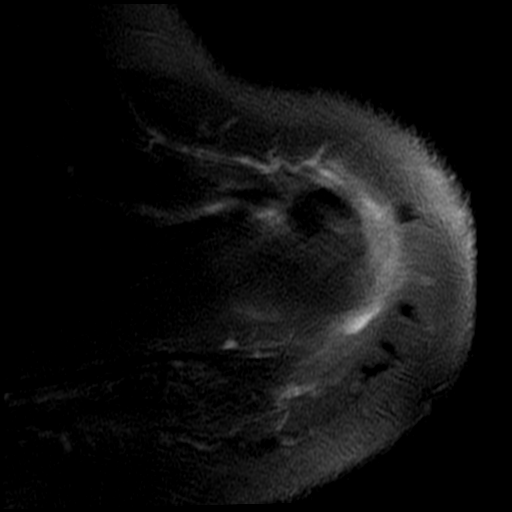

[Series 4: T2 fat-sat · sagittal · 4.0mm · 0.29mm/px · 3 of 19 slices shown (2 of 3)]
[im 3/19]
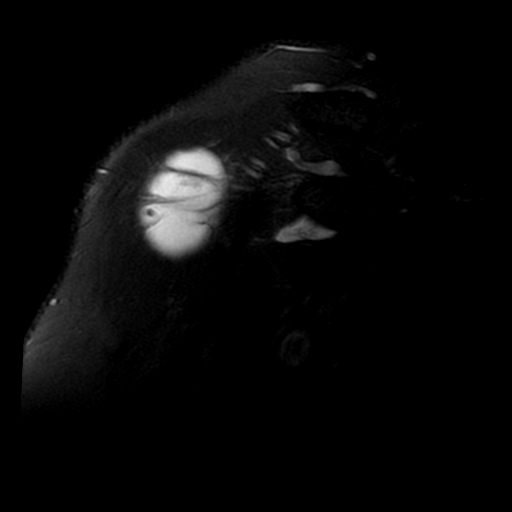
[im 11/19]
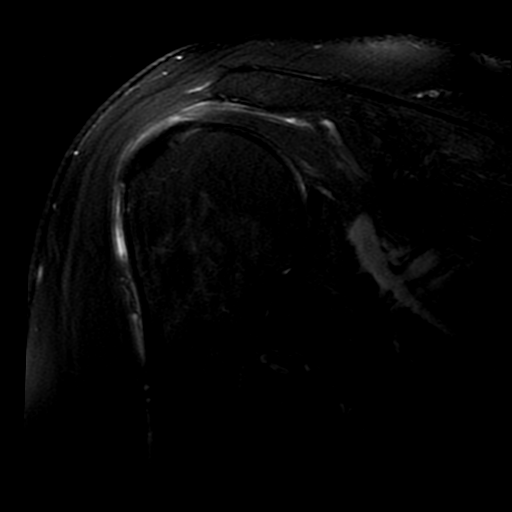
[im 16/19]
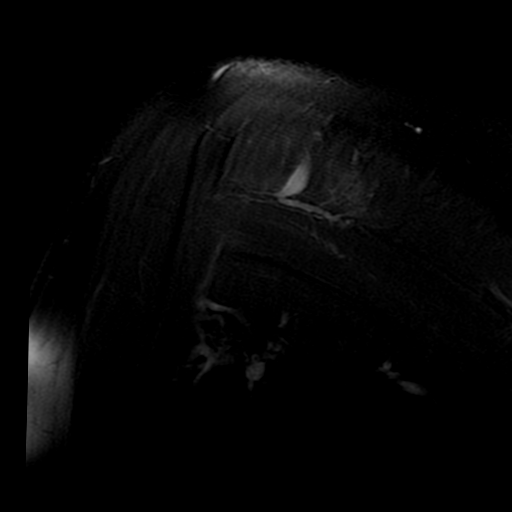

[Series 5: PD · sagittal · 4.0mm · 0.29mm/px · 8 of 19 slices shown]
[im 1/19]
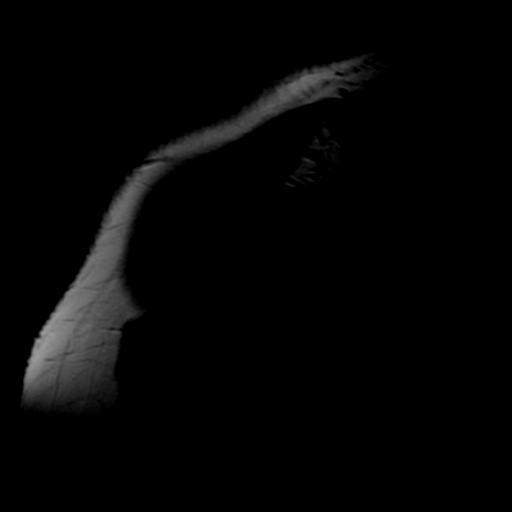
[im 3/19]
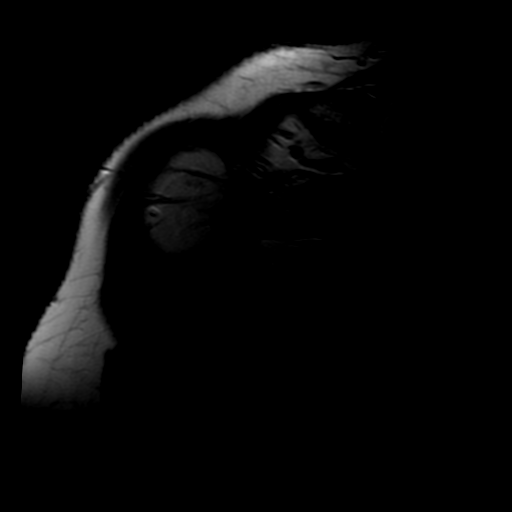
[im 6/19]
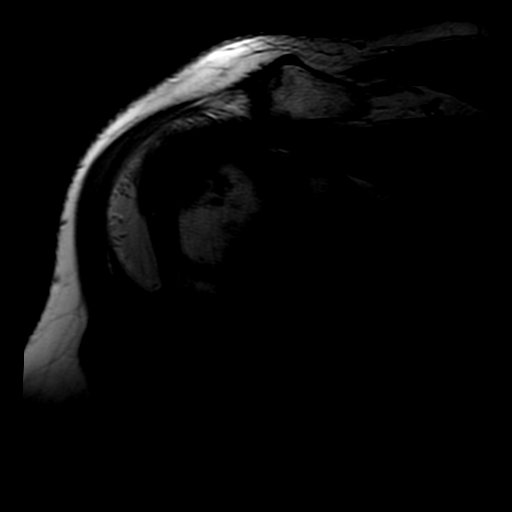
[im 8/19]
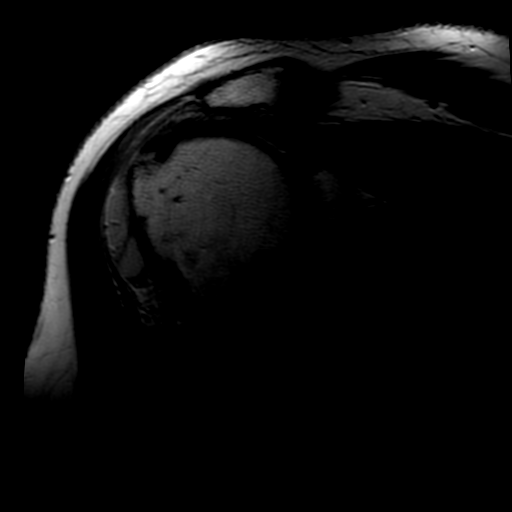
[im 11/19]
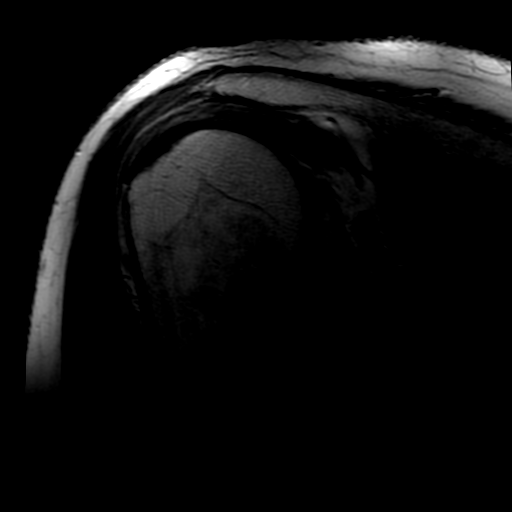
[im 13/19]
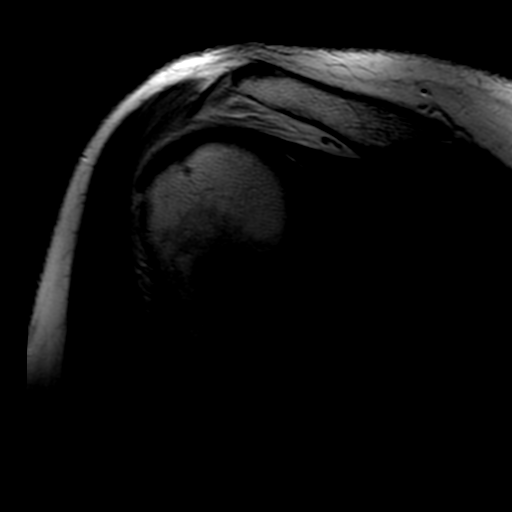
[im 16/19]
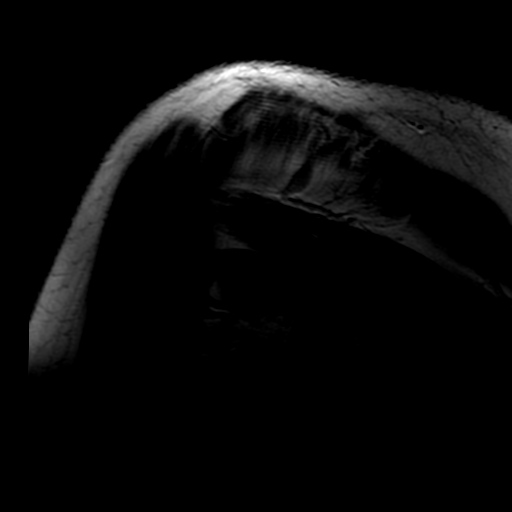
[im 19/19]
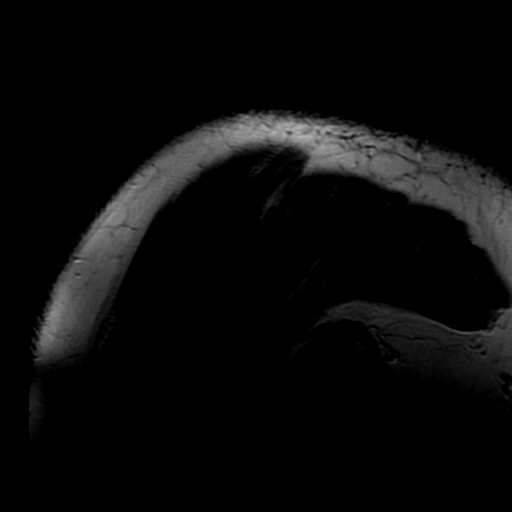

[Series 7: T2 fat-sat · coronal · 4.0mm · 0.29mm/px · 3 of 19 slices shown (3 of 3)]
[im 3/19]
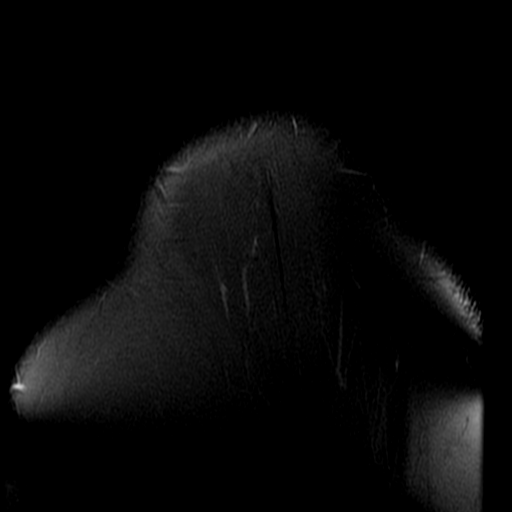
[im 11/19]
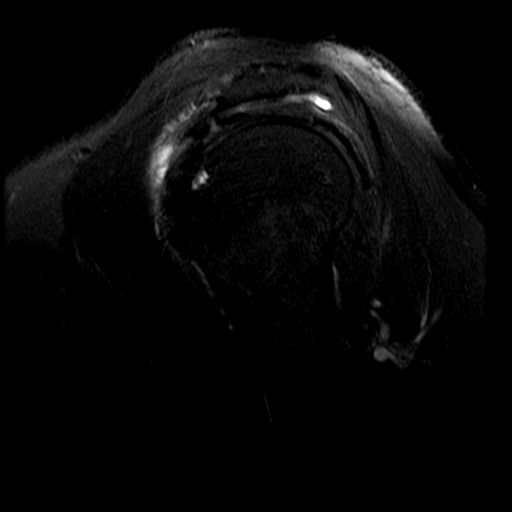
[im 16/19]
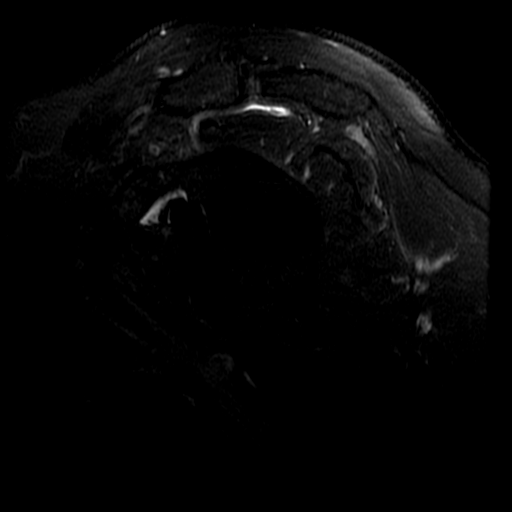

[19 of 40 positions shown; findings below may reference images not displayed]

FINDINGS: Rotator cuff: The patient has rotator cuff tendinopathy appearing
worst in the supraspinatus. There is a tear of the anterior and far
lateral supraspinatus measuring 0.6 cm from front to back. The tear
has both full-thickness and undersurface component. There is no
tendon retraction. The rotator cuff is otherwise intact.

Muscles:  No focal atrophy or lesion.

Biceps long head:  Intact and normal in appearance.

Acromioclavicular Joint: Moderate degenerative change is seen. Type
1 acromion. There is a large volume of fluid in the
subacromial/subdeltoid bursa with some debris present. Fluid is
mainly in the anterior aspect of the bursa.

Glenohumeral Joint: Negative.

Labrum:  Intact.

Bones:  No fracture or worrisome lesion.

Other: None.
IMPRESSION: Rotator cuff tendinopathy with a 0.6 cm from front to back tear of
the anterior and far lateral supraspinatus. The tear status post
full-thickness and undersurface components. No retraction or
atrophy.

Large volume of subacromial/subdeltoid fluid consistent with
bursitis.

Moderate acromioclavicular osteoarthritis.

## 2018-02-09 DIAGNOSIS — Z09 Encounter for follow-up examination after completed treatment for conditions other than malignant neoplasm: Secondary | ICD-10-CM | POA: Diagnosis not present

## 2018-02-09 DIAGNOSIS — H35372 Puckering of macula, left eye: Secondary | ICD-10-CM | POA: Diagnosis not present

## 2018-02-23 ENCOUNTER — Encounter: Payer: Self-pay | Admitting: Family Medicine

## 2018-02-23 ENCOUNTER — Ambulatory Visit (INDEPENDENT_AMBULATORY_CARE_PROVIDER_SITE_OTHER): Payer: Medicare HMO | Admitting: Family Medicine

## 2018-02-23 VITALS — BP 110/60 | HR 68 | Temp 99.1°F | Resp 16 | Ht 71.0 in | Wt 224.0 lb

## 2018-02-23 DIAGNOSIS — M25512 Pain in left shoulder: Secondary | ICD-10-CM | POA: Diagnosis not present

## 2018-02-23 DIAGNOSIS — G8929 Other chronic pain: Secondary | ICD-10-CM | POA: Diagnosis not present

## 2018-02-23 NOTE — Progress Notes (Signed)
Subjective:    Patient ID: Jimmy Bradshaw, male    DOB: [birth year], 72 y.o.   MRN: [id number]  HPI  07/29/16 Patient has a history of an injury to his left shoulder more than 10 years ago when he slipped and fell. Since that time, he has had intermittent pain in his left shoulder. Recently he developed constant pain with abduction greater than 80. He has severe pain with empty can testing and with Hawkins testing. There've been no new falls or injuries. He has pain and aching throbbing pain in his shoulder when he tries to sleep. He has difficult time lifting his arm above his head and has a difficult time coming his hair or putting on shirts. He has obvious symptoms of impingement syndrome along with tendinitis in his rotator cuff. 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  At that time, my plan was:  I suspect that the patient has a partial tear in his suprasternal not as with underlying impingement syndrome and bursitis in his left shoulder. We discussed this at length. Prior to performing MRI, the patient would like to try cortisone shot. Using sterile technique, I injected the left shoulder with 2 mL of lidocaine, 2 mL of Marcaine, and 2 mL of 40 mg per mL Kenalog. The patient tolerated the procedure well without complication  8/32/54 Is here today for follow-up. He states the shot lasted a good 8 weeks but has slowly started to wear off and is now causing him significant pain again. He is requesting a repeat cortisone injection.  At that time, my plan was: We discussed the patient's options. I recommended an MRI to rule out a tear that could be correctable surgically. He would like to repeat cortisone injection at least one more time before proceeding with an MRI. If the pain returns, he agrees to the MRI at that point. 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 I'm not sure that he will be a candidate for surgical correction given the length of time since injury but I feel an MRI is warranted  prior to committing the patient to cortisone injections every 3 months. He would like to try one additional time. Using sterile technique, I injected the left shoulder with 2 mL of lidocaine, 2 mL of Marcaine, and 2 mL of 40 mg per mL Kenalog. He tolerated the procedure without complication  9/82/64 Patient is here today requesting a repeat cortisone injection. It has been 3 months since his last. Patient states that the shots helped him immensely. He is able to sleep better without pain. However after 3 months the pain becomes moderate to severe and is unable to lift his arm greater than 90. Last time we discussed an MRI of the shoulder. He is adamant that he would not want surgery at this point. We have not performed a basic x-ray and I suggested that we at least do that to rule out underlying skeletal abnormalities that may account for his problem. 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 He would be willing to proceed with an x-ray of the shoulder. However he would like to perform a cortisone injection today. I explained to the patient that repeat cortisone increases his risk of AVN of the shoulder joint. He is willing to proceed with the injection.  At that time, my plan was: I would like the patient to get a basic  x-ray of his left shoulder. Meanwhile I injected the left shoulder with 2 mL of lidocaine, 2 mL of Marcaine, and 2 mL of 40 mg per mL Kenalog. Patient tolerated procedure well without complication.  Also recommended physical therapy exercises to be performed with the Theraband that the patient could do at home including internal and external rotation of the shoulder and abduction of the shoulder  03/08/17 Patient returns today requesting a repeat injection in his shoulder. It is only been 2 months. The pain returned after only one month. As long as his arm is in a completely  dependent position, he is in no pain. As soon as he tries to lift his left shoulder a raise his arm over his head, the pain intensifies.  At that time, my plan was: I will not charge the patient for an office visit.  I am unable to perform an injection because it has not been 3 months. Furthermore, I'll proceed with an MRI of the shoulder. I do not feel comfortable continuing to repeatedly inject the shoulder without determining the exact etiology of the problem. Perhaps the patient will be a candidate for surgical correction.  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 There is a tear of the anterior and far lateral supraspinatus measuring 0.6 cm from front to back. The tear has both full-thickness and undersurface component. There is no tendon retraction. The rotator cuff is otherwise intact  I recommended ortho consult.    He will not be able to see them until August 8. Furthermore he is deferring surgery until the fall. Therefore he is hopeful that we will be able to perform a cortisone injection today to help with his pain.  08/05/17 He has seen orthopedic surgery.  They have recommended physical therapy prior to any surgery.  Orthopedics also told the patient that he can receive a cortisone injection without causing further harm if necessary to control pain.  Patient is seen benefit from physical therapy however he continues to have severe pain in the shoulder particularly at night when he is trying to sleep and he would like a repeat cortisone injection today rather than having to pay a higher co-pay and see the orthopedist for the injection.  At that time, my plan was: Using sterile technique, I injected the left subacromial space with a mixture of 2 mL of lidocaine, 2 mL of Marcaine, and 2 mL of 40 mg per mL Kenalog. The patient tolerated the procedure well without  complication.  Patient will continue physical therapy and follow-up as planned with orthopedics.  11/29/17 Patient presents today requesting a repeat injection in his left shoulder.  He reports pain with abduction.  He reports pain with internal and external rotation.  Orthopedics is determined that the patient is stable to receive cortisone injections up to every 3 months as needed for pain in his shoulder until he cannot tolerate the pain any further and then he would be a candidate for shoulder surgery.  Therefore he is requesting a repeat cortisone injection.  At that time, my plan was: Using sterile technique, I injected the left subacromial space with a mixture of 2 mL of lidocaine, 2 mL of Marcaine, and 2 mL of 40 mg per mL Kenalog. The patient tolerated the procedure well without complication.    02/23/18 Patient is here today requesting a repeat injection of cortisone in his  left shoulder.  He continues to have pain with abduction greater than 90 to 100 degrees as well as internal and external rotation.  He continues to decline any surgical intervention.  Physical therapy provided minimal relief.  However he see substantial benefit from cortisone injections.  He usually gets 3 months of pain-free range of motion after a cortisone injection into the subacromial space.  He is willing to accept the risk and asked me to repeat the injection today. Past Medical History:  Diagnosis Date  . Anxiety   . Arthritis    HANDS,  SHOULDERS  . Bladder neck obstruction   . BPH (benign prostatic hypertrophy)   . Frequency of urination   . H/O: gout   . History of colon polyps   . Hyperlipidemia   . Nocturia   . Type 2 diabetes mellitus ([place])   . Urgency of urination   . Wears hearing aid    BILATERAL   Past Surgical History:  Procedure Laterality Date  . CARDIOVERSION N/A 10/26/2016   Procedure: CARDIOVERSION;  Surgeon: Larey Dresser, MD;  Location: [facility];  Service: Cardiovascular;   Laterality: N/A;  . COLONOSCOPY  08-30-2011  . I&D KNEE WITH POLY EXCHANGE Left 10/08/2014   Procedure: IRRIGATION AND DEBRIDEMENT LEFT UNI KNEE ARTHOPLASTY  WITH POLY EXCHANGE;  Surgeon: Mauri Pole, MD;  Location: [facility];  Service: Orthopedics;  Laterality: Left;  . KNEE ARTHROSCOPY Left 11/ 2013  . PARTIAL KNEE ARTHROPLASTY Left 02/05/2013   Procedure: LEFT KNEE MEDIAL UNICOMPARTMENTAL KNEE;  Surgeon: Mauri Pole, MD;  Location: [facility];  Service: Orthopedics;  Laterality: Left;  . TRANSURETHRAL RESECTION OF PROSTATE N/A 09/30/2014   Procedure: TRANSURETHRAL RESECTION OF THE PROSTATE WITH GYRUS INSTRUMENTS;  Surgeon: Bernestine Amass, MD;  Location: [facility];  Service: Urology;  Laterality: N/A;   Current Outpatient Medications on File Prior to Visit  Medication Sig Dispense Refill  . benzonatate (TESSALON) 200 MG capsule Take 1 capsule (200 mg total) by mouth 2 (two) times daily as needed for cough. 20 capsule 0  . ELIQUIS 5 MG TABS tablet TAKE 1 TABLET TWICE DAILY (Patient taking differently: TAKE 5 mg  TABLET TWICE DAILY) 180 tablet 2  . flecainide (TAMBOCOR) 50 MG tablet Take 1 tablet (50 mg total) by mouth 2 (two) times daily. 180 tablet 3  . gabapentin (NEURONTIN) 100 MG capsule Take 2-3 capsules (200-300 mg total) by mouth at bedtime as needed. TAKE 1 TO 3 CAPSULES AT BEDTIME FOR NERVE PAIN 90 capsule 3  . guaiFENesin-codeine 100-10 MG/5ML syrup Take 5 mLs by mouth every 6 (six) hours as needed. 180 mL 0  . lisinopril (PRINIVIL,ZESTRIL) 2.5 MG tablet TAKE 1 TABLET EVERY DAY 90 tablet 1  . metFORMIN (GLUCOPHAGE-XR) 500 MG 24 hr tablet TAKE 1 TABLET EVERY DAY WITH BREAKFAST 90 tablet 1  . metoprolol succinate (TOPROL-XL) 25 MG 24 hr tablet TAKE 1 TABLET (25 MG TOTAL) BY MOUTH DAILY. 90 tablet 3  . rosuvastatin (CRESTOR) 10 MG tablet TAKE 1 TABLET EVERY DAY (Patient taking differently: TAKE 10 mg  TABLET EVERY DAY) 90 tablet 3   No current facility-administered  medications on file prior to visit.    Allergies  Allergen Reactions  . Pravastatin Other (See Comments)    Headache and legs hurt  . Penicillins Rash   Social History   Socioeconomic History  . Marital status: Married    Spouse name: Not on file  . Number of children: Not  on file  . Years of education: Not on file  . Highest education level: Not on file  Occupational History  . Not on file  Social Needs  . Financial resource strain: Not on file  . Food insecurity:    Worry: Not on file    Inability: Not on file  . Transportation needs:    Medical: Not on file    Non-medical: Not on file  Tobacco Use  . Smoking status: Former Smoker    Years: 4.00    Types: Cigarettes    Last attempt to quit: 12/08/1998    Years since quitting: 19.2  . Smokeless tobacco: Never Used  Substance and Sexual Activity  . Alcohol use: Yes    Comment: RARE  . Drug use: No  . Sexual activity: Not on file  Lifestyle  . Physical activity:    Days per week: Not on file    Minutes per session: Not on file  . Stress: Not on file  Relationships  . Social connections:    Talks on phone: Not on file    Gets together: Not on file    Attends religious service: Not on file    Active member of club or organization: Not on file    Attends meetings of clubs or organizations: Not on file    Relationship status: Not on file  . Intimate partner violence:    Fear of current or ex partner: Not on file    Emotionally abused: Not on file    Physically abused: Not on file    Forced sexual activity: Not on file  Other Topics Concern  . Not on file  Social History Narrative  . Not on file      Review of Systems  All other systems reviewed and are negative.      Objective:   Physical Exam  Constitutional: He appears well-developed and well-nourished.  Eyes: Pupils are equal, round, and reactive to light. Conjunctivae are normal.  Cardiovascular: Normal rate, regular rhythm and normal heart  sounds.  Pulmonary/Chest: Effort normal and breath sounds normal. No respiratory distress. He has no wheezes. He has no rales.  Musculoskeletal:       Left shoulder: He exhibits decreased range of motion, tenderness, pain and decreased strength.  Vitals reviewed.   Continues to have pain with abduction greater than 100 degrees and continues to have aching and throbbing pain in the shoulder at night when he tries to sleep       Assessment & Plan:  Chronic left shoulder pain  Using sterile technique, I injected the patient's left shoulder with a mixture of 2 cc lidocaine, 2 cc Marcaine, and 2 cc of 40 mg/mL Kenalog.  The patient tolerated the procedure well without complication.

## 2018-03-03 ENCOUNTER — Encounter: Payer: Self-pay | Admitting: Family Medicine

## 2018-03-03 ENCOUNTER — Ambulatory Visit (INDEPENDENT_AMBULATORY_CARE_PROVIDER_SITE_OTHER): Payer: Medicare HMO | Admitting: Family Medicine

## 2018-03-03 ENCOUNTER — Ambulatory Visit: Payer: Medicare HMO | Admitting: Family Medicine

## 2018-03-03 ENCOUNTER — Other Ambulatory Visit: Payer: Self-pay

## 2018-03-03 VITALS — BP 118/68 | HR 86 | Temp 98.1°F | Resp 14 | Ht 71.0 in | Wt 218.0 lb

## 2018-03-03 DIAGNOSIS — G8929 Other chronic pain: Secondary | ICD-10-CM

## 2018-03-03 DIAGNOSIS — I10 Essential (primary) hypertension: Secondary | ICD-10-CM

## 2018-03-03 DIAGNOSIS — E1149 Type 2 diabetes mellitus with other diabetic neurological complication: Secondary | ICD-10-CM | POA: Diagnosis not present

## 2018-03-03 DIAGNOSIS — S299XXA Unspecified injury of thorax, initial encounter: Secondary | ICD-10-CM

## 2018-03-03 DIAGNOSIS — E7800 Pure hypercholesterolemia, unspecified: Secondary | ICD-10-CM | POA: Diagnosis not present

## 2018-03-03 DIAGNOSIS — L84 Corns and callosities: Secondary | ICD-10-CM | POA: Diagnosis not present

## 2018-03-03 DIAGNOSIS — E1140 Type 2 diabetes mellitus with diabetic neuropathy, unspecified: Secondary | ICD-10-CM | POA: Diagnosis not present

## 2018-03-03 DIAGNOSIS — M25512 Pain in left shoulder: Secondary | ICD-10-CM

## 2018-03-03 DIAGNOSIS — E669 Obesity, unspecified: Secondary | ICD-10-CM

## 2018-03-03 NOTE — Progress Notes (Signed)
   Subjective:    Patient ID: Jimmy Bradshaw, male    DOB: 1946/05/04, 72 y.o.   MRN: [id number]  Patient presents for Follow-up (is not fasting) and S/P fall  Pt here to f/u chronic medical problems  DM- last A1C 6.2% in jan, currently taking metformin 500mg  once a day   Was under the car Monday , trying to fix something, thought a stick was dragging and when he got up, stumbled forward, he hit under right axilla side wall , boxes and trube No Bruising    He has been getting Left cortisone injections for left shoulder pain, has not seen ortho again    Review Of Systems:  GEN- denies fatigue, fever, weight loss,weakness, recent illness HEENT- denies eye drainage, change in vision, nasal discharge, CVS- denies chest pain, palpitations RESP- denies SOB, cough, wheeze ABD- denies N/V, change in stools, abd pain GU- denies dysuria, hematuria, dribbling, incontinence MSK-+joint pain, muscle aches, injury Neuro- denies headache, dizziness, syncope, seizure activity       Objective:    BP 118/68   Pulse 86   Temp 98.1 F (36.7 C) (Oral)   Resp 14   Ht 5\' 11"  (1.803 m)   Wt 218 lb (98.9 kg)   SpO2 98%   BMI 30.40 kg/m  GEN- NAD, alert and oriented x3 HEENT- PERRL, EOMI, non injected sclera, pink conjunctiva, MMM, oropharynx clear Neck- Supple, no thyromegaly CVS- RRR, no murmur RESP-CTAB ABD-NABS,soft,NT,ND Chest wall- ribs NT, no bruising, mild TPP chest wall  msk- Fair ROM bilat shoulder, UE biceps in tact  EXT- No edema Pulses- Radial, DP- 2+        Assessment & Plan:      Problem List Items Addressed This Visit      Unprioritized   Chronic left shoulder pain    With him getting recurrent injections into the shoulder.  I would like to have him see orthopedics to make sure that we do not do any harm I continue to give him cortisone into the same region.  He can schedule follow-up appointment.      Diabetic neuropathy ([place]) - Primary   Relevant Orders   Hemoglobin A1c (Completed)   HM DIABETES FOOT EXAM (Completed)   HTN (hypertension)    Blood pressure is well controlled no change in medications.      Relevant Orders   CBC with Differential/Platelet (Completed)   Comprehensive metabolic panel (Completed)   Hyperlipidemia   Relevant Orders   Lipid panel (Completed)   Obesity (BMI 30.0-34.9)   Type 2 diabetes mellitus with diabetic neuropathy, unspecified (HCC)    Check A1c not bring his meter with him today to review fasting blood sugars. His goal for less than 7% as long as there is no hypoglycemia symptoms      Relevant Orders   CBC with Differential/Platelet (Completed)   Comprehensive metabolic panel (Completed)   Hemoglobin A1c (Completed)   HM DIABETES FOOT EXAM (Completed)    Other Visit Diagnoses    Callus of foot       Can use corn/callus pad   Injury of chest wall, initial encounter       more contustion, no meds needed, no bruising, normal respirations, should resolve without intervention       Note: This dictation was prepared with Dragon dictation along with smaller phrase technology. Any transcriptional errors that result from this process are unintentional.

## 2018-03-03 NOTE — Patient Instructions (Addendum)
Schedule visit with orthopedics Continue current medications F/U November

## 2018-03-04 LAB — CBC WITH DIFFERENTIAL/PLATELET
BASOS PCT: 0.3 %
Basophils Absolute: 32 cells/uL (ref 0–200)
EOS PCT: 0.7 %
Eosinophils Absolute: 74 cells/uL (ref 15–500)
HEMATOCRIT: 45 % (ref 38.5–50.0)
Hemoglobin: 15.2 g/dL (ref 13.2–17.1)
LYMPHS ABS: 1481 {cells}/uL (ref 850–3900)
MCH: 30.3 pg (ref 27.0–33.0)
MCHC: 33.8 g/dL (ref 32.0–36.0)
MCV: 89.6 fL (ref 80.0–100.0)
MPV: 11.4 fL (ref 7.5–12.5)
Monocytes Relative: 9.1 %
NEUTROS ABS: 7959 {cells}/uL — AB (ref 1500–7800)
NEUTROS PCT: 75.8 %
Platelets: 249 10*3/uL (ref 140–400)
RBC: 5.02 10*6/uL (ref 4.20–5.80)
RDW: 13.4 % (ref 11.0–15.0)
Total Lymphocyte: 14.1 %
WBC: 10.5 10*3/uL (ref 3.8–10.8)
WBCMIX: 956 {cells}/uL — AB (ref 200–950)

## 2018-03-04 LAB — COMPREHENSIVE METABOLIC PANEL
AG Ratio: 2 (calc) (ref 1.0–2.5)
ALBUMIN MSPROF: 4.5 g/dL (ref 3.6–5.1)
ALT: 20 U/L (ref 9–46)
AST: 17 U/L (ref 10–35)
Alkaline phosphatase (APISO): 58 U/L (ref 40–115)
BILIRUBIN TOTAL: 0.8 mg/dL (ref 0.2–1.2)
BUN: 25 mg/dL (ref 7–25)
CHLORIDE: 106 mmol/L (ref 98–110)
CO2: 27 mmol/L (ref 20–32)
CREATININE: 1.13 mg/dL (ref 0.70–1.18)
Calcium: 10 mg/dL (ref 8.6–10.3)
GLOBULIN: 2.3 g/dL (ref 1.9–3.7)
Glucose, Bld: 109 mg/dL — ABNORMAL HIGH (ref 65–99)
POTASSIUM: 4.8 mmol/L (ref 3.5–5.3)
SODIUM: 141 mmol/L (ref 135–146)
TOTAL PROTEIN: 6.8 g/dL (ref 6.1–8.1)

## 2018-03-04 LAB — HEMOGLOBIN A1C
EAG (MMOL/L): 7.1 (calc)
Hgb A1c MFr Bld: 6.1 % of total Hgb — ABNORMAL HIGH (ref <5.7)
Mean Plasma Glucose: 128 (calc)

## 2018-03-04 LAB — LIPID PANEL
CHOL/HDL RATIO: 3.4 (calc) (ref <5.0)
Cholesterol: 141 mg/dL (ref <200)
HDL: 41 mg/dL (ref >40)
LDL CHOLESTEROL (CALC): 78 mg/dL
Non-HDL Cholesterol (Calc): 100 mg/dL (calc) (ref <130)
Triglycerides: 127 mg/dL (ref <150)

## 2018-03-06 ENCOUNTER — Encounter: Payer: Self-pay | Admitting: Family Medicine

## 2018-03-06 NOTE — Assessment & Plan Note (Addendum)
Check A1c not bring his meter with him today to review fasting blood sugars. His goal for less than 7% as long as there is no hypoglycemia symptoms

## 2018-03-06 NOTE — Assessment & Plan Note (Signed)
Blood pressure is well controlled no change in medications.

## 2018-03-06 NOTE — Assessment & Plan Note (Signed)
With him getting recurrent injections into the shoulder.  I would like to have him see orthopedics to make sure that we do not do any harm I continue to give him cortisone into the same region.  He can schedule follow-up appointment.

## 2018-03-09 ENCOUNTER — Encounter: Payer: Self-pay | Admitting: *Deleted

## 2018-03-11 ENCOUNTER — Other Ambulatory Visit: Payer: Self-pay | Admitting: Family Medicine

## 2018-03-20 ENCOUNTER — Encounter: Payer: Self-pay | Admitting: Cardiovascular Disease

## 2018-03-20 ENCOUNTER — Ambulatory Visit: Payer: Medicare HMO | Admitting: Cardiovascular Disease

## 2018-03-20 VITALS — BP 116/72 | HR 62 | Ht 71.0 in | Wt 221.1 lb

## 2018-03-20 DIAGNOSIS — I10 Essential (primary) hypertension: Secondary | ICD-10-CM

## 2018-03-20 DIAGNOSIS — I480 Paroxysmal atrial fibrillation: Secondary | ICD-10-CM

## 2018-03-20 NOTE — Patient Instructions (Signed)
Medication Instructions:  Your physician recommends that you continue on your current medications as directed. Please refer to the Current Medication list given to you today.   Labwork: None Ordered   Testing/Procedures: None Ordered   Follow-Up: Your physician wants you to follow-up in: 1 year with Dr. Nahser.  You will receive a reminder letter in the mail two months in advance. If you don't receive a letter, please call our office to schedule the follow-up appointment.   If you need a refill on your cardiac medications before your next appointment, please call your pharmacy.   Thank you for choosing [facility]! Emiley Digiacomo, RN [phone]    

## 2018-03-20 NOTE — Progress Notes (Signed)
Patient ID: Jimmy Bradshaw, male   DOB: April 12, 1946, 72 y.o.   MRN: [id number]    Date:  03/20/2018   ID:  Jimmy Bradshaw, DOB 14-Mar-1946, MRN [id number]  PCP:  Alycia Rossetti, MD  Primary Cardiologist:  Carron Mcmurry  Chief Complaint  Patient presents with  . Atrial Fibrillation   Problem list: 1. Paroxysmal atrial fibrillation  - CHADS2VASC score of 3  ( age, HTN, DM)  2. Hypertension 3. Hyperlipidemia 4. Diabetes mellitus 5. BPH - s/p TURP 6. S/p left TKA  - with supsequent infection of his prosthesis requiring re-do surgery .     Jimmy Bradshaw is a 72 y.o. male history of hypertension, hyperlipidemia, type 2 diabetes mellitus, anxiety, BPH. On 09/30/2014 he underwent TURP procedure and then a week later underwent irrigation and debridement of the left knee.  PICC line was placed and he was started on antibiotics for 4 weeks.  One of these surgeries patient supposedly went into atrial fibrillation with a rapid ventricular response. There are no EKGs or telemetry strips which confirmed this nor is there any documentation in the notes; either in the discharge summaries or with anesthesia.   The patient currently denies nausea, vomiting, fever, chest pain, shortness of breath, orthopnea, dizziness, PND, cough, congestion, abdominal pain, hematochezia, melena, lower extremity edema, claudication.   Feb. 19, 2016   He was getting his echo today and went in to rapid afib.  Still asymptomatic.  Was added on to my schedule.   November 21, 2014:  Jimmy Bradshaw is seen back today for follow up of his paroxysmal atrial fib.    We started him on Xarelto . Unfortunately, he had some bleeding in his urine .  He had just had recent prostate surgery ( [name]) .   He held his Xarelto for the next week.  He is doing better.   March 11, 2015: Jimmy Bradshaw has a hx of paroxysmal atrial fib. Has Eliquis but has not started it yet  His hematura has resolved.  Checks his pulse and has not had recurrent atrial fib to his  knowledge   Doing well.  Is having some leg pain.    Thinks it may be due to the pravachol  Still having knee problems .    Jan. 17, 2017:  Jimmy Bradshaw is doing well He has paroxysmal afib  - is back in Afib today - cannot tell that his HR is irregular.  No CP , no dyspnea.  He is not having any further urinary bleeding . His knee infection has resolved.   Jan. 30, 2018:  Jimmy Bradshaw is seen for visit  Dr. Buelah Manis increased the Toprol XL to 50 mg a day   January 10, 2017:  Jimmy Bradshaw is seen today for follow up of his atrial fib.  He had a cardioversion Feb. 6, 2018: GXT  Feb. 13, 2018 showed mild widening of his QRS with peak exercise. We decreased his flecainide from 100 mg twice a day to 50 mg twice a day at that point. In on Flecainide 50 mg BID , metoprolol 25 mg a day and Eliquis  Feels well Left knee replacement is working well   Dec. 19, 2018:  No CP or dysnea.   Maintaining  NSR  Is watching his diet.   Walks with dogs.    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  Having some vision issues.   Saw Dr. Sherrilyn Rist ( at [facility]) Being referred to [facility] for Economy eval   March 20, 2018:  He was seen today for follow-up of his paroxysmal atrial fibrillation, HTN, HLD .  Marland Kitchen  He has maintained normal sinus rhythm.  He is on flecainide 50 mg twice a day. Has been having some issues with his vision.  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    Monitoring for now  No CP , no dyspnea,   Exercising  Running dogs in agility training .  Bearded collies.      Wt Readings from Last 3 Encounters:  03/20/18 221 lb 1.9 oz (100.3 kg)  03/03/18 218 lb (98.9 kg)  02/23/18 224 lb (101.6 kg)     Past Medical History:  Diagnosis Date  . Anxiety   . Arthritis    HANDS,  SHOULDERS  . Bladder neck obstruction   . BPH (benign prostatic hypertrophy)   . Frequency of urination   . H/O: gout   . History of colon polyps   . Hyperlipidemia   . Nocturia   . Type 2 diabetes mellitus ([place])   .  Urgency of urination   . Wears hearing aid    BILATERAL    Current Outpatient Medications  Medication Sig Dispense Refill  . apixaban (ELIQUIS) 5 MG TABS tablet Take 5 mg by mouth 2 (two) times daily.    . flecainide (TAMBOCOR) 50 MG tablet Take 1 tablet (50 mg total) by mouth 2 (two) times daily. 180 tablet 3  . gabapentin (NEURONTIN) 100 MG capsule Take 2-3 capsules (200-300 mg total) by mouth at bedtime as needed. TAKE 1 TO 3 CAPSULES AT BEDTIME FOR NERVE PAIN 90 capsule 3  . lisinopril (PRINIVIL,ZESTRIL) 2.5 MG tablet Take 2.5 mg by mouth daily.    . metFORMIN (GLUCOPHAGE) 500 MG tablet Take 500 mg by mouth daily.    . metoprolol succinate (TOPROL-XL) 25 MG 24 hr tablet TAKE 1 TABLET (25 MG TOTAL) BY MOUTH DAILY. 90 tablet 3  . rosuvastatin (CRESTOR) 10 MG tablet Take 10 mg by mouth daily.     No current facility-administered medications for this visit.     Allergies:    Allergies  Allergen Reactions  . Pravastatin Other (See Comments)    Headache and legs hurt  . Penicillins Rash    Social History:  The patient  reports that he quit smoking about 19 years ago. His smoking use included cigarettes. He quit after 4.00 years of use. He has never used smokeless tobacco. He reports that he drinks alcohol. He reports that he does not use drugs.   Family history:   Family History  Adopted: Yes  Problem Relation Age of Onset  . Colon cancer Father     ROS:  Please see the history of present illness.  All other systems reviewed and negative.   Physical Exam: Blood pressure 116/72, pulse 62, height 5\' 11"  (1.803 m), weight 221 lb 1.9 oz (100.3 kg), SpO2 97 %.  GEN:  Well nourished, well developed in no acute distress HEENT: Normal NECK: No JVD; No carotid bruits LYMPHATICS: No lymphadenopathy CARDIAC: RR  RESPIRATORY:  Clear to auscultation without rales, wheezing or rhonchi  ABDOMEN: Soft, non-tender, non-distended MUSCULOSKELETAL:  No edema; No deformity  SKIN: Warm and  dry NEUROLOGIC:  Alert and oriented x 3   EKG:   March 20, 2018: Normal sinus rhythm at 62 beats a minute.  Normal EKG.   ASSESSMENT AND PLAN:  1. Paroxysmal Atrial fibrillation:  CHADS 2VASC score of 3 (  Age, HTN, DM)   . He is maintained normal sinus rhythm.  Continue Eliquis.  2. Essential hypertension:   -Blood pressure is well controlled.  3. Hyperlipidemia:    Lipids look great.  Managed by primary care  4.  Diabetes mellitus-    Ramond Dial., MD, [facility] 03/20/2018, 8:44 AM [address]  [place] Pager [phone]

## 2018-03-22 ENCOUNTER — Other Ambulatory Visit: Payer: Self-pay | Admitting: Cardiovascular Disease

## 2018-03-27 DIAGNOSIS — H35372 Puckering of macula, left eye: Secondary | ICD-10-CM | POA: Diagnosis not present

## 2018-03-27 DIAGNOSIS — Z09 Encounter for follow-up examination after completed treatment for conditions other than malignant neoplasm: Secondary | ICD-10-CM | POA: Diagnosis not present

## 2018-04-10 ENCOUNTER — Other Ambulatory Visit: Payer: Self-pay | Admitting: Family Medicine

## 2018-04-10 DIAGNOSIS — H35373 Puckering of macula, bilateral: Secondary | ICD-10-CM | POA: Diagnosis not present

## 2018-04-10 DIAGNOSIS — H01025 Squamous blepharitis left lower eyelid: Secondary | ICD-10-CM | POA: Diagnosis not present

## 2018-04-10 DIAGNOSIS — H01022 Squamous blepharitis right lower eyelid: Secondary | ICD-10-CM | POA: Diagnosis not present

## 2018-04-10 DIAGNOSIS — H2513 Age-related nuclear cataract, bilateral: Secondary | ICD-10-CM | POA: Diagnosis not present

## 2018-04-10 DIAGNOSIS — H35353 Cystoid macular degeneration, bilateral: Secondary | ICD-10-CM | POA: Diagnosis not present

## 2018-04-10 DIAGNOSIS — H01021 Squamous blepharitis right upper eyelid: Secondary | ICD-10-CM | POA: Diagnosis not present

## 2018-04-10 DIAGNOSIS — H01024 Squamous blepharitis left upper eyelid: Secondary | ICD-10-CM | POA: Diagnosis not present

## 2018-04-26 ENCOUNTER — Ambulatory Visit (INDEPENDENT_AMBULATORY_CARE_PROVIDER_SITE_OTHER): Payer: Medicare HMO | Admitting: Family Medicine

## 2018-04-26 ENCOUNTER — Encounter: Payer: Self-pay | Admitting: Family Medicine

## 2018-04-26 VITALS — BP 142/80 | HR 62 | Temp 98.5°F | Resp 16 | Ht 71.0 in | Wt 222.0 lb

## 2018-04-26 DIAGNOSIS — E1140 Type 2 diabetes mellitus with diabetic neuropathy, unspecified: Secondary | ICD-10-CM

## 2018-04-26 DIAGNOSIS — Z0000 Encounter for general adult medical examination without abnormal findings: Secondary | ICD-10-CM | POA: Diagnosis not present

## 2018-04-26 LAB — GLUCOSE 16585: GLUCOSE 2500: 129 mg/dL — AB (ref 65–99)

## 2018-04-26 NOTE — Progress Notes (Signed)
Subjective:   Jimmy Bradshaw is a 72 y.o. male who presents for Medicare Annual/Subsequent preventive examination.    Review of Systems:   Review of Systems  Constitutional: Negative.  Negative for activity change, appetite change, fatigue and unexpected weight change.  HENT: Negative.   Eyes: Negative.   Respiratory: Negative.  Negative for cough, chest tightness and shortness of breath.   Cardiovascular: Negative.  Negative for chest pain, palpitations and leg swelling.  Gastrointestinal: Negative.  Negative for abdominal pain and blood in stool.  Endocrine: Negative.   Genitourinary: Negative.  Negative for decreased urine volume, difficulty urinating, testicular pain and urgency.  Musculoskeletal: Negative.   Skin: Negative.  Negative for color change and pallor.  Allergic/Immunologic: Negative.   Neurological: Negative.  Negative for dizziness, syncope, weakness, light-headedness and numbness.  Hematological: Negative.   Psychiatric/Behavioral: Negative.  Negative for confusion, dysphoric mood, self-injury and suicidal ideas. The patient is not nervous/anxious.   All other systems reviewed and are negative.  Cardiac Risk Factors include: advanced age (>43men, >55 women);diabetes mellitus;hypertension;obesity (BMI >30kg/m2);male gender;dyslipidemia     Objective:    Vitals: BP (!) 142/80   Pulse 62   Temp 98.5 F (36.9 C) (Oral)   Resp 16   Ht 5\' 11"  (1.803 m)   Wt 222 lb (100.7 kg)   SpO2 96%   BMI 30.96 kg/m   Body mass index is 30.96 kg/m.  Advanced Directives 04/26/2018 10/29/2017 10/28/2017 10/26/2016 10/08/2014 10/08/2014 10/07/2014  Does Patient Have a Medical Advance Directive? Yes Yes Yes Yes Yes Yes Yes  Type of Paramedic of [place];Living will [place];Living will [place];Living will [place];Living will Living will Living will Living will  Does patient want to make changes to  medical advance directive? No - Patient declined No - Patient declined - - No - Patient declined - -  Copy of [place] in Chart? Yes No - copy requested - Yes No - copy requested No - copy requested No - copy requested  Pre-existing out of facility DNR order (yellow form or pink MOST form) - - - - - - -    Tobacco Social History   Tobacco Use  Smoking Status Former Smoker  . Years: 4.00  . Types: Cigarettes  . Last attempt to quit: 12/08/1998  . Years since quitting: 19.3  Smokeless Tobacco Never Used     Counseling given: Not Answered   Clinical Intake:     Pain : 0-10 Pain Score: 1  Pain Type: Acute pain Pain Location: Foot Pain Orientation: Left Pain Descriptors / Indicators: Aching Pain Onset: In the past 7 days Pain Frequency: Intermittent Pain Relieving Factors: avoid red meat, holding still  Pain Relieving Factors: avoid red meat, holding still  BMI - recorded: 31 Nutritional Status: BMI > 30  Obese Nutritional Risks: None Diabetes: Yes CBG done?: Yes Did pt. bring in CBG monitor from home?: No Patient has well-managed diabetes, takes metformin only and does not check sugars at home usually gets checked only in the doctor's office.  Results for orders placed or performed in visit on 04/26/18  GLUCOSE [id number]  Result Value Ref Range   Glucose 129 (H) 65 - 99 mg/dL             Past Medical History:  Diagnosis Date  . Anxiety   . Arthritis    HANDS,  SHOULDERS  . Bladder neck obstruction   .  BPH (benign prostatic hypertrophy)   . Dehydration 10/29/2017  . Frequency of urination   . H/O: gout   . History of colon polyps   . Hyperlipidemia   . Hypokalemia 10/28/2017  . Nocturia   . Type 2 diabetes mellitus ([place])   . Urgency of urination   . Wears hearing aid    BILATERAL   Past Surgical History:  Procedure Laterality Date  . CARDIOVERSION N/A 10/26/2016   Procedure: CARDIOVERSION;  Surgeon: Larey Dresser, MD;  Location: [facility];  Service: Cardiovascular;  Laterality: N/A;  . COLONOSCOPY  08-30-2011  . I&D KNEE WITH POLY EXCHANGE Left 10/08/2014   Procedure: IRRIGATION AND DEBRIDEMENT LEFT UNI KNEE ARTHOPLASTY  WITH POLY EXCHANGE;  Surgeon: Mauri Pole, MD;  Location: [facility];  Service: Orthopedics;  Laterality: Left;  . KNEE ARTHROSCOPY Left 11/ 2013  . PARTIAL KNEE ARTHROPLASTY Left 02/05/2013   Procedure: LEFT KNEE MEDIAL UNICOMPARTMENTAL KNEE;  Surgeon: Mauri Pole, MD;  Location: [facility];  Service: Orthopedics;  Laterality: Left;  . TRANSURETHRAL RESECTION OF PROSTATE N/A 09/30/2014   Procedure: TRANSURETHRAL RESECTION OF THE PROSTATE WITH GYRUS INSTRUMENTS;  Surgeon: Bernestine Amass, MD;  Location: [facility];  Service: Urology;  Laterality: N/A;   Family History  Adopted: Yes  Problem Relation Age of Onset  . Colon cancer Father    Social History   Socioeconomic History  . Marital status: Married    Spouse name: Not on file  . Number of children: Not on file  . Years of education: Not on file  . Highest education level: Not on file  Occupational History  . Not on file  Social Needs  . Financial resource strain: Not on file  . Food insecurity:    Worry: Not on file    Inability: Not on file  . Transportation needs:    Medical: Not on file    Non-medical: Not on file  Tobacco Use  . Smoking status: Former Smoker    Years: 4.00    Types: Cigarettes    Last attempt to quit: 12/08/1998    Years since quitting: 19.3  . Smokeless tobacco: Never Used  Substance and Sexual Activity  . Alcohol use: Yes    Alcohol/week: 0.6 oz    Types: 1 Glasses of wine per week    Comment: RARE - a sip, occassional  . Drug use: No  . Sexual activity: Not Currently  Lifestyle  . Physical activity:    Days per week: 0 days    Minutes per session: 0 min  . Stress: Not on file  Relationships  . Social connections:    Talks on phone: Not on file    Gets together: Not on file     Attends religious service: Not on file    Active member of club or organization: Not on file    Attends meetings of clubs or organizations: Not on file    Relationship status: Not on file  Other Topics Concern  . Not on file  Social History Narrative  . Not on file    Outpatient Encounter Medications as of 04/26/2018  Medication Sig  . apixaban (ELIQUIS) 5 MG TABS tablet Take 5 mg by mouth 2 (two) times daily.  . flecainide (TAMBOCOR) 50 MG tablet Take 1 tablet (50 mg total) by mouth 2 (two) times daily.  Marland Kitchen gabapentin (NEURONTIN) 100 MG capsule Take 2-3 capsules (200-300 mg total) by mouth at bedtime as needed. TAKE  1 TO 3 CAPSULES AT BEDTIME FOR NERVE PAIN  . lisinopril (PRINIVIL,ZESTRIL) 2.5 MG tablet Take 2.5 mg by mouth daily.  . metFORMIN (GLUCOPHAGE) 500 MG tablet Take 500 mg by mouth daily.  . metoprolol succinate (TOPROL-XL) 25 MG 24 hr tablet TAKE 1 TABLET (25 MG TOTAL) BY MOUTH DAILY.  . rosuvastatin (CRESTOR) 10 MG tablet TAKE 1 TABLET EVERY DAY   No facility-administered encounter medications on file as of 04/26/2018.     Activities of Daily Living In your present state of health, do you have any difficulty performing the following activities: 04/26/2018 10/29/2017  Hearing? N N  Vision? N N  Difficulty concentrating or making decisions? N N  Walking or climbing stairs? N N  Dressing or bathing? N N  Doing errands, shopping? N N  Preparing Food and eating ? N -  Using the Toilet? N -  In the past six months, have you accidently leaked urine? N -  Do you have problems with loss of bowel control? N -  Managing your Medications? N -  Managing your Finances? N -  Housekeeping or managing your Housekeeping? N -  Some recent data might be hidden    Patient Care Team: [facility], Modena Nunnery, MD as PCP - General (Family Medicine)   Assessment:   This is a routine wellness examination for [place].    Exercise Activities and Dietary recommendations Current Exercise Habits: The  patient has a physically strenous job, but has no regular exercise apart from work., Exercise limited by: None identified  Goals    . Exercise 3x per week (30 min per time)       Fall Risk Fall Risk  04/26/2018 03/03/2018 10/11/2017 08/05/2017 06/15/2017  Falls in the past year? No Yes No No No  Number falls in past yr: - 1 - - -  Injury with Fall? - No - - -   Is the patient's home free of loose throw rugs in walkways, pet beds, electrical cords, etc?   yes      Grab bars in the bathroom? yes      Handrails on the stairs?   yes      Adequate lighting?   yes   Depression Screen PHQ 2/9 Scores 04/26/2018 03/03/2018 10/11/2017 08/05/2017  PHQ - 2 Score 0 0 0 0  PHQ- 9 Score - - 0 -     Office Visit from 04/26/2018 in [place]  AUDIT-C Score  1       Cognitive Function  Alert? Yes  Normal Appearance?Yes  Oriented to person? Yes  Place? Yes  Time? Yes  Recall of three objects? Yes  Can perform simple calculations? Yes  Displays appropriate judgment?Yes  Can read the correct time from a watch face?Yes           Immunization History  Administered Date(s) Administered  . Influenza, High Dose Seasonal PF 06/15/2017  . Influenza,inj,Quad PF,6+ Mos 06/19/2014, 07/28/2015, 06/18/2016  . Pneumococcal Conjugate-13 09/28/2013  . Pneumococcal Polysaccharide-23 07/28/2015  . Tdap 09/25/2006  . Zoster 05/09/2008    Qualifies for Shingles Vaccine?  Completed per immunization documentation in chart on 05/09/2008  Screening Tests Health Maintenance  Topic Date Due  . OPHTHALMOLOGY EXAM  06/19/2018 (Originally 12/17/2017)  . INFLUENZA VACCINE  06/20/2018 (Originally 04/20/2018)  . TETANUS/TDAP  04/27/2019 (Originally 09/25/2016)  . HEMOGLOBIN A1C  09/02/2018  . FOOT EXAM  03/07/2019  . COLONOSCOPY  08/29/2021  . Hepatitis C Screening  Completed  .  PNA vac Low Risk Adult  Completed   Cancer Screenings: Lung: Low Dose CT Chest recommended if Age 41-80 years, 30  pack-year currently smoking OR have quit w/in 15years. Patient does not qualify. 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 He recently had labs for chronic health conditions (June 2019), and has routine visit to continue management in Dec 2012. Recently saw cardiology, previously saw in the past twice a year, now to change to annual visits, has several eye specialists for cataracts, tumor and for his diabetes eye exam Going to eye appt later this month will have DM eye exam, will obtain records when completed.  Pt refuses referrals for nutrition for obese BMI, does not want any physical therapy for neuropathy, chronic arthritis, knee and back pain, but it was offered.  He states that he has advanced directives and they are scanned into [facility] electronic medical record.  I have personally reviewed and noted the following in the patient's chart:   . Medical and social history . Use of alcohol, tobacco or illicit drugs  . Current medications and supplements . Functional ability and status . Nutritional status . Physical activity . Advanced directives . List of other physicians . Hospitalizations, surgeries, and ER visits in previous 12 months . Vitals . Screenings to include cognitive, depression, and falls . Referrals and appointments  In addition, I have reviewed and discussed with patient certain preventive protocols, quality metrics, and best  practice recommendations. A written personalized care plan for preventive services as well as general preventive health recommendations were provided to patient.     Delsa Grana, PA-C  04/26/2018

## 2018-04-26 NOTE — Patient Instructions (Addendum)
Jimmy Bradshaw , Thank you for taking time to come for your Medicare Wellness Visit. I appreciate your ongoing commitment to your health goals. Please review the following plan we discussed and let me know if I can assist you in the future.   These are the goals we discussed: Goals    . Exercise 3x per week (30 min per time)       This is a list of the screening recommended for you and due dates:  Health Maintenance  Topic Date Due  . Tetanus Vaccine  09/25/2016  . Eye exam for diabetics  12/17/2017  . Flu Shot  04/20/2018  . Hemoglobin A1C  09/02/2018  . Complete foot exam   03/07/2019  . Colon Cancer Screening  08/29/2021  .  Hepatitis C: One time screening is recommended by Center for Disease Control  (CDC) for  adults born from [birth year] through 1964.   Completed  . Pneumonia vaccines  Completed   Please call us for an appointment with any cuts or injuries from dogs and we can update your tetanus  Return for flu shot  Health Maintenance, Male A healthy lifestyle and preventive care is important for your health and wellness. Ask your health care provider about what schedule of regular examinations is right for you. What should I know about weight and diet? Eat a Healthy Diet  Eat plenty of vegetables, fruits, whole grains, low-fat dairy products, and lean protein.  Do not eat a lot of foods high in solid fats, added sugars, or salt.  Maintain a Healthy Weight Regular exercise can help you achieve or maintain a healthy weight. You should:  Do at least 150 minutes of exercise each week. The exercise should increase your heart rate and make you sweat (moderate-intensity exercise).  Do strength-training exercises at least twice a week.  Watch Your Levels of Cholesterol and Blood Lipids  Have your blood tested for lipids and cholesterol every 5 years starting at 71 years of age. If you are at high risk for heart disease, you should start having your blood tested when you are 71 years  old. You may need to have your cholesterol levels checked more often if: ? Your lipid or cholesterol levels are high. ? You are older than 71 years of age. ? You are at high risk for heart disease.  What should I know about cancer screening? Many types of cancers can be detected early and may often be prevented. Lung Cancer  You should be screened every year for lung cancer if: ? You are a current smoker who has smoked for at least 30 years. ? You are a former smoker who has quit within the past 15 years.  Talk to your health care provider about your screening options, when you should start screening, and how often you should be screened.  Colorectal Cancer  Routine colorectal cancer screening usually begins at 71 years of age and should be repeated every 5-10 years until you are 71 years old. You may need to be screened more often if early forms of precancerous polyps or small growths are found. Your health care provider may recommend screening at an earlier age if you have risk factors for colon cancer.  Your health care provider may recommend using home test kits to check for hidden blood in the stool.  A small camera at the end of a tube can be used to examine your colon (sigmoidoscopy or colonoscopy). This checks for the earliest forms of  colorectal cancer.  Prostate and Testicular Cancer  Depending on your age and overall health, your health care provider may do certain tests to screen for prostate and testicular cancer.  Talk to your health care provider about any symptoms or concerns you have about testicular or prostate cancer.  Skin Cancer  Check your skin from head to toe regularly.  Tell your health care provider about any new moles or changes in moles, especially if: ? There is a change in a mole's size, shape, or color. ? You have a mole that is larger than a pencil eraser.  Always use sunscreen. Apply sunscreen liberally and repeat throughout the day.  Protect  yourself by wearing long sleeves, pants, a wide-brimmed hat, and sunglasses when outside.  What should I know about heart disease, diabetes, and high blood pressure?  If you are 70-48 years of age, have your blood pressure checked every 3-5 years. If you are 66 years of age or older, have your blood pressure checked every year. You should have your blood pressure measured twice-once when you are at a hospital or clinic, and once when you are not at a hospital or clinic. Record the average of the two measurements. To check your blood pressure when you are not at a hospital or clinic, you can use: ? An automated blood pressure machine at a pharmacy. ? A home blood pressure monitor.  Talk to your health care provider about your target blood pressure.  If you are between 82-15 years old, ask your health care provider if you should take aspirin to prevent heart disease.  Have regular diabetes screenings by checking your fasting blood sugar level. ? If you are at a normal weight and have a low risk for diabetes, have this test once every three years after the age of 7. ? If you are overweight and have a high risk for diabetes, consider being tested at a younger age or more often.  A one-time screening for abdominal aortic aneurysm (AAA) by ultrasound is recommended for men aged 1-75 years who are current or former smokers. What should I know about preventing infection? Hepatitis B If you have a higher risk for hepatitis B, you should be screened for this virus. Talk with your health care provider to find out if you are at risk for hepatitis B infection. Hepatitis C Blood testing is recommended for:  Everyone born from [birth year] through 1964.  Anyone with known risk factors for hepatitis C.  Sexually Transmitted Diseases (STDs)  You should be screened each year for STDs including gonorrhea and chlamydia if: ? You are sexually active and are younger than 71 years of age. ? You are older than 71  years of age and your health care provider tells you that you are at risk for this type of infection. ? Your sexual activity has changed since you were last screened and you are at an increased risk for chlamydia or gonorrhea. Ask your health care provider if you are at risk.  Talk with your health care provider about whether you are at high risk of being infected with HIV. Your health care provider may recommend a prescription medicine to help prevent HIV infection.  What else can I do?  Schedule regular health, dental, and eye exams.  Stay current with your vaccines (immunizations).  Do not use any tobacco products, such as cigarettes, chewing tobacco, and e-cigarettes. If you need help quitting, ask your health care provider.  Limit alcohol intake to no  more than 2 drinks per day. One drink equals 12 ounces of beer, 5 ounces of wine, or 1 ounces of hard liquor.  Do not use street drugs.  Do not share needles.  Ask your health care provider for help if you need support or information about quitting drugs.  Tell your health care provider if you often feel depressed.  Tell your health care provider if you have ever been abused or do not feel safe at home. This information is not intended to replace advice given to you by your health care provider. Make sure you discuss any questions you have with your health care provider. Document Released: 03/04/2008 Document Revised: 05/05/2016 Document Reviewed: 06/10/2015 Elsevier Interactive Patient Education  [employer].

## 2018-05-03 DIAGNOSIS — R351 Nocturia: Secondary | ICD-10-CM | POA: Diagnosis not present

## 2018-05-03 DIAGNOSIS — N400 Enlarged prostate without lower urinary tract symptoms: Secondary | ICD-10-CM | POA: Diagnosis not present

## 2018-05-03 DIAGNOSIS — N401 Enlarged prostate with lower urinary tract symptoms: Secondary | ICD-10-CM | POA: Diagnosis not present

## 2018-05-04 ENCOUNTER — Encounter: Payer: Self-pay | Admitting: *Deleted

## 2018-05-12 DIAGNOSIS — R51 Headache: Secondary | ICD-10-CM | POA: Diagnosis not present

## 2018-06-09 ENCOUNTER — Other Ambulatory Visit: Payer: Self-pay | Admitting: Family Medicine

## 2018-06-13 ENCOUNTER — Telehealth: Payer: Self-pay | Admitting: Family Medicine

## 2018-06-13 MED ORDER — LISINOPRIL 2.5 MG PO TABS: 2.5000 mg | ORAL_TABLET | Freq: Every day | ORAL | 1 refills | Status: DC

## 2018-06-13 NOTE — Telephone Encounter (Addendum)
Patient is calling to say he needs a refill of his med for gout, dosent know the name of it, would like for you to call him regarding this. Would need to go to [facility]   Also needs refill on his lisinopril refilled to go to [place]  [phone]

## 2018-06-13 NOTE — Telephone Encounter (Signed)
Prescription sent to pharmacy for Lisinopril.   Patient has not had prescription for gout since 06/2017. Advised OV required.   Appointment scheduled.

## 2018-06-14 ENCOUNTER — Encounter: Payer: Self-pay | Admitting: Physician Assistant

## 2018-06-14 ENCOUNTER — Ambulatory Visit (INDEPENDENT_AMBULATORY_CARE_PROVIDER_SITE_OTHER): Payer: Medicare HMO | Admitting: Physician Assistant

## 2018-06-14 VITALS — BP 132/88 | HR 61 | Temp 98.1°F | Resp 18 | Ht 71.0 in | Wt 225.0 lb

## 2018-06-14 DIAGNOSIS — M25472 Effusion, left ankle: Secondary | ICD-10-CM | POA: Diagnosis not present

## 2018-06-14 DIAGNOSIS — M1000 Idiopathic gout, unspecified site: Secondary | ICD-10-CM | POA: Diagnosis not present

## 2018-06-14 DIAGNOSIS — M25572 Pain in left ankle and joints of left foot: Secondary | ICD-10-CM | POA: Diagnosis not present

## 2018-06-14 MED ORDER — PREDNISONE 20 MG PO TABS: ORAL_TABLET | ORAL | 0 refills | Status: DC

## 2018-06-14 NOTE — Progress Notes (Signed)
Patient ID: Jimmy Bradshaw MRN: [id number], DOB: 20-Dec-1945, 72 y.o. Date of Encounter: 06/14/2018, 11:33 AM    Chief Complaint:  Chief Complaint  Patient presents with  . left ankle pain     HPI: 72 y.o. year old male presents with above.  Prior to visit with him, I reviewed chart.  I reviewed that he had visit with Dr. Buelah Manis 03/03/2018 to follow-up his diabetes hypertension hyperlipidemia.  Reviewed labs that were drawn that day.  Today during his visit I asked if he wanted me to follow-up those issues as well.  He states that he is fine to wait until Dr. Buelah Manis returns from her maternity leave to follow-up those issues.  States that everything has been stable regarding them.  Also reviewed that his labs were good on 03/03/2018 A1c was 6.1 and other labs were good as well.  When I was reviewing his chart I also reviewed that he had visit with Dr. Buelah Manis from 06/15/2017 regarding left foot pain.  That note mentioned that he had taken some Indocin which was an old prescription but then he had been instructed not to take that anymore because it interacted with his Eliquis.  Also reviewed that lab 06/15/2017 uric acid was 8.2.  Dr. Dorian Heckle note at that time said to start allopurinol 100 mg daily.  However, when I reviewed med list today I do not see allopurinol.   TODAY: Today he reports that years ago he had a gout flare and that years ago he was on allopurinol. However he mentions that he felt like the allopurinol was not working because still had some gout.   At another time in the conversation he also states that he "was trying to not take so many medicines" and that is why he is not on the allopurinol.  Currently he is reporting some pain and swelling at left lateral malleolus region of his left foot.  It is 11:30 AM now.  He says that earlier this morning the foot was not as painful and that even in just these few hours it is getting more painful.  Reports that he has had no trauma  or injury to the foot.  Has done no unusual activity and has not twisted his ankle to cause a sprain.  He has no other specific concerns to address.     Home Meds:   Outpatient Medications Prior to Visit  Medication Sig Dispense Refill  . apixaban (ELIQUIS) 5 MG TABS tablet Take 5 mg by mouth 2 (two) times daily.    . flecainide (TAMBOCOR) 50 MG tablet Take 1 tablet (50 mg total) by mouth 2 (two) times daily. 180 tablet 3  . gabapentin (NEURONTIN) 100 MG capsule Take 2-3 capsules (200-300 mg total) by mouth at bedtime as needed. TAKE 1 TO 3 CAPSULES AT BEDTIME FOR NERVE PAIN 90 capsule 3  . lisinopril (PRINIVIL,ZESTRIL) 2.5 MG tablet Take 1 tablet (2.5 mg total) by mouth daily. 90 tablet 1  . metFORMIN (GLUCOPHAGE) 500 MG tablet Take 500 mg by mouth daily.    . metoprolol succinate (TOPROL-XL) 25 MG 24 hr tablet TAKE 1 TABLET (25 MG TOTAL) BY MOUTH DAILY. 90 tablet 3  . rosuvastatin (CRESTOR) 10 MG tablet TAKE 1 TABLET EVERY DAY 90 tablet 3   No facility-administered medications prior to visit.     Allergies:  Allergies  Allergen Reactions  . Pravastatin Other (See Comments)    Headache and legs hurt  . Penicillins Rash  Had penicillin shot w/o reaction 2018      Review of Systems: See HPI for pertinent ROS. All other ROS negative.    Physical Exam: Blood pressure 132/88, pulse 61, temperature 98.1 F (36.7 C), temperature source Oral, resp. rate 18, height 5\' 11"  (1.803 m), weight 102.1 kg, SpO2 97 %., Body mass index is 31.38 kg/m. General: WNWD WM.  Appears in no acute distress. Neck: Supple. No thyromegaly. No lymphadenopathy. Lungs: Clear bilaterally to auscultation without wheezes, rales, or rhonchi. Breathing is unlabored. Heart: Regular rhythm. No murmurs, rubs, or gallops. Msk:  Strength and tone normal for age. Extremities/Skin: Left Foot: Lateral Malleolus region has mild swelling, mild warmth. No erythema.  Neuro: Alert and oriented X 3. Moves all extremities  spontaneously. Gait is normal. CNII-XII grossly in tact. Psych:  Responds to questions appropriately with a normal affect.     ASSESSMENT AND PLAN:  72 y.o. year old male with   1. Pain and swelling of left ankle He is on Eliquis so avoid NSAIDs including Colcrys. Will treat with prednisone taper. Check a uric acid level to document current uric acid level.  Suspect that it will be high given that it was elevated in the past.  Once I get this lab result -- and after this flare is resolved---then will add allopurinol and low dose prednisone (to avoid flare with starting allopurinol) Discussed all of this with him today and explained mechanism of medications and use of meds. - Uric acid - predniSONE (DELTASONE) 20 MG tablet; Take 3 daily for 2 days, then 2 daily for 2 days, then 1 daily for 2 days.  Dispense: 12 tablet; Refill: 0  2. Idiopathic gout, unspecified chronicity, unspecified site - Uric acid - predniSONE (DELTASONE) 20 MG tablet; Take 3 daily for 2 days, then 2 daily for 2 days, then 1 daily for 2 days.  Dispense: 12 tablet; Refill: 0   Signed, [address] [place], [place], [facility] 06/14/2018 11:33 AM

## 2018-06-15 ENCOUNTER — Encounter: Payer: Self-pay | Admitting: Family Medicine

## 2018-06-15 LAB — URIC ACID: Uric Acid, Serum: 7.8 mg/dL (ref 4.0–8.0)

## 2018-06-16 ENCOUNTER — Other Ambulatory Visit: Payer: Self-pay

## 2018-06-16 MED ORDER — ALLOPURINOL 100 MG PO TABS: 100.0000 mg | ORAL_TABLET | Freq: Every day | ORAL | 11 refills | Status: DC

## 2018-06-16 MED ORDER — PREDNISONE 20 MG PO TABS: 20.0000 mg | ORAL_TABLET | Freq: Every day | ORAL | 0 refills | Status: DC

## 2018-06-20 ENCOUNTER — Telehealth: Payer: Self-pay

## 2018-06-20 NOTE — Telephone Encounter (Signed)
Patient called and left a message that his prednisone 20 mg had not been sent in to the pharmacy. I called pharmacy and confirmed that the  rx had been sent as well as picked up .

## 2018-06-27 DIAGNOSIS — D170 Benign lipomatous neoplasm of skin and subcutaneous tissue of head, face and neck: Secondary | ICD-10-CM | POA: Diagnosis not present

## 2018-06-27 DIAGNOSIS — D225 Melanocytic nevi of trunk: Secondary | ICD-10-CM | POA: Diagnosis not present

## 2018-06-27 DIAGNOSIS — L723 Sebaceous cyst: Secondary | ICD-10-CM | POA: Diagnosis not present

## 2018-06-27 DIAGNOSIS — L821 Other seborrheic keratosis: Secondary | ICD-10-CM | POA: Diagnosis not present

## 2018-06-27 DIAGNOSIS — L918 Other hypertrophic disorders of the skin: Secondary | ICD-10-CM | POA: Diagnosis not present

## 2018-06-27 DIAGNOSIS — D692 Other nonthrombocytopenic purpura: Secondary | ICD-10-CM | POA: Diagnosis not present

## 2018-06-27 DIAGNOSIS — D1801 Hemangioma of skin and subcutaneous tissue: Secondary | ICD-10-CM | POA: Diagnosis not present

## 2018-07-06 ENCOUNTER — Other Ambulatory Visit: Payer: Self-pay | Admitting: Family Medicine

## 2018-07-06 ENCOUNTER — Ambulatory Visit (INDEPENDENT_AMBULATORY_CARE_PROVIDER_SITE_OTHER): Payer: Medicare HMO

## 2018-07-06 DIAGNOSIS — Z23 Encounter for immunization: Secondary | ICD-10-CM | POA: Diagnosis not present

## 2018-07-06 NOTE — Progress Notes (Signed)
Patient was in office for high dose flu vaccine.Patient received vaccine in his left deltoid.Patient tolerated well  

## 2018-07-14 ENCOUNTER — Other Ambulatory Visit: Payer: Self-pay

## 2018-07-14 MED ORDER — ALLOPURINOL 100 MG PO TABS: 100.0000 mg | ORAL_TABLET | Freq: Every day | ORAL | 3 refills | Status: DC

## 2018-07-18 ENCOUNTER — Ambulatory Visit: Payer: Medicare HMO | Admitting: Family Medicine

## 2018-07-19 ENCOUNTER — Encounter: Payer: Self-pay | Admitting: Family Medicine

## 2018-07-20 ENCOUNTER — Ambulatory Visit (INDEPENDENT_AMBULATORY_CARE_PROVIDER_SITE_OTHER): Payer: Medicare HMO | Admitting: Family Medicine

## 2018-07-20 ENCOUNTER — Other Ambulatory Visit: Payer: Self-pay

## 2018-07-20 ENCOUNTER — Encounter: Payer: Self-pay | Admitting: Family Medicine

## 2018-07-20 VITALS — BP 138/72 | HR 61 | Wt 220.0 lb

## 2018-07-20 DIAGNOSIS — M25512 Pain in left shoulder: Secondary | ICD-10-CM

## 2018-07-20 DIAGNOSIS — G8929 Other chronic pain: Secondary | ICD-10-CM | POA: Diagnosis not present

## 2018-07-20 NOTE — Progress Notes (Signed)
Subjective:    Patient ID: Jimmy Bradshaw, male    DOB: [birth year], 72 y.o.   MRN: [id number]  HPI  07/29/16 Patient has a history of an injury to his left shoulder more than 10 years ago when he slipped and fell. Since that time, he has had intermittent pain in his left shoulder. Recently he developed constant pain with abduction greater than 80. He has severe pain with empty can testing and with Hawkins testing. There've been no new falls or injuries. He has pain and aching throbbing pain in his shoulder when he tries to sleep. He has difficult time lifting his arm above his head and has a difficult time coming his hair or putting on shirts. He has obvious symptoms of impingement syndrome along with tendinitis in his rotator cuff. 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  At that time, my plan was:  I suspect that the patient has a partial tear in his suprasternal not as with underlying impingement syndrome and bursitis in his left shoulder. We discussed this at length. Prior to performing MRI, the patient would like to try cortisone shot. Using sterile technique, I injected the left shoulder with 2 mL of lidocaine, 2 mL of Marcaine, and 2 mL of 40 mg per mL Kenalog. The patient tolerated the procedure well without complication  8/32/54 Is here today for follow-up. He states the shot lasted a good 8 weeks but has slowly started to wear off and is now causing him significant pain again. He is requesting a repeat cortisone injection.  At that time, my plan was: We discussed the patient's options. I recommended an MRI to rule out a tear that could be correctable surgically. He would like to repeat cortisone injection at least one more time before proceeding with an MRI. If the pain returns, he agrees to the MRI at that point. 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 I'm not sure that he will be a candidate for surgical correction given the length of time since injury but I feel an MRI is warranted  prior to committing the patient to cortisone injections every 3 months. He would like to try one additional time. Using sterile technique, I injected the left shoulder with 2 mL of lidocaine, 2 mL of Marcaine, and 2 mL of 40 mg per mL Kenalog. He tolerated the procedure without complication  9/82/64 Patient is here today requesting a repeat cortisone injection. It has been 3 months since his last. Patient states that the shots helped him immensely. He is able to sleep better without pain. However after 3 months the pain becomes moderate to severe and is unable to lift his arm greater than 90. Last time we discussed an MRI of the shoulder. He is adamant that he would not want surgery at this point. We have not performed a basic x-ray and I suggested that we at least do that to rule out underlying skeletal abnormalities that may account for his problem. 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 He would be willing to proceed with an x-ray of the shoulder. However he would like to perform a cortisone injection today. I explained to the patient that repeat cortisone increases his risk of AVN of the shoulder joint. He is willing to proceed with the injection.  At that time, my plan was: I would like the patient to get a basic  x-ray of his left shoulder. Meanwhile I injected the left shoulder with 2 mL of lidocaine, 2 mL of Marcaine, and 2 mL of 40 mg per mL Kenalog. Patient tolerated procedure well without complication.  Also recommended physical therapy exercises to be performed with the Theraband that the patient could do at home including internal and external rotation of the shoulder and abduction of the shoulder  03/08/17 Patient returns today requesting a repeat injection in his shoulder. It is only been 2 months. The pain returned after only one month. As long as his arm is in a completely  dependent position, he is in no pain. As soon as he tries to lift his left shoulder a raise his arm over his head, the pain intensifies.  At that time, my plan was: I will not charge the patient for an office visit.  I am unable to perform an injection because it has not been 3 months. Furthermore, I'll proceed with an MRI of the shoulder. I do not feel comfortable continuing to repeatedly inject the shoulder without determining the exact etiology of the problem. Perhaps the patient will be a candidate for surgical correction.  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 There is a tear of the anterior and far lateral supraspinatus measuring 0.6 cm from front to back. The tear has both full-thickness and undersurface component. There is no tendon retraction. The rotator cuff is otherwise intact  I recommended ortho consult.    He will not be able to see them until August 8. Furthermore he is deferring surgery until the fall. Therefore he is hopeful that we will be able to perform a cortisone injection today to help with his pain.  08/05/17 He has seen orthopedic surgery.  They have recommended physical therapy prior to any surgery.  Orthopedics also told the patient that he can receive a cortisone injection without causing further harm if necessary to control pain.  Patient is seen benefit from physical therapy however he continues to have severe pain in the shoulder particularly at night when he is trying to sleep and he would like a repeat cortisone injection today rather than having to pay a higher co-pay and see the orthopedist for the injection.  At that time, my plan was: Using sterile technique, I injected the left subacromial space with a mixture of 2 mL of lidocaine, 2 mL of Marcaine, and 2 mL of 40 mg per mL Kenalog. The patient tolerated the procedure well without  complication.  Patient will continue physical therapy and follow-up as planned with orthopedics.  11/29/17 Patient presents today requesting a repeat injection in his left shoulder.  He reports pain with abduction.  He reports pain with internal and external rotation.  Orthopedics is determined that the patient is stable to receive cortisone injections up to every 3 months as needed for pain in his shoulder until he cannot tolerate the pain any further and then he would be a candidate for shoulder surgery.  Therefore he is requesting a repeat cortisone injection.  At that time, my plan was: Using sterile technique, I injected the left subacromial space with a mixture of 2 mL of lidocaine, 2 mL of Marcaine, and 2 mL of 40 mg per mL Kenalog. The patient tolerated the procedure well without complication.    02/23/18 Patient is here today requesting a repeat injection of cortisone in his  left shoulder.  He continues to have pain with abduction greater than 90 to 100 degrees as well as internal and external rotation.  He continues to decline any surgical intervention.  Physical therapy provided minimal relief.  However he see substantial benefit from cortisone injections.  He usually gets 3 months of pain-free range of motion after a cortisone injection into the subacromial space.  He is willing to accept the risk and asked me to repeat the injection today.  07/20/18 Patient is again requesting a cortisone injection in his left shoulder in the subacromial space.  He states that he is done well over the last 4 months however the pain has returned.  He has pain with abduction greater than 90 degrees.  He also continues to have pain with internal and external rotation.  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  However, there is no evidence of a fracture or dislocation today on exam.  Patient is able to abduct his shoulder to approximately  100 degrees.  He has full internal and external rotation although with pain.  There is crepitus with range of motion. Past Medical History:  Diagnosis Date  . Anxiety   . Arthritis    HANDS,  SHOULDERS  . Bladder neck obstruction   . BPH (benign prostatic hypertrophy)   . Dehydration 10/29/2017  . Frequency of urination   . H/O: gout   . History of colon polyps   . Hyperlipidemia   . Hypokalemia 10/28/2017  . Nocturia   . Type 2 diabetes mellitus ([place])   . Urgency of urination   . Wears hearing aid    BILATERAL   Past Surgical History:  Procedure Laterality Date  . CARDIOVERSION N/A 10/26/2016   Procedure: CARDIOVERSION;  Surgeon: Larey Dresser, MD;  Location: [facility];  Service: Cardiovascular;  Laterality: N/A;  . COLONOSCOPY  08-30-2011  . I&D KNEE WITH POLY EXCHANGE Left 10/08/2014   Procedure: IRRIGATION AND DEBRIDEMENT LEFT UNI KNEE ARTHOPLASTY  WITH POLY EXCHANGE;  Surgeon: Mauri Pole, MD;  Location: [facility];  Service: Orthopedics;  Laterality: Left;  . KNEE ARTHROSCOPY Left 11/ 2013  . PARTIAL KNEE ARTHROPLASTY Left 02/05/2013   Procedure: LEFT KNEE MEDIAL UNICOMPARTMENTAL KNEE;  Surgeon: Mauri Pole, MD;  Location: [facility];  Service: Orthopedics;  Laterality: Left;  . TRANSURETHRAL RESECTION OF PROSTATE N/A 09/30/2014   Procedure: TRANSURETHRAL RESECTION OF THE PROSTATE WITH GYRUS INSTRUMENTS;  Surgeon: Bernestine Amass, MD;  Location: [facility];  Service: Urology;  Laterality: N/A;   Current Outpatient Medications on File Prior to Visit  Medication Sig Dispense Refill  . allopurinol (ZYLOPRIM) 100 MG tablet Take 1 tablet (100 mg total) by mouth daily. 90 tablet 3  . apixaban (ELIQUIS) 5 MG TABS tablet Take 5 mg by mouth 2 (two) times daily.    . flecainide (TAMBOCOR) 50 MG tablet Take 1 tablet (50 mg total) by mouth 2 (two) times daily. 180 tablet 3  . gabapentin (NEURONTIN) 100 MG capsule TAKE 1 TO 3 CAPSULES AT BEDTIME FOR NERVE PAIN AS NEEDED 90  capsule 3  . lisinopril (PRINIVIL,ZESTRIL) 2.5 MG tablet Take 1 tablet (2.5 mg total) by mouth daily. 90 tablet 1  . metFORMIN (GLUCOPHAGE) 500 MG tablet Take 500 mg by mouth daily.    . metoprolol succinate (TOPROL-XL) 25 MG 24 hr tablet TAKE 1 TABLET (25 MG TOTAL) BY MOUTH DAILY. [place]  tablet 3  . predniSONE (DELTASONE) 20 MG tablet Take 3 daily for 2 days, then 2 daily for 2 days, then 1 daily for 2 days. 12 tablet 0  . predniSONE (DELTASONE) 20 MG tablet Take 1 tablet (20 mg total) by mouth daily with breakfast. 7 tablet 0  . rosuvastatin (CRESTOR) 10 MG tablet TAKE 1 TABLET EVERY DAY 90 tablet 3   No current facility-administered medications on file prior to visit.    Allergies  Allergen Reactions  . Pravastatin Other (See Comments)    Headache and legs hurt  . Penicillins Rash    Had penicillin shot w/o reaction 2018   Social History   Socioeconomic History  . Marital status: Married    Spouse name: Not on file  . Number of children: Not on file  . Years of education: Not on file  . Highest education level: Not on file  Occupational History  . Not on file  Social Needs  . Financial resource strain: Not on file  . Food insecurity:    Worry: Not on file    Inability: Not on file  . Transportation needs:    Medical: Not on file    Non-medical: Not on file  Tobacco Use  . Smoking status: Former Smoker    Years: 4.00    Types: Cigarettes    Last attempt to quit: 12/08/1998    Years since quitting: 19.6  . Smokeless tobacco: Never Used  Substance and Sexual Activity  . Alcohol use: Yes    Alcohol/week: 1.0 standard drinks    Types: 1 Glasses of wine per week    Comment: RARE - a sip, occassional  . Drug use: No  . Sexual activity: Not Currently  Lifestyle  . Physical activity:    Days per week: 0 days    Minutes per session: 0 min  . Stress: Not on file  Relationships  . Social connections:    Talks on phone: Not on file    Gets together: Not on file    Attends  religious service: Not on file    Active member of club or organization: Not on file    Attends meetings of clubs or organizations: Not on file    Relationship status: Not on file  . Intimate partner violence:    Fear of current or ex partner: Not on file    Emotionally abused: Not on file    Physically abused: Not on file    Forced sexual activity: Not on file  Other Topics Concern  . Not on file  Social History Narrative  . Not on file      Review of Systems  All other systems reviewed and are negative.      Objective:   Physical Exam  Constitutional: He appears well-developed and well-nourished.  Cardiovascular: Normal rate, regular rhythm and normal heart sounds.  Pulmonary/Chest: Effort normal and breath sounds normal. No respiratory distress. He has no wheezes. He has no rales.  Musculoskeletal:       Left shoulder: He exhibits decreased range of motion, tenderness, pain and decreased strength.  Vitals reviewed.          Assessment & Plan:  Chronic left shoulder pain   Using sterile technique, I injected the patient's left shoulder with a mixture of 2 cc lidocaine, 2 cc Marcaine, and 2 cc of 40 mg/mL Kenalog.  The patient tolerated the procedure well without complication.  I again encouraged the patient to follow-up with orthopedic  Psychologist, sport and exercise.  I explained that I feel were only kicking the can down the road and that ultimately he will require surgery to repair the damage in his left shoulder.  Patient declines this option at the present time and elects to continue the current treatment regimen as he wants to avoid surgery given his age.

## 2018-07-25 DIAGNOSIS — D487 Neoplasm of uncertain behavior of other specified sites: Secondary | ICD-10-CM | POA: Diagnosis not present

## 2018-07-25 DIAGNOSIS — H5319 Other subjective visual disturbances: Secondary | ICD-10-CM | POA: Diagnosis not present

## 2018-08-11 ENCOUNTER — Encounter: Payer: Self-pay | Admitting: Family Medicine

## 2018-08-11 ENCOUNTER — Other Ambulatory Visit: Payer: Self-pay

## 2018-08-11 ENCOUNTER — Ambulatory Visit (INDEPENDENT_AMBULATORY_CARE_PROVIDER_SITE_OTHER): Payer: Medicare HMO | Admitting: Family Medicine

## 2018-08-11 VITALS — BP 134/74 | HR 82 | Temp 98.1°F | Resp 14 | Ht 71.0 in | Wt 218.0 lb

## 2018-08-11 DIAGNOSIS — E1140 Type 2 diabetes mellitus with diabetic neuropathy, unspecified: Secondary | ICD-10-CM

## 2018-08-11 DIAGNOSIS — I10 Essential (primary) hypertension: Secondary | ICD-10-CM | POA: Diagnosis not present

## 2018-08-11 DIAGNOSIS — E1149 Type 2 diabetes mellitus with other diabetic neurological complication: Secondary | ICD-10-CM | POA: Diagnosis not present

## 2018-08-11 DIAGNOSIS — E7800 Pure hypercholesterolemia, unspecified: Secondary | ICD-10-CM | POA: Diagnosis not present

## 2018-08-11 DIAGNOSIS — I480 Paroxysmal atrial fibrillation: Secondary | ICD-10-CM | POA: Diagnosis not present

## 2018-08-11 MED ORDER — TETANUS-DIPHTH-ACELL PERTUSSIS 5-2.5-18.5 LF-MCG/0.5 IM SUSP: 0.5000 mL | Freq: Once | INTRAMUSCULAR | 0 refills | Status: AC

## 2018-08-11 NOTE — Patient Instructions (Addendum)
F/U for Physical

## 2018-08-11 NOTE — Progress Notes (Signed)
   Subjective:    Patient ID: Jimmy Bradshaw, male    DOB: 1946-03-03, 72 y.o.   MRN: [id number]  Patient presents for Follow-up (DM)   Pt here to f/u chronic medical problems.    DM-  Last A1C 6.1%      CBG - not elevated , bring his meter with him today he is taking his medication as prescribed metformin daily Specific concerns today he is doing quite well.  Medications reviewed.   He is also on Crestor for hyperlipidemia lisinopril for renal protection.  Asked about his flecainide which he has paroxysmal atrial fibrillation.  Want to know if he come off of this medication and recommend that he follow-up with his cardiologist regarding this.   Review Of Systems:  GEN- denies fatigue, fever, weight loss,weakness, recent illness HEENT- denies eye drainage, change in vision, nasal discharge, CVS- denies chest pain, palpitations RESP- denies SOB, cough, wheeze ABD- denies N/V, change in stools, abd pain GU- denies dysuria, hematuria, dribbling, incontinence MSK- denies joint pain, muscle aches, injury Neuro- denies headache, dizziness, syncope, seizure activity       Objective:    BP 134/74   Pulse 82   Temp 98.1 F (36.7 C) (Oral)   Resp 14   Ht 5\' 11"  (1.803 m)   Wt 218 lb (98.9 kg)   SpO2 98%   BMI 30.40 kg/m  GEN- NAD, alert and oriented x3 HEENT- PERRL, EOMI, non injected sclera, pink conjunctiva, MMM, oropharynx clear Neck- Supple, no thyromegaly CVS- RRR, no murmur RESP-CTAB ABD-NABS,soft,NT,ND EXT- No edema Pulses- Radial, DP- 2+        Assessment & Plan:      Problem List Items Addressed This Visit      Unprioritized   Atrial fibrillation (HCC)    Currently in sinus rhythm, will discuss with his cardiologist flecanide, blood pressure controlled      Diabetic neuropathy (HCC)   HTN (hypertension)    controlled      Hyperlipidemia    Lipids at goal , no change to crestor      Type 2 diabetes mellitus with diabetic neuropathy, unspecified  ([place]) - Primary    Has been well controlled, no hypoglycemia Recheck A1C No change to metformin      Relevant Orders   Basic metabolic panel (Completed)   CBC with Differential/Platelet (Completed)   Hemoglobin A1c (Completed)   Microalbumin / creatinine urine ratio (Completed)      Note: This dictation was prepared with Dragon dictation along with smaller phrase technology. Any transcriptional errors that result from this process are unintentional.

## 2018-08-12 LAB — BASIC METABOLIC PANEL
BUN / CREAT RATIO: 23 (calc) — AB (ref 6–22)
BUN: 27 mg/dL — AB (ref 7–25)
CO2: 26 mmol/L (ref 20–32)
Calcium: 9.6 mg/dL (ref 8.6–10.3)
Chloride: 104 mmol/L (ref 98–110)
Creat: 1.18 mg/dL (ref 0.70–1.18)
GLUCOSE: 88 mg/dL (ref 65–99)
Potassium: 4.6 mmol/L (ref 3.5–5.3)
SODIUM: 139 mmol/L (ref 135–146)

## 2018-08-12 LAB — CBC WITH DIFFERENTIAL/PLATELET
BASOS ABS: 36 {cells}/uL (ref 0–200)
Basophils Relative: 0.4 %
EOS ABS: 100 {cells}/uL (ref 15–500)
Eosinophils Relative: 1.1 %
HEMATOCRIT: 45.7 % (ref 38.5–50.0)
HEMOGLOBIN: 15.3 g/dL (ref 13.2–17.1)
LYMPHS ABS: 1820 {cells}/uL (ref 850–3900)
MCH: 30.3 pg (ref 27.0–33.0)
MCHC: 33.5 g/dL (ref 32.0–36.0)
MCV: 90.5 fL (ref 80.0–100.0)
MPV: 11.6 fL (ref 7.5–12.5)
Monocytes Relative: 8.9 %
NEUTROS ABS: 6334 {cells}/uL (ref 1500–7800)
Neutrophils Relative %: 69.6 %
Platelets: 214 10*3/uL (ref 140–400)
RBC: 5.05 10*6/uL (ref 4.20–5.80)
RDW: 12.9 % (ref 11.0–15.0)
Total Lymphocyte: 20 %
WBC: 9.1 10*3/uL (ref 3.8–10.8)
WBCMIX: 810 {cells}/uL (ref 200–950)

## 2018-08-12 LAB — MICROALBUMIN / CREATININE URINE RATIO
Creatinine, Urine: 117 mg/dL (ref 20–320)
MICROALB/CREAT RATIO: 10 ug/mg{creat} (ref <30)
Microalb, Ur: 1.2 mg/dL

## 2018-08-12 LAB — HEMOGLOBIN A1C
EAG (MMOL/L): 7.1 (calc)
HEMOGLOBIN A1C: 6.1 %{Hb} — AB (ref <5.7)
MEAN PLASMA GLUCOSE: 128 (calc)

## 2018-08-13 ENCOUNTER — Encounter: Payer: Self-pay | Admitting: Family Medicine

## 2018-08-13 NOTE — Assessment & Plan Note (Signed)
controlled 

## 2018-08-13 NOTE — Assessment & Plan Note (Addendum)
Has been well controlled, no hypoglycemia Recheck A1C No change to metformin Goal < 7% if no adverse reactions

## 2018-08-13 NOTE — Assessment & Plan Note (Signed)
Currently in sinus rhythm, will discuss with his cardiologist flecanide, blood pressure controlled

## 2018-08-13 NOTE — Assessment & Plan Note (Signed)
Lipids at goal , no change to crestor

## 2018-08-14 ENCOUNTER — Encounter: Payer: Self-pay | Admitting: *Deleted

## 2018-08-14 DIAGNOSIS — H35371 Puckering of macula, right eye: Secondary | ICD-10-CM | POA: Diagnosis not present

## 2018-08-14 DIAGNOSIS — H01024 Squamous blepharitis left upper eyelid: Secondary | ICD-10-CM | POA: Diagnosis not present

## 2018-08-14 DIAGNOSIS — H01025 Squamous blepharitis left lower eyelid: Secondary | ICD-10-CM | POA: Diagnosis not present

## 2018-08-14 DIAGNOSIS — H01021 Squamous blepharitis right upper eyelid: Secondary | ICD-10-CM | POA: Diagnosis not present

## 2018-08-14 DIAGNOSIS — H01022 Squamous blepharitis right lower eyelid: Secondary | ICD-10-CM | POA: Diagnosis not present

## 2018-08-14 DIAGNOSIS — H2513 Age-related nuclear cataract, bilateral: Secondary | ICD-10-CM | POA: Diagnosis not present

## 2018-08-14 DIAGNOSIS — H11132 Conjunctival pigmentations, left eye: Secondary | ICD-10-CM | POA: Diagnosis not present

## 2018-08-23 DIAGNOSIS — H43822 Vitreomacular adhesion, left eye: Secondary | ICD-10-CM | POA: Diagnosis not present

## 2018-08-23 DIAGNOSIS — H35373 Puckering of macula, bilateral: Secondary | ICD-10-CM | POA: Diagnosis not present

## 2018-08-23 DIAGNOSIS — H2512 Age-related nuclear cataract, left eye: Secondary | ICD-10-CM | POA: Diagnosis not present

## 2018-09-26 ENCOUNTER — Other Ambulatory Visit: Payer: Self-pay | Admitting: Cardiovascular Disease

## 2018-09-26 NOTE — Telephone Encounter (Signed)
Pt last saw Dr Acie Fredrickson 03/20/18, last labs 08/11/18 Creat 1.18, age 73, weight 98.9kg, based on specified criteria pt is on appropriate dosage of Eliquis 5mg  BID.  Will refill rx.

## 2018-10-17 DIAGNOSIS — H0102A Squamous blepharitis right eye, upper and lower eyelids: Secondary | ICD-10-CM | POA: Diagnosis not present

## 2018-10-17 DIAGNOSIS — H11132 Conjunctival pigmentations, left eye: Secondary | ICD-10-CM | POA: Diagnosis not present

## 2018-10-17 DIAGNOSIS — H2513 Age-related nuclear cataract, bilateral: Secondary | ICD-10-CM | POA: Diagnosis not present

## 2018-10-17 DIAGNOSIS — H35371 Puckering of macula, right eye: Secondary | ICD-10-CM | POA: Diagnosis not present

## 2018-10-17 DIAGNOSIS — H0102B Squamous blepharitis left eye, upper and lower eyelids: Secondary | ICD-10-CM | POA: Diagnosis not present

## 2018-10-17 DIAGNOSIS — E119 Type 2 diabetes mellitus without complications: Secondary | ICD-10-CM | POA: Diagnosis not present

## 2018-10-17 LAB — HM DIABETES EYE EXAM

## 2018-10-18 ENCOUNTER — Encounter: Payer: Self-pay | Admitting: *Deleted

## 2018-11-07 ENCOUNTER — Other Ambulatory Visit: Payer: Self-pay

## 2018-11-07 ENCOUNTER — Encounter: Payer: Self-pay | Admitting: Family Medicine

## 2018-11-07 ENCOUNTER — Ambulatory Visit (INDEPENDENT_AMBULATORY_CARE_PROVIDER_SITE_OTHER): Payer: Medicare HMO | Admitting: Family Medicine

## 2018-11-07 VITALS — BP 122/68 | HR 82 | Temp 98.3°F | Resp 16 | Ht 71.0 in | Wt 228.0 lb

## 2018-11-07 DIAGNOSIS — R609 Edema, unspecified: Secondary | ICD-10-CM | POA: Diagnosis not present

## 2018-11-07 DIAGNOSIS — I480 Paroxysmal atrial fibrillation: Secondary | ICD-10-CM

## 2018-11-07 DIAGNOSIS — Z8739 Personal history of other diseases of the musculoskeletal system and connective tissue: Secondary | ICD-10-CM

## 2018-11-07 DIAGNOSIS — E1140 Type 2 diabetes mellitus with diabetic neuropathy, unspecified: Secondary | ICD-10-CM | POA: Diagnosis not present

## 2018-11-07 DIAGNOSIS — R600 Localized edema: Secondary | ICD-10-CM

## 2018-11-07 DIAGNOSIS — R601 Generalized edema: Secondary | ICD-10-CM | POA: Diagnosis not present

## 2018-11-07 MED ORDER — FUROSEMIDE 20 MG PO TABS: 20.0000 mg | ORAL_TABLET | Freq: Every day | ORAL | 3 refills | Status: DC | PRN

## 2018-11-07 NOTE — Patient Instructions (Signed)
F/U pending  Start lasix 20mg   Will get appointment with cardiology

## 2018-11-07 NOTE — Assessment & Plan Note (Signed)
NSR and rate controlled, BP also looks good

## 2018-11-07 NOTE — Progress Notes (Signed)
   Subjective:    Patient ID: Jimmy Bradshaw, male    DOB: 16-Mar-1946, 73 y.o.   MRN: [id number]  Patient presents for B Foot Edema (x1 month- states that he has been having edema in foot and ankle)  Patient here with bilateral foot edema ankle edema for the past 3 weeks.  States it has not gone down completely but has been worse than what is currently seen.  He does get occasional shortness of breath if he exerts himself a lot has not had any chest pain has not felt his atrial fibrillation.  He is taking all of his medications as prescribed.  He admits that he has been eating too much junk food and salt he has gained 10 pounds over the past 3 months.  He has not been checking his blood sugars.  He initially thought it may have been gout as the swelling was around his ankles but he is not had any severe pain and he is taking the allopurinol.  He did have stress testing done 2 years ago that time his ejection fraction was about 45 to 54%   Review Of Systems:  GEN- denies fatigue, fever, weight loss,weakness, recent illness HEENT- denies eye drainage, change in vision, nasal discharge, CVS- denies chest pain, palpitations RESP- denies SOB, cough, wheeze ABD- denies N/V, change in stools, abd pain GU- denies dysuria, hematuria, dribbling, incontinence MSK- denies joint pain, muscle aches, injury Neuro- denies headache, dizziness, syncope, seizure activity       Objective:    BP 122/68   Pulse 82   Temp 98.3 F (36.8 C) (Oral)   Resp 16   Ht 5\' 11"  (1.803 m)   Wt 228 lb (103.4 kg)   SpO2 96%   BMI 31.80 kg/m  GEN- NAD, alert and oriented x3 HEENT- PERRL, EOMI, non injected sclera, pink conjunctiva, MMM, oropharynx clear Neck- Supple, no JVD CVS- RRR, no murmur RESP-CTAB ABD-NABS,soft,NT,ND EXT- pitting edema to mid shins Pulses- Radial, DP- 2+        Assessment & Plan:      Problem List Items Addressed This Visit      Unprioritized   Atrial fibrillation (HCC)    NSR  and rate controlled, BP also looks good       Relevant Medications   furosemide (LASIX) 20 MG tablet   Other Relevant Orders   Hemoglobin A1c   H/O: gout   Relevant Orders   Uric Acid   Type 2 diabetes mellitus with diabetic neuropathy, unspecified (HCC)    Check A1C, goal less than 7% Discussed diet, eating too many carbs, sweets and salt containing foods, which can also lead to some of the edema       Relevant Orders   Hemoglobin A1c    Other Visit Diagnoses    Peripheral edema    -  Primary   Concern for possible new onset CHF or diastolic dysfunction since he does have  A fib already. Doubt this is gout based on distrubution of the swelling  Check labs and BNP Start lasix 20mg  once a day  Will get appointment with cardiology, also needs updated 2D Echo   Relevant Orders   CBC with Differential/Platelet   Comprehensive metabolic panel   Hemoglobin A1c   Brain natriuretic peptide      Note: This dictation was prepared with Dragon dictation along with smaller phrase technology. Any transcriptional errors that result from this process are unintentional.

## 2018-11-07 NOTE — Assessment & Plan Note (Signed)
Check A1C, goal less than 7% Discussed diet, eating too many carbs, sweets and salt containing foods, which can also lead to some of the edema

## 2018-11-08 ENCOUNTER — Other Ambulatory Visit: Payer: Self-pay | Admitting: *Deleted

## 2018-11-08 LAB — CBC WITH DIFFERENTIAL/PLATELET
Absolute Monocytes: 727 cells/uL (ref 200–950)
Basophils Absolute: 29 cells/uL (ref 0–200)
Basophils Relative: 0.4 %
Eosinophils Absolute: 101 cells/uL (ref 15–500)
Eosinophils Relative: 1.4 %
HCT: 40.4 % (ref 38.5–50.0)
Hemoglobin: 13.8 g/dL (ref 13.2–17.1)
Lymphs Abs: 1339 cells/uL (ref 850–3900)
MCH: 30.8 pg (ref 27.0–33.0)
MCHC: 34.2 g/dL (ref 32.0–36.0)
MCV: 90.2 fL (ref 80.0–100.0)
MPV: 12.2 fL (ref 7.5–12.5)
Monocytes Relative: 10.1 %
Neutro Abs: 5004 cells/uL (ref 1500–7800)
Neutrophils Relative %: 69.5 %
Platelets: 209 10*3/uL (ref 140–400)
RBC: 4.48 10*6/uL (ref 4.20–5.80)
RDW: 12.5 % (ref 11.0–15.0)
Total Lymphocyte: 18.6 %
WBC: 7.2 10*3/uL (ref 3.8–10.8)

## 2018-11-08 LAB — COMPREHENSIVE METABOLIC PANEL
AG Ratio: 2.5 (calc) (ref 1.0–2.5)
ALT: 16 U/L (ref 9–46)
AST: 17 U/L (ref 10–35)
Albumin: 4.5 g/dL (ref 3.6–5.1)
Alkaline phosphatase (APISO): 61 U/L (ref 35–144)
BUN: 19 mg/dL (ref 7–25)
CO2: 26 mmol/L (ref 20–32)
Calcium: 9.2 mg/dL (ref 8.6–10.3)
Chloride: 106 mmol/L (ref 98–110)
Creat: 1.07 mg/dL (ref 0.70–1.18)
Globulin: 1.8 g/dL (calc) — ABNORMAL LOW (ref 1.9–3.7)
Glucose, Bld: 95 mg/dL (ref 65–99)
Potassium: 4.4 mmol/L (ref 3.5–5.3)
Sodium: 140 mmol/L (ref 135–146)
Total Bilirubin: 0.7 mg/dL (ref 0.2–1.2)
Total Protein: 6.3 g/dL (ref 6.1–8.1)

## 2018-11-08 LAB — HEMOGLOBIN A1C
EAG (MMOL/L): 6.5 (calc)
Hgb A1c MFr Bld: 5.7 % of total Hgb — ABNORMAL HIGH (ref <5.7)
Mean Plasma Glucose: 117 (calc)

## 2018-11-08 LAB — URIC ACID: Uric Acid, Serum: 6.6 mg/dL (ref 4.0–8.0)

## 2018-11-08 LAB — BRAIN NATRIURETIC PEPTIDE: Brain Natriuretic Peptide: 347 pg/mL — ABNORMAL HIGH (ref <100)

## 2018-11-13 ENCOUNTER — Encounter: Payer: Self-pay | Admitting: Cardiovascular Disease

## 2018-11-13 ENCOUNTER — Ambulatory Visit: Payer: Medicare HMO | Admitting: Cardiovascular Disease

## 2018-11-13 VITALS — BP 120/74 | HR 56 | Ht 71.5 in | Wt 226.4 lb

## 2018-11-13 DIAGNOSIS — I10 Essential (primary) hypertension: Secondary | ICD-10-CM | POA: Diagnosis not present

## 2018-11-13 DIAGNOSIS — R600 Localized edema: Secondary | ICD-10-CM | POA: Diagnosis not present

## 2018-11-13 DIAGNOSIS — I480 Paroxysmal atrial fibrillation: Secondary | ICD-10-CM

## 2018-11-13 MED ORDER — METOPROLOL SUCCINATE ER 25 MG PO TB24: 25.0000 mg | ORAL_TABLET | Freq: Every day | ORAL | 3 refills | Status: DC

## 2018-11-13 MED ORDER — FLECAINIDE ACETATE 50 MG PO TABS: 50.0000 mg | ORAL_TABLET | Freq: Two times a day (BID) | ORAL | 3 refills | Status: DC

## 2018-11-13 NOTE — Progress Notes (Signed)
Patient ID: Jimmy Bradshaw, male   DOB: 03-23-1946, 73 y.o.   MRN: [id number]    Date:  11/13/2018   ID:  Jimmy Bradshaw, DOB 1946/04/14, MRN [id number]  PCP:  Alycia Rossetti, MD  Primary Cardiologist:  Draycen Leichter  Chief Complaint  Patient presents with  . Atrial Fibrillation   Problem list: 1. Paroxysmal atrial fibrillation  - CHADS2VASC score of 3  ( age, HTN, DM)  2. Hypertension 3. Hyperlipidemia 4. Diabetes mellitus 5. BPH - s/p TURP 6. S/p left TKA  - with supsequent infection of his prosthesis requiring re-do surgery .     Jimmy Bradshaw is a 73 y.o. male history of hypertension, hyperlipidemia, type 2 diabetes mellitus, anxiety, BPH. On 09/30/2014 he underwent TURP procedure and then a week later underwent irrigation and debridement of the left knee.  PICC line was placed and he was started on antibiotics for 4 weeks.  One of these surgeries patient supposedly went into atrial fibrillation with a rapid ventricular response. There are no EKGs or telemetry strips which confirmed this nor is there any documentation in the notes; either in the discharge summaries or with anesthesia.   The patient currently denies nausea, vomiting, fever, chest pain, shortness of breath, orthopnea, dizziness, PND, cough, congestion, abdominal pain, hematochezia, melena, lower extremity edema, claudication.   Feb. 19, 2016   He was getting his echo today and went in to rapid afib.  Still asymptomatic.  Was added on to my schedule.   November 21, 2014:  Jimmy Bradshaw is seen back today for follow up of his paroxysmal atrial fib.    We started him on Xarelto . Unfortunately, he had some bleeding in his urine .  He had just had recent prostate surgery ( [name]) .   He held his Xarelto for the next week.  He is doing better.   March 11, 2015: Jimmy Bradshaw has a hx of paroxysmal atrial fib. Has Eliquis but has not started it yet  His hematura has resolved.  Checks his pulse and has not had recurrent atrial fib to his  knowledge   Doing well.  Is having some leg pain.    Thinks it may be due to the pravachol  Still having knee problems .    Jan. 17, 2017:  Jimmy Bradshaw is doing well He has paroxysmal afib  - is back in Afib today - cannot tell that his HR is irregular.  No CP , no dyspnea.  He is not having any further urinary bleeding . His knee infection has resolved.   Jan. 30, 2018:  Jimmy Bradshaw is seen for visit  Dr. Buelah Manis increased the Toprol XL to 50 mg a day   January 10, 2017:  Jimmy Bradshaw is seen today for follow up of his atrial fib.  He had a cardioversion Feb. 6, 2018: GXT  Feb. 13, 2018 showed mild widening of his QRS with peak exercise. We decreased his flecainide from 100 mg twice a day to 50 mg twice a day at that point. In on Flecainide 50 mg BID , metoprolol 25 mg a day and Eliquis  Feels well Left knee replacement is working well   Dec. 19, 2018:  No CP or dysnea.   Maintaining  NSR  Is watching his diet.   Walks with dogs.    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  Having some vision issues.   Saw Dr. Sherrilyn Rist ( at [facility]) Being referred to [facility] for [place] eval   March 20, 2018:  He was seen today for follow-up of his paroxysmal atrial fibrillation, HTN, HLD .  Marland Kitchen  He has maintained normal sinus rhythm.  He is on flecainide 50 mg twice a day. Has been having some issues with his vision.  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    Monitoring for now  No CP , no dyspnea,   Exercising  Running dogs in agility training .  Bearded collies.   Feb. 24, 2020:  Jimmy Bradshaw is seen today for follow-up of his hypertension, hyperlipidemia, paroxysmal atrial fibrillation.  He apparently has had some fluid retention recently and was started on Lasix. His last echocardiogram was in 2016 which revealed normal left ventricular systolic function with an ejection fraction of 55 to 60%.  We were not able to assess his diastolic function because of the presence of atrial fibrillation.  He started  having swelling in his feet.  Right > left  Started improving ( even before starting lasix )  Started lasix  Does not think he eats too much salt  Exercises regularly ,  - 3 days a week does agility training   No chest pain  Just DOE after running the dogs.     Wt Readings from Last 3 Encounters:  11/13/18 226 lb 6.4 oz (102.7 kg)  11/07/18 228 lb (103.4 kg)  08/11/18 218 lb (98.9 kg)     Past Medical History:  Diagnosis Date  . Anxiety   . Arthritis    HANDS,  SHOULDERS  . Bladder neck obstruction   . BPH (benign prostatic hypertrophy)   . Dehydration 10/29/2017  . Frequency of urination   . H/O: gout   . History of colon polyps   . Hyperlipidemia   . Hypokalemia 10/28/2017  . Nocturia   . Type 2 diabetes mellitus ([place])   . Urgency of urination   . Wears hearing aid    BILATERAL    Current Outpatient Medications  Medication Sig Dispense Refill  . allopurinol (ZYLOPRIM) 100 MG tablet Take 1 tablet (100 mg total) by mouth daily. 90 tablet 3  . ELIQUIS 5 MG TABS tablet TAKE 1 TABLET TWICE DAILY 180 tablet 1  . flecainide (TAMBOCOR) 50 MG tablet Take 1 tablet (50 mg total) by mouth 2 (two) times daily. 180 tablet 3  . furosemide (LASIX) 20 MG tablet Take 1 tablet (20 mg total) by mouth daily as needed. 30 tablet 3  . gabapentin (NEURONTIN) 100 MG capsule TAKE 1 TO 3 CAPSULES AT BEDTIME FOR NERVE PAIN AS NEEDED 90 capsule 3  . lisinopril (PRINIVIL,ZESTRIL) 2.5 MG tablet Take 1 tablet (2.5 mg total) by mouth daily. 90 tablet 1  . metoprolol succinate (TOPROL-XL) 25 MG 24 hr tablet Take 1 tablet (25 mg total) by mouth daily. 90 tablet 3  . rosuvastatin (CRESTOR) 10 MG tablet TAKE 1 TABLET EVERY DAY 90 tablet 3   No current facility-administered medications for this visit.     Allergies:    Allergies  Allergen Reactions  . Pravastatin Other (See Comments)    Headache and legs hurt  . Penicillins Rash    Had penicillin shot w/o reaction 2018    Social History:  The  patient  reports that he quit smoking about 19 years ago. His smoking use included cigarettes. He quit after 4.00 years of use. He has never used smokeless tobacco. He reports current alcohol use of about 1.0 standard drinks of alcohol per week.  He reports that he does not use drugs.   Family history:   Family History  Adopted: Yes  Problem Relation Age of Onset  . Colon cancer Father     ROS:  Please see the history of present illness.  All other systems reviewed and negative.   Physical Exam: Blood pressure 120/74, pulse (!) 56, height 5' 11.5" (1.816 m), weight 226 lb 6.4 oz (102.7 kg), SpO2 97 %.  GEN:  Well nourished, well developed in no acute distress HEENT: Normal NECK: No JVD; No carotid bruits LYMPHATICS: No lymphadenopathy CARDIAC: RRR  RESPIRATORY:  Clear to auscultation without rales, wheezing or rhonchi  ABDOMEN: Soft, non-tender, non-distended MUSCULOSKELETAL:  No edema; No deformity  SKIN: Warm and dry NEUROLOGIC:  Alert and oriented x 3    EKG:   ASSESSMENT AND PLAN:   1.   Acute leg swelling:   Jimmy Bradshaw developed bilateral leg swelling several weeks ago .  He is usually pretty good good about watching his salt but he does admit to eating some cashews for the past several weeks.  He denies any angina.  Does not eat any canned foods.  His legs actually started going down even before he was started on Lasix but now that he is on Lasix 20 mg a day and his leg edema has completely resolved. We will get an echocardiogram for further evaluation of his cardiac function. If his left ventricular systolic function is normal, then I think we can change his Lasix to 20 mg a day as needed. I'll see him in 3 months   2. Paroxysmal Atrial fibrillation:  CHADS 2VASC score of 3 ( Age, HTN, DM)   . He is maintained normal sinus rhythm.  Continue Eliquis. He is scheduled to have cataract surgery in the near future.  I have given him the okay to hold his Eliquis for a day or so  prior to cataract surgery if his eye doctor requests.  Otherwise he is at low risk for this procedure.  3. Essential hypertension:   -Blood pressure is well controlled.  4. Hyperlipidemia:    Lipids look great.  Managed by primary care   5.   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 Is followed by Annia Belt, MD and at [facility] ( Dr. Frederica Kuster)  .    Thayer Headings, [name], MD, [facility] 11/13/2018, 12:17 PM [address]  [place] Pager [phone]

## 2018-11-13 NOTE — Patient Instructions (Signed)
Medication Instructions:  Your physician recommends that you continue on your current medications as directed. Please refer to the Current Medication list given to you today.  If you need a refill on your cardiac medications before your next appointment, please call your pharmacy.    Lab work: None Ordered   Testing/Procedures: Your physician has requested that you have an echocardiogram. Echocardiography is a painless test that uses sound waves to create images of your heart. It provides your doctor with information about the size and shape of your heart and how well your heart's chambers and valves are working. This procedure takes approximately one hour. There are no restrictions for this procedure.    Follow-Up: At [facility], you and your health needs are our priority.  As part of our continuing mission to provide you with exceptional heart care, we have created designated Provider Care Teams.  These Care Teams include your primary Cardiologist (physician) and Advanced Practice Providers (APPs -  Physician Assistants and Nurse Practitioners) who all work together to provide you with the care you need, when you need it. You will need a follow up appointment in:  3 months.  You may see Mertie Moores, MD or one of the following Advanced Practice Providers on your designated Care Team: Richardson Dopp, PA-C [place], [place] . Daune Perch, NP

## 2018-11-22 ENCOUNTER — Ambulatory Visit (HOSPITAL_COMMUNITY): Payer: Medicare HMO | Attending: Cardiology

## 2018-11-22 DIAGNOSIS — I480 Paroxysmal atrial fibrillation: Secondary | ICD-10-CM | POA: Insufficient documentation

## 2018-11-22 DIAGNOSIS — I10 Essential (primary) hypertension: Secondary | ICD-10-CM | POA: Diagnosis not present

## 2018-11-22 DIAGNOSIS — R600 Localized edema: Secondary | ICD-10-CM | POA: Diagnosis not present

## 2018-11-22 MED ORDER — PERFLUTREN LIPID MICROSPHERE
1.0000 mL | INTRAVENOUS | Status: AC | PRN
  Administered 2018-11-22: 2 mL via INTRAVENOUS

## 2018-12-12 ENCOUNTER — Other Ambulatory Visit: Payer: Self-pay | Admitting: Family Medicine

## 2018-12-19 ENCOUNTER — Other Ambulatory Visit: Payer: Self-pay | Admitting: Cardiovascular Disease

## 2019-01-02 ENCOUNTER — Encounter: Payer: Self-pay | Admitting: Family Medicine

## 2019-01-03 ENCOUNTER — Other Ambulatory Visit: Payer: Self-pay

## 2019-01-03 ENCOUNTER — Ambulatory Visit: Payer: Medicare HMO | Admitting: Family Medicine

## 2019-01-03 MED ORDER — PREDNISONE 10 MG PO TABS: ORAL_TABLET | ORAL | 0 refills | Status: DC

## 2019-01-23 ENCOUNTER — Other Ambulatory Visit: Payer: Self-pay | Admitting: Family Medicine

## 2019-01-29 ENCOUNTER — Telehealth: Payer: Self-pay | Admitting: Family Medicine

## 2019-01-29 ENCOUNTER — Other Ambulatory Visit: Payer: Self-pay | Admitting: Family Medicine

## 2019-01-29 MED ORDER — PREDNISONE 20 MG PO TABS: ORAL_TABLET | ORAL | 0 refills | Status: DC

## 2019-01-29 NOTE — Telephone Encounter (Signed)
Ok [facility] to [facility] was [facility].

## 2019-01-29 NOTE — Telephone Encounter (Signed)
Pt aware.

## 2019-01-29 NOTE — Telephone Encounter (Signed)
Patient talked with dr pickard yesterday and was told that prednisone was going to be called in for him  To [facility] [address], however it is not there, if it has not been called in he would rather it go to [facility]

## 2019-02-01 ENCOUNTER — Encounter: Payer: Self-pay | Admitting: Family Medicine

## 2019-02-01 ENCOUNTER — Ambulatory Visit (INDEPENDENT_AMBULATORY_CARE_PROVIDER_SITE_OTHER): Payer: Medicare HMO | Admitting: Family Medicine

## 2019-02-01 ENCOUNTER — Other Ambulatory Visit: Payer: Self-pay

## 2019-02-01 VITALS — BP 142/100 | HR 68 | Temp 98.0°F | Resp 18 | Ht 71.0 in | Wt 222.0 lb

## 2019-02-01 DIAGNOSIS — Z8739 Personal history of other diseases of the musculoskeletal system and connective tissue: Secondary | ICD-10-CM | POA: Diagnosis not present

## 2019-02-01 MED ORDER — ALLOPURINOL 300 MG PO TABS: 300.0000 mg | ORAL_TABLET | Freq: Every day | ORAL | 6 refills | Status: DC

## 2019-02-01 MED ORDER — COLCHICINE 0.6 MG PO TABS: 0.6000 mg | ORAL_TABLET | Freq: Every day | ORAL | 1 refills | Status: DC

## 2019-02-01 NOTE — Progress Notes (Signed)
Subjective:    Patient ID: Jimmy Bradshaw, male    DOB: 1945/12/24, 73 y.o.   MRN: [id number]  HPI Patient is being seen today for follow-up of gout flare.  He called on Sunday with severe pain in his right knee erythema and effusion.  He started prednisone and now the pain is completely gone.  However he states that he is getting 3 episodes of gout in the last 6 months.  He was taking allopurinol 100 mg a day however he stopped that on Monday when he got the gout flare.  In the past he took indomethacin at the first sign of a gout flare however due to his other medical comorbidities he is not able to take indomethacin any longer.  He is perplexed as what he can do to help prevent or control these flares in the future without having to take prednisone. Past Medical History:  Diagnosis Date  . Anxiety   . Arthritis    HANDS,  SHOULDERS  . Bladder neck obstruction   . BPH (benign prostatic hypertrophy)   . Dehydration 10/29/2017  . Frequency of urination   . H/O: gout   . History of colon polyps   . Hyperlipidemia   . Hypokalemia 10/28/2017  . Nocturia   . Type 2 diabetes mellitus ([place])   . Urgency of urination   . Wears hearing aid    BILATERAL   Past Surgical History:  Procedure Laterality Date  . CARDIOVERSION N/A 10/26/2016   Procedure: CARDIOVERSION;  Surgeon: Larey Dresser, MD;  Location: [facility];  Service: Cardiovascular;  Laterality: N/A;  . COLONOSCOPY  08-30-2011  . I&D KNEE WITH POLY EXCHANGE Left 10/08/2014   Procedure: IRRIGATION AND DEBRIDEMENT LEFT UNI KNEE ARTHOPLASTY  WITH POLY EXCHANGE;  Surgeon: Mauri Pole, MD;  Location: [facility];  Service: Orthopedics;  Laterality: Left;  . KNEE ARTHROSCOPY Left 11/ 2013  . PARTIAL KNEE ARTHROPLASTY Left 02/05/2013   Procedure: LEFT KNEE MEDIAL UNICOMPARTMENTAL KNEE;  Surgeon: Mauri Pole, MD;  Location: [facility];  Service: Orthopedics;  Laterality: Left;  . TRANSURETHRAL RESECTION OF PROSTATE N/A 09/30/2014   Procedure:  TRANSURETHRAL RESECTION OF THE PROSTATE WITH GYRUS INSTRUMENTS;  Surgeon: Bernestine Amass, MD;  Location: [facility];  Service: Urology;  Laterality: N/A;   Current Outpatient Medications on File Prior to Visit  Medication Sig Dispense Refill  . allopurinol (ZYLOPRIM) 100 MG tablet Take 1 tablet (100 mg total) by mouth daily. 90 tablet 3  . ELIQUIS 5 MG TABS tablet TAKE 1 TABLET TWICE DAILY 180 tablet 1  . flecainide (TAMBOCOR) 50 MG tablet Take 1 tablet (50 mg total) by mouth 2 (two) times daily. 180 tablet 3  . furosemide (LASIX) 20 MG tablet Take 1 tablet (20 mg total) by mouth daily as needed. 30 tablet 3  . gabapentin (NEURONTIN) 100 MG capsule TAKE 1 TO 3 CAPSULES AT BEDTIME FOR NERVE PAIN AS NEEDED 90 capsule 3  . lisinopril (PRINIVIL,ZESTRIL) 2.5 MG tablet TAKE 1 TABLET EVERY DAY 90 tablet 1  . metoprolol succinate (TOPROL-XL) 25 MG 24 hr tablet Take 1 tablet (25 mg total) by mouth daily. 90 tablet 3  . predniSONE (DELTASONE) 10 MG tablet Take 40mg  on days 1-2. Take 30mg  on days 3-4. Take 20mg  on days 5-6. Take 10mg  on days 7-8. Take 5mg  on days 9-10, then stop. 21 tablet 0  . predniSONE (DELTASONE) 20 MG tablet 3 tabs poqday 1-2, 2 tabs poqday 3-4, 1  tab poqday 5-6 12 tablet 0  . rosuvastatin (CRESTOR) 10 MG tablet TAKE 1 TABLET EVERY DAY 90 tablet 3   No current facility-administered medications on file prior to visit.    Allergies  Allergen Reactions  . Pravastatin Other (See Comments)    Headache and legs hurt  . Penicillins Rash    Had penicillin shot w/o reaction 2018   Social History   Socioeconomic History  . Marital status: Married    Spouse name: Not on file  . Number of children: Not on file  . Years of education: Not on file  . Highest education level: Not on file  Occupational History  . Not on file  Social Needs  . Financial resource strain: Not on file  . Food insecurity:    Worry: Not on file    Inability: Not on file  . Transportation  needs:    Medical: Not on file    Non-medical: Not on file  Tobacco Use  . Smoking status: Former Smoker    Years: 4.00    Types: Cigarettes    Last attempt to quit: 12/08/1998    Years since quitting: 20.1  . Smokeless tobacco: Never Used  Substance and Sexual Activity  . Alcohol use: Yes    Alcohol/week: 1.0 standard drinks    Types: 1 Glasses of wine per week    Comment: RARE - a sip, occassional  . Drug use: No  . Sexual activity: Not Currently  Lifestyle  . Physical activity:    Days per week: 0 days    Minutes per session: 0 min  . Stress: Not on file  Relationships  . Social connections:    Talks on phone: Not on file    Gets together: Not on file    Attends religious service: Not on file    Active member of club or organization: Not on file    Attends meetings of clubs or organizations: Not on file    Relationship status: Not on file  . Intimate partner violence:    Fear of current or ex partner: Not on file    Emotionally abused: Not on file    Physically abused: Not on file    Forced sexual activity: Not on file  Other Topics Concern  . Not on file  Social History Narrative  . Not on file     Review of Systems  All other systems reviewed and are negative.      Objective:   Physical Exam Vitals signs reviewed.  Constitutional:      Appearance: Normal appearance.  Cardiovascular:     Rate and Rhythm: Normal rate.     Heart sounds: Normal heart sounds.  Pulmonary:     Effort: Pulmonary effort is normal.     Breath sounds: Normal breath sounds. No wheezing, rhonchi or rales.  Musculoskeletal:     Right knee: He exhibits normal range of motion, no swelling and no effusion. No medial joint line and no lateral joint line tenderness noted.     Left ankle: He exhibits normal range of motion and no swelling. No lateral malleolus and no medial malleolus tenderness found.  Neurological:     Mental Status: He is alert.           Assessment & Plan:   H/O: gout  Patient is to most recent gout exacerbations in his left ankle and right knee have seemingly resolved.  I spent 20 minutes today with the patient explaining the natural  history of gout and how to focus on prevention.  I recommended increasing his allopurinol to 300 mg a day to try to drive his uric acid level less than 6.  While doing this, he will take colchicine 0.6 mg daily to prevent an exacerbation of gout while the uric acid level stabilizes.  After 2 months he is to return to recheck his uric acid level and once stable and less than 6 I will discontinue colchicine.  I also recommended that he could take colchicine 1.2 mg at the first sign of a gout attack similar to the way he took indomethacin in the past to help prevent the gout attack from worsening.  We also discussed dietary changes to help prevent gout

## 2019-02-05 ENCOUNTER — Encounter: Payer: Self-pay | Admitting: Family Medicine

## 2019-02-14 ENCOUNTER — Ambulatory Visit: Payer: Medicare HMO | Admitting: Physician Assistant

## 2019-02-21 ENCOUNTER — Ambulatory Visit (INDEPENDENT_AMBULATORY_CARE_PROVIDER_SITE_OTHER): Payer: Medicare HMO | Admitting: Family Medicine

## 2019-02-21 ENCOUNTER — Other Ambulatory Visit: Payer: Self-pay

## 2019-02-21 VITALS — Temp 98.2°F | Resp 18 | Ht 71.0 in | Wt 216.8 lb

## 2019-02-21 DIAGNOSIS — I480 Paroxysmal atrial fibrillation: Secondary | ICD-10-CM

## 2019-02-21 DIAGNOSIS — M1A09X0 Idiopathic chronic gout, multiple sites, without tophus (tophi): Secondary | ICD-10-CM

## 2019-02-21 DIAGNOSIS — E1140 Type 2 diabetes mellitus with diabetic neuropathy, unspecified: Secondary | ICD-10-CM | POA: Diagnosis not present

## 2019-02-21 DIAGNOSIS — R42 Dizziness and giddiness: Secondary | ICD-10-CM | POA: Diagnosis not present

## 2019-02-21 NOTE — Progress Notes (Signed)
   Subjective:    Patient ID: Jimmy Bradshaw, male    DOB: July 13, 1946, 73 y.o.   MRN: [id number]  Patient presents for Dizziness (x4 days, feels out of body, nausea, no vomiting)   Sunday feeling light headed and felt an out of body like experience improved a little Monday and Tuesday. Was able to drive to birthday party. Doesn't feel like his vertigo which he has had in the past Today feels good   had nauea, but no vomiting  Increasing water, avoiding pork/ beef due to recent gout flares requiring multiple roumds of meds and increase in allopurinol   DM- sugar has been good    Gout- allopuriol Still on colchcine for next 2 months     June 11th has appt for Cataract surgery in left eye/ will need special lenses     Review Of Systems:  GEN- denies fatigue, fever, weight loss,weakness, recent illness HEENT- denies eye drainage, change in vision, nasal discharge, CVS- denies chest pain, palpitations RESP- denies SOB, cough, wheeze ABD- denies N/V, change in stools, abd pain GU- denies dysuria, hematuria, dribbling, incontinence MSK- denies joint pain, muscle aches, injury Neuro- denies headache, +dizziness, syncope, seizure activity       Objective:    Temp 98.2 F (36.8 C)   Resp 18   Ht 5\' 11"  (1.803 m)   Wt 216 lb 12.8 oz (98.3 kg)   SpO2 96%   BMI 30.24 kg/m  GEN- NAD, alert and oriented x3 HEENT- PERRL, EOMI, non injected sclera, pink conjunctiva, MMM, oropharynx clear Neck- Supple, no bruit  CVS- RRR, no murmur RESP-CTAB ABD-NABS,soft,,NT,ND Neuro-CNII-XII in tact no focal deficits  EXT- No edema Pulses- Radial 2+  No orthostasis       Assessment & Plan:      Problem List Items Addressed This Visit      Unprioritized   Atrial fibrillation (HCC)    Currently in rhythm and BP controlled Feeling back to normal Unclear if dizziness was all the prednisone other meds adjusting, or could be related to vision, he does have lesion in his left eye they are  following Check lytes, A1C make sure nothing is off to cause symptoms either No change to meds Exam reassuring today       Gout    Continue allopurinol , colchicine overlap for 2 months, then d/c colchicine      Type 2 diabetes mellitus with diabetic neuropathy, unspecified (HCC)   Relevant Orders   CBC with Differential/Platelet (Completed)   Comprehensive metabolic panel (Completed)   Hemoglobin A1c (Completed)    Other Visit Diagnoses    Dizzy spells    -  Primary   Relevant Orders   CBC with Differential/Platelet (Completed)   Comprehensive metabolic panel (Completed)      Note: This dictation was prepared with Dragon dictation along with smaller phrase technology. Any transcriptional errors that result from this process are unintentional.

## 2019-02-21 NOTE — Patient Instructions (Addendum)
Continue colchcine for total of 2 months, then come in for uric acid level  F/U 4

## 2019-02-22 ENCOUNTER — Encounter: Payer: Self-pay | Admitting: Family Medicine

## 2019-02-22 LAB — CBC WITH DIFFERENTIAL/PLATELET
Absolute Monocytes: 510 cells/uL (ref 200–950)
Basophils Absolute: 30 cells/uL (ref 0–200)
Basophils Relative: 0.5 %
Eosinophils Absolute: 150 cells/uL (ref 15–500)
Eosinophils Relative: 2.5 %
HCT: 46.5 % (ref 38.5–50.0)
Hemoglobin: 15.7 g/dL (ref 13.2–17.1)
Lymphs Abs: 1428 cells/uL (ref 850–3900)
MCH: 30 pg (ref 27.0–33.0)
MCHC: 33.8 g/dL (ref 32.0–36.0)
MCV: 88.7 fL (ref 80.0–100.0)
MPV: 11.6 fL (ref 7.5–12.5)
Monocytes Relative: 8.5 %
Neutro Abs: 3882 cells/uL (ref 1500–7800)
Neutrophils Relative %: 64.7 %
Platelets: 194 10*3/uL (ref 140–400)
RBC: 5.24 10*6/uL (ref 4.20–5.80)
RDW: 13.3 % (ref 11.0–15.0)
Total Lymphocyte: 23.8 %
WBC: 6 10*3/uL (ref 3.8–10.8)

## 2019-02-22 LAB — COMPREHENSIVE METABOLIC PANEL
AG Ratio: 2 (calc) (ref 1.0–2.5)
ALT: 16 U/L (ref 9–46)
AST: 18 U/L (ref 10–35)
Albumin: 4.5 g/dL (ref 3.6–5.1)
Alkaline phosphatase (APISO): 60 U/L (ref 35–144)
BUN: 19 mg/dL (ref 7–25)
CO2: 27 mmol/L (ref 20–32)
Calcium: 9.7 mg/dL (ref 8.6–10.3)
Chloride: 105 mmol/L (ref 98–110)
Creat: 1.11 mg/dL (ref 0.70–1.18)
Globulin: 2.2 g/dL (calc) (ref 1.9–3.7)
Glucose, Bld: 132 mg/dL — ABNORMAL HIGH (ref 65–99)
Potassium: 4.5 mmol/L (ref 3.5–5.3)
Sodium: 141 mmol/L (ref 135–146)
Total Bilirubin: 1.1 mg/dL (ref 0.2–1.2)
Total Protein: 6.7 g/dL (ref 6.1–8.1)

## 2019-02-22 LAB — HEMOGLOBIN A1C
Hgb A1c MFr Bld: 6.4 % of total Hgb — ABNORMAL HIGH (ref <5.7)
Mean Plasma Glucose: 137 (calc)
eAG (mmol/L): 7.6 (calc)

## 2019-02-22 NOTE — Assessment & Plan Note (Signed)
Continue allopurinol , colchicine overlap for 2 months, then d/c colchicine

## 2019-02-22 NOTE — Assessment & Plan Note (Signed)
Currently in rhythm and BP controlled Feeling back to normal Unclear if dizziness was all the prednisone other meds adjusting, or could be related to vision, he does have lesion in his left eye they are following Check lytes, A1C make sure nothing is off to cause symptoms either No change to meds Exam reassuring today

## 2019-02-26 ENCOUNTER — Telehealth: Payer: Self-pay | Admitting: Nurse Practitioner

## 2019-02-26 NOTE — Telephone Encounter (Signed)
Called patient in response to message from staff that he did not want to schedule virtual visit. I have scheduled him to see Dr. Acie Fredrickson on 7/14, his next available date in the office. Patient states he needs Eliquis samples due to he will not receive the shipment of medication from [facility] before he will run out of medication. He is aware that samples will be available for him to pick up in the lobby. He thanked me for the call.

## 2019-02-28 ENCOUNTER — Other Ambulatory Visit: Payer: Self-pay

## 2019-02-28 MED ORDER — BUPROPION HCL ER (XL) 150 MG PO TB24: 150.0000 mg | ORAL_TABLET | Freq: Every day | ORAL | 1 refills | Status: DC

## 2019-02-28 NOTE — Telephone Encounter (Signed)
Please call pt and ask why he is requesting to restart wellbutrin, I am assuming due to stressors. But need to document Also he can not start back at  300mg  XL, he can start  150mg  XL Needs OV in 3-4 weeks after starting med as well, to see if dose needs to be adjusted at that point

## 2019-02-28 NOTE — Telephone Encounter (Signed)
Pt states he needs it due to being stressed and it helps calm him down. Pt has an appt with Dr. Dennard Schaumann on July 14th for a 2 month f/u. Pt wants to know if it can be addressed at that time or does he need an additional appt to come see you? Please advise.

## 2019-02-28 NOTE — Telephone Encounter (Addendum)
Will start wellbutrin  150mg  once a day XR  When he comes for shoulder injection, please let Dr.Pickard know if medication is helping or not, at that time can increase to 300mg  if needed.

## 2019-02-28 NOTE — Telephone Encounter (Signed)
Requested Prescriptions   Pending Prescriptions Disp Refills  . buPROPion (WELLBUTRIN XL) 300 MG 24 hr tablet 30 tablet 0    Sig: Take 1 tablet (300 mg total) by mouth every morning. prescibed by Dr Lorine Bears [facility] provider)    Notified pt of results and pt states he would like to go back on Wellbutrin 300 mg.   Last OV 02/21/2019 Last written 06/18/2016

## 2019-03-01 DIAGNOSIS — H2512 Age-related nuclear cataract, left eye: Secondary | ICD-10-CM | POA: Diagnosis not present

## 2019-03-07 ENCOUNTER — Other Ambulatory Visit: Payer: Self-pay | Admitting: Cardiovascular Disease

## 2019-03-27 ENCOUNTER — Other Ambulatory Visit: Payer: Self-pay | Admitting: Family Medicine

## 2019-03-27 DIAGNOSIS — D487 Neoplasm of uncertain behavior of other specified sites: Secondary | ICD-10-CM | POA: Diagnosis not present

## 2019-04-02 ENCOUNTER — Telehealth: Payer: Self-pay | Admitting: Cardiovascular Disease

## 2019-04-02 NOTE — Telephone Encounter (Signed)
New Message ° ° ° °Left message to confirm appt and answer COVID questions  °

## 2019-04-03 ENCOUNTER — Other Ambulatory Visit: Payer: Self-pay

## 2019-04-03 ENCOUNTER — Ambulatory Visit: Payer: Medicare HMO | Admitting: Cardiovascular Disease

## 2019-04-03 ENCOUNTER — Ambulatory Visit (INDEPENDENT_AMBULATORY_CARE_PROVIDER_SITE_OTHER): Payer: Medicare HMO | Admitting: Family Medicine

## 2019-04-03 ENCOUNTER — Encounter: Payer: Self-pay | Admitting: Family Medicine

## 2019-04-03 ENCOUNTER — Ambulatory Visit (INDEPENDENT_AMBULATORY_CARE_PROVIDER_SITE_OTHER): Payer: Medicare HMO | Admitting: Cardiovascular Disease

## 2019-04-03 ENCOUNTER — Encounter: Payer: Self-pay | Admitting: Cardiovascular Disease

## 2019-04-03 VITALS — BP 110/70 | HR 55 | Temp 98.3°F | Resp 16 | Ht 71.0 in | Wt 226.0 lb

## 2019-04-03 VITALS — BP 118/84 | HR 81 | Ht 71.0 in | Wt 226.2 lb

## 2019-04-03 DIAGNOSIS — Z8739 Personal history of other diseases of the musculoskeletal system and connective tissue: Secondary | ICD-10-CM | POA: Diagnosis not present

## 2019-04-03 DIAGNOSIS — I483 Typical atrial flutter: Secondary | ICD-10-CM | POA: Diagnosis not present

## 2019-04-03 DIAGNOSIS — E119 Type 2 diabetes mellitus without complications: Secondary | ICD-10-CM | POA: Diagnosis not present

## 2019-04-03 DIAGNOSIS — I480 Paroxysmal atrial fibrillation: Secondary | ICD-10-CM | POA: Diagnosis not present

## 2019-04-03 DIAGNOSIS — I4892 Unspecified atrial flutter: Secondary | ICD-10-CM | POA: Insufficient documentation

## 2019-04-03 MED ORDER — FLECAINIDE ACETATE 50 MG PO TABS: 100.0000 mg | ORAL_TABLET | Freq: Two times a day (BID) | ORAL | 3 refills | Status: DC

## 2019-04-03 MED ORDER — BUPROPION HCL ER (XL) 150 MG PO TB24: 150.0000 mg | ORAL_TABLET | Freq: Every day | ORAL | 3 refills | Status: DC

## 2019-04-03 MED ORDER — ALLOPURINOL 300 MG PO TABS: 300.0000 mg | ORAL_TABLET | Freq: Every day | ORAL | 3 refills | Status: DC

## 2019-04-03 NOTE — Progress Notes (Signed)
Subjective:    Patient ID: Jimmy Bradshaw, male    DOB: 10-02-1945, 73 y.o.   MRN: [id number]  HPI  02/01/19 Patient is being seen today for follow-up of gout flare.  He called on Sunday with severe pain in his right knee erythema and effusion.  He started prednisone and now the pain is completely gone.  However he states that he is getting 3 episodes of gout in the last 6 months.  He was taking allopurinol 100 mg a day however he stopped that on Monday when he got the gout flare.  In the past he took indomethacin at the first sign of a gout flare however due to his other medical comorbidities he is not able to take indomethacin any longer.  He is perplexed as what he can do to help prevent or control these flares in the future without having to take prednisone.  At that time, my plan was: Patient is to most recent gout exacerbations in his left ankle and right knee have seemingly resolved.  I spent 20 minutes today with the patient explaining the natural history of gout and how to focus on prevention.  I recommended increasing his allopurinol to 300 mg a day to try to drive his uric acid level less than 6.  While doing this, he will take colchicine 0.6 mg daily to prevent an exacerbation of gout while the uric acid level stabilizes.  After 2 months he is to return to recheck his uric acid level and once stable and less than 6 I will discontinue colchicine.  I also recommended that he could take colchicine 1.2 mg at the first sign of a gout attack similar to the way he took indomethacin in the past to help prevent the gout attack from worsening.  We also discussed dietary changes to help prevent gout  04/03/19 Patient is here today for follow-up.  He is doing well on allopurinol.  He denies any side effects from the medication.  He denies any rash.  He has not had a gout exacerbation since I last saw him.  He is still taking allopurinol 300 mg a day coupled with colchicine 0.6 mg daily.  His PCP recently  put him on Wellbutrin.  He seems to be doing well on that and would like a 90-day supply.  He denies any issues with depression today currently in the office. Past Medical History:  Diagnosis Date  . Anxiety   . Arthritis    HANDS,  SHOULDERS  . Bladder neck obstruction   . BPH (benign prostatic hypertrophy)   . Dehydration 10/29/2017  . Frequency of urination   . H/O: gout   . History of colon polyps   . Hyperlipidemia   . Hypokalemia 10/28/2017  . Nocturia   . Type 2 diabetes mellitus ([place])   . Urgency of urination   . Wears hearing aid    BILATERAL   Past Surgical History:  Procedure Laterality Date  . CARDIOVERSION N/A 10/26/2016   Procedure: CARDIOVERSION;  Surgeon: Larey Dresser, MD;  Location: [place];  Service: Cardiovascular;  Laterality: N/A;  . COLONOSCOPY  08-30-2011  . I&D KNEE WITH POLY EXCHANGE Left 10/08/2014   Procedure: IRRIGATION AND DEBRIDEMENT LEFT UNI KNEE ARTHOPLASTY  WITH POLY EXCHANGE;  Surgeon: Mauri Pole, MD;  Location: [facility];  Service: Orthopedics;  Laterality: Left;  . KNEE ARTHROSCOPY Left 11/ 2013  . PARTIAL KNEE ARTHROPLASTY Left 02/05/2013   Procedure: LEFT KNEE MEDIAL UNICOMPARTMENTAL KNEE;  Surgeon: Mauri Pole, MD;  Location: [facility];  Service: Orthopedics;  Laterality: Left;  . TRANSURETHRAL RESECTION OF PROSTATE N/A 09/30/2014   Procedure: TRANSURETHRAL RESECTION OF THE PROSTATE WITH GYRUS INSTRUMENTS;  Surgeon: Bernestine Amass, MD;  Location: [facility];  Service: Urology;  Laterality: N/A;   Current Outpatient Medications on File Prior to Visit  Medication Sig Dispense Refill  . allopurinol (ZYLOPRIM) 300 MG tablet Take 1 tablet (300 mg total) by mouth daily. 30 tablet 6  . COLCRYS 0.6 MG tablet TAKE ONE TABLET DAILY 30 tablet 1  . ELIQUIS 5 MG TABS tablet TAKE 1 TABLET TWICE DAILY 180 tablet 1  . flecainide (TAMBOCOR) 50 MG tablet TAKE ONE TABLET TWICE DAILY 180 tablet 2  . furosemide (LASIX) 20 MG tablet Take 1  tablet (20 mg total) by mouth daily as needed. 30 tablet 3  . gabapentin (NEURONTIN) 100 MG capsule TAKE 1 TO 3 CAPSULES AT BEDTIME FOR NERVE PAIN AS NEEDED 90 capsule 3  . lisinopril (PRINIVIL,ZESTRIL) 2.5 MG tablet TAKE 1 TABLET EVERY DAY 90 tablet 1  . metoprolol succinate (TOPROL-XL) 25 MG 24 hr tablet Take 1 tablet (25 mg total) by mouth daily. 90 tablet 3  . rosuvastatin (CRESTOR) 10 MG tablet TAKE 1 TABLET EVERY DAY 90 tablet 3   No current facility-administered medications on file prior to visit.    Allergies  Allergen Reactions  . Pravastatin Other (See Comments)    Headache and legs hurt  . Penicillins Rash    Had penicillin shot w/o reaction 2018   Social History   Socioeconomic History  . Marital status: Married    Spouse name: Not on file  . Number of children: Not on file  . Years of education: Not on file  . Highest education level: Not on file  Occupational History  . Not on file  Social Needs  . Financial resource strain: Not on file  . Food insecurity    Worry: Not on file    Inability: Not on file  . Transportation needs    Medical: Not on file    Non-medical: Not on file  Tobacco Use  . Smoking status: Former Smoker    Years: 4.00    Types: Cigarettes    Quit date: 12/08/1998    Years since quitting: 20.3  . Smokeless tobacco: Never Used  Substance and Sexual Activity  . Alcohol use: Yes    Alcohol/week: 1.0 standard drinks    Types: 1 Glasses of wine per week    Comment: RARE - a sip, occassional  . Drug use: No  . Sexual activity: Not Currently  Lifestyle  . Physical activity    Days per week: 0 days    Minutes per session: 0 min  . Stress: Not on file  Relationships  . Social Herbalist on phone: Not on file    Gets together: Not on file    Attends religious service: Not on file    Active member of club or organization: Not on file    Attends meetings of clubs or organizations: Not on file    Relationship status: Not on file   . Intimate partner violence    Fear of current or ex partner: Not on file    Emotionally abused: Not on file    Physically abused: Not on file    Forced sexual activity: Not on file  Other Topics Concern  . Not on file  Social  History Narrative  . Not on file     Review of Systems  All other systems reviewed and are negative.      Objective:   Physical Exam Vitals signs reviewed.  Constitutional:      Appearance: Normal appearance.  Cardiovascular:     Rate and Rhythm: Normal rate.     Heart sounds: Normal heart sounds.  Pulmonary:     Effort: Pulmonary effort is normal.     Breath sounds: Normal breath sounds. No wheezing, rhonchi or rales.  Musculoskeletal:     Right knee: He exhibits normal range of motion, no swelling and no effusion. No medial joint line and no lateral joint line tenderness noted.     Left ankle: He exhibits normal range of motion and no swelling. No lateral malleolus and no medial malleolus tenderness found.  Neurological:     Mental Status: He is alert.           Assessment & Plan:  1. History of gout Continue allopurinol 300 mg.  Check uric acid level.  If uric acid level is less than 6 we will continue allopurinol at 300 mg and discontinue colchicine.  He can use colchicine then only as needed for breakthrough gout exacerbations which hopefully will be less frequent on the allopurinol.  Also refilled his Wellbutrin per his PCPs request to save the patient a trip back to the office. - COMPLETE METABOLIC PANEL WITH GFR - Uric acid

## 2019-04-03 NOTE — Patient Instructions (Addendum)
Medication Instructions:  Your physician has recommended you make the following change in your medication:  INCREASE Flecainide to 100 mg twice daily until Friday  If you need a refill on your cardiac medications before your next appointment, please call your pharmacy.    Lab work: None Ordered    Testing/Procedures: None Ordered   Follow-Up: Your physician recommends that you schedule a follow-up appointment on Friday July 17 at 11:45 am for Nurse visit/EKG   At [facility], you and your health needs are our priority.  As part of our continuing mission to provide you with exceptional heart care, we have created designated Provider Care Teams.  These Care Teams include your primary Cardiologist (physician) and Advanced Practice Providers (APPs -  Physician Assistants and Nurse Practitioners) who all work together to provide you with the care you need, when you need it. You will need a follow up appointment in:  3 months.  You may see Mertie Moores, MD or one of the following Advanced Practice Providers on your designated Care Team: Richardson Dopp, PA-C [place], [place] . Daune Perch, NP

## 2019-04-03 NOTE — H&P (View-Only) (Signed)
Patient ID: Jimmy Bradshaw, male   DOB: [birth year], 73 y.o.   MRN: [id number]    Date:  04/03/2019   ID:  Jimmy Bradshaw, DOB [birth year], MRN [id number]  PCP:  Alycia Rossetti, MD  Primary Cardiologist:    Chief Complaint  Patient presents with  . Atrial Flutter   Problem list: 1. Paroxysmal atrial fibrillation  - CHADS2VASC score of 3  ( age, HTN, DM)  2. Hypertension 3. Hyperlipidemia 4. Diabetes mellitus 5. BPH - s/p TURP 6. S/p left TKA  - with supsequent infection of his prosthesis requiring re-do surgery .     Jimmy Bradshaw is a 73 y.o. male history of hypertension, hyperlipidemia, type 2 diabetes mellitus, anxiety, BPH. On 09/30/2014 he underwent TURP procedure and then a week later underwent irrigation and debridement of the left knee.  PICC line was placed and he was started on antibiotics for 4 weeks.  One of these surgeries patient supposedly went into atrial fibrillation with a rapid ventricular response. There are no EKGs or telemetry strips which confirmed this nor is there any documentation in the notes; either in the discharge summaries or with anesthesia.   The patient currently denies nausea, vomiting, fever, chest pain, shortness of breath, orthopnea, dizziness, PND, cough, congestion, abdominal pain, hematochezia, melena, lower extremity edema, claudication.   Feb. 19, 2016   He was getting his echo today and went in to rapid afib.  Still asymptomatic.  Was added on to my schedule.   November 21, 2014:  Jimmy Bradshaw is seen back today for follow up of his paroxysmal atrial fib.    We started him on Xarelto . Unfortunately, he had some bleeding in his urine .  He had just had recent prostate surgery ( [name]) .   He held his Xarelto for the next week.  He is doing better.   March 11, 2015: Jimmy Bradshaw has a hx of paroxysmal atrial fib. Has Eliquis but has not started it yet  His hematura has resolved.  Checks his pulse and has not had recurrent atrial fib to his  knowledge   Doing well.  Is having some leg pain.    Thinks it may be due to the pravachol  Still having knee problems .    Jan. 17, 2017:  Jimmy Bradshaw is doing well He has paroxysmal afib  - is back in Afib today - cannot tell that his HR is irregular.  No CP , no dyspnea.  He is not having any further urinary bleeding . His knee infection has resolved.   Jan. 30, 2018:  Jimmy Bradshaw is seen for visit  Dr. Buelah Manis increased the Toprol XL to 50 mg a day   January 10, 2017:  Jimmy Bradshaw is seen today for follow up of his atrial fib.  He had a cardioversion Feb. 6, 2018: GXT  Feb. 13, 2018 showed mild widening of his QRS with peak exercise. We decreased his flecainide from 100 mg twice a day to 50 mg twice a day at that point. In on Flecainide 50 mg BID , metoprolol 25 mg a day and Eliquis  Feels well Left knee replacement is working well   Dec. 19, 2018:  No CP or dysnea.   Maintaining  NSR  Is watching his diet.   Walks with dogs.    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  Having some vision issues.   Saw Dr. Sherrilyn Rist ( at [facility]) Being referred to [facility] for [place] eval   March 20, 2018:  He was seen today for follow-up of his paroxysmal atrial fibrillation, HTN, HLD .  Marland Kitchen  He has maintained normal sinus rhythm.  He is on flecainide 50 mg twice a day. Has been having some issues with his vision.  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    Monitoring for now  No CP , no dyspnea,   Exercising  Running dogs in agility training .  Bearded collies.   Feb. 24, 2020:  Jimmy Bradshaw is seen today for follow-up of his hypertension, hyperlipidemia, paroxysmal atrial fibrillation.  He apparently has had some fluid retention recently and was started on Lasix. His last echocardiogram was in 2016 which revealed normal left ventricular systolic function with an ejection fraction of 55 to 60%.  We were not able to assess his diastolic function because of the presence of atrial fibrillation.  He started  having swelling in his feet.  Right > left  Started improving ( even before starting lasix )  Started lasix  Does not think he eats too much salt  Exercises regularly ,  - 3 days a week does agility training   No chest pain  Just DOE after running the dogs.   April 03, 2019: Jimmy Bradshaw is seen today for a follow-up visit.  He is back in atrial flutter today. He is completely asymptomatic.  He is been doing well.  He does his normal activities without any difficulty.  Blood pressure and heart rate are normal. Had 2 glasses of tea last night No Etoh recently  No missed dose of flecainide   Wt Readings from Last 3 Encounters:  04/03/19 226 lb 3.2 oz (102.6 kg)  04/03/19 226 lb (102.5 kg)  02/21/19 216 lb 12.8 oz (98.3 kg)     Past Medical History:  Diagnosis Date  . Anxiety   . Arthritis    HANDS,  SHOULDERS  . Bladder neck obstruction   . BPH (benign prostatic hypertrophy)   . Dehydration 10/29/2017  . Frequency of urination   . H/O: gout   . History of colon polyps   . Hyperlipidemia   . Hypokalemia 10/28/2017  . Nocturia   . Type 2 diabetes mellitus ([place])   . Urgency of urination   . Wears hearing aid    BILATERAL    Current Outpatient Medications  Medication Sig Dispense Refill  . allopurinol (ZYLOPRIM) 300 MG tablet Take 1 tablet (300 mg total) by mouth daily. 90 tablet 3  . buPROPion (WELLBUTRIN XL) 150 MG 24 hr tablet Take 1 tablet (150 mg total) by mouth daily. 90 tablet 3  . COLCRYS 0.6 MG tablet TAKE ONE TABLET DAILY 30 tablet 1  . ELIQUIS 5 MG TABS tablet TAKE 1 TABLET TWICE DAILY 180 tablet 1  . flecainide (TAMBOCOR) 50 MG tablet TAKE ONE TABLET TWICE DAILY 180 tablet 2  . gabapentin (NEURONTIN) 100 MG capsule TAKE 1 TO 3 CAPSULES AT BEDTIME FOR NERVE PAIN AS NEEDED 90 capsule 3  . lisinopril (PRINIVIL,ZESTRIL) 2.5 MG tablet TAKE 1 TABLET EVERY DAY 90 tablet 1  . metoprolol succinate (TOPROL-XL) 25 MG 24 hr tablet Take 1 tablet (25 mg total) by mouth daily. 90  tablet 3  . rosuvastatin (CRESTOR) 10 MG tablet TAKE 1 TABLET EVERY DAY 90 tablet 3   No current facility-administered medications for this visit.     Allergies:    Allergies  Allergen Reactions  . Pravastatin Other (See Comments)  Headache and legs hurt  . Penicillins Rash    Had penicillin shot w/o reaction 2018    Social History:  The patient  reports that he quit smoking about 20 years ago. His smoking use included cigarettes. He quit after 4.00 years of use. He has never used smokeless tobacco. He reports current alcohol use of about 1.0 standard drinks of alcohol per week. He reports that he does not use drugs.   Family history:   Family History  Adopted: Yes  Problem Relation Age of Onset  . Colon cancer Father     ROS:  Please see the history of present illness.  All other systems reviewed and negative.   Physical Exam: Blood pressure 118/84, pulse 81, height 5\' 11"  (1.803 m), weight 226 lb 3.2 oz (102.6 kg), SpO2 97 %.  GEN:  Well nourished, well developed in no acute distress HEENT: Normal NECK: No JVD; No carotid bruits LYMPHATICS: No lymphadenopathy CARDIAC: RRR with occasional irreg.  RESPIRATORY:  Clear to auscultation without rales, wheezing or rhonchi  ABDOMEN: Soft, non-tender, non-distended MUSCULOSKELETAL:  No edema; No deformity  SKIN: Warm and dry NEUROLOGIC:  Alert and oriented x 3   EKG:    April 03, 2019: Atrial flutter with at a heart rate of 70.  Variable AV block. ASSESSMENT AND PLAN:   1.   Acute leg swelling:     2. Paroxysmal Atrial fibrillation:  CHADS 2VASC score of 3 ( Age, HTN, DM) .  He is back in atrial flutter.  His heart rate is well controlled.  He is currently on flecainide 50 mg twice a day.  In the past we have had him on flecainide 100 mg twice a day.  This dose did cause some slight QRS widening during his stress test.  I think will be safe for him to take this dose for the next several days.  Hopefully we can get him back  into sinus rhythm.  We will increase the flecainide to 100 mg twice a day for the next 3 to 4 days.  He will return on Friday for a nurse visit EKG.  We will make a decision at that point whether to schedule him for a cardioversion or not.  If he is converted back to sinus rhythm I would like to lower his dose of flecainide back down to 50 mg twice a day for maintenance.  He thinks that it might be the 2 glasses of iced tea that he had last night that might of kicked him into atrial flutter.  We can consider referral to electrophysiology for flutter ablation if needed.   3. Essential hypertension:   BP is well controlled.   4. Hyperlipidemia:    Lipids look great.  Managed by primary care   5.   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 Is followed by Annia Belt, MD and at [facility] ( Dr. Frederica Kuster)  .    Thayer Headings, [name], MD, [facility] 04/03/2019, 3:10 PM [address],  [place] Pager [phone]

## 2019-04-03 NOTE — Progress Notes (Signed)
Patient ID: Jimmy Bradshaw, male   DOB: 1945-12-31, 73 y.o.   MRN: [id number]    Date:  04/03/2019   ID:  Jimmy Bradshaw, DOB 1946-04-27, MRN [id number]  PCP:  Alycia Rossetti, MD  Primary Cardiologist:  Nah[place]  Chief Complaint  Patient presents with  . Atrial Flutter   Problem list: 1. Paroxysmal atrial fibrillation  - CHADS2VASC score of 3  ( age, HTN, DM)  2. Hypertension 3. Hyperlipidemia 4. Diabetes mellitus 5. BPH - s/p TURP 6. S/p left TKA  - with supsequent infection of his prosthesis requiring re-do surgery .     Jimmy Bradshaw is a 73 y.o. male history of hypertension, hyperlipidemia, type 2 diabetes mellitus, anxiety, BPH. On 09/30/2014 he underwent TURP procedure and then a week later underwent irrigation and debridement of the left knee.  PICC line was placed and he was started on antibiotics for 4 weeks.  One of these surgeries patient supposedly went into atrial fibrillation with a rapid ventricular response. There are no EKGs or telemetry strips which confirmed this nor is there any documentation in the notes; either in the discharge summaries or with anesthesia.   The patient currently denies nausea, vomiting, fever, chest pain, shortness of breath, orthopnea, dizziness, PND, cough, congestion, abdominal pain, hematochezia, melena, lower extremity edema, claudication.   Feb. 19, 2016   He was getting his echo today and went in to rapid afib.  Still asymptomatic.  Was added on to my schedule.   November 21, 2014:  Jimmy Bradshaw is seen back today for follow up of his paroxysmal atrial fib.    We started him on Xarelto . Unfortunately, he had some bleeding in his urine .  He had just had recent prostate surgery ( [name]) .   He held his Xarelto for the next week.  He is doing better.   March 11, 2015: Jimmy Bradshaw has a hx of paroxysmal atrial fib. Has Eliquis but has not started it yet  His hematura has resolved.  Checks his pulse and has not had recurrent atrial fib to his  knowledge   Doing well.  Is having some leg pain.    Thinks it may be due to the pravachol  Still having knee problems .    Jan. 17, 2017:  Jimmy Bradshaw is doing well He has paroxysmal afib  - is back in Afib today - cannot tell that his HR is irregular.  No CP , no dyspnea.  He is not having any further urinary bleeding . His knee infection has resolved.   Jan. 30, 2018:  Jimmy Bradshaw is seen for visit  Dr. Buelah Manis increased the Toprol XL to 50 mg a day   January 10, 2017:  Jimmy Bradshaw is seen today for follow up of his atrial fib.  He had a cardioversion Feb. 6, 2018: GXT  Feb. 13, 2018 showed mild widening of his QRS with peak exercise. We decreased his flecainide from 100 mg twice a day to 50 mg twice a day at that point. In on Flecainide 50 mg BID , metoprolol 25 mg a day and Eliquis  Feels well Left knee replacement is working well   Dec. 19, 2018:  No CP or dysnea.   Maintaining  NSR  Is watching his diet.   Walks with dogs.    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  Having some vision issues.   Saw Dr. Sherrilyn Rist ( at [facility]) Being referred to [facility] for [place] eval   March 20, 2018:  He was seen today for follow-up of his paroxysmal atrial fibrillation, HTN, HLD .  Jimmy Bradshaw  He has maintained normal sinus rhythm.  He is on flecainide 50 mg twice a day. Has been having some issues with his vision.  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    Monitoring for now  No CP , no dyspnea,   Exercising  Running dogs in agility training .  Bearded collies.   Feb. 24, 2020:  Jimmy Bradshaw is seen today for follow-up of his hypertension, hyperlipidemia, paroxysmal atrial fibrillation.  He apparently has had some fluid retention recently and was started on Lasix. His last echocardiogram was in 2016 which revealed normal left ventricular systolic function with an ejection fraction of 55 to 60%.  We were not able to assess his diastolic function because of the presence of atrial fibrillation.  He started  having swelling in his feet.  Right > left  Started improving ( even before starting lasix )  Started lasix  Does not think he eats too much salt  Exercises regularly ,  - 3 days a week does agility training   No chest pain  Just DOE after running the dogs.   April 03, 2019: Jimmy Bradshaw is seen today for a follow-up visit.  He is back in atrial flutter today. He is completely asymptomatic.  He is been doing well.  He does his normal activities without any difficulty.  Blood pressure and heart rate are normal. Had 2 glasses of tea last night No Etoh recently  No missed dose of flecainide   Wt Readings from Last 3 Encounters:  04/03/19 226 lb 3.2 oz (102.6 kg)  04/03/19 226 lb (102.5 kg)  02/21/19 216 lb 12.8 oz (98.3 kg)     Past Medical History:  Diagnosis Date  . Anxiety   . Arthritis    HANDS,  SHOULDERS  . Bladder neck obstruction   . BPH (benign prostatic hypertrophy)   . Dehydration 10/29/2017  . Frequency of urination   . H/O: gout   . History of colon polyps   . Hyperlipidemia   . Hypokalemia 10/28/2017  . Nocturia   . Type 2 diabetes mellitus ([place])   . Urgency of urination   . Wears hearing aid    BILATERAL    Current Outpatient Medications  Medication Sig Dispense Refill  . allopurinol (ZYLOPRIM) 300 MG tablet Take 1 tablet (300 mg total) by mouth daily. 90 tablet 3  . buPROPion (WELLBUTRIN XL) 150 MG 24 hr tablet Take 1 tablet (150 mg total) by mouth daily. 90 tablet 3  . COLCRYS 0.6 MG tablet TAKE ONE TABLET DAILY 30 tablet 1  . ELIQUIS 5 MG TABS tablet TAKE 1 TABLET TWICE DAILY 180 tablet 1  . flecainide (TAMBOCOR) 50 MG tablet TAKE ONE TABLET TWICE DAILY 180 tablet 2  . gabapentin (NEURONTIN) 100 MG capsule TAKE 1 TO 3 CAPSULES AT BEDTIME FOR NERVE PAIN AS NEEDED 90 capsule 3  . lisinopril (PRINIVIL,ZESTRIL) 2.5 MG tablet TAKE 1 TABLET EVERY DAY 90 tablet 1  . metoprolol succinate (TOPROL-XL) 25 MG 24 hr tablet Take 1 tablet (25 mg total) by mouth daily. 90  tablet 3  . rosuvastatin (CRESTOR) 10 MG tablet TAKE 1 TABLET EVERY DAY 90 tablet 3   No current facility-administered medications for this visit.     Allergies:    Allergies  Allergen Reactions  . Pravastatin Other (See Comments)  Headache and legs hurt  . Penicillins Rash    Had penicillin shot w/o reaction 2018    Social History:  The patient  reports that he quit smoking about 20 years ago. His smoking use included cigarettes. He quit after 4.00 years of use. He has never used smokeless tobacco. He reports current alcohol use of about 1.0 standard drinks of alcohol per week. He reports that he does not use drugs.   Family history:   Family History  Adopted: Yes  Problem Relation Age of Onset  . Colon cancer Father     ROS:  Please see the history of present illness.  All other systems reviewed and negative.   Physical Exam: Blood pressure 118/84, pulse 81, height 5\' 11"  (1.803 m), weight 226 lb 3.2 oz (102.6 kg), SpO2 97 %.  GEN:  Well nourished, well developed in no acute distress HEENT: Normal NECK: No JVD; No carotid bruits LYMPHATICS: No lymphadenopathy CARDIAC: RRR with occasional irreg.  RESPIRATORY:  Clear to auscultation without rales, wheezing or rhonchi  ABDOMEN: Soft, non-tender, non-distended MUSCULOSKELETAL:  No edema; No deformity  SKIN: Warm and dry NEUROLOGIC:  Alert and oriented x 3   EKG:    April 03, 2019: Atrial flutter with at a heart rate of 70.  Variable AV block. ASSESSMENT AND PLAN:   1.   Acute leg swelling:     2. Paroxysmal Atrial fibrillation:  CHADS 2VASC score of 3 ( Age, HTN, DM) .  He is back in atrial flutter.  His heart rate is well controlled.  He is currently on flecainide 50 mg twice a day.  In the past we have had him on flecainide 100 mg twice a day.  This dose did cause some slight QRS widening during his stress test.  I think will be safe for him to take this dose for the next several days.  Hopefully we can get him back  into sinus rhythm.  We will increase the flecainide to 100 mg twice a day for the next 3 to 4 days.  He will return on Friday for a nurse visit EKG.  We will make a decision at that point whether to schedule him for a cardioversion or not.  If he is converted back to sinus rhythm I would like to lower his dose of flecainide back down to 50 mg twice a day for maintenance.  He thinks that it might be the 2 glasses of iced tea that he had last night that might of kicked him into atrial flutter.  We can consider referral to electrophysiology for flutter ablation if needed.   3. Essential hypertension:   BP is well controlled.   4. Hyperlipidemia:    Lipids look great.  Managed by primary care   5.   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 Is followed by Annia Belt, MD and at [facility] ( Dr. Frederica Kuster)  .    Thayer Headings, [name], MD, [facility] 04/03/2019, 3:10 PM [address],  [place] Pager [phone]

## 2019-04-04 LAB — COMPLETE METABOLIC PANEL WITH GFR
AG Ratio: 2.2 (calc) (ref 1.0–2.5)
ALT: 19 U/L (ref 9–46)
AST: 22 U/L (ref 10–35)
Albumin: 4.3 g/dL (ref 3.6–5.1)
Alkaline phosphatase (APISO): 52 U/L (ref 35–144)
BUN: 25 mg/dL (ref 7–25)
CO2: 28 mmol/L (ref 20–32)
Calcium: 9.6 mg/dL (ref 8.6–10.3)
Chloride: 105 mmol/L (ref 98–110)
Creat: 1 mg/dL (ref 0.70–1.18)
GFR, Est African American: 87 mL/min/{1.73_m2} (ref >=60)
GFR, Est Non African American: 75 mL/min/{1.73_m2} (ref >=60)
Globulin: 2 g/dL (calc) (ref 1.9–3.7)
Glucose, Bld: 119 mg/dL — ABNORMAL HIGH (ref 65–99)
Potassium: 5 mmol/L (ref 3.5–5.3)
Sodium: 140 mmol/L (ref 135–146)
Total Bilirubin: 1.2 mg/dL (ref 0.2–1.2)
Total Protein: 6.3 g/dL (ref 6.1–8.1)

## 2019-04-04 LAB — URIC ACID: Uric Acid, Serum: 5.7 mg/dL (ref 4.0–8.0)

## 2019-04-06 ENCOUNTER — Ambulatory Visit (INDEPENDENT_AMBULATORY_CARE_PROVIDER_SITE_OTHER): Payer: Medicare HMO

## 2019-04-06 ENCOUNTER — Other Ambulatory Visit: Payer: Self-pay

## 2019-04-06 VITALS — BP 118/70 | HR 88 | Ht 71.0 in | Wt 225.0 lb

## 2019-04-06 DIAGNOSIS — I480 Paroxysmal atrial fibrillation: Secondary | ICD-10-CM

## 2019-04-06 NOTE — Progress Notes (Signed)
1.) Reason for visit: EKG, flecainide increase, afib  2.) Name of MD requesting visit: Dr. Acie Fredrickson  3.) H&P: Afib  ROS related to problem: Patient has no symptoms  4.) Assessment and plan per MD: Spoke with Dr. Acie Fredrickson, the patient should be scheduled for a cardioversion next week. Spoke with the patient, he requested to wait and speak with Dr. Elmarie Shiley nurse and see if he can have the cardioversion later.

## 2019-04-09 ENCOUNTER — Telehealth: Payer: Self-pay | Admitting: Cardiovascular Disease

## 2019-04-09 DIAGNOSIS — I480 Paroxysmal atrial fibrillation: Secondary | ICD-10-CM

## 2019-04-09 NOTE — Telephone Encounter (Signed)
  You are scheduled for a TEE/Cardioversion/TEE Cardioversion on Tuesday at 2:30 pm with Dr. Radford Pax.  Please arrive at the [facility] (Main Entrance A) at [facility]: [address] [place], [place] [address] at 1:30 pm.   DIET: Nothing to eat or drink after midnight except a sip of water with medications (see medication instructions below)  Medication Instructions:  Continue your anticoagulant: Eliquis You will need to continue your anticoagulant after your procedure until you  are told by your  Provider that it is safe to stop   Labs:  Come to: lab at [facility] between the hours of 8:00 am and 4:30 pm. You do not have to be fasting.  You must have a responsible person to drive you home and stay in the waiting area during your procedure. Failure to do so could result in cancellation.  Bring your insurance cards.  *Special Note: Every effort is made to have your procedure done on time. Occasionally there are emergencies that occur at the hospital that may cause delays. Please be patient if a delay does occur.

## 2019-04-09 NOTE — Telephone Encounter (Signed)
New Message    Patient calling needs to reschedule a few appts please call back.

## 2019-04-12 ENCOUNTER — Other Ambulatory Visit: Payer: Medicare HMO | Admitting: *Deleted

## 2019-04-12 ENCOUNTER — Other Ambulatory Visit: Payer: Self-pay

## 2019-04-12 ENCOUNTER — Other Ambulatory Visit (INDEPENDENT_AMBULATORY_CARE_PROVIDER_SITE_OTHER): Payer: Medicare HMO | Admitting: *Deleted

## 2019-04-12 VITALS — BP 144/82 | HR 69 | Ht 71.0 in | Wt 226.0 lb

## 2019-04-12 DIAGNOSIS — I480 Paroxysmal atrial fibrillation: Secondary | ICD-10-CM

## 2019-04-12 DIAGNOSIS — I4892 Unspecified atrial flutter: Secondary | ICD-10-CM | POA: Diagnosis not present

## 2019-04-12 DIAGNOSIS — R42 Dizziness and giddiness: Secondary | ICD-10-CM

## 2019-04-12 LAB — CBC
Hematocrit: 44.9 % (ref 37.5–51.0)
Hemoglobin: 15.1 g/dL (ref 13.0–17.7)
MCH: 30.7 pg (ref 26.6–33.0)
MCHC: 33.6 g/dL (ref 31.5–35.7)
MCV: 91 fL (ref 79–97)
Platelets: 203 10*3/uL (ref 150–450)
RBC: 4.92 x10E6/uL (ref 4.14–5.80)
RDW: 13.5 % (ref 11.6–15.4)
WBC: 6 10*3/uL (ref 3.4–10.8)

## 2019-04-12 LAB — BASIC METABOLIC PANEL
BUN/Creatinine Ratio: 18 (ref 10–24)
BUN: 18 mg/dL (ref 8–27)
CO2: 23 mmol/L (ref 20–29)
Calcium: 9.3 mg/dL (ref 8.6–10.2)
Chloride: 103 mmol/L (ref 96–106)
Creatinine, Ser: 0.98 mg/dL (ref 0.76–1.27)
GFR calc Af Amer: 89 mL/min/{1.73_m2} (ref >59)
GFR calc non Af Amer: 77 mL/min/{1.73_m2} (ref >59)
Glucose: 188 mg/dL — ABNORMAL HIGH (ref 65–99)
Potassium: 4.6 mmol/L (ref 3.5–5.2)
Sodium: 139 mmol/L (ref 134–144)

## 2019-04-12 NOTE — Progress Notes (Unsigned)
Pt came in for pre procedure labs today. As pt is scheduled for DCCV 04-17-19 Pt reported feeling lightheaded (dizziness)Pt states he is not aware when is out of rhythm. See vital signs and EKG done as well Discussed with Dr Angelena Form and EKG shows a flutter and while waiting to discuss with Dr Angelena Form pt states lightheadedness has improved and felt  better. Per Dr Angelena Form pt free to leave ./cy

## 2019-04-13 ENCOUNTER — Other Ambulatory Visit (HOSPITAL_COMMUNITY)
Admission: RE | Admit: 2019-04-13 | Discharge: 2019-04-13 | Disposition: A | Discharge status: Home / Self Care | Payer: Medicare HMO | Source: Ambulatory Visit | Attending: Cardiology | Admitting: Cardiology

## 2019-04-13 ENCOUNTER — Ambulatory Visit: Payer: Medicare HMO

## 2019-04-13 ENCOUNTER — Other Ambulatory Visit: Payer: Self-pay | Admitting: Family Medicine

## 2019-04-13 DIAGNOSIS — Z1159 Encounter for screening for other viral diseases: Secondary | ICD-10-CM | POA: Insufficient documentation

## 2019-04-14 LAB — SARS CORONAVIRUS 2 (TAT 6-24 HRS): SARS Coronavirus 2: NEGATIVE

## 2019-04-16 ENCOUNTER — Other Ambulatory Visit: Payer: Self-pay | Admitting: Cardiovascular Disease

## 2019-04-16 DIAGNOSIS — N400 Enlarged prostate without lower urinary tract symptoms: Secondary | ICD-10-CM | POA: Diagnosis not present

## 2019-04-17 ENCOUNTER — Encounter (HOSPITAL_COMMUNITY)
Admission: RE | Disposition: A | Discharge status: Home / Self Care | Payer: Medicare HMO | Source: Home / Self Care | Attending: Cardiology

## 2019-04-17 ENCOUNTER — Ambulatory Visit (HOSPITAL_COMMUNITY)
Admission: RE | Admit: 2019-04-17 | Discharge: 2019-04-17 | Disposition: A | Discharge status: Home / Self Care | Payer: Medicare HMO | Attending: Cardiology | Admitting: Cardiology

## 2019-04-17 ENCOUNTER — Ambulatory Visit (HOSPITAL_COMMUNITY): Payer: Medicare HMO | Admitting: Certified Registered Nurse Anesthetist

## 2019-04-17 ENCOUNTER — Encounter (HOSPITAL_COMMUNITY): Payer: Self-pay | Admitting: *Deleted

## 2019-04-17 ENCOUNTER — Other Ambulatory Visit: Payer: Self-pay

## 2019-04-17 DIAGNOSIS — I4892 Unspecified atrial flutter: Secondary | ICD-10-CM | POA: Diagnosis not present

## 2019-04-17 DIAGNOSIS — N400 Enlarged prostate without lower urinary tract symptoms: Secondary | ICD-10-CM | POA: Diagnosis not present

## 2019-04-17 DIAGNOSIS — I10 Essential (primary) hypertension: Secondary | ICD-10-CM | POA: Insufficient documentation

## 2019-04-17 DIAGNOSIS — I4891 Unspecified atrial fibrillation: Secondary | ICD-10-CM | POA: Diagnosis not present

## 2019-04-17 DIAGNOSIS — Z87891 Personal history of nicotine dependence: Secondary | ICD-10-CM | POA: Insufficient documentation

## 2019-04-17 DIAGNOSIS — E119 Type 2 diabetes mellitus without complications: Secondary | ICD-10-CM | POA: Insufficient documentation

## 2019-04-17 DIAGNOSIS — Z79899 Other long term (current) drug therapy: Secondary | ICD-10-CM | POA: Diagnosis not present

## 2019-04-17 DIAGNOSIS — Z7901 Long term (current) use of anticoagulants: Secondary | ICD-10-CM | POA: Diagnosis not present

## 2019-04-17 DIAGNOSIS — E785 Hyperlipidemia, unspecified: Secondary | ICD-10-CM | POA: Diagnosis not present

## 2019-04-17 DIAGNOSIS — I480 Paroxysmal atrial fibrillation: Secondary | ICD-10-CM | POA: Insufficient documentation

## 2019-04-17 DIAGNOSIS — M109 Gout, unspecified: Secondary | ICD-10-CM | POA: Insufficient documentation

## 2019-04-17 DIAGNOSIS — M1990 Unspecified osteoarthritis, unspecified site: Secondary | ICD-10-CM | POA: Insufficient documentation

## 2019-04-17 DIAGNOSIS — F419 Anxiety disorder, unspecified: Secondary | ICD-10-CM | POA: Insufficient documentation

## 2019-04-17 HISTORY — PX: CARDIOVERSION: SHX1299

## 2019-04-17 SURGERY — CARDIOVERSION
Anesthesia: General

## 2019-04-17 MED ORDER — SODIUM CHLORIDE 0.9 % IV SOLN
INTRAVENOUS | Status: DC | PRN
  Administered 2019-04-17: 14:00:00 via INTRAVENOUS

## 2019-04-17 MED ORDER — SODIUM CHLORIDE 0.9 % IV SOLN
INTRAVENOUS | Status: AC | PRN
  Administered 2019-04-17: 500 mL via INTRAVENOUS

## 2019-04-17 MED ORDER — PROPOFOL 10 MG/ML IV BOLUS
INTRAVENOUS | Status: DC | PRN
  Administered 2019-04-17 (×2): 20 mg via INTRAVENOUS
  Administered 2019-04-17: 40 mg via INTRAVENOUS

## 2019-04-17 NOTE — Transfer of Care (Signed)
Immediate Anesthesia Transfer of Care Note  Patient: Everlene Farrier  Procedure(s) Performed: CARDIOVERSION (N/A )  Patient Location: Endoscopy Unit  Anesthesia Type:General  Level of Consciousness: patient cooperative and responds to stimulation  Airway & Oxygen Therapy: Patient Spontanous Breathing and Patient connected to nasal cannula oxygen  Post-op Assessment: Report given to RN and Post -op Vital signs reviewed and stable  Post vital signs: Reviewed and stable  Last Vitals:  Vitals Value Taken Time  BP    Temp    Pulse 59 04/17/19 1412  Resp 17 04/17/19 1412  SpO2 99 % 04/17/19 1412  Vitals shown include unvalidated device data.  Last Pain:  Vitals:   04/17/19 1322  TempSrc: Temporal  PainSc: 0-No pain         Complications: No apparent anesthesia complications

## 2019-04-17 NOTE — Interval H&P Note (Signed)
History and Physical Interval Note:  04/17/2019 1:51 PM  Jimmy Bradshaw  has presented today for surgery, with the diagnosis of atrial fibrillation.  The various methods of treatment have been discussed with the patient and family. After consideration of risks, benefits and other options for treatment, the patient has consented to  Procedure(s): CARDIOVERSION (N/A) as a surgical intervention.  The patient's history has been reviewed, patient examined, no change in status, stable for surgery.  I have reviewed the patient's chart and labs.  Questions were answered to the patient's satisfaction.     Fransico Him

## 2019-04-17 NOTE — Anesthesia Preprocedure Evaluation (Signed)
Anesthesia Evaluation  Patient identified by MRN, date of birth, ID band Patient awake    Reviewed: Allergy & Precautions, NPO status , Patient's Chart, lab work & pertinent test results, reviewed documented beta blocker date and time   History of Anesthesia Complications Negative for: history of anesthetic complications  Airway Mallampati: III  TM Distance: >3 FB Neck ROM: Full    Dental  (+) Dental Advisory Given, Partial Upper   Pulmonary former smoker,    breath sounds clear to auscultation       Cardiovascular hypertension, Pt. on medications and Pt. on home beta blockers + dysrhythmias Atrial Fibrillation  Rhythm:Irregular     Neuro/Psych PSYCHIATRIC DISORDERS Anxiety negative neurological ROS     GI/Hepatic negative GI ROS, Neg liver ROS,   Endo/Other  diabetes  Renal/GU negative Renal ROS     Musculoskeletal  (+) Arthritis ,   Abdominal   Peds  Hematology eliquis   Anesthesia Other Findings 1. The left ventricle has normal systolic function with an ejection fraction of 60-65%. The cavity size was normal. Left ventricular diastolic parameters were normal.  2. The right ventricle has normal systolic function. The cavity was normal. There is no increase in right ventricular wall thickness.  3. Left atrial size was mildly dilated.  4. The mitral valve is normal in structure. There is mild mitral annular calcification present.  5. The tricuspid valve is normal in structure.  6. The aortic valve is tricuspid Moderate thickening of the aortic valve Moderate calcification of the aortic valve.  7. The pulmonic valve was normal in structure.  Reproductive/Obstetrics                            Anesthesia Physical Anesthesia Plan  ASA: III  Anesthesia Plan: General   Post-op Pain Management:    Induction: Intravenous  PONV Risk Score and Plan: 2 and Treatment may vary due to age or  medical condition  Airway Management Planned: Mask  Additional Equipment: None  Intra-op Plan:   Post-operative Plan:   Informed Consent: I have reviewed the patients History and Physical, chart, labs and discussed the procedure including the risks, benefits and alternatives for the proposed anesthesia with the patient or authorized representative who has indicated his/her understanding and acceptance.     Dental advisory given  Plan Discussed with: CRNA and Surgeon  Anesthesia Plan Comments:         Anesthesia Quick Evaluation

## 2019-04-17 NOTE — CV Procedure (Signed)
   Electrical Cardioversion Procedure Note ELDRIGE PITKIN [id number] 08-04-1946  Procedure: Electrical Cardioversion Indications:  Atrial FLutter  Time Out: Verified patient identification, verified procedure,medications/allergies/relevent history reviewed, required imaging and test results available.  Performed  Procedure Details  The patient was NPO after midnight. Anesthesia was administered at the beside  by Dr.Moser with 80mg  of propofol.  Cardioversion was done with synchronized biphasic defibrillation with AP pads with 150 watts.  The patient converted to normal sinus rhythm. The patient tolerated the procedure well   IMPRESSION:  Successful cardioversion of atrial flutter    Traci Turner 04/17/2019, 1:51 PM

## 2019-04-18 NOTE — Anesthesia Postprocedure Evaluation (Signed)
Anesthesia Post Note  Patient: Everlene Farrier  Procedure(s) Performed: CARDIOVERSION (N/A )     Patient location during evaluation: Endoscopy Anesthesia Type: General Level of consciousness: awake and alert Pain management: pain level controlled Vital Signs Assessment: post-procedure vital signs reviewed and stable Respiratory status: spontaneous breathing, nonlabored ventilation, respiratory function stable and patient connected to nasal cannula oxygen Cardiovascular status: stable Postop Assessment: no apparent nausea or vomiting Anesthetic complications: no    Last Vitals:  Vitals:   04/17/19 1424 04/17/19 1431  BP: 93/61 100/68  Pulse: (!) 57 (!) 56  Resp: 13   Temp:  36.5 C  SpO2: 96% 98%    Last Pain:  Vitals:   04/17/19 1431  TempSrc: Temporal  PainSc: 0-No pain                 Lauraine Crespo

## 2019-05-02 ENCOUNTER — Other Ambulatory Visit: Payer: Self-pay

## 2019-05-02 ENCOUNTER — Encounter: Payer: Self-pay | Admitting: Cardiology

## 2019-05-02 ENCOUNTER — Ambulatory Visit (INDEPENDENT_AMBULATORY_CARE_PROVIDER_SITE_OTHER): Payer: Medicare HMO | Admitting: Cardiology

## 2019-05-02 VITALS — BP 120/62 | HR 67 | Ht 71.5 in | Wt 225.1 lb

## 2019-05-02 DIAGNOSIS — I480 Paroxysmal atrial fibrillation: Secondary | ICD-10-CM | POA: Diagnosis not present

## 2019-05-02 DIAGNOSIS — I10 Essential (primary) hypertension: Secondary | ICD-10-CM | POA: Diagnosis not present

## 2019-05-02 DIAGNOSIS — E785 Hyperlipidemia, unspecified: Secondary | ICD-10-CM | POA: Diagnosis not present

## 2019-05-02 NOTE — Patient Instructions (Signed)
Medication Instructions:  Your physician recommends that you continue on your current medications as directed. Please refer to the Current Medication list given to you today.  If you need a refill on your cardiac medications before your next appointment, please call your pharmacy.   Lab work: None   If you have labs (blood work) drawn today and your tests are completely normal, you will receive your results only by: Marland Kitchen MyChart Message (if you have MyChart) OR . A paper copy in the mail If you have any lab test that is abnormal or we need to change your treatment, we will call you to review the results.  Testing/Procedures: None   Follow-Up: You are scheduled to see Dr. Acie Fredrickson on 07/05/2019 @ 2:20 PM  Any Other Special Instructions Will Be Listed Below (If Applicable).

## 2019-05-02 NOTE — Progress Notes (Signed)
Cardiology Office Note:    Date:  05/02/2019   ID:  Jimmy Bradshaw, DOB 31-Jul-1946, MRN [id number]  PCP:  Alycia Rossetti, MD  Cardiologist:  Mertie Moores, MD  Referring MD: Alycia Rossetti, MD   Chief Complaint  Patient presents with  . Follow-up  . Atrial Flutter    Post cardioversion    History of Present Illness:    Jimmy Bradshaw is a 73 y.o. male with a past medical history significant for hypertension, hyperlipidemia, Type 2 DM, anxiety, BPH s/p TURP 2016 and PAF on Flecainide and Eliquis. echocardiogram was in 2016 which revealed normal left ventricular systolic function with an ejection fraction of 55 to 60%.  We were not able to assess his diastolic function because of the presence of atrial fibrillation.  GXT in 10/2016 showed mild widening of his QRS with peak exercise. We decreased his flecainide from 100 mg twice a day to 50 mg twice a day at that point. He had been maintaining sinus rhythm.   He was seen in the office on 04/03/2019 by Dr. Acie Fredrickson and found to be in atrial flutter, asymptomatic. His flecainide was increased to 100 mg BID and he was arranged to have DCCV which was done on 04/17/2019 and achieved successful conversion to sinus rhythm.   Pt had thought that drinking 2 glasses of iced tea may have triggered the episode.   [name] is here today for follow up after DCCV. Apparently he never increased his flecainide to 100 mg BID. He is still taking 1 tablet BID, 50 mg. He is maintaining sinus rhythm today on EKG.  He says that he never has symptoms with his A. Fib/flutter, so he would not know if he went into it.  He has had no chest discomfort, shortness of breath, dizziness, orthopnea or swelling.  He has been taking his Eliquis consistently without missing any doses.   Past Medical History:  Diagnosis Date  . Anxiety   . Arthritis    HANDS,  SHOULDERS  . Bladder neck obstruction   . BPH (benign prostatic hypertrophy)   . Dehydration 10/29/2017  .  Frequency of urination   . H/O: gout   . History of colon polyps   . Hyperlipidemia   . Hypokalemia 10/28/2017  . Nocturia   . Type 2 diabetes mellitus ([place])   . Urgency of urination   . Wears hearing aid    BILATERAL    Past Surgical History:  Procedure Laterality Date  . CARDIOVERSION N/A 10/26/2016   Procedure: CARDIOVERSION;  Surgeon: Larey Dresser, MD;  Location: [place];  Service: Cardiovascular;  Laterality: N/A;  . CARDIOVERSION N/A 04/17/2019   Procedure: CARDIOVERSION;  Surgeon: Sueanne Margarita, MD;  Location[facility]COPY;  Service: Cardiovascular;  Laterality: N/A;  . COLONOSCOPY  08-30-2011  . I&D KNEE WITH POLY EXCHANGE Left 10/08/2014   Procedure: IRRIGATION AND DEBRIDEMENT LEFT UNI KNEE ARTHOPLASTY  WITH POLY EXCHANGE;  Surgeon: Mauri Pole, MD;  Location: [facility];  Service: Orthopedics;  Laterality: Left;  . KNEE ARTHROSCOPY Left 11/ 2013  . PARTIAL KNEE ARTHROPLASTY Left 02/05/2013   Procedure: LEFT KNEE MEDIAL UNICOMPARTMENTAL KNEE;  Surgeon: Mauri Pole, MD;  Location: [facility];  Service: Orthopedics;  Laterality: Left;  . TRANSURETHRAL RESECTION OF PROSTATE N/A 09/30/2014   Procedure: TRANSURETHRAL RESECTION OF THE PROSTATE WITH GYRUS INSTRUMENTS;  Surgeon: Bernestine Amass, MD;  Location: [facility];  Service: Urology;  Laterality: N/A;  Current Medications: Current Meds  Medication Sig  . allopurinol (ZYLOPRIM) 300 MG tablet Take 1 tablet (300 mg total) by mouth daily.  Marland Kitchen buPROPion (WELLBUTRIN XL) 150 MG 24 hr tablet Take 1 tablet (150 mg total) by mouth daily.  Marland Kitchen ELIQUIS 5 MG TABS tablet TAKE 1 TABLET TWICE DAILY  . flecainide (TAMBOCOR) 50 MG tablet Take 50 mg by mouth 2 (two) times daily.  Marland Kitchen gabapentin (NEURONTIN) 100 MG capsule TAKE 1 TO 3 CAPSULES AT BEDTIME FOR NERVE PAIN AS NEEDED  . lisinopril (PRINIVIL,ZESTRIL) 2.5 MG tablet TAKE 1 TABLET EVERY DAY  . metoprolol succinate (TOPROL-XL) 25 MG 24 hr tablet Take 1 tablet (25 mg total)  by mouth daily.  . rosuvastatin (CRESTOR) 10 MG tablet TAKE 1 TABLET EVERY DAY  . [DISCONTINUED] flecainide (TAMBOCOR) 50 MG tablet Take 2 tablets (100 mg total) by mouth 2 (two) times daily.     Allergies:   Pravastatin and Penicillins   Social History   Socioeconomic History  . Marital status: Married    Spouse name: Not on file  . Number of children: Not on file  . Years of education: Not on file  . Highest education level: Not on file  Occupational History  . Not on file  Social Needs  . Financial resource strain: Not on file  . Food insecurity    Worry: Not on file    Inability: Not on file  . Transportation needs    Medical: Not on file    Non-medical: Not on file  Tobacco Use  . Smoking status: Former Smoker    Years: 4.00    Types: Cigarettes    Quit date: 12/08/1998    Years since quitting: 20.4  . Smokeless tobacco: Never Used  Substance and Sexual Activity  . Alcohol use: Yes    Alcohol/week: 1.0 standard drinks    Types: 1 Glasses of wine per week    Comment: RARE - a sip, occassional  . Drug use: No  . Sexual activity: Not Currently  Lifestyle  . Physical activity    Days per week: 0 days    Minutes per session: 0 min  . Stress: Not on file  Relationships  . Social Herbalist on phone: Not on file    Gets together: Not on file    Attends religious service: Not on file    Active member of club or organization: Not on file    Attends meetings of clubs or organizations: Not on file    Relationship status: Not on file  Other Topics Concern  . Not on file  Social History Narrative  . Not on file     Family History: The patient's family history includes Colon cancer in his father. He was adopted. ROS:   Please see the history of present illness.     All other systems reviewed and are negative.  EKGs/Labs/Other Studies Reviewed:    The following studies were reviewed today:  Echocardiogram 11/22/2018 IMPRESSIONS   1. The left  ventricle has normal systolic function with an ejection fraction of 60-65%. The cavity size was normal. Left ventricular diastolic parameters were normal.  2. The right ventricle has normal systolic function. The cavity was normal. There is no increase in right ventricular wall thickness.  3. Left atrial size was mildly dilated.  4. The mitral valve is normal in structure. There is mild mitral annular calcification present.  5. The tricuspid valve is normal in structure.  6.  The aortic valve is tricuspid Moderate thickening of the aortic valve Moderate calcification of the aortic valve.  7. The pulmonic valve was normal in structure.   EKG:  EKG is ordered today.  The ekg ordered today demonstrates NSR, 67 bpm, QRS 98, QTC 450  Recent Labs: 11/07/2018: Brain Natriuretic Peptide 347 04/03/2019: ALT 19 04/12/2019: BUN 18; Creatinine, Ser 0.98; Hemoglobin 15.1; Platelets 203; Potassium 4.6; Sodium 139   Recent Lipid Panel    Component Value Date/Time   CHOL 141 03/03/2018 1158   TRIG 127 03/03/2018 1158   HDL 41 03/03/2018 1158   CHOLHDL 3.4 03/03/2018 1158   VLDL 26 10/18/2016 0844   LDLCALC 78 03/03/2018 1158    Physical Exam:    VS:  BP 120/62   Pulse 67   Ht 5' 11.5" (1.816 m)   Wt 225 lb 1.9 oz (102.1 kg)   SpO2 98%   BMI 30.96 kg/m     Wt Readings from Last 3 Encounters:  05/02/19 225 lb 1.9 oz (102.1 kg)  04/17/19 220 lb (99.8 kg)  04/12/19 226 lb (102.5 kg)     Physical Exam  Constitutional: He is oriented to person, place, and time. He appears well-developed and well-nourished.  Neck: Normal range of motion. Neck supple. No JVD present.  Cardiovascular: Normal rate, regular rhythm, normal heart sounds and intact distal pulses. Exam reveals no gallop and no friction rub.  No murmur heard. Pulmonary/Chest: Effort normal and breath sounds normal. No respiratory distress. He has no wheezes. He has no rales.  Abdominal: Soft. Bowel sounds are normal.  Musculoskeletal:  Normal range of motion.        General: No edema.  Neurological: He is alert and oriented to person, place, and time.  Skin: Skin is warm and dry.  Psychiatric: He has a normal mood and affect. His behavior is normal. Judgment and thought content normal.  Vitals reviewed.  ASSESSMENT:    1. Paroxysmal atrial fibrillation (HCC)   2. Essential (primary) hypertension   3. Hyperlipidemia, unspecified hyperlipidemia type    PLAN:    In order of problems listed above:  Paroxysmal atrial fibrillation/flutter -Pt had been maintainins SR on flecainide 50 mg BID (had been decreased due to mildly widening QRS with GXT) found to be back in atrial flutter 04/03/2019. Flecainide was to be increased back to 100 mg BID, however patient says he did not understand that and he continued to take 50 mg twice daily.  Pt was successfully cardioverted on 04/17/2019.  -Will continue at 50 mg BID.  -CHADS 2VASC score of 3 ( Age, HTN, DM). On Eliquis for stroke risk reduction.   Essential hypertension -On lisiniopril 2.5 mg and Toprol 25 mg -Recent labs with normal renal function and potassium.  -BP well controlled.  Hyperlipidemia -On crestor 10 mg. Followed by PCP.  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 Is followed by Annia Belt, MD and at [facility] ( Dr. Frederica Kuster). Being followed. Follow up next July.    Medication Adjustments/Labs and Tests Ordered: Current medicines are reviewed at length with the patient today.  Concerns regarding medicines are outlined above. Labs and tests ordered and medication changes are outlined in the patient instructions below:  Patient Instructions  Medication Instructions:  Your physician recommends that you continue on your current medications as directed. Please refer to the Current Medication list given to you today.  If you need a refill on your cardiac medications before your next appointment,  please call your pharmacy.   Lab work: None   If you have  labs (blood work) drawn today and your tests are completely normal, you will receive your results only by: Marland Kitchen MyChart Message (if you have MyChart) OR . A paper copy in the mail If you have any lab test that is abnormal or we need to change your treatment, we will call you to review the results.  Testing/Procedures: None   Follow-Up: You are scheduled to see Dr. Acie Fredrickson on 07/05/2019 @ 2:20 PM  Any Other Special Instructions Will Be Listed Below (If Applicable).       Signed, Daune Perch, NP  05/02/2019 3:18 PM    [facility]

## 2019-05-06 ENCOUNTER — Encounter: Payer: Self-pay | Admitting: Family Medicine

## 2019-05-10 ENCOUNTER — Other Ambulatory Visit: Payer: Self-pay

## 2019-05-10 ENCOUNTER — Ambulatory Visit (INDEPENDENT_AMBULATORY_CARE_PROVIDER_SITE_OTHER): Payer: Medicare HMO | Admitting: *Deleted

## 2019-05-10 DIAGNOSIS — Z23 Encounter for immunization: Secondary | ICD-10-CM

## 2019-05-10 NOTE — Progress Notes (Signed)
Patient seen in office for Influenza and PNA Vaccination.   Tolerated IM administration well.   Immunization history updated.

## 2019-05-23 ENCOUNTER — Ambulatory Visit: Payer: Medicare HMO

## 2019-06-05 ENCOUNTER — Ambulatory Visit (INDEPENDENT_AMBULATORY_CARE_PROVIDER_SITE_OTHER): Payer: Medicare HMO | Admitting: Family Medicine

## 2019-06-05 ENCOUNTER — Encounter: Payer: Self-pay | Admitting: Family Medicine

## 2019-06-05 ENCOUNTER — Other Ambulatory Visit: Payer: Self-pay

## 2019-06-05 VITALS — BP 122/70 | HR 60 | Temp 98.3°F | Resp 16 | Ht 71.0 in | Wt 226.0 lb

## 2019-06-05 DIAGNOSIS — M25512 Pain in left shoulder: Secondary | ICD-10-CM | POA: Diagnosis not present

## 2019-06-05 DIAGNOSIS — G8929 Other chronic pain: Secondary | ICD-10-CM | POA: Diagnosis not present

## 2019-06-05 NOTE — Progress Notes (Signed)
Subjective:    Patient ID: Jimmy Bradshaw, male    DOB: [birth year], 73 y.o.   MRN: [id number]  HPI  07/29/16 Patient has a history of an injury to his left shoulder more than 10 years ago when he slipped and fell. Since that time, he has had intermittent pain in his left shoulder. Recently he developed constant pain with abduction greater than 80. He has severe pain with empty can testing and with Hawkins testing. There've been no new falls or injuries. He has pain and aching throbbing pain in his shoulder when he tries to sleep. He has difficult time lifting his arm above his head and has a difficult time coming his hair or putting on shirts. He has obvious symptoms of impingement syndrome along with tendinitis in his rotator cuff. 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  At that time, my plan was:  I suspect that the patient has a partial tear in his suprasternal not as with underlying impingement syndrome and bursitis in his left shoulder. We discussed this at length. Prior to performing MRI, the patient would like to try cortisone shot. Using sterile technique, I injected the left shoulder with 2 mL of lidocaine, 2 mL of Marcaine, and 2 mL of 40 mg per mL Kenalog. The patient tolerated the procedure well without complication  [id number] Is here today for follow-up. He states the shot lasted a good 8 weeks but has slowly started to wear off and is now causing him significant pain again. He is requesting a repeat cortisone injection.  At that time, my plan was: We discussed the patient's options. I recommended an MRI to rule out a tear that could be correctable surgically. He would like to repeat cortisone injection at least one more time before proceeding with an MRI. If the pain returns, he agrees to the MRI at that point. 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 I'm not sure that he will be a candidate for surgical correction given the length of time since injury but I feel an MRI is warranted  prior to committing the patient to cortisone injections every 3 months. He would like to try one additional time. Using sterile technique, I injected the left shoulder with 2 mL of lidocaine, 2 mL of Marcaine, and 2 mL of 40 mg per mL Kenalog. He tolerated the procedure without complication  [id number] Patient is here today requesting a repeat cortisone injection. It has been 3 months since his last. Patient states that the shots helped him immensely. He is able to sleep better without pain. However after 3 months the pain becomes moderate to severe and is unable to lift his arm greater than 90. Last time we discussed an MRI of the shoulder. He is adamant that he would not want surgery at this point. We have not performed a basic x-ray and I suggested that we at least do that to rule out underlying skeletal abnormalities that may account for his problem. 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 He would be willing to proceed with an x-ray of the shoulder. However he would like to perform a cortisone injection today. I explained to the patient that repeat cortisone increases his risk of AVN of the shoulder joint. He is willing to proceed with the injection.  At that time, my plan was: I would like the patient to get a basic  x-ray of his left shoulder. Meanwhile I injected the left shoulder with 2 mL of lidocaine, 2 mL of Marcaine, and 2 mL of 40 mg per mL Kenalog. Patient tolerated procedure well without complication.  Also recommended physical therapy exercises to be performed with the [employer] that the patient could do at home including internal and external rotation of the shoulder and abduction of the shoulder  03/08/17 Patient returns today requesting a repeat injection in his shoulder. It is only been 2 months. The pain returned after only one month. As long as his arm is in a completely  dependent position, he is in no pain. As soon as he tries to lift his left shoulder a raise his arm over his head, the pain intensifies.  At that time, my plan was: I will not charge the patient for an office visit.  I am unable to perform an injection because it has not been 3 months. Furthermore, I'll proceed with an MRI of the shoulder. I do not feel comfortable continuing to repeatedly inject the shoulder without determining the exact etiology of the problem. Perhaps the patient will be a candidate for surgical correction.  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 There is a tear of the anterior and far lateral supraspinatus measuring 0.6 cm from front to back. The tear has both full-thickness and undersurface component. There is no tendon retraction. The rotator cuff is otherwise intact  I recommended ortho consult.    He will not be able to see them until August 8. Furthermore he is deferring surgery until the fall. Therefore he is hopeful that we will be able to perform a cortisone injection today to help with his pain.  08/05/17 He has seen orthopedic surgery.  They have recommended physical therapy prior to any surgery.  Orthopedics also told the patient that he can receive a cortisone injection without causing further harm if necessary to control pain.  Patient is seen benefit from physical therapy however he continues to have severe pain in the shoulder particularly at night when he is trying to sleep and he would like a repeat cortisone injection today rather than having to pay a higher co-pay and see the orthopedist for the injection.  At that time, my plan was: Using sterile technique, I injected the left subacromial space with a mixture of 2 mL of lidocaine, 2 mL of Marcaine, and 2 mL of 40 mg per mL Kenalog. The patient tolerated the procedure well without  complication.  Patient will continue physical therapy and follow-up as planned with orthopedics.  11/29/17 Patient presents today requesting a repeat injection in his left shoulder.  He reports pain with abduction.  He reports pain with internal and external rotation.  Orthopedics has determined that the patient is stable to receive cortisone injections up to every 3 months as needed for pain in his shoulder until he cannot tolerate the pain any further and then he would be a candidate for shoulder surgery.  Therefore he is requesting a repeat cortisone injection.  At that time, my plan was: Using sterile technique, I injected the left subacromial space with a mixture of 2 mL of lidocaine, 2 mL of Marcaine, and 2 mL of 40 mg per mL Kenalog. The patient tolerated the procedure well without complication.    02/23/18 Patient is here today requesting a repeat injection of cortisone in his  left shoulder.  He continues to have pain with abduction greater than 90 to 100 degrees as well as internal and external rotation.  He continues to decline any surgical intervention.  Physical therapy provided minimal relief.  However he see substantial benefit from cortisone injections.  He usually gets 3 months of pain-free range of motion after a cortisone injection into the subacromial space.  He is willing to accept the risk and asked me to repeat the injection today.  07/20/18 Patient is again requesting a cortisone injection in his left shoulder in the subacromial space.  He states that he is done well over the last 4 months however the pain has returned.  He has pain with abduction greater than 90 degrees.  He also continues to have pain with internal and external rotation.  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  However, there is no evidence of a fracture or dislocation today on exam.  Patient is able to abduct his shoulder to  approximately 100 degrees.  He has full internal and external rotation although with pain.  There is crepitus with range of motion.  06/05/19 Patient is here today requesting a repeat cortisone injection.  Over the spring and summer he is done relatively well with [facility].  However over the last month, the pain is returned in his left shoulder.  He has pain with abduction.  He has pain with internal rotation.  Abduction is limited to 100 degrees.  It aches and throbs at night when he is trying to sleep on it.  He has a difficult time lifting objects over his head.  He has a difficult time even putting on his T-shirt.  He is requesting a repeat cortisone shot to help alleviate some of the pain. Past Medical History:  Diagnosis Date  . Anxiety   . Arthritis    HANDS,  SHOULDERS  . Bladder neck obstruction   . BPH (benign prostatic hypertrophy)   . Dehydration 10/29/2017  . Frequency of urination   . H/O: gout   . History of colon polyps   . Hyperlipidemia   . Hypokalemia 10/28/2017  . Nocturia   . Type 2 diabetes mellitus ([place])   . Urgency of urination   . Wears hearing aid    BILATERAL   Past Surgical History:  Procedure Laterality Date  . CARDIOVERSION N/A 10/26/2016   Procedure: CARDIOVERSION;  Surgeon: Larey Dresser, MD;  Location: [place];  Service: Cardiovascular;  Laterality: N/A;  . CARDIOVERSION N/A 04/17/2019   Procedure: CARDIOVERSION;  Surgeon: Sueanne Margarita, MD;  Location: [facility] ENDOSCOPY;  Service: Cardiovascular;  Laterality: N/A;  . COLONOSCOPY  08-30-2011  . I&D KNEE WITH POLY EXCHANGE Left 10/08/2014   Procedure: IRRIGATION AND DEBRIDEMENT LEFT UNI KNEE ARTHOPLASTY  WITH POLY EXCHANGE;  Surgeon: Mauri Pole, MD;  Location: [facility];  Service: Orthopedics;  Laterality: Left;  . KNEE ARTHROSCOPY Left 11/ 2013  . PARTIAL KNEE ARTHROPLASTY Left 02/05/2013   Procedure: LEFT KNEE MEDIAL UNICOMPARTMENTAL KNEE;  Surgeon: Mauri Pole, MD;  Location:  [facility];  Service: Orthopedics;  Laterality: Left;  . TRANSURETHRAL RESECTION OF PROSTATE N/A 09/30/2014   Procedure: TRANSURETHRAL RESECTION OF THE PROSTATE WITH GYRUS INSTRUMENTS;  Surgeon: Bernestine Amass, MD;  Location: [facility];  Service: Urology;  Laterality: N/A;   Current Outpatient Medications on File Prior to Visit  Medication  Sig Dispense Refill  . allopurinol (ZYLOPRIM) 300 MG tablet Take 1 tablet (300 mg total) by mouth daily. 90 tablet 3  . buPROPion (WELLBUTRIN XL) 150 MG 24 hr tablet Take 1 tablet (150 mg total) by mouth daily. 90 tablet 3  . ELIQUIS 5 MG TABS tablet TAKE 1 TABLET TWICE DAILY 180 tablet 1  . flecainide (TAMBOCOR) 50 MG tablet Take 50 mg by mouth 2 (two) times daily.    Marland Kitchen gabapentin (NEURONTIN) 100 MG capsule TAKE 1 TO 3 CAPSULES AT BEDTIME FOR NERVE PAIN AS NEEDED 90 capsule 3  . lisinopril (PRINIVIL,ZESTRIL) 2.5 MG tablet TAKE 1 TABLET EVERY DAY 90 tablet 1  . metoprolol succinate (TOPROL-XL) 25 MG 24 hr tablet Take 1 tablet (25 mg total) by mouth daily. 90 tablet 3  . rosuvastatin (CRESTOR) 10 MG tablet TAKE 1 TABLET EVERY DAY 90 tablet 3   No current facility-administered medications on file prior to visit.    Allergies  Allergen Reactions  . Pravastatin Other (See Comments)    Headache and legs hurt  . Penicillins Rash    Had penicillin shot w/o reaction 2018   Social History   Socioeconomic History  . Marital status: Married    Spouse name: Not on file  . Number of children: Not on file  . Years of education: Not on file  . Highest education level: Not on file  Occupational History  . Not on file  Social Needs  . Financial resource strain: Not on file  . Food insecurity    Worry: Not on file    Inability: Not on file  . Transportation needs    Medical: Not on file    Non-medical: Not on file  Tobacco Use  . Smoking status: Former Smoker    Years: 4.00    Types: Cigarettes    Quit date: 12/08/1998    Years since  quitting: 20.5  . Smokeless tobacco: Never Used  Substance and Sexual Activity  . Alcohol use: Yes    Alcohol/week: 1.0 standard drinks    Types: 1 Glasses of wine per week    Comment: RARE - a sip, occassional  . Drug use: No  . Sexual activity: Not Currently  Lifestyle  . Physical activity    Days per week: 0 days    Minutes per session: 0 min  . Stress: Not on file  Relationships  . Social Herbalist on phone: Not on file    Gets together: Not on file    Attends religious service: Not on file    Active member of club or organization: Not on file    Attends meetings of clubs or organizations: Not on file    Relationship status: Not on file  . Intimate partner violence    Fear of current or ex partner: Not on file    Emotionally abused: Not on file    Physically abused: Not on file    Forced sexual activity: Not on file  Other Topics Concern  . Not on file  Social History Narrative  . Not on file      Review of Systems  All other systems reviewed and are negative.      Objective:   Physical Exam  Constitutional: He appears well-developed and well-nourished.  Cardiovascular: Normal rate, regular rhythm and normal heart sounds.  Pulmonary/Chest: Effort normal and breath sounds normal. No respiratory distress. He has no wheezes. He has no rales.  Musculoskeletal:  Left shoulder: He exhibits decreased range of motion, tenderness, pain and decreased strength.  Vitals reviewed.          Assessment & Plan:  Chronic left shoulder pain   Using sterile technique, I injected the patient's left shoulder with a mixture of 2 cc lidocaine, 2 cc Marcaine, and 2 cc of 40 mg/mL Kenalog.  The patient tolerated the procedure well without complication.

## 2019-06-08 ENCOUNTER — Ambulatory Visit: Payer: Medicare HMO | Admitting: Family Medicine

## 2019-07-05 ENCOUNTER — Ambulatory Visit: Payer: Medicare HMO | Admitting: Cardiovascular Disease

## 2019-07-05 ENCOUNTER — Encounter: Payer: Self-pay | Admitting: Cardiovascular Disease

## 2019-07-05 ENCOUNTER — Other Ambulatory Visit: Payer: Self-pay

## 2019-07-05 VITALS — BP 130/70 | HR 61 | Ht 71.75 in | Wt 219.4 lb

## 2019-07-05 DIAGNOSIS — I10 Essential (primary) hypertension: Secondary | ICD-10-CM

## 2019-07-05 DIAGNOSIS — I480 Paroxysmal atrial fibrillation: Secondary | ICD-10-CM

## 2019-07-05 DIAGNOSIS — E785 Hyperlipidemia, unspecified: Secondary | ICD-10-CM

## 2019-07-05 DIAGNOSIS — E119 Type 2 diabetes mellitus without complications: Secondary | ICD-10-CM | POA: Diagnosis not present

## 2019-07-05 NOTE — Progress Notes (Signed)
Patient ID: Jimmy Bradshaw, male   DOB: May 21, 1946, 73 y.o.   MRN: [id number]    Date:  07/05/2019   ID:  Jimmy Bradshaw, DOB Oct 23, 1945, MRN [id number]  PCP:  Alycia Rossetti, MD  Primary Cardiologist:  Nahser  No chief complaint on file.  Problem list: 1. Paroxysmal atrial fibrillation  - CHADS2VASC score of 3  ( age, HTN, DM)  2. Hypertension 3. Hyperlipidemia 4. Diabetes mellitus 5. BPH - s/p TURP 6. S/p left TKA  - with supsequent infection of his prosthesis requiring re-do surgery .     Jimmy Bradshaw is a 73 y.o. male history of hypertension, hyperlipidemia, type 2 diabetes mellitus, anxiety, BPH. On 09/30/2014 he underwent TURP procedure and then a week later underwent irrigation and debridement of the left knee.  PICC line was placed and he was started on antibiotics for 4 weeks.  One of these surgeries patient supposedly went into atrial fibrillation with a rapid ventricular response. There are no EKGs or telemetry strips which confirmed this nor is there any documentation in the notes; either in the discharge summaries or with anesthesia.   The patient currently denies nausea, vomiting, fever, chest pain, shortness of breath, orthopnea, dizziness, PND, cough, congestion, abdominal pain, hematochezia, melena, lower extremity edema, claudication.   Feb. 19, 2016   He was getting his echo today and went in to rapid afib.  Still asymptomatic.  Was added on to my schedule.   November 21, 2014:  Jimmy Bradshaw is seen back today for follow up of his paroxysmal atrial fib.    We started him on Xarelto . Unfortunately, he had some bleeding in his urine .  He had just had recent prostate surgery ( [name]) .   He held his Xarelto for the next week.  He is doing better.   March 11, 2015: Jimmy Bradshaw has a hx of paroxysmal atrial fib. Has Eliquis but has not started it yet  His hematura has resolved.  Checks his pulse and has not had recurrent atrial fib to his knowledge   Doing well.  Is having  some leg pain.    Thinks it may be due to the pravachol  Still having knee problems .    Jan. 17, 2017:  Jimmy Bradshaw is doing well He has paroxysmal afib  - is back in Afib today - cannot tell that his HR is irregular.  No CP , no dyspnea.  He is not having any further urinary bleeding . His knee infection has resolved.   Jan. 30, 2018:  Jimmy Bradshaw is seen for visit  Dr. Buelah Manis increased the Toprol XL to 50 mg a day   January 10, 2017:  Jimmy Bradshaw is seen today for follow up of his atrial fib.  He had a cardioversion Feb. 6, 2018: GXT  Feb. 13, 2018 showed mild widening of his QRS with peak exercise. We decreased his flecainide from 100 mg twice a day to 50 mg twice a day at that point. In on Flecainide 50 mg BID , metoprolol 25 mg a day and Eliquis  Feels well Left knee replacement is working well   Dec. 19, 2018:  No CP or dysnea.   Maintaining  NSR  Is watching his diet.   Walks with dogs.    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  Having some vision issues.   Saw Dr. Sherrilyn Rist ( at [facility]) Being referred to [facility] for [place] eval   March 20, 2018:  He was seen  today for follow-up of his paroxysmal atrial fibrillation, HTN, HLD .  Marland Kitchen  He has maintained normal sinus rhythm.  He is on flecainide 50 mg twice a day. Has been having some issues with his vision.  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    Monitoring for now  No CP , no dyspnea,   Exercising  Running dogs in agility training .  Bearded collies.   Feb. 24, 2020:  Jimmy Bradshaw is seen today for follow-up of his hypertension, hyperlipidemia, paroxysmal atrial fibrillation.  He apparently has had some fluid retention recently and was started on Lasix. His last echocardiogram was in 2016 which revealed normal left ventricular systolic function with an ejection fraction of 55 to 60%.  We were not able to assess his diastolic function because of the presence of atrial fibrillation.  He started having swelling in his feet.  Right  > left  Started improving ( even before starting lasix )  Started lasix  Does not think he eats too much salt  Exercises regularly ,  - 3 days a week does agility training   No chest pain  Just DOE after running the dogs.   April 03, 2019: Jimmy Bradshaw is seen today for a follow-up visit.  He is back in atrial flutter today. He is completely asymptomatic.  He is been doing well.  He does his normal activities without any difficulty.  Blood pressure and heart rate are normal. Had 2 glasses of tea last night No Etoh recently  No missed dose of flecainide   July 05, 2019: Jimmy Bradshaw is seen today for follow-up of his atrial flutter. Patient has been successfully cardioverted again as of April 17, 2019. Feels well.     Wt Readings from Last 3 Encounters:  07/05/19 219 lb 6.4 oz (99.5 kg)  06/05/19 226 lb (102.5 kg)  05/02/19 225 lb 1.9 oz (102.1 kg)     Past Medical History:  Diagnosis Date  . Anxiety   . Arthritis    HANDS,  SHOULDERS  . Bladder neck obstruction   . BPH (benign prostatic hypertrophy)   . Dehydration 10/29/2017  . Frequency of urination   . H/O: gout   . History of colon polyps   . Hyperlipidemia   . Hypokalemia 10/28/2017  . Nocturia   . Type 2 diabetes mellitus ([place])   . Urgency of urination   . Wears hearing aid    BILATERAL    Current Outpatient Medications  Medication Sig Dispense Refill  . allopurinol (ZYLOPRIM) 300 MG tablet Take 1 tablet (300 mg total) by mouth daily. 90 tablet 3  . buPROPion (WELLBUTRIN XL) 150 MG 24 hr tablet Take 1 tablet (150 mg total) by mouth daily. 90 tablet 3  . ELIQUIS 5 MG TABS tablet TAKE 1 TABLET TWICE DAILY 180 tablet 1  . flecainide (TAMBOCOR) 50 MG tablet Take 50 mg by mouth 2 (two) times daily.    Marland Kitchen gabapentin (NEURONTIN) 100 MG capsule TAKE 1 TO 3 CAPSULES AT BEDTIME FOR NERVE PAIN AS NEEDED 90 capsule 3  . lisinopril (PRINIVIL,ZESTRIL) 2.5 MG tablet TAKE 1 TABLET EVERY DAY 90 tablet 1  . metoprolol succinate (TOPROL-XL) 25  MG 24 hr tablet Take 1 tablet (25 mg total) by mouth daily. 90 tablet 3  . rosuvastatin (CRESTOR) 10 MG tablet TAKE 1 TABLET EVERY DAY 90 tablet 3   No current facility-administered medications for this visit.     Allergies:  Allergies  Allergen Reactions  . Pravastatin Other (See Comments)    Headache and legs hurt  . Penicillins Rash    Had penicillin shot w/o reaction 2018    Social History:  The patient  reports that he quit smoking about 20 years ago. His smoking use included cigarettes. He quit after 4.00 years of use. He has never used smokeless tobacco. He reports current alcohol use of about 1.0 standard drinks of alcohol per week. He reports that he does not use drugs.   Family history:   Family History  Adopted: Yes  Problem Relation Age of Onset  . Colon cancer Father     ROS:  Please see the history of present illness.  All other systems reviewed and negative.    Physical Exam: Blood pressure 130/70, pulse 61, height 5' 11.75" (1.822 m), weight 219 lb 6.4 oz (99.5 kg), SpO2 98 %.  GEN:  Well nourished, well developed in no acute distress HEENT: Normal NECK: No JVD; No carotid bruits LYMPHATICS: No lymphadenopathy CARDIAC: RRR  RESPIRATORY:  Clear to auscultation without rales, wheezing or rhonchi  ABDOMEN: Soft, non-tender, non-distended MUSCULOSKELETAL:  No edema; No deformity  SKIN: Warm and dry NEUROLOGIC:  Alert and oriented x 3   EKG:      ASSESSMENT AND PLAN:   1.     2. Paroxysmal Atrial fibrillation:  CHADS 2VASC score of 3 ( Age, HTN, DM) .  He had atrial flutter in the past.  His heart rate is very regular.  He is completely asymptomatic.  He was in sinus rhythm the last time he was here in the office.  I will see him again in 6 months.  We will get an EKG at his next office visit.   3. Essential hypertension:    Blood pressure seems to be well controlled.  Continue current medications.  4. Hyperlipidemia:   Check lipids today.  Continue  current medications.  Will check lipids, liver enzymes, basic metabolic profile in 6 months.    5.   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 Is followed by Annia Belt, MD and at [facility] ( Dr. Frederica Kuster)  .    Thayer Headings, [name], MD, [facility] 07/05/2019, 2:54 PM [address],  [place] Pager [phone]

## 2019-07-05 NOTE — Patient Instructions (Signed)
Medication Instructions:  Your physician recommends that you continue on your current medications as directed. Please refer to the Current Medication list given to you today.  *If you need a refill on your cardiac medications before your next appointment, please call your pharmacy*  Lab Work: TODAY - cholesterol If you have labs (blood work) drawn today and your tests are completely normal, you will receive your results only by: Marland Kitchen MyChart Message (if you have MyChart) OR . A paper copy in the mail If you have any lab test that is abnormal or we need to change your treatment, we will call you to review the results.  Your physician recommends that you return for lab work in: 6 months on the day of or a few days before your office visit with Dr. Acie Fredrickson.  You will need to FAST for this appointment - nothing to eat or drink after midnight the night before except water.   Testing/Procedures: None Ordered   Follow-Up: At [facility], you and your health needs are our priority.  As part of our continuing mission to provide you with exceptional heart care, we have created designated Provider Care Teams.  These Care Teams include your primary Cardiologist (physician) and Advanced Practice Providers (APPs -  Physician Assistants and Nurse Practitioners) who all work together to provide you with the care you need, when you need it.  Your next appointment:   6 months  The format for your next appointment:   In Person  Provider:   You may see Mertie Moores, MD or one of the following Advanced Practice Providers on your designated Care Team:    Richardson Dopp, PA-C  [place], [place]  Daune Perch, [place]

## 2019-07-06 LAB — LIPID PANEL
Chol/HDL Ratio: 3.4 ratio (ref 0.0–5.0)
Cholesterol, Total: 136 mg/dL (ref 100–199)
HDL: 40 mg/dL (ref >39)
LDL Chol Calc (NIH): 81 mg/dL (ref 0–99)
Triglycerides: 73 mg/dL (ref 0–149)
VLDL Cholesterol Cal: 15 mg/dL (ref 5–40)

## 2019-07-27 ENCOUNTER — Other Ambulatory Visit: Payer: Self-pay | Admitting: Family Medicine

## 2019-08-06 ENCOUNTER — Other Ambulatory Visit: Payer: Self-pay | Admitting: Cardiovascular Disease

## 2019-08-20 ENCOUNTER — Other Ambulatory Visit: Payer: Self-pay | Admitting: Family Medicine

## 2019-09-24 ENCOUNTER — Encounter: Payer: Self-pay | Admitting: Family Medicine

## 2019-09-24 ENCOUNTER — Ambulatory Visit (INDEPENDENT_AMBULATORY_CARE_PROVIDER_SITE_OTHER): Payer: Medicare HMO | Admitting: Family Medicine

## 2019-09-24 ENCOUNTER — Other Ambulatory Visit: Payer: Self-pay

## 2019-09-24 VITALS — BP 138/74 | HR 58 | Temp 96.9°F | Resp 16 | Ht 71.0 in | Wt 220.0 lb

## 2019-09-24 DIAGNOSIS — M109 Gout, unspecified: Secondary | ICD-10-CM

## 2019-09-24 MED ORDER — PREDNISONE 20 MG PO TABS: ORAL_TABLET | ORAL | 0 refills | Status: DC

## 2019-09-24 NOTE — Progress Notes (Signed)
Subjective:    Patient ID: Jimmy Bradshaw, male    DOB: [birth year], 74 y.o.   MRN: [id number]  HPI  02/01/19 Patient is being seen today for follow-up of gout flare.  He called on Sunday with severe pain in his right knee erythema and effusion.  He started prednisone and now the pain is completely gone.  However he states that he is getting 3 episodes of gout in the last 6 months.  He was taking allopurinol 100 mg a day however he stopped that on Monday when he got the gout flare.  In the past he took indomethacin at the first sign of a gout flare however due to his other medical comorbidities he is not able to take indomethacin any longer.  He is perplexed as what he can do to help prevent or control these flares in the future without having to take prednisone.  At that time, my plan was: Patient is to most recent gout exacerbations in his left ankle and right knee have seemingly resolved.  I spent 20 minutes today with the patient explaining the natural history of gout and how to focus on prevention.  I recommended increasing his allopurinol to 300 mg a day to try to drive his uric acid level less than 6.  While doing this, he will take colchicine 0.6 mg daily to prevent an exacerbation of gout while the uric acid level stabilizes.  After 2 months he is to return to recheck his uric acid level and once stable and less than 6 I will discontinue colchicine.  I also recommended that he could take colchicine 1.2 mg at the first sign of a gout attack similar to the way he took indomethacin in the past to help prevent the gout attack from worsening.  We also discussed dietary changes to help prevent gout  04/03/19 Patient is here today for follow-up.  He is doing well on allopurinol.  He denies any side effects from the medication.  He denies any rash.  He has not had a gout exacerbation since I last saw him.  He is still taking allopurinol 300 mg a day coupled with colchicine 0.6 mg daily.  His PCP recently  put him on Wellbutrin.  He seems to be doing well on that and would like a 90-day supply.  He denies any issues with depression today currently in the office.  At that time, my plan was: Continue allopurinol 300 mg.  Check uric acid level.  If uric acid level is less than 6 we will continue allopurinol at 300 mg and discontinue colchicine.  He can use colchicine then only as needed for breakthrough gout exacerbations which hopefully will be less frequent on the allopurinol.  Also refilled his Wellbutrin per his PCPs request to save the patient a trip back to the office. - COMPLETE METABOLIC PANEL WITH GFR - Uric acid  09/24/19 Patient reports a sudden onset of left ankle pain beginning Saturday.  He denies any falls or injuries.  The left ankle became swollen and extremely tender.  The patient has a history of gout.  He states that he cannot even bear weight on his ankle right now due to the pain.  However he denies any injury that would cause this.  The ankle is slightly erythematous with a mild effusion.  There is no significant warmth.  However he has significant pain bearing weight and also with range of motion.  He is currently taking 300 mg of allopurinol  for prevention.  His last uric acid level was 5.7 in July.  He tried taking Colcrys as directed this weekend but saw no benefit on Saturday or Sunday sparking him to present today requesting assistance.  He is interested in taking Uloric for prevention. Past Medical History:  Diagnosis Date  . Anxiety   . Arthritis    HANDS,  SHOULDERS  . Bladder neck obstruction   . BPH (benign prostatic hypertrophy)   . Dehydration 10/29/2017  . Frequency of urination   . H/O: gout   . History of colon polyps   . Hyperlipidemia   . Hypokalemia 10/28/2017  . Nocturia   . Type 2 diabetes mellitus ([place])   . Urgency of urination   . Wears hearing aid    BILATERAL   Past Surgical History:  Procedure Laterality Date  . CARDIOVERSION N/A 10/26/2016    Procedure: CARDIOVERSION;  Surgeon: Larey Dresser, MD;  Location: [facility];  Service: Cardiovascular;  Laterality: N/A;  . CARDIOVERSION N/A 04/17/2019   Procedure: CARDIOVERSION;  Surgeon: Sueanne Margarita, MD;  Location[facility] ENDOSCOPY;  Service: Cardiovascular;  Laterality: N/A;  . COLONOSCOPY  08-30-2011  . I & D KNEE WITH POLY EXCHANGE Left 10/08/2014   Procedure: IRRIGATION AND DEBRIDEMENT LEFT UNI KNEE ARTHOPLASTY  WITH POLY EXCHANGE;  Surgeon: Mauri Pole, MD;  Location: [facility] ORS;  Service: Orthopedics;  Laterality: Left;  . KNEE ARTHROSCOPY Left 11/ 2013  . PARTIAL KNEE ARTHROPLASTY Left 02/05/2013   Procedure: LEFT KNEE MEDIAL UNICOMPARTMENTAL KNEE;  Surgeon: Mauri Pole, MD;  Location: [facility] ORS;  Service: Orthopedics;  Laterality: Left;  . TRANSURETHRAL RESECTION OF PROSTATE N/A 09/30/2014   Procedure: TRANSURETHRAL RESECTION OF THE PROSTATE WITH GYRUS INSTRUMENTS;  Surgeon: Bernestine Amass, MD;  Location: [facility];  Service: Urology;  Laterality: N/A;   Current Outpatient Medications on File Prior to Visit  Medication Sig Dispense Refill  . allopurinol (ZYLOPRIM) 300 MG tablet Take 1 tablet (300 mg total) by mouth daily. 90 tablet 3  . buPROPion (WELLBUTRIN XL) 150 MG 24 hr tablet Take 1 tablet (150 mg total) by mouth daily. 90 tablet 3  . colchicine (COLCRYS) 0.6 MG tablet Take 0.6 mg by mouth daily. 1 po with pain and repeat in 2 hours    . ELIQUIS 5 MG TABS tablet TAKE 1 TABLET TWICE DAILY 180 tablet 1  . flecainide (TAMBOCOR) 50 MG tablet TAKE 1 TABLET TWICE DAILY 180 tablet 0  . gabapentin (NEURONTIN) 100 MG capsule TAKE ONE TO THREE CAPSULES AT BEDTIME FOR NERVE PAIN AS NEEDED 90 capsule 3  . lisinopril (ZESTRIL) 2.5 MG tablet TAKE 1 TABLET EVERY DAY 90 tablet 1  . metoprolol succinate (TOPROL-XL) 25 MG 24 hr tablet Take 1 tablet (25 mg total) by mouth daily. 90 tablet 3  . rosuvastatin (CRESTOR) 10 MG tablet TAKE 1 TABLET EVERY DAY 90 tablet 3   No current  facility-administered medications on file prior to visit.   Allergies  Allergen Reactions  . Pravastatin Other (See Comments)    Headache and legs hurt  . Penicillins Rash    Had penicillin shot w/o reaction 2018   Social History   Socioeconomic History  . Marital status: Married    Spouse name: Not on file  . Number of children: Not on file  . Years of education: Not on file  . Highest education level: Not on file  Occupational History  . Not on file  Tobacco Use  .  Smoking status: Former Smoker    Years: 4.00    Types: Cigarettes    Quit date: 12/08/1998    Years since quitting: 20.8  . Smokeless tobacco: Never Used  Substance and Sexual Activity  . Alcohol use: Yes    Alcohol/week: 1.0 standard drinks    Types: 1 Glasses of wine per week    Comment: RARE - a sip, occassional  . Drug use: No  . Sexual activity: Not Currently  Other Topics Concern  . Not on file  Social History Narrative  . Not on file   Social Determinants of Health   Financial Resource Strain:   . Difficulty of Paying Living Expenses: Not on file  Food Insecurity:   . Worried About Charity fundraiser in the Last Year: Not on file  . Ran Out of Food in the Last Year: Not on file  Transportation Needs:   . Lack of Transportation (Medical): Not on file  . Lack of Transportation (Non-Medical): Not on file  Physical Activity:   . Days of Exercise per Week: Not on file  . Minutes of Exercise per Session: Not on file  Stress:   . Feeling of Stress : Not on file  Social Connections:   . Frequency of Communication with Friends and Family: Not on file  . Frequency of Social Gatherings with Friends and Family: Not on file  . Attends Religious Services: Not on file  . Active Member of Clubs or Organizations: Not on file  . Attends Archivist Meetings: Not on file  . Marital Status: Not on file  Intimate Partner Violence:   . Fear of Current or Ex-Partner: Not on file  . Emotionally  Abused: Not on file  . Physically Abused: Not on file  . Sexually Abused: Not on file     Review of Systems  All other systems reviewed and are negative.      Objective:   Physical Exam Vitals reviewed.  Constitutional:      Appearance: Normal appearance.  Cardiovascular:     Rate and Rhythm: Normal rate.     Heart sounds: Normal heart sounds.  Pulmonary:     Effort: Pulmonary effort is normal.     Breath sounds: Normal breath sounds. No wheezing, rhonchi or rales.  Musculoskeletal:     Right knee: No swelling or effusion. Normal range of motion. No medial joint line or lateral joint line tenderness.     Left ankle: Swelling present. Tenderness present. No lateral malleolus or medial malleolus tenderness. Decreased range of motion.  Neurological:     Mental Status: He is alert.           Assessment & Plan:   Exacerbation of gout - Plan: Uric Acid  Patient appears to have a gout exacerbation.  We will start the patient on a prednisone taper pack given the fact the colchicine has not provided him any relief.  Given his cardiovascular history I have recommended against Uloric.  We discussed the slightly higher incidence of myocardial infarction is on Uloric.  Instead I will check a uric acid level and if around 6, I will increase his allopurinol to 400 mg a day in an effort to try to drive his uric acid level less than 4.  Patient is comfortable with this plan.

## 2019-09-25 ENCOUNTER — Other Ambulatory Visit: Payer: Self-pay | Admitting: Family Medicine

## 2019-09-25 LAB — URIC ACID: Uric Acid, Serum: 6.5 mg/dL (ref 4.0–8.0)

## 2019-09-25 MED ORDER — ALLOPURINOL 100 MG PO TABS: 100.0000 mg | ORAL_TABLET | Freq: Every day | ORAL | 3 refills | Status: DC

## 2019-10-01 ENCOUNTER — Encounter: Payer: Self-pay | Admitting: Family Medicine

## 2019-10-01 ENCOUNTER — Other Ambulatory Visit: Payer: Self-pay | Admitting: Family Medicine

## 2019-10-01 MED ORDER — ALLOPURINOL 100 MG PO TABS: 100.0000 mg | ORAL_TABLET | Freq: Every day | ORAL | 3 refills | Status: DC

## 2019-10-08 MED ORDER — ALLOPURINOL 300 MG PO TABS: 300.0000 mg | ORAL_TABLET | Freq: Every day | ORAL | 3 refills | Status: DC

## 2019-10-08 MED ORDER — ALLOPURINOL 100 MG PO TABS: 100.0000 mg | ORAL_TABLET | Freq: Every day | ORAL | 3 refills | Status: DC

## 2019-10-08 NOTE — Addendum Note (Signed)
Addended by: Shary Decamp B on: 10/08/2019 08:39 AM   Modules accepted: Orders

## 2019-10-19 ENCOUNTER — Ambulatory Visit: Payer: Medicare HMO

## 2019-10-19 ENCOUNTER — Encounter: Payer: Self-pay | Admitting: Family Medicine

## 2019-10-24 ENCOUNTER — Ambulatory Visit (INDEPENDENT_AMBULATORY_CARE_PROVIDER_SITE_OTHER): Payer: Medicare HMO | Admitting: Family Medicine

## 2019-10-24 ENCOUNTER — Encounter: Payer: Self-pay | Admitting: Family Medicine

## 2019-10-24 ENCOUNTER — Ambulatory Visit: Payer: Medicare HMO | Admitting: Family Medicine

## 2019-10-24 ENCOUNTER — Other Ambulatory Visit: Payer: Self-pay

## 2019-10-24 VITALS — BP 132/64 | HR 78 | Temp 98.7°F | Resp 16 | Ht 71.0 in | Wt 223.0 lb

## 2019-10-24 DIAGNOSIS — I10 Essential (primary) hypertension: Secondary | ICD-10-CM

## 2019-10-24 DIAGNOSIS — E1140 Type 2 diabetes mellitus with diabetic neuropathy, unspecified: Secondary | ICD-10-CM

## 2019-10-24 DIAGNOSIS — M25531 Pain in right wrist: Secondary | ICD-10-CM

## 2019-10-24 DIAGNOSIS — E1149 Type 2 diabetes mellitus with other diabetic neurological complication: Secondary | ICD-10-CM | POA: Diagnosis not present

## 2019-10-24 DIAGNOSIS — E7800 Pure hypercholesterolemia, unspecified: Secondary | ICD-10-CM

## 2019-10-24 DIAGNOSIS — G8929 Other chronic pain: Secondary | ICD-10-CM | POA: Diagnosis not present

## 2019-10-24 DIAGNOSIS — M79671 Pain in right foot: Secondary | ICD-10-CM | POA: Diagnosis not present

## 2019-10-24 DIAGNOSIS — I480 Paroxysmal atrial fibrillation: Secondary | ICD-10-CM | POA: Diagnosis not present

## 2019-10-24 DIAGNOSIS — F419 Anxiety disorder, unspecified: Secondary | ICD-10-CM

## 2019-10-24 MED ORDER — BUPROPION HCL ER (XL) 300 MG PO TB24: 300.0000 mg | ORAL_TABLET | Freq: Every day | ORAL | 1 refills | Status: DC

## 2019-10-24 NOTE — Assessment & Plan Note (Signed)
Controlled on crestor reviewed recent labs from Nov

## 2019-10-24 NOTE — Assessment & Plan Note (Addendum)
DM diet controlled, recheck A1C He has been on a few courses of prednisone

## 2019-10-24 NOTE — Assessment & Plan Note (Signed)
Well controlled 

## 2019-10-24 NOTE — Progress Notes (Signed)
Subjective:    Patient ID: Jimmy Bradshaw, male    DOB: 1946-01-15, 74 y.o.   MRN: [id number]  Patient presents for Foot Pain (intermittent pain to inner heel) and Hand Pain (x1 month- wrist pain to R hand)  Pt here with foot pain medial aspect and heel pain, with swelling, tenderness   over the past year he has had multiple flares Often thought it was gout, but last few flares no improvement with colchicine  Now on allopurinol 400mg  once a day    Right wrist pain  For the past 2 weeks.  No particular injury.  He does not have any swelling.  The pain is at the radial styloid region as well as the base of his palm on the thumb side  DM-  6.4 in June  2020  Currently diet controlled.  Due for repeat lab  He had another cardioversion done for his A. fib in the fall he has been in sinus rhythm since then.  He has been maintained on Eliquis.  He is at increased stressors the past few months.  He feels his anxiety increasing and he feels on edge.  Feels like he gets frustrated very easily he would like to increase his Wellbutrin.  He is currently taking 150 mg extended release.  He has to pay over $300 for the Wellbutrin so he would like to be able to use this medication when adjusting his dose  Review Of Systems:  GEN- denies fatigue, fever, weight loss,weakness, recent illness HEENT- denies eye drainage, change in vision, nasal discharge, CVS- denies chest pain, palpitations RESP- denies SOB, cough, wheeze ABD- denies N/V, change in stools, abd pain GU- denies dysuria, hematuria, dribbling, incontinence MSK- denies joint pain, muscle aches, injury Neuro- denies headache, dizziness, syncope, seizure activity       Objective:    BP 132/64   Pulse 78   Temp 98.7 F (37.1 C) (Temporal)   Resp 16   Ht 5\' 11"  (1.803 m)   Wt 223 lb (101.2 kg)   SpO2 98%   BMI 31.10 kg/m  GEN- NAD, alert and oriented x3 HEENT- PERRL, EOMI, non injected sclera, pink conjunctiva, CVS- RRR, no  murmur RESP-CTAB ABD-NABS,soft,NT,ND Psych- normal affect and mood MSK- Right foot, mild swelling and prominence at achilles and post heel, TTP medial heel with mild swelling, no erythema, FROM Foot/ankle, achilles tendon in tact  EXT- No edema Pulses- Radial, DP- 2+        Assessment & Plan:      Problem List Items Addressed This Visit      Unprioritized   Anxiety    Increase to 300mg  XL wellbutrin Will see how he tolerate this as far as mood      Relevant Medications   buPROPion (WELLBUTRIN XL) 300 MG 24 hr tablet   Diabetic neuropathy (HCC)   HTN (hypertension)    Well controlled       Hyperlipidemia    Controlled on crestor reviewed recent labs from Nov      PAF (paroxysmal atrial fibrillation) (HCC)    S/P another ablation in the fall, in sinus rhythm on flecainide and eliquid      Type 2 diabetes mellitus with diabetic neuropathy, unspecified ([place])    DM diet controlled, recheck A1C He has been on a few courses of prednisone      Relevant Orders   CBC with Differential/Platelet   Comprehensive metabolic panel   Hemoglobin A1c    Other Visit  Diagnoses    Chronic pain of right heel    -  Primary   DD Heel spur, vs gouty tophi or OA, obtain xray   Relevant Medications   buPROPion (WELLBUTRIN XL) 300 MG 24 hr tablet   Other Relevant Orders   DG Foot Complete Right   Right wrist pain       treat tendinitis, use topical anti-inflammator due to his eliquis, good ROM       Note: This dictation was prepared with Dragon dictation along with smaller phrase technology. Any transcriptional errors that result from this process are unintentional.

## 2019-10-24 NOTE — Patient Instructions (Addendum)
F/U 3  months for Physical Get xray done of your right foot   [facility]    [place]   We will call with results Use topical voltaren gel on your wrist or biofreeze Increase your wellbutrinto  300mg  once a day

## 2019-10-24 NOTE — Assessment & Plan Note (Signed)
Increase to 300mg  XL wellbutrin Will see how he tolerate this as far as mood

## 2019-10-24 NOTE — Assessment & Plan Note (Signed)
S/P another ablation in the fall, in sinus rhythm on flecainide and eliquid

## 2019-10-25 ENCOUNTER — Ambulatory Visit: Payer: Medicare HMO | Attending: Internal Medicine

## 2019-10-25 ENCOUNTER — Ambulatory Visit
Admission: RE | Admit: 2019-10-25 | Discharge: 2019-10-25 | Disposition: A | Discharge status: Home / Self Care | Payer: Medicare HMO | Source: Ambulatory Visit | Attending: Family Medicine | Admitting: Family Medicine

## 2019-10-25 DIAGNOSIS — M79671 Pain in right foot: Secondary | ICD-10-CM

## 2019-10-25 DIAGNOSIS — G8929 Other chronic pain: Secondary | ICD-10-CM

## 2019-10-25 DIAGNOSIS — Z23 Encounter for immunization: Secondary | ICD-10-CM | POA: Insufficient documentation

## 2019-10-25 LAB — CBC WITH DIFFERENTIAL/PLATELET
Absolute Monocytes: 670 cells/uL (ref 200–950)
Basophils Absolute: 33 cells/uL (ref 0–200)
Basophils Relative: 0.5 %
Eosinophils Absolute: 221 cells/uL (ref 15–500)
Eosinophils Relative: 3.4 %
HCT: 43.9 % (ref 38.5–50.0)
Hemoglobin: 14.8 g/dL (ref 13.2–17.1)
Lymphs Abs: 1222 cells/uL (ref 850–3900)
MCH: 31.3 pg (ref 27.0–33.0)
MCHC: 33.7 g/dL (ref 32.0–36.0)
MCV: 92.8 fL (ref 80.0–100.0)
MPV: 11.2 fL (ref 7.5–12.5)
Monocytes Relative: 10.3 %
Neutro Abs: 4355 cells/uL (ref 1500–7800)
Neutrophils Relative %: 67 %
Platelets: 230 10*3/uL (ref 140–400)
RBC: 4.73 10*6/uL (ref 4.20–5.80)
RDW: 12.4 % (ref 11.0–15.0)
Total Lymphocyte: 18.8 %
WBC: 6.5 10*3/uL (ref 3.8–10.8)

## 2019-10-25 LAB — COMPREHENSIVE METABOLIC PANEL
AG Ratio: 2 (calc) (ref 1.0–2.5)
ALT: 16 U/L (ref 9–46)
AST: 17 U/L (ref 10–35)
Albumin: 4.4 g/dL (ref 3.6–5.1)
Alkaline phosphatase (APISO): 75 U/L (ref 35–144)
BUN/Creatinine Ratio: 17 (calc) (ref 6–22)
BUN: 21 mg/dL (ref 7–25)
CO2: 29 mmol/L (ref 20–32)
Calcium: 9.7 mg/dL (ref 8.6–10.3)
Chloride: 103 mmol/L (ref 98–110)
Creat: 1.21 mg/dL — ABNORMAL HIGH (ref 0.70–1.18)
Globulin: 2.2 g/dL (calc) (ref 1.9–3.7)
Glucose, Bld: 155 mg/dL — ABNORMAL HIGH (ref 65–99)
Potassium: 4.7 mmol/L (ref 3.5–5.3)
Sodium: 140 mmol/L (ref 135–146)
Total Bilirubin: 0.7 mg/dL (ref 0.2–1.2)
Total Protein: 6.6 g/dL (ref 6.1–8.1)

## 2019-10-25 LAB — HEMOGLOBIN A1C
Hgb A1c MFr Bld: 6.2 % of total Hgb — ABNORMAL HIGH (ref <5.7)
Mean Plasma Glucose: 131 (calc)
eAG (mmol/L): 7.3 (calc)

## 2019-10-25 NOTE — Progress Notes (Signed)
   Covid-19 Vaccination Clinic  Name:  Jimmy Bradshaw    MRN: [id number] DOB: [birth year]  10/25/2019  [name] was observed post Covid-19 immunization for 15 minutes without incidence. He was provided with Vaccine Information Sheet and instruction to access the V-Safe system.   [name] was instructed to call 911 with any severe reactions post vaccine: Marland Kitchen Difficulty breathing  . Swelling of your face and throat  . A fast heartbeat  . A bad rash all over your body  . Dizziness and weakness    Immunizations Administered    Name Date Dose VIS Date Route   Pfizer COVID-19 Vaccine 10/25/2019  2:11 PM 0.3 mL 08/31/2019 Intramuscular   Manufacturer: [place]   Lot: [id number]   [place]: [id number]

## 2019-11-12 ENCOUNTER — Other Ambulatory Visit: Payer: Self-pay | Admitting: Cardiovascular Disease

## 2019-11-12 ENCOUNTER — Other Ambulatory Visit: Payer: Self-pay | Admitting: Family Medicine

## 2019-11-12 NOTE — Telephone Encounter (Signed)
Eliquis 5mg  refill request received, pt is 74 yrs old, weight-101.2kg, Crea-1.21 on 10/24/2019, Diagnosis-Afib and last seen by Dr. Acie Fredrickson on 07/05/2019. Dose is appropriate based on dosing criteria. Will send in refill to requested pharmacy.

## 2019-11-15 ENCOUNTER — Ambulatory Visit: Payer: Medicare HMO | Attending: Internal Medicine

## 2019-11-15 DIAGNOSIS — Z23 Encounter for immunization: Secondary | ICD-10-CM | POA: Insufficient documentation

## 2019-11-15 NOTE — Progress Notes (Signed)
   Covid-19 Vaccination Clinic  Name:  Jimmy Bradshaw    MRN: [id number] DOB: 01/20/1946  11/15/2019  Mr. Jimmy Bradshaw was observed post Covid-19 immunization for 15 minutes without incidence. He was provided with Vaccine Information Sheet and instruction to access the V-Safe system.   Mr. Jimmy Bradshaw was instructed to call 911 with any severe reactions post vaccine: Marland Kitchen Difficulty breathing  . Swelling of your face and throat  . A fast heartbeat  . A bad rash all over your body  . Dizziness and weakness    Immunizations Administered    Name Date Dose VIS Date Route   Pfizer COVID-19 Vaccine 11/15/2019  2:15 PM 0.3 mL 08/31/2019 Intramuscular   Manufacturer: [place]   Lot: [id number]   [place]: [id number]

## 2019-11-20 ENCOUNTER — Ambulatory Visit: Payer: Medicare HMO

## 2019-12-24 ENCOUNTER — Encounter: Payer: Self-pay | Admitting: Family Medicine

## 2019-12-24 MED ORDER — BUPROPION HCL ER (XL) 300 MG PO TB24: 300.0000 mg | ORAL_TABLET | Freq: Every day | ORAL | 1 refills | Status: DC

## 2019-12-27 ENCOUNTER — Encounter: Payer: Self-pay | Admitting: Family Medicine

## 2019-12-27 DIAGNOSIS — E1149 Type 2 diabetes mellitus with other diabetic neurological complication: Secondary | ICD-10-CM

## 2020-01-02 ENCOUNTER — Other Ambulatory Visit: Payer: Self-pay | Admitting: Cardiovascular Disease

## 2020-01-02 DIAGNOSIS — G629 Polyneuropathy, unspecified: Secondary | ICD-10-CM | POA: Diagnosis not present

## 2020-01-03 ENCOUNTER — Encounter: Payer: Self-pay | Admitting: Cardiovascular Disease

## 2020-01-03 ENCOUNTER — Other Ambulatory Visit: Payer: Self-pay

## 2020-01-03 ENCOUNTER — Other Ambulatory Visit: Payer: Medicare HMO | Admitting: *Deleted

## 2020-01-03 ENCOUNTER — Ambulatory Visit: Payer: Medicare HMO | Admitting: Cardiovascular Disease

## 2020-01-03 VITALS — BP 134/72 | HR 52 | Ht 71.5 in | Wt 221.5 lb

## 2020-01-03 DIAGNOSIS — E785 Hyperlipidemia, unspecified: Secondary | ICD-10-CM | POA: Diagnosis not present

## 2020-01-03 DIAGNOSIS — E782 Mixed hyperlipidemia: Secondary | ICD-10-CM

## 2020-01-03 DIAGNOSIS — I10 Essential (primary) hypertension: Secondary | ICD-10-CM | POA: Diagnosis not present

## 2020-01-03 DIAGNOSIS — I480 Paroxysmal atrial fibrillation: Secondary | ICD-10-CM

## 2020-01-03 LAB — LIPID PANEL
Chol/HDL Ratio: 3.9 ratio (ref 0.0–5.0)
Cholesterol, Total: 126 mg/dL (ref 100–199)
HDL: 32 mg/dL — ABNORMAL LOW (ref >39)
LDL Chol Calc (NIH): 75 mg/dL (ref 0–99)
Triglycerides: 103 mg/dL (ref 0–149)
VLDL Cholesterol Cal: 19 mg/dL (ref 5–40)

## 2020-01-03 LAB — BASIC METABOLIC PANEL
BUN/Creatinine Ratio: 18 (ref 10–24)
BUN: 19 mg/dL (ref 8–27)
CO2: 22 mmol/L (ref 20–29)
Calcium: 9.4 mg/dL (ref 8.6–10.2)
Chloride: 103 mmol/L (ref 96–106)
Creatinine, Ser: 1.07 mg/dL (ref 0.76–1.27)
GFR calc Af Amer: 80 mL/min/{1.73_m2} (ref >59)
GFR calc non Af Amer: 69 mL/min/{1.73_m2} (ref >59)
Glucose: 173 mg/dL — ABNORMAL HIGH (ref 65–99)
Potassium: 4.3 mmol/L (ref 3.5–5.2)
Sodium: 140 mmol/L (ref 134–144)

## 2020-01-03 LAB — HEPATIC FUNCTION PANEL
ALT: 15 IU/L (ref 0–44)
AST: 20 IU/L (ref 0–40)
Albumin: 4.2 g/dL (ref 3.7–4.7)
Alkaline Phosphatase: 79 IU/L (ref 39–117)
Bilirubin Total: 0.6 mg/dL (ref 0.0–1.2)
Bilirubin, Direct: 0.15 mg/dL (ref 0.00–0.40)
Total Protein: 6.5 g/dL (ref 6.0–8.5)

## 2020-01-03 MED ORDER — METOPROLOL SUCCINATE ER 25 MG PO TB24: 25.0000 mg | ORAL_TABLET | Freq: Every day | ORAL | 3 refills | Status: DC

## 2020-01-03 NOTE — Patient Instructions (Signed)
Medication Instructions:  Your physician recommends that you continue on your current medications as directed. Please refer to the Current Medication list given to you today.  *If you need a refill on your cardiac medications before your next appointment, please call your pharmacy*  Lab Work: TODAY - cholesterol, liver panel, basic metabolic panel If you have labs (blood work) drawn today and your tests are completely normal, you will receive your results only by: . MyChart Message (if you have MyChart) OR . A paper copy in the mail If you have any lab test that is abnormal or we need to change your treatment, we will call you to review the results.   Testing/Procedures: None Ordered   Follow-Up: At [facility], you and your health needs are our priority.  As part of our continuing mission to provide you with exceptional heart care, we have created designated Provider Care Teams.  These Care Teams include your primary Cardiologist (physician) and Advanced Practice Providers (APPs -  Physician Assistants and Nurse Practitioners) who all work together to provide you with the care you need, when you need it.  Your next appointment:   1 year(s)  The format for your next appointment:   In Person  Provider:   You may see Philip Nahser, MD or one of the following Advanced Practice Providers on your designated Care Team:    Scott Weaver, PA-C  Vin Bhagat, PA-C  Janine Hammond, NP    

## 2020-01-03 NOTE — Progress Notes (Signed)
Patient ID: Jimmy Bradshaw, male   DOB: [birth year], 74 y.o.   MRN: [id number]    Date:  01/03/2020   ID:  Jimmy Bradshaw, DOB [birth year], MRN [id number]  PCP:  Alycia Rossetti, MD  Primary Cardiologist:  Noemy Hallmon  Chief Complaint  Patient presents with  . Atrial Fibrillation   Problem list: 1. Paroxysmal atrial fibrillation  - CHADS2VASC score of 3  ( age, HTN, DM)  2. Hypertension 3. Hyperlipidemia 4. Diabetes mellitus 5. BPH - s/p TURP 6. S/p left TKA  - with supsequent infection of his prosthesis requiring re-do surgery .     Jimmy Bradshaw is a 74 y.o. male history of hypertension, hyperlipidemia, type 2 diabetes mellitus, anxiety, BPH. On 09/30/2014 he underwent TURP procedure and then a week later underwent irrigation and debridement of the left knee.  PICC line was placed and he was started on antibiotics for 4 weeks.  One of these surgeries patient supposedly went into atrial fibrillation with a rapid ventricular response. There are no EKGs or telemetry strips which confirmed this nor is there any documentation in the notes; either in the discharge summaries or with anesthesia.   The patient currently denies nausea, vomiting, fever, chest pain, shortness of breath, orthopnea, dizziness, PND, cough, congestion, abdominal pain, hematochezia, melena, lower extremity edema, claudication.   Feb. 19, 2016   He was getting his echo today and went in to rapid afib.  Still asymptomatic.  Was added on to my schedule.   November 21, 2014:  Jimmy Bradshaw is seen back today for follow up of his paroxysmal atrial fib.    We started him on Xarelto . Unfortunately, he had some bleeding in his urine .  He had just had recent prostate surgery ( [name]) .   He held his Xarelto for the next week.  He is doing better.   March 11, 2015: Jimmy Bradshaw has a hx of paroxysmal atrial fib. Has Eliquis but has not started it yet  His hematura has resolved.  Checks his pulse and has not had recurrent atrial fib to his  knowledge   Doing well.  Is having some leg pain.    Thinks it may be due to the pravachol  Still having knee problems .    Jan. 17, 2017:  Jimmy Bradshaw is doing well He has paroxysmal afib  - is back in Afib today - cannot tell that his HR is irregular.  No CP , no dyspnea.  He is not having any further urinary bleeding . His knee infection has resolved.   Jan. 30, 2018:  Jimmy Bradshaw is seen for visit  Dr. Buelah Manis increased the Toprol XL to 50 mg a day   January 10, 2017:  Jimmy Bradshaw is seen today for follow up of his atrial fib.  He had a cardioversion Feb. 6, 2018: GXT  Feb. 13, 2018 showed mild widening of his QRS with peak exercise. We decreased his flecainide from 100 mg twice a day to 50 mg twice a day at that point. In on Flecainide 50 mg BID , metoprolol 25 mg a day and Eliquis  Feels well Left knee replacement is working well   Dec. 19, 2018:  No CP or dysnea.   Maintaining  NSR  Is watching his diet.   Walks with dogs.    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  Having some vision issues.   Saw Dr. Sherrilyn Rist ( at [facility]) Being referred to [facility] for [place] eval   March 20, 2018:  He was seen today for follow-up of his paroxysmal atrial fibrillation, HTN, HLD .  Marland Kitchen  He has maintained normal sinus rhythm.  He is on flecainide 50 mg twice a day. Has been having some issues with his vision.  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    Monitoring for now  No CP , no dyspnea,   Exercising  Running dogs in agility training .  Bearded collies.   Feb. 24, 2020:  Jimmy Bradshaw is seen today for follow-up of his hypertension, hyperlipidemia, paroxysmal atrial fibrillation.  He apparently has had some fluid retention recently and was started on Lasix. His last echocardiogram was in 2016 which revealed normal left ventricular systolic function with an ejection fraction of 55 to 60%.  We were not able to assess his diastolic function because of the presence of atrial fibrillation.  He started  having swelling in his feet.  Right > left  Started improving ( even before starting lasix )  Started lasix  Does not think he eats too much salt  Exercises regularly ,  - 3 days a week does agility training   No chest pain  Just DOE after running the dogs.   April 03, 2019: Jimmy Bradshaw is seen today for a follow-up visit.  He is back in atrial flutter today. He is completely asymptomatic.  He is been doing well.  He does his normal activities without any difficulty.  Blood pressure and heart rate are normal. Had 2 glasses of tea last night No Etoh recently  No missed dose of flecainide   July 05, 2019: Jimmy Bradshaw is seen today for follow-up of his atrial flutter. Patient has been successfully cardioverted again as of April 17, 2019. Feels well.     January 03, 2020 Jimmy Bradshaw is seen today for follow up of his atrial flutter and HTN Was last by Gae Bon last summer.  Has had both covid vaccines.   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  Has some neuropathy issues.     Wt Readings from Last 3 Encounters:  10/24/19 223 lb (101.2 kg)  09/24/19 220 lb (99.8 kg)  07/05/19 219 lb 6.4 oz (99.5 kg)     Past Medical History:  Diagnosis Date  . Anxiety   . Arthritis    HANDS,  SHOULDERS  . Bladder neck obstruction   . BPH (benign prostatic hypertrophy)   . Dehydration 10/29/2017  . Frequency of urination   . H/O: gout   . History of colon polyps   . Hyperlipidemia   . Hypokalemia 10/28/2017  . Nocturia   . Type 2 diabetes mellitus ([place])   . Urgency of urination   . Wears hearing aid    BILATERAL    Current Outpatient Medications  Medication Sig Dispense Refill  . allopurinol (ZYLOPRIM) 100 MG tablet Take 1 tablet (100 mg total) by mouth daily. 90 tablet 3  . allopurinol (ZYLOPRIM) 300 MG tablet Take 1 tablet (300 mg total) by mouth daily. 90 tablet 3  . buPROPion (WELLBUTRIN XL) 300 MG 24 hr tablet Take 1 tablet (300 mg  total) by mouth daily. 90 tablet 1  . colchicine (COLCRYS) 0.6 MG tablet Take 0.6 mg by mouth daily. 1 po with pain and repeat in 2 hours    . ELIQUIS 5 MG TABS tablet TAKE 1 TABLET TWICE DAILY 180 tablet 2  .  flecainide (TAMBOCOR) 50 MG tablet TAKE 1 TABLET TWICE DAILY 180 tablet 1  . gabapentin (NEURONTIN) 100 MG capsule TAKE ONE TO THREE CAPSULES AT BEDTIME FOR NERVE PAIN AS NEEDED 90 capsule 3  . lisinopril (ZESTRIL) 2.5 MG tablet TAKE 1 TABLET EVERY DAY 90 tablet 1  . metoprolol succinate (TOPROL-XL) 25 MG 24 hr tablet Take 1 tablet (25 mg total) by mouth daily. 90 tablet 3  . rosuvastatin (CRESTOR) 10 MG tablet TAKE 1 TABLET EVERY DAY 90 tablet 3   No current facility-administered medications for this visit.    Allergies:    Allergies  Allergen Reactions  . Pravastatin Other (See Comments)    Headache and legs hurt  . Penicillins Rash    Had penicillin shot w/o reaction 2018    Social History:  The patient  reports that he quit smoking about 21 years ago. His smoking use included cigarettes. He quit after 4.00 years of use. He has never used smokeless tobacco. He reports current alcohol use of about 1.0 standard drinks of alcohol per week. He reports that he does not use drugs.   Family history:   Family History  Adopted: Yes  Problem Relation Age of Onset  . Colon cancer Father     ROS:  Please see the history of present illness.  All other systems reviewed and negative.   Physical Exam: There were no vitals taken for this visit.  GEN:  Well nourished, well developed in no acute distress HEENT: Normal NECK: No JVD; No carotid bruits LYMPHATICS: No lymphadenopathy CARDIAC: RRR , no murmurs, rubs, gallops RESPIRATORY:  Clear to auscultation without rales, wheezing or rhonchi  ABDOMEN: Soft, non-tender, non-distended MUSCULOSKELETAL:  No edema; No deformity  SKIN: Warm and dry NEUROLOGIC:  Alert and oriented x 3   EKG:     Aug. 12, 2020:  NSR   ASSESSMENT AND  PLAN:   1.    Paroxysmal Atrial fibrillation:  CHADS 2VASC score of 3 ( Age, HTN, DM) .  cont Eliquis .    He is thinking about getting stem cell therapy to his knees.  He will likely need to hold his Eliquis for a day or so before the injection but I will leave that up to the orthopedic surgeons.  He is at low risk for any type of injection into his knee or feet and he is cleared to hold his Eliquis for 1 or 2 days if needed.   3. Essential hypertension:   BP is well controlled.   4. Hyperlipidemia:   Cont rosuvastatin ,  Check labs today   5.   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 Is followed by Annia Belt, MD and at [facility] ( Dr. Frederica Kuster)  .   Will see him in 1 year   Ramond Dial., MD, [facility] 01/03/2020, 5:42 AM [address]  [place] Pager [phone]

## 2020-01-23 ENCOUNTER — Telehealth: Payer: Self-pay | Admitting: *Deleted

## 2020-01-23 ENCOUNTER — Encounter: Payer: Self-pay | Admitting: Family Medicine

## 2020-01-23 ENCOUNTER — Other Ambulatory Visit: Payer: Self-pay

## 2020-01-23 ENCOUNTER — Ambulatory Visit (INDEPENDENT_AMBULATORY_CARE_PROVIDER_SITE_OTHER): Payer: Medicare HMO | Admitting: Family Medicine

## 2020-01-23 VITALS — BP 110/74 | HR 62 | Temp 98.6°F | Resp 14 | Ht 71.5 in | Wt 221.0 lb

## 2020-01-23 DIAGNOSIS — E782 Mixed hyperlipidemia: Secondary | ICD-10-CM | POA: Diagnosis not present

## 2020-01-23 DIAGNOSIS — S20212A Contusion of left front wall of thorax, initial encounter: Secondary | ICD-10-CM | POA: Diagnosis not present

## 2020-01-23 DIAGNOSIS — E1149 Type 2 diabetes mellitus with other diabetic neurological complication: Secondary | ICD-10-CM | POA: Diagnosis not present

## 2020-01-23 DIAGNOSIS — I10 Essential (primary) hypertension: Secondary | ICD-10-CM | POA: Diagnosis not present

## 2020-01-23 DIAGNOSIS — M1A09X0 Idiopathic chronic gout, multiple sites, without tophus (tophi): Secondary | ICD-10-CM | POA: Diagnosis not present

## 2020-01-23 DIAGNOSIS — E1140 Type 2 diabetes mellitus with diabetic neuropathy, unspecified: Secondary | ICD-10-CM

## 2020-01-23 DIAGNOSIS — E669 Obesity, unspecified: Secondary | ICD-10-CM

## 2020-01-23 DIAGNOSIS — I480 Paroxysmal atrial fibrillation: Secondary | ICD-10-CM

## 2020-01-23 DIAGNOSIS — Z0000 Encounter for general adult medical examination without abnormal findings: Secondary | ICD-10-CM

## 2020-01-23 DIAGNOSIS — Z0001 Encounter for general adult medical examination with abnormal findings: Secondary | ICD-10-CM | POA: Diagnosis not present

## 2020-01-23 MED ORDER — BOOSTRIX 5-2.5-18.5 LF-MCG/0.5 IM SUSP: 0.5000 mL | Freq: Once | INTRAMUSCULAR | 0 refills | Status: AC

## 2020-01-23 MED ORDER — SHINGRIX 50 MCG/0.5ML IM SUSR: 0.5000 mL | Freq: Once | INTRAMUSCULAR | 1 refills | Status: AC

## 2020-01-23 NOTE — Patient Instructions (Addendum)
F/U 6 months  Shingles and TDAP sent to pharmacy  We will call with A1C and uric acid  [facility] is another option

## 2020-01-23 NOTE — Telephone Encounter (Signed)
-----   Message from Alycia Rossetti, MD sent at 01/23/2020 11:15 AM EDT ----- Regarding: Send shingles and TD[facility]

## 2020-01-23 NOTE — Progress Notes (Signed)
Subjective:   Patient presents for Medicare Annual/Subsequent preventive examination.    Pt here for wellness visit    BPH- followed by urology, he is interested in another procedure , he has history of TURP,he still has nocturia , he had PSA done urology that was normal.   DM-  Last A1C 6.2% in Feb , Cr 1.21 , currently diet controlled, on low dose ACEI and statin drug    Hyperlipidemia - Lipids- reviewed, LDL 75    Gout- no recent flares, taking allopurinol 400mg , last uric acid was  6.5% in Jan    A fib- tolerating eliquid, no abnormal bleeding       Pain on left side, he had a fall 1 month ago, tripped over a leaf blower and hit the concreate , still has soreness and depending on positiion  Never noticed anything on the skin         Review Past Medical/Family/Social: Per EMR    Risk Factors  Current exercise habits: Stays physically active walks does agility training with his dog Dietary issues discussed: Yes  Cardiac risk factors: Obesity (BMI >= 30 kg/m2).  Diabetes mellitus, hypertension, HLD   Depression Screen  (Note: if answer to either of the following is "Yes", a more complete depression screening is indicated)  Over the past two weeks, have you felt down, depressed or hopeless? No Over the past two weeks, have you felt little interest or pleasure in doing things? No Have you lost interest or pleasure in daily life? No Do you often feel hopeless? No Do you cry easily over simple problems? No   Activities of Daily Living  In your present state of health, do you have any difficulty performing the following activities?:  Driving? No  Managing money? No  Feeding yourself? No  Getting from bed to chair? No  Climbing a flight of stairs? No  Preparing food and eating?: No  Bathing or showering? No  Getting dressed: No  Getting to the toilet? No  Using the toilet:No  Moving around from place to place: No  In the past year have you fallen or had a near  fall?:No  Are you sexually active? No  Do you have more than one partner? No   Hearing Difficulties: No  Do you often ask people to speak up or repeat themselves? No  Do you experience ringing or noises in your ears? No Do you have difficulty understanding soft or whispered voices? No  Do you feel that you have a problem with memory? No Do you often misplace items? No  Do you feel safe at home? Yes  Cognitive Testing  Alert? Yes Normal Appearance?Yes  Oriented to person? Yes Place? Yes  Time? Yes  Recall of three objects? Yes  Can perform simple calculations? Yes  Displays appropriate judgment?Yes  Can read the correct time from a watch face?Yes   List the Names of Other Physician/Practitioners you currently use:  Cardiology Urology   [place]   Screening Tests / Date Colonoscopy   UTD                   Shingles vaccine  Due  PNA- UTD  Influenza Vaccine  UTD COVID-19 vaccine UTD  Tetanus/tdap Due   ROS: GEN- denies fatigue, fever, weight loss,weakness, recent illness HEENT- denies eye drainage, change in vision, nasal discharge, CVS- denies chest pain, palpitations RESP- denies SOB, cough, wheeze ABD- denies N/V, change in stools, abd pain GU- denies dysuria,  hematuria, dribbling, incontinence MSK-+ joint pain, muscle aches, injury Neuro- denies headache, dizziness, syncope, seizure activity   Physical- Vitrals reviewed  GEN- NAD, alert and oriented x3 HEENT- PERRL, EOMI, non injected sclera, pink conjunctiva, MMM, oropharynx clear Neck- Supple, no thryomegaly CVS- RRR, no murmur RESP-CTAB ABD-NABS,soft,NT,ND Chest wall- NT over  left lower ant ribs, no step off, no bruising  Psych- normal affect and mood  EXT- No edema Pulses- Radial, DP- 2+    Positive fall screen, neg CAGE, Depression    Assessment:    Annual wellness medicare exam   Plan:    During the course of the visit the patient was educated and counseled about appropriate screening  and preventive services including:   Prevention we will send in shingles vaccine and tetanus to his pharmacy otherwise up-to-date.  Regarding his BPH his PSA is normal.  He does follow with urology.  He is not finding the procedure he was interested in and I will get a message with urologist about this. Diabetes mellitus has been diet controlled however on his labs with cardiology his fasting blood sugar was elevated we will recheck his A1c today.  Gout  has been controlled with allopurinol 400 mg.  Renal function has been normal as well as liver function.  We will recheck his uric acid level HTn- controlled  Afib-reviewed last cardiology note lipids at goal he is in sinus rhythm tolerating Eliquis    Has having living will- Son is HPOA   Status post fall with likely bruising to the ribs.  There are no red flags on exam advised that we can get an x-ray is already been about 4 weeks and has not had constant pain if no difficulty breathing so likely nothing else will be done he has not required any pain medication so we will hold off on x-ray at this time .  Diet review for nutrition referral? Yes ____ Not Indicated __x__  Patient Instructions (the written plan) was given to the patient.  Medicare Attestation  I have personally reviewed:  The patient's medical and social history  Their use of alcohol, tobacco or illicit drugs  Their current medications and supplements  The patient's functional ability including ADLs,fall risks, home safety risks, cognitive, and hearing and visual impairment  Diet and physical activities  Evidence for depression or mood disorders  The patient's weight, height, BMI, and visual acuity have been recorded in the chart. I have made referrals, counseling, and provided education to the patient based on review of the above and I have provided the patient with a written personalized care plan for preventive services.

## 2020-01-24 LAB — HEMOGLOBIN A1C
Hgb A1c MFr Bld: 5.9 % of total Hgb — ABNORMAL HIGH (ref <5.7)
Mean Plasma Glucose: 123 (calc)
eAG (mmol/L): 6.8 (calc)

## 2020-01-24 LAB — URIC ACID: Uric Acid, Serum: 4.2 mg/dL (ref 4.0–8.0)

## 2020-01-31 ENCOUNTER — Telehealth: Payer: Self-pay | Admitting: Family Medicine

## 2020-01-31 NOTE — Chronic Care Management (AMB) (Signed)
  Chronic Care Management   Outreach Note  01/31/2020 Name: Jimmy Bradshaw MRN: [id number] DOB: 1946-04-22  Referred by: Alycia Rossetti, MD Reason for referral : Chronic Care Management   An unsuccessful telephone outreach was attempted today. The patient was referred to the pharmacist for assistance with care management and care coordination.   Follow Up Plan:   [place]

## 2020-02-01 ENCOUNTER — Other Ambulatory Visit: Payer: Self-pay | Admitting: Family Medicine

## 2020-02-01 ENCOUNTER — Encounter: Payer: Self-pay | Admitting: Family Medicine

## 2020-02-05 ENCOUNTER — Encounter: Payer: Self-pay | Admitting: Family Medicine

## 2020-02-26 DIAGNOSIS — E1149 Type 2 diabetes mellitus with other diabetic neurological complication: Secondary | ICD-10-CM | POA: Diagnosis not present

## 2020-02-26 DIAGNOSIS — G629 Polyneuropathy, unspecified: Secondary | ICD-10-CM | POA: Diagnosis not present

## 2020-03-10 NOTE — Chronic Care Management (AMB) (Deleted)
Chronic Care Management Pharmacy  Name: Jimmy Bradshaw  MRN: [id number] DOB: 04/09/1946   Chief Complaint/ HPI  Jimmy Bradshaw,  74 y.o. , male presents for their Initial CCM visit with the clinical pharmacist In office.  PCP : Alycia Rossetti, MD  Their chronic conditions include: Afib, hypertension, Type II DM with neuropathy, BPH, hyperlipidemia, anxiety, gout.  Office Visits:***  Consult Visit:  02/26/2020 ([place], [facility]) - in for neuropathy, tapering up gabapentin to dose of 691m hs and then can switch to 3022mcapsules.  Medications: Outpatient Encounter Medications as of 03/12/2020  Medication Sig  . allopurinol (ZYLOPRIM) 100 MG tablet Take 1 tablet (100 mg total) by mouth daily.  . Marland Kitchenllopurinol (ZYLOPRIM) 300 MG tablet Take 1 tablet (300 mg total) by mouth daily.  . Marland KitchenuPROPion (WELLBUTRIN XL) 300 MG 24 hr tablet Take 1 tablet (300 mg total) by mouth daily.  . colchicine (COLCRYS) 0.6 MG tablet Take 0.6 mg by mouth daily. 1 po with pain and repeat in 2 hours  . ELIQUIS 5 MG TABS tablet TAKE 1 TABLET TWICE DAILY  . flecainide (TAMBOCOR) 50 MG tablet TAKE 1 TABLET TWICE DAILY  . gabapentin (NEURONTIN) 100 MG capsule TAKE ONE TO THREE CAPSULES AT BEDTIME FOR NERVE PAIN AS NEEDED  . lisinopril (ZESTRIL) 2.5 MG tablet TAKE 1 TABLET EVERY DAY  . metoprolol succinate (TOPROL-XL) 25 MG 24 hr tablet Take 1 tablet (25 mg total) by mouth daily.  . rosuvastatin (CRESTOR) 10 MG tablet TAKE 1 TABLET EVERY DAY   No facility-administered encounter medications on file as of 03/12/2020.     Current Diagnosis/Assessment:  Goals Addressed   None     AFIB   Patient is currently {CHL HP BP RATE/RHYTHM:[phone]} controlled. HR *** BPM  Patient has failed these meds in past: *** Patient is currently {CHL Controlled/Uncontrolled:2154463957} on the following medications: ***  We discussed:  {CHL HP Upstream Pharmacy discussion:[phone]}  Plan  Continue {CHL HP Upstream  Pharmacy Plans:7037821913}  ,  Diabetes   Recent Relevant Labs: Lab Results  Component Value Date/Time   HGBA1C 5.9 (H) 01/23/2020 11:15 AM   HGBA1C 6.2 (H) 10/24/2019 03:45 PM   HGBA1C 6.0 (H) 03/25/2015 09:27 AM   MICROALBUR 1.2 08/11/2018 12:48 PM   MICROALBUR 3.91 (H) 11/27/2013 08:44 AM     Checking BG: {CHL HP Blood Glucose Monitoring Frequency:[phone]}  Recent FBG Readings: Recent pre-meal BG readings: *** Recent 2hr PP BG readings:  *** Recent HS BG readings: *** Patient has failed these meds in past: *** Patient is currently {CHL Controlled/Uncontrolled:2154463957} on the following medications: ***  Last diabetic Foot exam:  Lab Results  Component Value Date/Time   HMDIABEYEEXA No Retinopathy 10/17/2018 12:00 AM    Last diabetic Eye exam: No results found for: HMDIABFOOTEX   We discussed: {CHL HP Upstream Pharmacy discussion:[phone]}  Plan  Continue {CHL HP Upstream Pharmacy Plans:7037821913},   Hyperlipidemia   LDL goal < ***  Lipid Panel     Component Value Date/Time   CHOL 126 01/03/2020 0847   TRIG 103 01/03/2020 0847   HDL 32 (L) 01/03/2020 0847   LDLCALC 75 01/03/2020 0847   LDLCALC 78 03/03/2018 1158    Hepatic Function Latest Ref Rng & Units 01/03/2020 10/24/2019 04/03/2019  Total Protein 6.0 - 8.5 g/dL 6.5 6.6 6.3  Albumin 3.7 - 4.7 g/dL 4.2 - -  AST 0 - 40 IU/L '20 17 22  ' ALT 0 - 44 IU/L '15 16 19  ' Alk Phosphatase  39 - 117 IU/L 79 - -  Total Bilirubin 0.0 - 1.2 mg/dL 0.6 0.7 1.2  Bilirubin, Direct 0.00 - 0.40 mg/dL 0.15 - -     The ASCVD Risk score (Lluveras., et al., 2013) failed to calculate for the following reasons:   The valid total cholesterol range is 130 to 320 mg/dL   Patient has failed these meds in past: *** Patient is currently {CHL Controlled/Uncontrolled:[phone]} on the following medications:  . ***  We discussed:  {CHL HP Upstream Pharmacy [phone]}  Plan  Continue {CHL HP Upstream Pharmacy  Plans:4841943836}  Hypertension   BP today is:  {CHL HP UPSTREAM Pharmacist BP [phone]}  Office blood pressures are  BP Readings from Last 3 Encounters:  01/23/20 110/74  01/03/20 134/72  10/24/19 132/64    Patient has failed these meds in the past: ***  Patient checks BP at home {CHL HP BP Monitoring Frequency:8077610493}  Patient home BP readings are ranging: ***  We discussed {CHL HP Upstream Pharmacy [phone]}  Plan  Continue {CHL HP Upstream Pharmacy Plans:4841943836}     and  Gout    Patient has failed these meds in past: *** Patient is currently {CHL Controlled/Uncontrolled:[phone]} on the following medications: ***  We discussed:  {CHL HP Upstream Pharmacy [phone]}  Plan  Continue {CHL HP Upstream Pharmacy Plans:4841943836}  BPH   Patient has failed these meds in past: *** Patient is currently {CHL Controlled/Uncontrolled:[phone]} on the following medications:  . ***  We discussed:  ***  Plan  Continue {CHL HP Upstream Pharmacy SAYTK:1601093235}  Anxiety   Patient has failed these meds in past: *** Patient is currently {CHL Controlled/Uncontrolled:[phone]} on the following medications:  . ***  We discussed:  ***  Plan  Continue {CHL HP Upstream Pharmacy TDDUK:0254270623}  Vaccines   Reviewed and discussed patient's vaccination history.    Immunization History  Administered Date(s) Administered  . Fluad Quad(high Dose 65+) 05/10/2019  . Influenza, High Dose Seasonal PF 06/15/2017, 07/06/2018  . Influenza,inj,Quad PF,6+ Mos 06/19/2014, 07/28/2015, 06/18/2016  . PFIZER SARS-COV-2 Vaccination 10/25/2019, 11/15/2019  . Pneumococcal Conjugate-13 09/28/2013, 05/10/2019  . Pneumococcal Polysaccharide-23 07/28/2015  . Tdap 09/25/2006  . Zoster 05/09/2008    Plan  Recommended patient receive *** vaccine in *** office/pharmacy.   Medication Management   . Miscellaneous medications: *** .  OTC's: *** . Patient currently uses *** pharmacy.  Phone #  [phone] *** . Patient reports using *** method to organize medications and promote adherence. . Patient reports/denies missed doses of medication ***   Beverly Milch, PharmD Clinical Pharmacist [facility] [phone]

## 2020-03-12 ENCOUNTER — Telehealth: Payer: Medicare HMO

## 2020-03-28 ENCOUNTER — Encounter: Payer: Self-pay | Admitting: Family Medicine

## 2020-04-09 DIAGNOSIS — N400 Enlarged prostate without lower urinary tract symptoms: Secondary | ICD-10-CM | POA: Diagnosis not present

## 2020-04-11 ENCOUNTER — Other Ambulatory Visit: Payer: Self-pay | Admitting: Family Medicine

## 2020-04-16 DIAGNOSIS — R350 Frequency of micturition: Secondary | ICD-10-CM | POA: Diagnosis not present

## 2020-04-16 DIAGNOSIS — N401 Enlarged prostate with lower urinary tract symptoms: Secondary | ICD-10-CM | POA: Diagnosis not present

## 2020-04-16 DIAGNOSIS — R3912 Poor urinary stream: Secondary | ICD-10-CM | POA: Diagnosis not present

## 2020-04-18 ENCOUNTER — Ambulatory Visit (INDEPENDENT_AMBULATORY_CARE_PROVIDER_SITE_OTHER): Payer: Medicare HMO | Admitting: Family Medicine

## 2020-04-18 ENCOUNTER — Other Ambulatory Visit: Payer: Self-pay

## 2020-04-18 ENCOUNTER — Encounter: Payer: Self-pay | Admitting: Family Medicine

## 2020-04-18 VITALS — BP 138/70 | HR 60 | Temp 98.4°F | Resp 18 | Ht 71.5 in | Wt 223.0 lb

## 2020-04-18 DIAGNOSIS — R6889 Other general symptoms and signs: Secondary | ICD-10-CM | POA: Diagnosis not present

## 2020-04-18 DIAGNOSIS — E1149 Type 2 diabetes mellitus with other diabetic neurological complication: Secondary | ICD-10-CM

## 2020-04-18 DIAGNOSIS — E1140 Type 2 diabetes mellitus with diabetic neuropathy, unspecified: Secondary | ICD-10-CM | POA: Diagnosis not present

## 2020-04-18 NOTE — Progress Notes (Signed)
   Subjective:    Patient ID: Jimmy Bradshaw, male    DOB: 1946/07/17, 74 y.o.   MRN: [id number]  Patient presents for Neuropathy (would like to check B 12 to see if def)  Pthere with concern for B12 def, he has diabetic neuropathy, he was titrated up to 600mg  of gabapentin by pain amanagment  At [facility]  but he never saw any improvement  now takes 300mg  most nights    Diabetes is well controlled with diet,last A1C 5.9% in May He was on metformin in the past   He wsa seen by urology recently for her ongoing BPH issues , he is having a revision of his previous TURP   Review Of Systems:  GEN- denies fatigue, fever, weight loss,weakness, recent illness HEENT- denies eye drainage, change in vision, nasal discharge, CVS- denies chest pain, palpitations RESP- denies SOB, cough, wheeze ABD- denies N/V, change in stools, abd pain MSK- denies joint pain, muscle aches, injury Neuro- denies headache, dizziness, syncope, seizure activity       Objective:    BP (!) 138/70   Pulse 60   Temp 98.4 F (36.9 C) (Temporal)   Resp 18   Ht 5' 11.5" (1.816 m)   Wt (!) 223 lb (101.2 kg)   SpO2 94%   BMI 30.67 kg/m  GEN- NAD, alert and oriented x3 CVS- RRR, no murmur RESP-CTAB EXT- No edema Pulses- Radial, DP- 2+        Assessment & Plan:      Problem List Items Addressed This Visit      Unprioritized   Diabetic neuropathy (HCC)    Keep gabapentin 300mg       Relevant Orders   Vitamin Y61   Basic metabolic panel   Type 2 diabetes mellitus with diabetic neuropathy, unspecified ([place]) - Primary    Diabetes controlled, but longstanding neuropathy Minimal improvement with meds Pain management ordered a stocking TENS unit per notes, but he was not aware of this, he will call them and clarify Check B12 level and treat accordingly to deficiency if present         Note: This dictation was prepared with Dragon dictation along with smaller phrase technology. Any transcriptional errors  that result from this process are unintentional.

## 2020-04-18 NOTE — Assessment & Plan Note (Signed)
Keep gabapentin 300mg 

## 2020-04-18 NOTE — Assessment & Plan Note (Signed)
Diabetes controlled, but longstanding neuropathy Minimal improvement with meds Pain management ordered a stocking TENS unit per notes, but he was not aware of this, he will call them and clarify Check B12 level and treat accordingly to deficiency if present

## 2020-04-18 NOTE — Patient Instructions (Addendum)
TENS unit with Verne Spurr was ordered by Dr. Shade Flood We will call with lab results  F/U November for medications

## 2020-04-19 LAB — BASIC METABOLIC PANEL
BUN/Creatinine Ratio: 18 (calc) (ref 6–22)
BUN: 22 mg/dL (ref 7–25)
CO2: 26 mmol/L (ref 20–32)
Calcium: 9.6 mg/dL (ref 8.6–10.3)
Chloride: 106 mmol/L (ref 98–110)
Creat: 1.19 mg/dL — ABNORMAL HIGH (ref 0.70–1.18)
Glucose, Bld: 98 mg/dL (ref 65–99)
Potassium: 4.8 mmol/L (ref 3.5–5.3)
Sodium: 141 mmol/L (ref 135–146)

## 2020-04-19 LAB — VITAMIN B12: Vitamin B-12: 809 pg/mL (ref 200–1100)

## 2020-04-21 ENCOUNTER — Telehealth: Payer: Self-pay | Admitting: Cardiovascular Disease

## 2020-04-21 ENCOUNTER — Other Ambulatory Visit: Payer: Self-pay | Admitting: Urology

## 2020-04-21 NOTE — Telephone Encounter (Signed)
   Primary Cardiologist: Mertie Moores, MD  Chart reviewed as part of pre-operative protocol coverage. Patient was contacted 04/21/2020 in reference to pre-operative risk assessment for pending surgery as outlined below.  Jimmy Bradshaw was last seen on 12/2019 by Dr. Acie Fredrickson.  Since that day, Jimmy Bradshaw has done well.  Therefore, based on ACC/AHA guidelines, the patient would be at acceptable risk for the planned procedure without further cardiovascular testing.   Case reviewed by pharmacy as he is on Eliquis for anticoagulation due to atrial fibrillation. He may hold Eliquis for 2 days prior to the planned procedure.   Of note, he was unaware the procedure was 05/08/20 - he thought it was 06/08/20 and was encouraged to call their office to discuss.   I will route this recommendation to the requesting party via Epic fax function and remove from pre-op pool. Please call with questions.  Loel Dubonnet, NP 04/21/2020, 3:34 PM

## 2020-04-21 NOTE — Telephone Encounter (Signed)
   [facility] Pre-operative Risk Assessment    HEARTCARE STAFF: - Please ensure there is not already an duplicate clearance open for this procedure. - Under Visit Info/Reason for Call, type in Other and utilize the format Clearance MM/DD/YY or Clearance TBD. Do not use dashes or single digits. - If request is for dental extraction, please clarify the # of teeth to be extracted.  Request for surgical clearance:  1. What type of surgery is being performed? TURP  2. When is this surgery scheduled? 05/08/20   3. What type of clearance is required (medical clearance vs. Pharmacy clearance to hold med vs. Both)? Both  4. Are there any medications that need to be held prior to surgery and how long? Eliquis 48 hrs prior to surgery  5. Practice name and name of physician performing surgery? Dr. Franchot Gallo  6. What is the office phone number? [phone]   7.   What is the office fax number? [phone]  8.   Anesthesia type (None, local, MAC, general) ? general   Jimmy Bradshaw 04/21/2020, 11:51 AM  _________________________________________________________________   (provider comments below)

## 2020-04-21 NOTE — Telephone Encounter (Signed)
Patient with diagnosis of afib on Eliquis for anticoagulation.    Procedure: TURP Date of procedure: 05/08/20  CHADS2-VASc score of3 (age, HTN, DM)  CrCl 63mL/min using adjusted body weight Platelet count 230K  Per office protocol, patient can hold Eliquis for 2 days prior to procedure.

## 2020-04-28 ENCOUNTER — Encounter (HOSPITAL_BASED_OUTPATIENT_CLINIC_OR_DEPARTMENT_OTHER): Payer: Self-pay | Admitting: Urology

## 2020-04-28 ENCOUNTER — Other Ambulatory Visit: Payer: Self-pay

## 2020-04-28 NOTE — Progress Notes (Addendum)
Spoke w/ via phone for pre-op interview---PT Lab needs dos----  I stat 8, ekg             Lab results------none COVID test ------ Arrive at -------05-05-2020 at 1330  NPO after MN NO Solid Food.  Clear liquids from MN until---430 am then npo Medications to take morning of surgery -----flecanide, bupropion, metoprolol succinate, allopurinol Diabetic medication -----none diet controlled dm Patient Special Instructions -----overnight stay instructions given Pre-Op special Istructions -----none Patient verbalized understanding of instructions that were given at this phone interview. Patient denies shortness of breath, chest pain, fever, cough at this phone interview.  Anesthesia Review: paf  PCP: dr Idamae Lusher Cardiologist : dr Lilian Coma 01-03-2020 epic, cardiac clerance walker np 04-21-2020 epic Chest x-ray : EKG :none Echo :11-22-2018 epic Stress test:none Cardiac Cath : none Activity level: can climn stairs and does housework without difficulty Sleep Study/ CPAP :none Fasting Blood Sugar :      / Checks Blood Sugar -- times a day:  Does not check cbg at home Blood Thinner/ Instructions /Last Dose: note to stop eliquis 2 days before surgery caitlin ealker np epic 04-21-2020 ASA / Instructions/ Last Dose : n/a   Patient states dr Alvan [facility] orthopedics wants him to take antibiotics for several days before any surgery, patient given selita at dr dahlstedt office number to call about antibiotics

## 2020-05-05 ENCOUNTER — Other Ambulatory Visit (HOSPITAL_COMMUNITY)
Admission: RE | Admit: 2020-05-05 | Discharge: 2020-05-05 | Disposition: A | Discharge status: Home / Self Care | Payer: Medicare HMO | Source: Ambulatory Visit | Attending: Urology | Admitting: Urology

## 2020-05-05 ENCOUNTER — Other Ambulatory Visit (HOSPITAL_COMMUNITY): Payer: Medicare HMO

## 2020-05-05 DIAGNOSIS — Z01812 Encounter for preprocedural laboratory examination: Secondary | ICD-10-CM | POA: Insufficient documentation

## 2020-05-05 DIAGNOSIS — U071 COVID-19: Secondary | ICD-10-CM | POA: Insufficient documentation

## 2020-05-05 LAB — SARS CORONAVIRUS 2 (TAT 6-24 HRS): SARS Coronavirus 2: POSITIVE — AB

## 2020-05-06 ENCOUNTER — Encounter: Payer: Self-pay | Admitting: Family Medicine

## 2020-05-06 NOTE — Progress Notes (Signed)
LVM with surgical scheduler regarding pt's positive covid results. Left callback number if needed.   Jacqlyn Larsen, RN

## 2020-05-06 NOTE — Progress Notes (Signed)
Left message with selita at [facility] urology pt covid test waspositive.

## 2020-05-07 ENCOUNTER — Other Ambulatory Visit: Payer: Self-pay

## 2020-05-07 ENCOUNTER — Other Ambulatory Visit: Payer: Self-pay | Admitting: Family Medicine

## 2020-05-07 MED ORDER — ALLOPURINOL 300 MG PO TABS: 300.0000 mg | ORAL_TABLET | Freq: Every day | ORAL | 1 refills | Status: DC

## 2020-05-07 MED ORDER — PREDNISONE 10 MG PO TABS: ORAL_TABLET | ORAL | 0 refills | Status: DC

## 2020-05-07 MED ORDER — ALLOPURINOL 100 MG PO TABS: 100.0000 mg | ORAL_TABLET | Freq: Every day | ORAL | 1 refills | Status: DC

## 2020-05-07 MED ORDER — COLCHICINE 0.6 MG PO TABS: 0.6000 mg | ORAL_TABLET | Freq: Every day | ORAL | 1 refills | Status: DC | PRN

## 2020-05-07 MED ORDER — COLCHICINE 0.6 MG PO TABS: 0.6000 mg | ORAL_TABLET | Freq: Every day | ORAL | 1 refills | Status: DC

## 2020-05-07 NOTE — Telephone Encounter (Signed)
Requested Prescriptions   Pending Prescriptions Disp Refills  . predniSONE (DELTASONE) 10 MG tablet 21 tablet 0    Sig: Take 40mg  on days 1-2. Take 30mg  on days 3-4. Take 20mg  on days 5-6. Take 10mg  on days 7-8. Take 5mg  on days 9-10, then stop.     Wants Rx for his gout flare up. Please advise.

## 2020-05-08 MED ORDER — MITIGARE 0.6 MG PO CAPS: ORAL_CAPSULE | ORAL | 3 refills | Status: DC

## 2020-05-14 ENCOUNTER — Other Ambulatory Visit: Payer: Self-pay

## 2020-05-19 ENCOUNTER — Other Ambulatory Visit: Payer: Self-pay | Admitting: Podiatry

## 2020-05-19 ENCOUNTER — Ambulatory Visit (INDEPENDENT_AMBULATORY_CARE_PROVIDER_SITE_OTHER): Payer: Medicare HMO

## 2020-05-19 ENCOUNTER — Encounter: Payer: Self-pay | Admitting: Podiatry

## 2020-05-19 ENCOUNTER — Other Ambulatory Visit: Payer: Self-pay

## 2020-05-19 ENCOUNTER — Ambulatory Visit: Payer: Medicare HMO | Admitting: Podiatry

## 2020-05-19 VITALS — Temp 97.3°F

## 2020-05-19 DIAGNOSIS — M79672 Pain in left foot: Secondary | ICD-10-CM | POA: Diagnosis not present

## 2020-05-19 DIAGNOSIS — M722 Plantar fascial fibromatosis: Secondary | ICD-10-CM

## 2020-05-19 DIAGNOSIS — M7662 Achilles tendinitis, left leg: Secondary | ICD-10-CM

## 2020-05-19 MED ORDER — METOPROLOL SUCCINATE ER 25 MG PO TB24: 25.0000 mg | ORAL_TABLET | Freq: Every day | ORAL | 2 refills | Status: DC

## 2020-05-19 NOTE — Progress Notes (Signed)
Subjective:   Patient ID: Jimmy Bradshaw, male   DOB: 74 y.o.   MRN: [id number]   HPI Patient presents stating is had a lot of pain in the back of his left heel and also some in the bottom of his heel.  States that the back is really been bothering him the most currently and that it is hard for him to wear shoe gear and do the types of activity he needs.  Patient does not smoke likes to be active   Review of Systems  All other systems reviewed and are negative.       Objective:  Physical Exam Vitals and nursing note reviewed.  Constitutional:      Appearance: He is well-developed.  Pulmonary:     Effort: Pulmonary effort is normal.  Musculoskeletal:        General: Normal range of motion.  Skin:    General: Skin is warm.  Neurological:     Mental Status: He is alert.     Neurovascular status intact muscle strength found to be adequate range of motion within normal limits.  Patient notes that in the back of his left feet heel lateral side there is some inflammation at the insertion of the Achilles tendon and it is at the insertional point that it hurts plantarly there is mild to moderate discomfort of the plantar fascia.  Patient has moderate depression of the arch has good digital perfusion well oriented x3     Assessment:  Acute Achilles tendinitis left with moderate plantar fasciitis left     Plan:  H&P reviewed conditions.  Today I went ahead and for the left I did do sterile prep and injected the posterior lateral heel after explaining chances for rupture with him and he excepted risk with 3 mg dexamethasone 5 mg Xylocaine and I then went ahead and I advised on reduced activity and that we may need to treat the plantar fascial also.  Reappoint 2 weeks to reevaluate  X-ray indicates large posterior spur formation left no indication stress fracture advanced arthritis

## 2020-05-22 ENCOUNTER — Other Ambulatory Visit: Payer: Self-pay

## 2020-05-22 MED ORDER — METOPROLOL SUCCINATE ER 25 MG PO TB24: 25.0000 mg | ORAL_TABLET | Freq: Every day | ORAL | 2 refills | Status: DC

## 2020-06-02 ENCOUNTER — Other Ambulatory Visit: Payer: Self-pay

## 2020-06-02 ENCOUNTER — Encounter: Payer: Self-pay | Admitting: Podiatry

## 2020-06-02 ENCOUNTER — Ambulatory Visit: Payer: Medicare HMO | Admitting: Podiatry

## 2020-06-02 DIAGNOSIS — M722 Plantar fascial fibromatosis: Secondary | ICD-10-CM | POA: Diagnosis not present

## 2020-06-02 DIAGNOSIS — M7662 Achilles tendinitis, left leg: Secondary | ICD-10-CM

## 2020-06-02 NOTE — Progress Notes (Signed)
Subjective:   Patient ID: Jimmy Bradshaw, male   DOB: 74 y.o.   MRN: [id number]   HPI patient presents stating I am doing better in the back of my heel with pain still present when I do a lot but I am also noticing more discomfort on the bottom of my heel   ROS      Objective:  Physical Exam  Neurovascular status intact negative Bevelyn Buckles' sign noted with posterior pain left Achilles improved but present still with moderate equinus and discomfort still noted in the plantar heel left at the insertion tendon calcaneus     Assessment:  Achilles tendinitis left improved but present with plantar fascial inflammation still noted left     Plan:  H&P reviewed both conditions.  For the bottom I did sterile prep and injected the fascia 3 mg Kenalog 5 mg Xylocaine and for the back I advised this patient on stretching exercises ice and anti-inflammatories.  Patient will be seen back to recheck as needed

## 2020-06-18 DIAGNOSIS — N401 Enlarged prostate with lower urinary tract symptoms: Secondary | ICD-10-CM | POA: Diagnosis not present

## 2020-06-18 DIAGNOSIS — R3912 Poor urinary stream: Secondary | ICD-10-CM | POA: Diagnosis not present

## 2020-06-23 ENCOUNTER — Encounter: Payer: Self-pay | Admitting: Family Medicine

## 2020-06-25 ENCOUNTER — Other Ambulatory Visit: Payer: Self-pay | Admitting: Family Medicine

## 2020-06-27 ENCOUNTER — Encounter (HOSPITAL_BASED_OUTPATIENT_CLINIC_OR_DEPARTMENT_OTHER): Payer: Self-pay | Admitting: Urology

## 2020-06-27 ENCOUNTER — Other Ambulatory Visit: Payer: Self-pay

## 2020-06-27 NOTE — Progress Notes (Signed)
Spoke w/ via phone for pre-op interview---pt Lab needs dos----    I stat 8, ekg           COVID test ------not needed covid positive 05-05-2020 Arrive at -------815 am 07-03-2020 NPO after MN NO Solid Food.  Clear liquids from MN until---715 am then npo Medications to take morning of surgery -----flecanide, bupropion, metoprolol succinate, allopurinol Diabetic medication -----n/a diet controlled Patient Special Instructions -----overnight stay instructions given Pre-Op special Istructions -----none Patient verbalized understanding of instructions that were given at this phone interview. Patient denies shortness of breath, chest pain, fever, cough at this phone interview.  Anesthesia Review: no  PCP:dr kwanta [place]ington Cardiologist :lov dr Cathie Olden 01-03-2020, cardiac clearance note caitlin walker no 04-21-2020 epic Chest x-ray :none EKG :none recent Echo :11-22-2018 epic Stress test:none Cardiac Cath : none Activity level: can climb stairs without problems and does housework without difficulty Sleep Study/ CPAP :none Fasting Blood Sugar :      / Checks Blood Sugar -- times a day:  Diet controlled does not check cbg Blood Thinner/ Instructions /Last Dose:note to stop eliquis 2 days before surgery caitlin walker np 04-21-2020 epic, pt aware ASA / Instructions/ Last Dose : n/a

## 2020-06-30 ENCOUNTER — Other Ambulatory Visit: Payer: Self-pay

## 2020-06-30 ENCOUNTER — Ambulatory Visit (INDEPENDENT_AMBULATORY_CARE_PROVIDER_SITE_OTHER): Payer: Medicare HMO | Admitting: Family Medicine

## 2020-06-30 VITALS — BP 130/80 | HR 90 | Temp 97.3°F | Ht 71.0 in | Wt 221.0 lb

## 2020-06-30 DIAGNOSIS — G8929 Other chronic pain: Secondary | ICD-10-CM | POA: Diagnosis not present

## 2020-06-30 DIAGNOSIS — M25512 Pain in left shoulder: Secondary | ICD-10-CM | POA: Diagnosis not present

## 2020-06-30 NOTE — Progress Notes (Signed)
Subjective:    Patient ID: Jimmy Bradshaw, male    DOB: 06-Dec-1945, 74 y.o.   MRN: [id number]  HPI  07/29/16 Patient has a history of an injury to his left shoulder more than 10 years ago when he slipped and fell. Since that time, he has had intermittent pain in his left shoulder. Recently he developed constant pain with abduction greater than 80. He has severe pain with empty can testing and with Hawkins testing. There've been no new falls or injuries. He has pain and aching throbbing pain in his shoulder when he tries to sleep. He has difficult time lifting his arm above his head and has a difficult time coming his hair or putting on shirts. He has obvious symptoms of impingement syndrome along with tendinitis in his rotator cuff. 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  At that time, my plan was:  I suspect that the patient has a partial tear in his suprasternal not as with underlying impingement syndrome and bursitis in his left shoulder. We discussed this at length. Prior to performing MRI, the patient would like to try cortisone shot. Using sterile technique, I injected the left shoulder with 2 mL of lidocaine, 2 mL of Marcaine, and 2 mL of 40 mg per mL Kenalog. The patient tolerated the procedure well without complication  8/65/78 Is here today for follow-up. He states the shot lasted a good 8 weeks but has slowly started to wear off and is now causing him significant pain again. He is requesting a repeat cortisone injection.  At that time, my plan was: We discussed the patient's options. I recommended an MRI to rule out a tear that could be correctable surgically. He would like to repeat cortisone injection at least one more time before proceeding with an MRI. If the pain returns, he agrees to the MRI at that point. 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 I'm not sure that he will be a candidate for surgical correction given the length of time since injury but I feel an MRI is warranted  prior to committing the patient to cortisone injections every 3 months. He would like to try one additional time. Using sterile technique, I injected the left shoulder with 2 mL of lidocaine, 2 mL of Marcaine, and 2 mL of 40 mg per mL Kenalog. He tolerated the procedure without complication  4/69/62 Patient is here today requesting a repeat cortisone injection. It has been 3 months since his last. Patient states that the shots helped him immensely. He is able to sleep better without pain. However after 3 months the pain becomes moderate to severe and is unable to lift his arm greater than 90. Last time we discussed an MRI of the shoulder. He is adamant that he would not want surgery at this point. We have not performed a basic x-ray and I suggested that we at least do that to rule out underlying skeletal abnormalities that may account for his problem. 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 He would be willing to proceed with an x-ray of the shoulder. However he would like to perform a cortisone injection today. I explained to the patient that repeat cortisone increases his risk of AVN of the shoulder joint. He is willing to proceed with the injection.  At that time, my plan was: I would like the patient to get a basic  x-ray of his left shoulder. Meanwhile I injected the left shoulder with 2 mL of lidocaine, 2 mL of Marcaine, and 2 mL of 40 mg per mL Kenalog. Patient tolerated procedure well without complication.  Also recommended physical therapy exercises to be performed with the [employer] that the patient could do at home including internal and external rotation of the shoulder and abduction of the shoulder  03/08/17 Patient returns today requesting a repeat injection in his shoulder. It is only been 2 months. The pain returned after only one month. As long as his arm is in a completely  dependent position, he is in no pain. As soon as he tries to lift his left shoulder a raise his arm over his head, the pain intensifies.  At that time, my plan was: I will not charge the patient for an office visit.  I am unable to perform an injection because it has not been 3 months. Furthermore, I'll proceed with an MRI of the shoulder. I do not feel comfortable continuing to repeatedly inject the shoulder without determining the exact etiology of the problem. Perhaps the patient will be a candidate for surgical correction.  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 There is a tear of the anterior and far lateral supraspinatus measuring 0.6 cm from front to back. The tear has both full-thickness and undersurface component. There is no tendon retraction. The rotator cuff is otherwise intact  I recommended ortho consult.    He will not be able to see them until August 8. Furthermore he is deferring surgery until the fall. Therefore he is hopeful that we will be able to perform a cortisone injection today to help with his pain.  08/05/17 He has seen orthopedic surgery.  They have recommended physical therapy prior to any surgery.  Orthopedics also told the patient that he can receive a cortisone injection without causing further harm if necessary to control pain.  Patient is seen benefit from physical therapy however he continues to have severe pain in the shoulder particularly at night when he is trying to sleep and he would like a repeat cortisone injection today rather than having to pay a higher co-pay and see the orthopedist for the injection.  At that time, my plan was: Using sterile technique, I injected the left subacromial space with a mixture of 2 mL of lidocaine, 2 mL of Marcaine, and 2 mL of 40 mg per mL Kenalog. The patient tolerated the procedure well without  complication.  Patient will continue physical therapy and follow-up as planned with orthopedics.  11/29/17 Patient presents today requesting a repeat injection in his left shoulder.  He reports pain with abduction.  He reports pain with internal and external rotation.  Orthopedics has determined that the patient is stable to receive cortisone injections up to every 3 months as needed for pain in his shoulder until he cannot tolerate the pain any further and then he would be a candidate for shoulder surgery.  Therefore he is requesting a repeat cortisone injection.  At that time, my plan was: Using sterile technique, I injected the left subacromial space with a mixture of 2 mL of lidocaine, 2 mL of Marcaine, and 2 mL of 40 mg per mL Kenalog. The patient tolerated the procedure well without complication.    02/23/18 Patient is here today requesting a repeat injection of cortisone in his  left shoulder.  He continues to have pain with abduction greater than 90 to 100 degrees as well as internal and external rotation.  He continues to decline any surgical intervention.  Physical therapy provided minimal relief.  However he see substantial benefit from cortisone injections.  He usually gets 3 months of pain-free range of motion after a cortisone injection into the subacromial space.  He is willing to accept the risk and asked me to repeat the injection today.  07/20/18 Patient is again requesting a cortisone injection in his left shoulder in the subacromial space.  He states that he is done well over the last 4 months however the pain has returned.  He has pain with abduction greater than 90 degrees.  He also continues to have pain with internal and external rotation.  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  However, there is no evidence of a fracture or dislocation today on exam.  Patient is able to abduct his shoulder to  approximately 100 degrees.  He has full internal and external rotation although with pain.  There is crepitus with range of motion.  06/05/19 Patient is here today requesting a repeat cortisone injection.  Over the spring and summer he is done relatively well with physical therapy through the [facility].  However over the last month, the pain is returned in his left shoulder.  He has pain with abduction.  He has pain with internal rotation.  Abduction is limited to 100 degrees.  It aches and throbs at night when he is trying to sleep on it.  He has a difficult time lifting objects over his head.  He has a difficult time even putting on his T-shirt.  He is requesting a repeat cortisone shot to help alleviate some of the pain.  06/30/20 Patient has managed to make it over a year without an injection in his left shoulder.  However he presents today reporting pain in the left shoulder with abduction and pain with range of motion.  He is again requesting a cortisone injection in the left shoulder.  He denies any new falls or injuries.  He denies any fevers or chills.  He does report pain that keeps him awake at night and pain with activities of daily living. Past Medical History:  Diagnosis Date  . Anxiety   . Arthritis    HANDS,  SHOULDERS  . Bladder neck obstruction   . BPH (benign prostatic hypertrophy)   . Frequency of urination   . H/O: gout   . History of colon polyps   . Hyperlipidemia   . Nocturia   . PAF (paroxysmal atrial fibrillation) ([place])   . Type 2 diabetes mellitus (HCC)    diet controlled  . Urgency of urination   . Wears hearing aid    BILATERAL   Past Surgical History:  Procedure Laterality Date  . CARDIOVERSION N/A 10/26/2016   Procedure: CARDIOVERSION;  Surgeon: Larey Dresser, MD;  Location: [place];  Service: Cardiovascular;  Laterality: N/A;  . CARDIOVERSION N/A 04/17/2019   Procedure: CARDIOVERSION;  Surgeon: Sueanne Margarita, MD;  Location: [facility] ENDOSCOPY;  Service:  Cardiovascular;  Laterality: N/A;  . COLONOSCOPY  08-30-2011, 2017  . I & D KNEE WITH POLY EXCHANGE Left 10/08/2014   Procedure: IRRIGATION AND DEBRIDEMENT LEFT UNI KNEE ARTHOPLASTY  WITH POLY EXCHANGE;  Surgeon: Mauri Pole, MD;  Location: [facility];  Service: Orthopedics;  Laterality: Left;  . KNEE ARTHROSCOPY Left 11/ 2013  . PARTIAL KNEE ARTHROPLASTY Left 02/05/2013   Procedure: LEFT KNEE MEDIAL UNICOMPARTMENTAL KNEE;  Surgeon: Mauri Pole, MD;  Location: [facility];  Service: Orthopedics;  Laterality: Left;  . PICC LINE INSERTION     and removed later  . TRANSURETHRAL RESECTION OF PROSTATE N/A 09/30/2014   Procedure: TRANSURETHRAL RESECTION OF THE PROSTATE WITH GYRUS INSTRUMENTS;  Surgeon: Bernestine Amass, MD;  Location: [facility];  Service: Urology;  Laterality: N/A;   Current Outpatient Medications on File Prior to Visit  Medication Sig Dispense Refill  . allopurinol (ZYLOPRIM) 100 MG tablet Take 1 tablet (100 mg total) by mouth daily. 90 tablet 1  . allopurinol (ZYLOPRIM) 300 MG tablet Take 1 tablet (300 mg total) by mouth daily. 90 tablet 1  . buPROPion (WELLBUTRIN XL) 300 MG 24 hr tablet TAKE 1 TABLET EVERY DAY 90 tablet 1  . ELIQUIS 5 MG TABS tablet TAKE 1 TABLET TWICE DAILY 180 tablet 2  . flecainide (TAMBOCOR) 50 MG tablet TAKE 1 TABLET TWICE DAILY 180 tablet 1  . gabapentin (NEURONTIN) 100 MG capsule TAKE ONE TO THREE CAPSULES AT BEDTIME FOR NERVE PAIN AS NEEDED 90 capsule 3  . lisinopril (ZESTRIL) 2.5 MG tablet TAKE 1 TABLET EVERY DAY 90 tablet 3  . metoprolol succinate (TOPROL-XL) 25 MG 24 hr tablet Take 1 tablet (25 mg total) by mouth daily. 90 tablet 2  . MITIGARE 0.6 MG CAPS Take 1 cap (0.6 mg total) by mouth daily as needed. May repeat in 2 hours 30 capsule 3  . rosuvastatin (CRESTOR) 10 MG tablet TAKE 1 TABLET EVERY DAY (Patient taking differently: at bedtime. ) 90 tablet 3   No current facility-administered medications on file prior to visit.   Allergies   Allergen Reactions  . Pravastatin Other (See Comments)    Headache and legs hurt  . Penicillins Rash    Had penicillin shot w/o reaction 2018   Social History   Socioeconomic History  . Marital status: Married    Spouse name: Not on file  . Number of children: Not on file  . Years of education: Not on file  . Highest education level: Not on file  Occupational History  . Not on file  Tobacco Use  . Smoking status: Former Smoker    Packs/day: 1.50    Years: 25.00    Pack years: 37.50    Types: Cigarettes    Quit date: 12/08/1998    Years since quitting: 21.5  . Smokeless tobacco: Never Used  Vaping Use  . Vaping Use: Never used  Substance and Sexual Activity  . Alcohol use: Yes    Alcohol/week: 1.0 standard drink    Types: 1 Glasses of wine per week    Comment: RARE - a sip, occassional  . Drug use: No  . Sexual activity: Not Currently  Other Topics Concern  . Not on file  Social History Narrative  . Not on file   Social Determinants of Health   Financial Resource Strain:   . Difficulty of Paying Living Expenses: Not on file  Food Insecurity:   . Worried About Charity fundraiser in the Last Year: Not on file  . Ran Out of Food in the Last Year: Not on file  Transportation Needs:   . Lack of Transportation (Medical): Not on file  . Lack of Transportation (Non-Medical): Not on file  Physical Activity:   . Days of Exercise  per Week: Not on file  . Minutes of Exercise per Session: Not on file  Stress:   . Feeling of Stress : Not on file  Social Connections:   . Frequency of Communication with Friends and Family: Not on file  . Frequency of Social Gatherings with Friends and Family: Not on file  . Attends Religious Services: Not on file  . Active Member of Clubs or Organizations: Not on file  . Attends Archivist Meetings: Not on file  . Marital Status: Not on file  Intimate Partner Violence:   . Fear of Current or Ex-Partner: Not on file  .  Emotionally Abused: Not on file  . Physically Abused: Not on file  . Sexually Abused: Not on file      Review of Systems  All other systems reviewed and are negative.      Objective:   Physical Exam Vitals reviewed.  Constitutional:      Appearance: He is well-developed.  Cardiovascular:     Rate and Rhythm: Normal rate and regular rhythm.     Heart sounds: Normal heart sounds.  Pulmonary:     Effort: Pulmonary effort is normal. No respiratory distress.     Breath sounds: Normal breath sounds. No wheezing or rales.  Musculoskeletal:     Left shoulder: Tenderness present. Decreased range of motion. Decreased strength.            Assessment & Plan:  Chronic left shoulder pain   Using sterile technique, I injected the patient's left shoulder with a mixture of 2 cc lidocaine, 2 cc Marcaine, and 2 cc of 40 mg/mL Kenalog.  The patient tolerated the procedure well without complication.

## 2020-07-02 NOTE — H&P (Signed)
H&P  Chief Complaint: Urinary difficulty  History of Present Illness: 74 yo male presents for repeat TURP-initial TURP performed 2016 but he has recurrent sx's. Cystoscopy revealed obstructive prostate.  Past Medical History:  Diagnosis Date  . Anxiety   . Arthritis    HANDS,  SHOULDERS  . Bladder neck obstruction   . BPH (benign prostatic hypertrophy)   . Frequency of urination   . H/O: gout   . History of colon polyps   . Hyperlipidemia   . Nocturia   . PAF (paroxysmal atrial fibrillation) ([place])   . Type 2 diabetes mellitus (HCC)    diet controlled  . Urgency of urination   . Wears hearing aid    BILATERAL    Past Surgical History:  Procedure Laterality Date  . CARDIOVERSION N/A 10/26/2016   Procedure: CARDIOVERSION;  Surgeon: Larey Dresser, MD;  Location: [place];  Service: Cardiovascular;  Laterality: N/A;  . CARDIOVERSION N/A 04/17/2019   Procedure: CARDIOVERSION;  Surgeon: Sueanne Margarita, MD;  Location: [facility] ENDOSCOPY;  Service: Cardiovascular;  Laterality: N/A;  . COLONOSCOPY  08-30-2011, 2017  . I & D KNEE WITH POLY EXCHANGE Left 10/08/2014   Procedure: IRRIGATION AND DEBRIDEMENT LEFT UNI KNEE ARTHOPLASTY  WITH POLY EXCHANGE;  Surgeon: Mauri Pole, MD;  Location: [facility];  Service: Orthopedics;  Laterality: Left;  . KNEE ARTHROSCOPY Left 11/ 2013  . PARTIAL KNEE ARTHROPLASTY Left 02/05/2013   Procedure: LEFT KNEE MEDIAL UNICOMPARTMENTAL KNEE;  Surgeon: Mauri Pole, MD;  Location: [facility];  Service: Orthopedics;  Laterality: Left;  . PICC LINE INSERTION     and removed later  . TRANSURETHRAL RESECTION OF PROSTATE N/A 09/30/2014   Procedure: TRANSURETHRAL RESECTION OF THE PROSTATE WITH GYRUS INSTRUMENTS;  Surgeon: Bernestine Amass, MD;  Location: [facility];  Service: Urology;  Laterality: N/A;    Home Medications:  Allergies as of 07/02/2020      Reactions   Pravastatin Other (See Comments)   Headache and legs hurt   Penicillins Rash   Had  penicillin shot w/o reaction 2018      Medication List    Notice   Cannot display discharge medications because the patient has not yet been admitted.     Allergies:  Allergies  Allergen Reactions  . Pravastatin Other (See Comments)    Headache and legs hurt  . Penicillins Rash    Had penicillin shot w/o reaction 2018    Family History  Adopted: Yes  Problem Relation Age of Onset  . Colon cancer Father     Social History:  reports that he quit smoking about 21 years ago. His smoking use included cigarettes. He has a 37.50 pack-year smoking history. He has never used smokeless tobacco. He reports current alcohol use of about 1.0 standard drink of alcohol per week. He reports that he does not use drugs.  ROS: A complete review of systems was performed.  All systems are negative except for pertinent findings as noted.  Physical Exam:  Vital signs in last 24 hours: Ht 6' (1.829 m)   Wt 99.8 kg   BMI 29.84 kg/m  Constitutional:  Alert and oriented, No acute distress Cardiovascular: Regular rate  Respiratory: Normal respiratory effort GI: Abdomen is soft, nontender, nondistended, no abdominal masses. No CVAT.  Genitourinary: Normal male phallus, testes are descended bilaterally and non-tender and without masses, scrotum is normal in appearance without lesions or masses, perineum is normal on inspection. Lymphatic: No lymphadenopathy Neurologic: Grossly  intact, no focal deficits Psychiatric: Normal mood and affect  Laboratory Data:  No results for input(s): WBC, HGB, HCT, PLT in the last 72 hours.  No results for input(s): NA, K, CL, GLUCOSE, BUN, CALCIUM, CREATININE in the last 72 hours.  Invalid input(s): CO3   No results found for this or any previous visit (from the past 24 hour(s)). No results found for this or any previous visit (from the past 240 hour(s)).  Renal Function: No results for input(s): CREATININE in the last 168 hours. CrCl cannot be calculated  (Patient's most recent lab result is older than the maximum 21 days allowed.).  Radiologic Imaging: No results found.  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

## 2020-07-03 ENCOUNTER — Encounter (HOSPITAL_BASED_OUTPATIENT_CLINIC_OR_DEPARTMENT_OTHER): Payer: Self-pay | Admitting: Urology

## 2020-07-03 ENCOUNTER — Encounter (HOSPITAL_BASED_OUTPATIENT_CLINIC_OR_DEPARTMENT_OTHER)
Admission: RE | Disposition: A | Discharge status: Home / Self Care | Payer: Self-pay | Source: Home / Self Care | Attending: Urology

## 2020-07-03 ENCOUNTER — Observation Stay (HOSPITAL_BASED_OUTPATIENT_CLINIC_OR_DEPARTMENT_OTHER)
Admission: RE | Admit: 2020-07-03 | Discharge: 2020-07-04 | Disposition: A | Discharge status: Home / Self Care | Payer: Medicare HMO | Attending: Urology | Admitting: Urology

## 2020-07-03 ENCOUNTER — Ambulatory Visit (HOSPITAL_BASED_OUTPATIENT_CLINIC_OR_DEPARTMENT_OTHER): Payer: Medicare HMO | Admitting: Anesthesiology

## 2020-07-03 ENCOUNTER — Other Ambulatory Visit: Payer: Self-pay

## 2020-07-03 DIAGNOSIS — N320 Bladder-neck obstruction: Secondary | ICD-10-CM | POA: Diagnosis not present

## 2020-07-03 DIAGNOSIS — E119 Type 2 diabetes mellitus without complications: Secondary | ICD-10-CM | POA: Insufficient documentation

## 2020-07-03 DIAGNOSIS — N401 Enlarged prostate with lower urinary tract symptoms: Secondary | ICD-10-CM | POA: Diagnosis not present

## 2020-07-03 DIAGNOSIS — I10 Essential (primary) hypertension: Secondary | ICD-10-CM | POA: Diagnosis not present

## 2020-07-03 DIAGNOSIS — N400 Enlarged prostate without lower urinary tract symptoms: Secondary | ICD-10-CM | POA: Diagnosis not present

## 2020-07-03 DIAGNOSIS — E785 Hyperlipidemia, unspecified: Secondary | ICD-10-CM | POA: Diagnosis not present

## 2020-07-03 DIAGNOSIS — I480 Paroxysmal atrial fibrillation: Secondary | ICD-10-CM | POA: Diagnosis not present

## 2020-07-03 DIAGNOSIS — Z87891 Personal history of nicotine dependence: Secondary | ICD-10-CM | POA: Diagnosis not present

## 2020-07-03 DIAGNOSIS — Z79899 Other long term (current) drug therapy: Secondary | ICD-10-CM | POA: Diagnosis not present

## 2020-07-03 DIAGNOSIS — N138 Other obstructive and reflux uropathy: Secondary | ICD-10-CM | POA: Diagnosis not present

## 2020-07-03 DIAGNOSIS — Z96652 Presence of left artificial knee joint: Secondary | ICD-10-CM | POA: Diagnosis not present

## 2020-07-03 HISTORY — DX: Paroxysmal atrial fibrillation: I48.0

## 2020-07-03 HISTORY — PX: TRANSURETHRAL RESECTION OF PROSTATE: SHX73

## 2020-07-03 LAB — POCT I-STAT, CHEM 8
BUN: 30 mg/dL — ABNORMAL HIGH (ref 8–23)
Calcium, Ion: 1.24 mmol/L (ref 1.15–1.40)
Chloride: 107 mmol/L (ref 98–111)
Creatinine, Ser: 1.1 mg/dL (ref 0.61–1.24)
Glucose, Bld: 136 mg/dL — ABNORMAL HIGH (ref 70–99)
HCT: 44 % (ref 39.0–52.0)
Hemoglobin: 15 g/dL (ref 13.0–17.0)
Potassium: 4.8 mmol/L (ref 3.5–5.1)
Sodium: 140 mmol/L (ref 135–145)
TCO2: 20 mmol/L — ABNORMAL LOW (ref 22–32)

## 2020-07-03 LAB — GLUCOSE, CAPILLARY: Glucose-Capillary: 132 mg/dL — ABNORMAL HIGH (ref 70–99)

## 2020-07-03 SURGERY — TURP (TRANSURETHRAL RESECTION OF PROSTATE)
Anesthesia: General | Site: Prostate

## 2020-07-03 MED ORDER — LISINOPRIL 2.5 MG PO TABS
2.5000 mg | ORAL_TABLET | Freq: Every day | ORAL | Status: DC
  Filled 2020-07-03: qty 1

## 2020-07-03 MED ORDER — PROPOFOL 10 MG/ML IV BOLUS
INTRAVENOUS | Status: AC
  Filled 2020-07-03: qty 20

## 2020-07-03 MED ORDER — ONDANSETRON HCL 4 MG/2ML IJ SOLN
INTRAMUSCULAR | Status: AC
  Filled 2020-07-03: qty 2

## 2020-07-03 MED ORDER — CIPROFLOXACIN IN D5W 400 MG/200ML IV SOLN
400.0000 mg | INTRAVENOUS | Status: AC
  Administered 2020-07-03: 400 mg via INTRAVENOUS

## 2020-07-03 MED ORDER — SODIUM CHLORIDE 0.9 % IR SOLN
Status: DC | PRN
  Administered 2020-07-03 (×3): 6000 mL

## 2020-07-03 MED ORDER — ACETAMINOPHEN 500 MG PO TABS: 1000.0000 mg | ORAL_TABLET | Freq: Once | ORAL | Status: DC

## 2020-07-03 MED ORDER — AMISULPRIDE (ANTIEMETIC) 5 MG/2ML IV SOLN: 10.0000 mg | Freq: Once | INTRAVENOUS | Status: DC | PRN

## 2020-07-03 MED ORDER — SENNA 8.6 MG PO TABS
ORAL_TABLET | ORAL | Status: AC
  Filled 2020-07-03: qty 1

## 2020-07-03 MED ORDER — BACITRACIN-NEOMYCIN-POLYMYXIN OINTMENT TUBE
TOPICAL_OINTMENT | CUTANEOUS | Status: AC
  Filled 2020-07-03: qty 14.17

## 2020-07-03 MED ORDER — SULFAMETHOXAZOLE-TRIMETHOPRIM 800-160 MG PO TABS
1.0000 | ORAL_TABLET | Freq: Two times a day (BID) | ORAL | Status: DC
  Administered 2020-07-03 (×2): 1 via ORAL
  Filled 2020-07-03 (×3): qty 1

## 2020-07-03 MED ORDER — ACETAMINOPHEN 325 MG PO TABS
ORAL_TABLET | ORAL | Status: AC
  Filled 2020-07-03: qty 2

## 2020-07-03 MED ORDER — PROPOFOL 10 MG/ML IV BOLUS
INTRAVENOUS | Status: DC | PRN
  Administered 2020-07-03: 200 mg via INTRAVENOUS

## 2020-07-03 MED ORDER — BELLADONNA ALKALOIDS-OPIUM 16.2-60 MG RE SUPP
1.0000 | Freq: Four times a day (QID) | RECTAL | Status: DC | PRN
  Administered 2020-07-03: 1 via RECTAL

## 2020-07-03 MED ORDER — CIPROFLOXACIN IN D5W 400 MG/200ML IV SOLN
INTRAVENOUS | Status: AC
  Filled 2020-07-03: qty 200

## 2020-07-03 MED ORDER — FENTANYL CITRATE (PF) 100 MCG/2ML IJ SOLN
INTRAMUSCULAR | Status: AC
  Filled 2020-07-03: qty 2

## 2020-07-03 MED ORDER — BUPROPION HCL ER (XL) 300 MG PO TB24
300.0000 mg | ORAL_TABLET | Freq: Every day | ORAL | Status: DC
  Filled 2020-07-03: qty 1

## 2020-07-03 MED ORDER — ONDANSETRON HCL 4 MG/2ML IJ SOLN: 4.0000 mg | INTRAMUSCULAR | Status: DC | PRN

## 2020-07-03 MED ORDER — LACTATED RINGERS IV SOLN: INTRAVENOUS | Status: DC

## 2020-07-03 MED ORDER — SODIUM CHLORIDE 0.45 % IV SOLN: INTRAVENOUS | Status: DC

## 2020-07-03 MED ORDER — ALLOPURINOL 300 MG PO TABS
300.0000 mg | ORAL_TABLET | Freq: Every day | ORAL | Status: DC
  Filled 2020-07-03: qty 1

## 2020-07-03 MED ORDER — SODIUM CHLORIDE 0.9 % IR SOLN
3000.0000 mL | Status: DC
  Administered 2020-07-03 (×3): 3000 mL

## 2020-07-03 MED ORDER — ONDANSETRON HCL 4 MG/2ML IJ SOLN
INTRAMUSCULAR | Status: DC | PRN
  Administered 2020-07-03: 4 mg via INTRAVENOUS

## 2020-07-03 MED ORDER — OXYCODONE HCL 5 MG PO TABS: 5.0000 mg | ORAL_TABLET | ORAL | Status: DC | PRN

## 2020-07-03 MED ORDER — ALLOPURINOL 100 MG PO TABS
100.0000 mg | ORAL_TABLET | Freq: Every day | ORAL | Status: DC
  Filled 2020-07-03: qty 1

## 2020-07-03 MED ORDER — BACITRACIN-NEOMYCIN-POLYMYXIN 400-5-5000 EX OINT
1.0000 "application " | TOPICAL_OINTMENT | Freq: Three times a day (TID) | CUTANEOUS | Status: DC | PRN
  Administered 2020-07-03: 1 via TOPICAL

## 2020-07-03 MED ORDER — ACETAMINOPHEN 325 MG PO TABS
650.0000 mg | ORAL_TABLET | ORAL | Status: DC | PRN
  Administered 2020-07-03 – 2020-07-04 (×3): 650 mg via ORAL

## 2020-07-03 MED ORDER — METOPROLOL SUCCINATE ER 25 MG PO TB24
25.0000 mg | ORAL_TABLET | Freq: Every day | ORAL | Status: DC
  Filled 2020-07-03: qty 1

## 2020-07-03 MED ORDER — BELLADONNA ALKALOIDS-OPIUM 16.2-30 MG RE SUPP
RECTAL | Status: AC
  Filled 2020-07-03: qty 1

## 2020-07-03 MED ORDER — SENNA 8.6 MG PO TABS
1.0000 | ORAL_TABLET | Freq: Two times a day (BID) | ORAL | Status: DC
  Administered 2020-07-03 (×2): 8.6 mg via ORAL

## 2020-07-03 MED ORDER — LIDOCAINE 2% (20 MG/ML) 5 ML SYRINGE
INTRAMUSCULAR | Status: AC
  Filled 2020-07-03: qty 5

## 2020-07-03 MED ORDER — FENTANYL CITRATE (PF) 100 MCG/2ML IJ SOLN: 25.0000 ug | INTRAMUSCULAR | Status: DC | PRN

## 2020-07-03 MED ORDER — FLECAINIDE ACETATE 50 MG PO TABS
50.0000 mg | ORAL_TABLET | Freq: Two times a day (BID) | ORAL | Status: DC
  Administered 2020-07-03: 50 mg via ORAL
  Filled 2020-07-03: qty 1

## 2020-07-03 MED ORDER — LIDOCAINE 2% (20 MG/ML) 5 ML SYRINGE
INTRAMUSCULAR | Status: DC | PRN
  Administered 2020-07-03: 60 mg via INTRAVENOUS

## 2020-07-03 MED ORDER — FENTANYL CITRATE (PF) 100 MCG/2ML IJ SOLN
INTRAMUSCULAR | Status: DC | PRN
  Administered 2020-07-03: 50 ug via INTRAVENOUS
  Administered 2020-07-03: 25 ug via INTRAVENOUS

## 2020-07-03 MED ORDER — EPHEDRINE SULFATE-NACL 50-0.9 MG/10ML-% IV SOSY
PREFILLED_SYRINGE | INTRAVENOUS | Status: DC | PRN
  Administered 2020-07-03 (×2): 10 mg via INTRAVENOUS

## 2020-07-03 SURGICAL SUPPLY — 33 items
BAG DRAIN URO-CYSTO SKYTR STRL (DRAIN) ×3 IMPLANT
BAG DRN RND TRDRP ANRFLXCHMBR (UROLOGICAL SUPPLIES) ×1
BAG DRN UROCATH (DRAIN) ×1
BAG URINE DRAIN 2000ML AR STRL (UROLOGICAL SUPPLIES) ×3 IMPLANT
BAG URINE LEG 500ML (DRAIN) IMPLANT
CATH FOLEY 2WAY SLVR  5CC 20FR (CATHETERS)
CATH FOLEY 2WAY SLVR  5CC 22FR (CATHETERS)
CATH FOLEY 2WAY SLVR 30CC 22FR (CATHETERS) IMPLANT
CATH FOLEY 2WAY SLVR 5CC 20FR (CATHETERS) IMPLANT
CATH FOLEY 2WAY SLVR 5CC 22FR (CATHETERS) IMPLANT
CATH FOLEY 3WAY 30CC 22F (CATHETERS) IMPLANT
CATH HEMA 3WAY 30CC 22FR COUDE (CATHETERS) ×2 IMPLANT
CATH HEMA 3WAY 30CC 24FR COUDE (CATHETERS) IMPLANT
CATH HEMA 3WAY 30CC 24FR RND (CATHETERS) IMPLANT
CLOTH BEACON ORANGE TIMEOUT ST (SAFETY) ×3 IMPLANT
ELECT REM PT RETURN 9FT ADLT (ELECTROSURGICAL) ×3
ELECTRODE REM PT RTRN 9FT ADLT (ELECTROSURGICAL) ×1 IMPLANT
EVACUATOR MICROVAS BLADDER (UROLOGICAL SUPPLIES) IMPLANT
GLOVE BIO SURGEON STRL SZ8 (GLOVE) ×3 IMPLANT
GOWN STRL REUS W/TWL XL LVL3 (GOWN DISPOSABLE) ×3 IMPLANT
HOLDER FOLEY CATH W/STRAP (MISCELLANEOUS) ×2 IMPLANT
IV NS IRRIG 3000ML ARTHROMATIC (IV SOLUTION) ×12 IMPLANT
KIT TURNOVER CYSTO (KITS) ×3 IMPLANT
LOOP CUT BIPOLAR 24F LRG (ELECTROSURGICAL) ×3 IMPLANT
MANIFOLD NEPTUNE II (INSTRUMENTS) ×3 IMPLANT
PACK CYSTO (CUSTOM PROCEDURE TRAY) ×3 IMPLANT
PLUG CATH AND CAP STER (CATHETERS) IMPLANT
SYR 30ML LL (SYRINGE) ×2 IMPLANT
SYR TOOMEY IRRIG 70ML (MISCELLANEOUS) ×3
SYRINGE TOOMEY IRRIG 70ML (MISCELLANEOUS) IMPLANT
TUBE CONNECTING 12'X1/4 (SUCTIONS) ×1
TUBE CONNECTING 12X1/4 (SUCTIONS) ×2 IMPLANT
TUBING UROLOGY SET (TUBING) ×2 IMPLANT

## 2020-07-03 NOTE — Hospital Course (Signed)
Preoperative diagnosis: Bladder outlet obstruction secondary to BPH  Postoperative diagnosis:  Bladder outlet obstruction secondary to BPH  Procedure:  Cystoscopy Transurethral resection of the prostate  Surgeon: Lillette Boxer. Levante Simones, M.D.  Anesthesia: General  Complications: None  Drain: Foley catheter  EBL: Minimal  Specimens: Prostate chips  Disposition of specimens: Pathology  Indication: Jimmy Bradshaw is a patient with bladder outlet obstruction secondary to benign prostatic hyperplasia.  He had a previous TURP by Dr. Risa Grill in 2014.  Cystoscopy done in the office because of recurrent obstructive symptomatology revealed significant prostatic regrowth.  After reviewing the management options for treatment, he elected to proceed with the above surgical procedure(s). We have discussed the potential benefits and risks of the procedure, side effects of the proposed treatment, the likelihood of the patient achieving the goals of the procedure, and any potential problems that might occur during the procedure or recuperation. Informed consent has been obtained.  Description of procedure:  The patient was identified in the holding area. He received preoperative antibiotics. He was then taken to the operating room. General anesthetic was administered.  The patient was then placed in the dorsal lithotomy position, prepped and draped in the usual sterile fashion. Timeout was then performed.  A resectoscope sheath was placed using the visual obturator.   The bladder was then systematically examined in its entirety. There was no evidence of  tumors, stones, or other mucosal pathology.The resectoscope, loop and telescope were then placed.  The ureteral orifices were identified so as to be avoided during the procedure.  The prostate adenoma was then resected utilizing loop cautery resection with the bipolar cutting loop.  The prostate adenoma from the bladder neck back to the verumontanum  was resected beginning at the six o'clock position and then extended to include the right and left lobes of the prostate and anterior prostate, respectively. Care was taken not to resect distal to the verumontanum.  Hemostasis was then achieved with the cautery and the bladder was emptied and reinspected with no significant bleeding noted at the end of the procedure.  Resected chips were irrigated from the bladder with the evacuator and sent to pathology.  A 3 way catheter was then placed into the bladder and placed on continuous bladder irrigation.  The patient appeared to tolerate the procedure well and without complications. The patient was able to be awakened and transferred to the recovery unit in satisfactory condition. He tolerated the procedure well.

## 2020-07-03 NOTE — Anesthesia Preprocedure Evaluation (Signed)
Anesthesia Evaluation  Patient identified by MRN, date of birth, ID band Patient awake    Reviewed: Allergy & Precautions, NPO status , Patient's Chart, lab work & pertinent test results  Airway Mallampati: II  TM Distance: >3 FB Neck ROM: Full    Dental  (+) Dental Advisory Given   Pulmonary former smoker,    breath sounds clear to auscultation       Cardiovascular hypertension, Pt. on medications and Pt. on home beta blockers + dysrhythmias Atrial Fibrillation  Rhythm:Regular Rate:Normal     Neuro/Psych negative neurological ROS     GI/Hepatic negative GI ROS, Neg liver ROS,   Endo/Other  diabetes, Type 2  Renal/GU negative Renal ROS     Musculoskeletal  (+) Arthritis ,   Abdominal   Peds  Hematology  (+) Blood dyscrasia, ,   Anesthesia Other Findings   Reproductive/Obstetrics                             Anesthesia Physical Anesthesia Plan  ASA: III  Anesthesia Plan: General   Post-op Pain Management:    Induction: Intravenous  PONV Risk Score and Plan: 2 and Dexamethasone, Ondansetron and Treatment may vary due to age or medical condition  Airway Management Planned: LMA and Oral ETT  Additional Equipment: None  Intra-op Plan:   Post-operative Plan: Extubation in OR  Informed Consent: I have reviewed the patients History and Physical, chart, labs and discussed the procedure including the risks, benefits and alternatives for the proposed anesthesia with the patient or authorized representative who has indicated his/her understanding and acceptance.     Dental advisory given  Plan Discussed with: CRNA  Anesthesia Plan Comments:         Anesthesia Quick Evaluation

## 2020-07-03 NOTE — Op Note (Signed)
Preoperative diagnosis: 1. Bladder outlet obstruction secondary to BPH  Postoperative diagnosis:  1. Bladder outlet obstruction secondary to BPH  Procedure:  1. Cystoscopy 2. Transurethral resection of the prostate  Surgeon: Lillette Boxer. Terrica Duecker, M.D.  Anesthesia: General  Complications: None  Drain: Foley catheter  EBL: Minimal  Specimens: 1. Prostate chips  Disposition of specimens: Pathology  Indication: Jimmy Bradshaw is a patient with bladder outlet obstruction secondary to benign prostatic hyperplasia. After reviewing the management options for treatment, he elected to proceed with the above surgical procedure(s). We have discussed the potential benefits and risks of the procedure, side effects of the proposed treatment, the likelihood of the patient achieving the goals of the procedure, and any potential problems that might occur during the procedure or recuperation. Informed consent has been obtained.  Description of procedure:  The patient was identified in the holding area. He received preoperative antibiotics. He was then taken to the operating room. General anesthetic was administered.  The patient was then placed in the dorsal lithotomy position, prepped and draped in the usual sterile fashion. Timeout was then performed.  A resectoscope sheath was placed using the visual obturator.   The bladder was then systematically examined in its entirety. There was no evidence of  tumors, stones, or other mucosal pathology.The resectoscope, loop and telescope were then placed.  The ureteral orifices were identified so as to be avoided during the procedure.  The prostate adenoma was then resected utilizing loop cautery resection with the bipolar cutting loop.  The prostate adenoma from the bladder neck back to the verumontanum was resected beginning at the six o'clock position and then extended to include the right and left lobes of the prostate and anterior prostate,  respectively. Care was taken not to resect distal to the verumontanum.  Hemostasis was then achieved with the cautery and the bladder was emptied and reinspected with no significant bleeding noted at the end of the procedure.  Resected chips were irrigated from the bladder with the evacuator and sent to pathology.  A 3 way catheter was then placed into the bladder and placed on continuous bladder irrigation.  The patient appeared to tolerate the procedure well and without complications. The patient was able to be awakened and transferred to the recovery unit in satisfactory condition. He tolerated the procedure well.

## 2020-07-03 NOTE — Transfer of Care (Signed)
Immediate Anesthesia Transfer of Care Note  Patient: Jimmy Bradshaw  Procedure(s) Performed: Procedure(s) (LRB): TRANSURETHRAL RESECTION OF THE PROSTATE (TURP) (N/A)  Patient Location: PACU  Anesthesia Type: General  Level of Consciousness: awake, oriented, sedated and patient cooperative  Airway & Oxygen Therapy: Patient Spontanous Breathing and Patient connected to face mask oxygen  Post-op Assessment: Report given to PACU RN and Post -op Vital signs reviewed and stable  Post vital signs: Reviewed and stable  Complications: No apparent anesthesia complications  Last Vitals:  Vitals Value Taken Time  BP    Temp    Pulse 87 07/03/20 1114  Resp 14 07/03/20 1114  SpO2 97 % 07/03/20 1114  Vitals shown include unvalidated device data.  Last Pain:  Vitals:   07/03/20 0736  TempSrc: Oral  PainSc: 0-No pain         Complications: No complications documented.

## 2020-07-03 NOTE — Anesthesia Procedure Notes (Signed)
Performed by: Jermone Geister D, CRNA       

## 2020-07-03 NOTE — Interval H&P Note (Signed)
History and Physical Interval Note:  07/03/2020 10:12 AM  Jimmy Bradshaw  has presented today for surgery, with the diagnosis of BENIGN PROSTATIC HYPERPLASIA.  The various methods of treatment have been discussed with the patient and family. After consideration of risks, benefits and other options for treatment, the patient has consented to  Procedure(s) with comments: [place] (TURP) (N/A) - 1 HR as a surgical intervention.  The patient's history has been reviewed, patient examined, no change in status, stable for surgery.  I have reviewed the patient's chart and labs.  Questions were answered to the patient's satisfaction.     Lillette Boxer Eirene Rather

## 2020-07-03 NOTE — Anesthesia Procedure Notes (Signed)
Procedure Name: LMA Insertion Date/Time: 07/03/2020 10:18 AM Performed by: Suan Halter, CRNA Pre-anesthesia Checklist: Patient identified, Emergency Drugs available, Suction available and Patient being monitored Patient Re-evaluated:Patient Re-evaluated prior to induction Oxygen Delivery Method: Circle system utilized Preoxygenation: Pre-oxygenation with 100% oxygen Induction Type: IV induction Ventilation: Mask ventilation without difficulty LMA: LMA inserted LMA Size: 4.0 Number of attempts: 1 Airway Equipment and Method: Bite block Placement Confirmation: positive ETCO2 Tube secured with: Tape Dental Injury: Teeth and Oropharynx as per pre-operative assessment

## 2020-07-03 NOTE — Anesthesia Postprocedure Evaluation (Signed)
Anesthesia Post Note  Patient: Jimmy Bradshaw  Procedure(s) Performed: TRANSURETHRAL RESECTION OF THE PROSTATE (TURP) (N/A Prostate)     Patient location during evaluation: PACU Anesthesia Type: General Level of consciousness: awake and alert Pain management: pain level controlled Vital Signs Assessment: post-procedure vital signs reviewed and stable Respiratory status: spontaneous breathing, nonlabored ventilation, respiratory function stable and patient connected to nasal cannula oxygen Cardiovascular status: blood pressure returned to baseline and stable Postop Assessment: no apparent nausea or vomiting Anesthetic complications: no   No complications documented.  Last Vitals:  Vitals:   07/03/20 1230 07/03/20 1300  BP: (!) 137/91 134/88  Pulse: 68 71  Resp: 15 17  Temp:    SpO2: 94% 97%    Last Pain:  Vitals:   07/03/20 1300  TempSrc:   PainSc: 0-No pain                 [name]

## 2020-07-03 NOTE — Discharge Instructions (Signed)
Transurethral Resection of the Prostate ° °Care After ° °Refer to this sheet in the next few weeks. These discharge instructions provide you with general information on caring for yourself after you leave the hospital. Your caregiver may also give you specific instructions. Your treatment has been planned according to the most current medical practices available, but unavoidable complications sometimes occur. If you have any problems or questions after discharge, please call your caregiver. ° °HOME CARE INSTRUCTIONS  ° °Medications °· You may receive medicine for pain management. As your level of discomfort decreases, adjustments in your pain medicines may be made.  °· Take all medicines as directed.  °· You may be given a medicine (antibiotic) to kill germs following surgery. Finish all medicines. Let your caregiver know if you have any side effects or problems from the medicine.  °· If you are on aspirin, it would be best not to restart the aspirin until the blood in the urine clears °Hygiene °· You can take a shower after surgery.  °· You should not take a bath while you still have the urethral catheter. °Activity °· You will be encouraged to get out of bed as much as possible and increase your activity level as tolerated.  °· Spend the first week in and around your home. For 3 weeks, avoid the following:  °· Straining.  °· Running.  °· Strenuous work.  °· Walks longer than a few blocks.  °· Riding for extended periods.  °· Sexual relations.  °· Do not lift heavy objects (more than 20 pounds) for at least 1 month. When lifting, use your arms instead of your abdominal muscles.  °· You will be encouraged to walk as tolerated. Do not exert yourself. Increase your activity level slowly. Remember that it is important to keep moving after an operation of any type. This cuts down on the possibility of developing blood clots.  °· Your caregiver will tell you when you can resume driving and light housework. Discuss this  at your first office visit after discharge. °Diet °· No special diet is ordered after a TURP. However, if you are on a special diet for another medical problem, it should be continued.  °· Normal fluid intake is usually recommended.  °· Avoid alcohol and caffeinated drinks for 2 weeks. They irritate the bladder. Decaffeinated drinks are okay.  °· Avoid spicy foods.  °Bladder Function °· For the first 10 days, empty the bladder whenever you feel a definite desire. Do not try to hold the urine for long periods of time.  °· Urinating once or twice a night even after you are healed is not uncommon.  °· You may see some recurrence of blood in the urine after discharge from the hospital. This usually happens within 2 weeks after the procedure.If this occurs, force fluids again as you did in the hospital and reduce your activity.  °Bowel Function °· You may experience some constipation after surgery. This can be minimized by increasing fluids and fiber in your diet. Drink enough water and fluids to keep your urine clear or pale yellow.  °· A stool softener may be prescribed for use at home. Do not strain to move your bowels.  °· If you are requiring increased pain medicine, it is important that you take stool softeners to prevent constipation. This will help to promote proper healing by reducing the need to strain to move your bowels.  °Sexual Activity °· Semen movement in the opposite direction and into the bladder (  retrograde ejaculation) may occur. Since the semen passes into the bladder, cloudy urine can occur the first time you urinate after intercourse. Or, you may not have an ejaculation during erection. Ask your caregiver when you can resume sexual activity. Retrograde ejaculation and reduced semen discharge should not reduce one's pleasure of intercourse.  °Postoperative Visit °· Arrange the date and time of your after surgery visit with your caregiver.  °Return to Work °· After your recovery is complete, you will  be able to return to work and resume all activities. Your caregiver will inform you when you can return to work.  °Foley Catheter Care °A soft, flexible tube (Foley catheter) may have been placed in your bladder to drain urine and fluid. Follow these instructions: °Taking Care of the Catheter °· Keep the area where the catheter leaves your body clean.  °· Attach the catheter to the leg so there is no tension on the catheter.  °· Keep the drainage bag below the level of the bladder, but keep it OFF the floor.  °· Do not take long soaking baths. Your caregiver will give instructions about showering.  °· Wash your hands before touching ANYTHING related to the catheter or bag.  °· Using mild soap and warm water on a washcloth:  °· Clean the area closest to the catheter insertion site using a circular motion around the catheter.  °· Clean the catheter itself by wiping AWAY from the insertion site for several inches down the tube.  °· NEVER wipe upward as this could sweep bacteria up into the urethra (tube in your body that normally drains the bladder) and cause infection.  °· Place a small amount of sterile lubricant at the tip of the penis where the catheter is entering.  °Taking Care of the Drainage Bags °· Two drainage bags may be taken home: a large overnight drainage bag, and a smaller leg bag which fits underneath clothing.  °· It is okay to wear the overnight bag at any time, but NEVER wear the smaller leg bag at night.  °· Keep the drainage bag well below the level of your bladder. This prevents backflow of urine into the bladder and allows the urine to drain freely.  °· Anchor the tubing to your leg to prevent pulling or tension on the catheter. Use tape or a leg strap provided by the hospital.  °· Empty the drainage bag when it is 1/2 to 3/4 full. Wash your hands before and after touching the bag.  °· Periodically check the tubing for kinks to make sure there is no pressure on the tubing which could restrict  the flow of urine.  °Changing the Drainage Bags °· Cleanse both ends of the clean bag with alcohol before changing.  °· Pinch off the rubber catheter to avoid urine spillage during the disconnection.  °· Disconnect the dirty bag and connect the clean one.  °· Empty the dirty bag carefully to avoid a urine spill.  °· Attach the new bag to the leg with tape or a leg strap.  °Cleaning the Drainage Bags °· Whenever a drainage bag is disconnected, it must be cleaned quickly so it is ready for the next use.  °· Wash the bag in warm, soapy water.  °· Rinse the bag thoroughly with warm water.  °· Soak the bag for 30 minutes in a solution of white vinegar and water (1 cup vinegar to 1 quart warm water).  °· Rinse with warm water.  °SEEK MEDICAL   CARE IF:  °· You have chills or night sweats.  °· You are leaking around your catheter or have problems with your catheter. It is not uncommon to have sporadic leakage around your catheter as a result of bladder spasms. If the leakage stops, there is not much need for concern. If you are uncertain, call your caregiver.  °· You develop side effects that you think are coming from your medicines.  °SEEK IMMEDIATE MEDICAL CARE IF:  °· You are suddenly unable to urinate. Check to see if there are any kinks in the drainage tubing that may cause this. If you cannot find any kinks, call your caregiver immediately. This is an emergency.  °· You develop shortness of breath or chest pains.  °· Bleeding persists or clots develop in your urine.  °· You have a fever.  °· You develop pain in your back or over your lower belly (abdomen).  °· You develop pain or swelling in your legs.  °· Any problems you are having get worse rather than better.  °MAKE SURE YOU:  °· Understand these instructions.  °· Will watch your condition.  °· Will get help right away if you are not doing well or get worse.  °Document Released: 09/06/2005 Document Revised: 05/19/2011 Document Reviewed: 04/30/2009 °[employer]®  Patient Information ©2012 [employer].Transurethral Resection of the Prostate °Care After °Refer to this sheet in the next few weeks. These discharge instructions provide you with general information on caring for yourself after you leave the hospital. Your caregiver may also give you specific instructions. Your treatment has been planned according to the most current medical practices available, but unavoidable complications sometimes occur. If you have any problems or questions after discharge, please call your caregiver. °

## 2020-07-04 ENCOUNTER — Encounter (HOSPITAL_BASED_OUTPATIENT_CLINIC_OR_DEPARTMENT_OTHER): Payer: Self-pay | Admitting: Urology

## 2020-07-04 LAB — SURGICAL PATHOLOGY

## 2020-07-04 MED ORDER — ACETAMINOPHEN 325 MG PO TABS
ORAL_TABLET | ORAL | Status: AC
  Filled 2020-07-04: qty 2

## 2020-07-04 MED ORDER — SULFAMETHOXAZOLE-TRIMETHOPRIM 800-160 MG PO TABS: 1.0000 | ORAL_TABLET | Freq: Two times a day (BID) | ORAL | 0 refills | Status: DC

## 2020-07-04 NOTE — Discharge Summary (Signed)
.dis Patient ID: [id number] Jimmy Bradshaw MRN: [id number] DOB/AGE: Aug 03, 1946 74 y.o.  Admit date: 07/03/2020 Discharge date: 07/04/2020  Primary Care Physician:  Alycia Rossetti, MD  Discharge Diagnoses:  N40.1 Present on Admission: . BPH with urinary obstruction     Discharge Medications: Allergies as of 07/04/2020      Reactions   Pravastatin Other (See Comments)   Headache and legs hurt   Penicillins Rash   Had penicillin shot w/o reaction 2018      Medication List    STOP taking these medications   Eliquis 5 MG Tabs tablet Generic drug: apixaban     TAKE these medications   allopurinol 100 MG tablet Commonly known as: ZYLOPRIM Take 1 tablet (100 mg total) by mouth daily.   allopurinol 300 MG tablet Commonly known as: ZYLOPRIM Take 1 tablet (300 mg total) by mouth daily.   buPROPion 300 MG 24 hr tablet Commonly known as: WELLBUTRIN XL TAKE 1 TABLET EVERY DAY   flecainide 50 MG tablet Commonly known as: TAMBOCOR TAKE 1 TABLET TWICE DAILY   gabapentin 100 MG capsule Commonly known as: NEURONTIN TAKE ONE TO THREE CAPSULES AT BEDTIME FOR NERVE PAIN AS NEEDED   lisinopril 2.5 MG tablet Commonly known as: ZESTRIL TAKE 1 TABLET EVERY DAY   metoprolol succinate 25 MG 24 hr tablet Commonly known as: TOPROL-XL Take 1 tablet (25 mg total) by mouth daily.   Mitigare 0.6 MG Caps Generic drug: Colchicine Take 1 cap (0.6 mg total) by mouth daily as needed. May repeat in 2 hours   rosuvastatin 10 MG tablet Commonly known as: CRESTOR TAKE 1 TABLET EVERY DAY What changed:   how much to take  how to take this  when to take this   sulfamethoxazole-trimethoprim 800-160 MG tablet Commonly known as: BACTRIM DS Take 1 tablet by mouth every 12 (twelve) hours.        Significant Diagnostic Studies:  No results found.  Brief H and P: For complete details please refer to admission H and P, but in brief pt admitted for TURP  Hospital Course: No postop issues,  d/ced pod 1 Active Problems:   BPH with urinary obstruction   Day of Discharge BP (!) 152/89 (BP Location: Right Arm)   Pulse 80   Temp 98.8 F (37.1 C)   Resp 16   Ht 5\' 11"  (1.803 m)   Wt 98.2 kg   SpO2 99%   BMI 30.20 kg/m   Results for orders placed or performed during the hospital encounter of 07/03/20 (from the past 24 hour(s))  I-STAT, chem 8     Status: Abnormal   Collection Time: 07/03/20  8:21 AM  Result Value Ref Range   Sodium 140 135 - 145 mmol/L   Potassium 4.8 3.5 - 5.1 mmol/L   Chloride 107 98 - 111 mmol/L   BUN 30 (H) 8 - 23 mg/dL   Creatinine, Ser 1.10 0.61 - 1.24 mg/dL   Glucose, Bld 136 (H) 70 - 99 mg/dL   Calcium, Ion 1.24 1.15 - 1.40 mmol/L   TCO2 20 (L) 22 - 32 mmol/L   Hemoglobin 15.0 13.0 - 17.0 g/dL   HCT 44.0 39 - 52 %  Glucose, capillary     Status: Abnormal   Collection Time: 07/03/20 11:18 AM  Result Value Ref Range   Glucose-Capillary 132 (H) 70 - 99 mg/dL    Physical Exam: General: Alert and awake oriented x3 not in any acute distress. HEENT: anicteric sclera, pupils  reactive to light and accommodation CVS: S1-S2 clear no murmur rubs or gallops Chest: clear to auscultation bilaterally, no wheezing rales or rhonchi Abdomen: soft nontender, nondistended, normal bowel sounds, no organomegaly Extremities: no cyanosis, clubbing or edema noted bilaterally Neuro: Cranial nerves II-XII intact, no focal neurological deficits  Disposition:  Home  Diet:  Regular  Activity:  Discussed w/ pt and wife   TESTS THAT NEED FOLLOW-UP   path check  DISCHARGE FOLLOW-UP   Follow-up Information    Jed Limerick, NP.   Specialty: Urology Why: 11.3.2021 # 3:45 pm Contact information: [place] [address] [phone]               Time spent on Discharge:   10 mins  Signed: Lillette Boxer Kadija Cruzen 07/04/2020, 7:47 AM

## 2020-07-14 ENCOUNTER — Ambulatory Visit: Payer: Medicare HMO

## 2020-07-14 ENCOUNTER — Encounter: Payer: Self-pay | Admitting: Podiatrist

## 2020-07-14 ENCOUNTER — Ambulatory Visit: Payer: Medicare HMO | Admitting: Podiatrist

## 2020-07-14 ENCOUNTER — Other Ambulatory Visit: Payer: Self-pay

## 2020-07-14 DIAGNOSIS — M722 Plantar fascial fibromatosis: Secondary | ICD-10-CM

## 2020-07-14 NOTE — Progress Notes (Signed)
Chief Complaint  Patient presents with  . Plantar Fasciitis    Pt stated that he is concerned about the shoes he wears because they cause his heel to hurt and wants to know why its just those shoes      HPI: Patient is 74 y.o. male who presents today for follow-up plantar fasciitis on the left foot.  He states that the last injection was significantly beneficial and he has had minimal problems since.  He mainly has questions about shoes and presents today wearing a good pair of new balance sneakers.  He brings in a pair of all birds shoes and relates he has pain mostly when he wears the shoes.   Allergies  Allergen Reactions  . Pravastatin Other (See Comments)    Headache and legs hurt  . Penicillins Rash    Had penicillin shot w/o reaction 2018    Review of systems is reviewed and negative.   Physical Exam  Patient is awake, alert, and oriented x 3.  In no acute distress.    Vascular status is intact with palpable pedal pulses DP and PT bilateral and capillary refill time less than 3 seconds bilateral.  No edema or erythema noted.  Neurological exam reveals epicritic and protective sensation grossly intact bilateral.  Dermatological exam reveals skin is supple and dry to bilateral feet.  No open lesions present.   Musculoskeletal exam: Musculature intact with dorsiflexion, plantarflexion, inversion, eversion.  High arch foot type is noted.  No pain on palpation plantar medial aspect or plantar central aspect of left heel is noted at today's visit.   Assessment: Plantar fasciitis left heel  Plan: Discussed shoe gear and shoe recommendations with the patient.  Discussed that the all birds are very mobile shoe that do not support the foot as well as the new balance that he currently is wearing.  Also discussed he could try the [facility] brand of shoes to see if these would be comfortable for him as well.  He will be seen back if the plantar fasciitis returns otherwise he will be seen back  as needed for follow-up.

## 2020-07-15 DIAGNOSIS — D487 Neoplasm of uncertain behavior of other specified sites: Secondary | ICD-10-CM | POA: Diagnosis not present

## 2020-07-23 DIAGNOSIS — R3912 Poor urinary stream: Secondary | ICD-10-CM | POA: Diagnosis not present

## 2020-07-23 DIAGNOSIS — R310 Gross hematuria: Secondary | ICD-10-CM | POA: Diagnosis not present

## 2020-07-23 DIAGNOSIS — N400 Enlarged prostate without lower urinary tract symptoms: Secondary | ICD-10-CM | POA: Diagnosis not present

## 2020-07-23 DIAGNOSIS — R300 Dysuria: Secondary | ICD-10-CM | POA: Diagnosis not present

## 2020-07-24 ENCOUNTER — Telehealth: Payer: Self-pay | Admitting: Cardiovascular Disease

## 2020-07-24 DIAGNOSIS — I483 Typical atrial flutter: Secondary | ICD-10-CM

## 2020-07-24 DIAGNOSIS — I480 Paroxysmal atrial fibrillation: Secondary | ICD-10-CM

## 2020-07-24 MED ORDER — APIXABAN 5 MG PO TABS: 5.0000 mg | ORAL_TABLET | Freq: Two times a day (BID) | ORAL | 11 refills | Status: DC

## 2020-07-24 NOTE — Telephone Encounter (Signed)
Returned call to patient regarding restarting Eliquis s/p TURP. Patient reports that blood count was checked yesterday at [facility] and was reported to him as being normal. I advised I will call to ask for that to be faxed to [place].  I advised that per Dr. Acie Fredrickson, he should restart Eliquis 5 mg BID and monitor color of urine. I advised him to come in for CBC in 2 weeks on 11/18 and to call our office prior to that time if he notices increased blood in urine or if he has other concerns. He reports BP was low at office visit yesterday and he is very fatigued. I advised that he may try splitting his Toprol dose to 1/2 (12.5 mg) daily and continue to monitor BP and level of fatigue. He verbalized understanding and agreement with plan of care and thanked me for the call.

## 2020-07-24 NOTE — Telephone Encounter (Signed)
New Message:      Pt says he does not want to talk to anybody but Sharyn Lull please. He says this is concerning him starting his Eliquis back.

## 2020-08-06 DIAGNOSIS — N401 Enlarged prostate with lower urinary tract symptoms: Secondary | ICD-10-CM | POA: Diagnosis not present

## 2020-08-06 DIAGNOSIS — R310 Gross hematuria: Secondary | ICD-10-CM | POA: Diagnosis not present

## 2020-08-07 ENCOUNTER — Other Ambulatory Visit: Payer: Self-pay

## 2020-08-07 ENCOUNTER — Other Ambulatory Visit: Payer: Medicare HMO | Admitting: *Deleted

## 2020-08-07 DIAGNOSIS — I483 Typical atrial flutter: Secondary | ICD-10-CM

## 2020-08-07 DIAGNOSIS — I480 Paroxysmal atrial fibrillation: Secondary | ICD-10-CM | POA: Diagnosis not present

## 2020-08-08 LAB — CBC
Hematocrit: 44.6 % (ref 37.5–51.0)
Hemoglobin: 15.1 g/dL (ref 13.0–17.7)
MCH: 31.4 pg (ref 26.6–33.0)
MCHC: 33.9 g/dL (ref 31.5–35.7)
MCV: 93 fL (ref 79–97)
Platelets: 205 10*3/uL (ref 150–450)
RBC: 4.81 x10E6/uL (ref 4.14–5.80)
RDW: 13.3 % (ref 11.6–15.4)
WBC: 8 10*3/uL (ref 3.4–10.8)

## 2020-08-25 NOTE — Telephone Encounter (Signed)
Pt c/o medication issue:  1. Name of Medication: apixaban (ELIQUIS) 5 MG TABS tablet  2. How are you currently taking this medication (dosage and times per day)? Patient not currently taking.  3. Are you having a reaction (difficulty breathing--STAT)? No   4. What is your medication issue? Patient is calling to follow up regarding post-op medication instructions. He states he has not taken his Eliquis in about 8 weeks and this past weekend he experienced some bleeding. He would like to know when to begin taking again. Please advise.

## 2020-08-25 NOTE — Telephone Encounter (Signed)
Returned call to patient who states he restarted Eliquis one tablet daily several weeks ago and then 10 days ago he started the twice daily dose.  Reports on Friday he passed a large blood clot when he urinated but no further bleeding until Sunday morning when urine was bright red.  He held Eliquis yesterday, took it this morning and so far no further bleeding. I advised him to call [facility] to get their advice and in the meantime to continue Eliquis BID. He was advised by urology approximately 3-4 weeks ago that he could restart Eliquis. I advised him to call if bleeding persists or if urology advises that he hold Eliquis. He verbalized understanding and agreement and thanked me for the call.

## 2020-09-25 ENCOUNTER — Telehealth: Payer: Self-pay | Admitting: *Deleted

## 2020-09-25 NOTE — Telephone Encounter (Signed)
Received call from patient.   Reports that he has increased abd cramping and constipation. States that he has been taking Miralax with no relief.   Advised to use Ducloax suppository and continue miralax. Increase water intake.   If patient has not had BM today, contact office for appointment on 09/26/2020.

## 2020-09-30 DIAGNOSIS — N201 Calculus of ureter: Secondary | ICD-10-CM | POA: Diagnosis not present

## 2020-09-30 DIAGNOSIS — R1111 Vomiting without nausea: Secondary | ICD-10-CM | POA: Diagnosis not present

## 2020-09-30 DIAGNOSIS — N200 Calculus of kidney: Secondary | ICD-10-CM | POA: Diagnosis not present

## 2020-09-30 DIAGNOSIS — N1330 Unspecified hydronephrosis: Secondary | ICD-10-CM | POA: Diagnosis not present

## 2020-10-14 DIAGNOSIS — N201 Calculus of ureter: Secondary | ICD-10-CM | POA: Diagnosis not present

## 2020-10-14 DIAGNOSIS — R5383 Other fatigue: Secondary | ICD-10-CM | POA: Diagnosis not present

## 2020-10-16 ENCOUNTER — Other Ambulatory Visit: Payer: Self-pay | Admitting: Cardiovascular Disease

## 2020-10-16 ENCOUNTER — Other Ambulatory Visit: Payer: Self-pay | Admitting: Family Medicine

## 2020-10-17 ENCOUNTER — Ambulatory Visit (INDEPENDENT_AMBULATORY_CARE_PROVIDER_SITE_OTHER): Payer: Medicare HMO | Admitting: Family Medicine

## 2020-10-17 ENCOUNTER — Other Ambulatory Visit: Payer: Self-pay

## 2020-10-17 VITALS — BP 118/78 | HR 64 | Temp 97.8°F | Resp 15 | Ht 71.0 in | Wt 214.0 lb

## 2020-10-17 DIAGNOSIS — R6889 Other general symptoms and signs: Secondary | ICD-10-CM | POA: Diagnosis not present

## 2020-10-17 DIAGNOSIS — R5382 Chronic fatigue, unspecified: Secondary | ICD-10-CM | POA: Diagnosis not present

## 2020-10-17 DIAGNOSIS — E1140 Type 2 diabetes mellitus with diabetic neuropathy, unspecified: Secondary | ICD-10-CM

## 2020-10-17 NOTE — Progress Notes (Signed)
Subjective:    Patient ID: Jimmy Bradshaw, male    DOB: 07/25/1946, 75 y.o.   MRN: [id number]  HPI  Patient is a very pleasant 75 year old Caucasian male here today for fatigue.  Patient has recently had surgery/TURP for BPH.  Postoperative course was complicated by bleeding.  He also had a kidney stone.  Therefore he had a protracted recovery.  However during that time he has developed severe fatigue.  He states he just does not have any energy.  He is also noticed a resting tremor in both hands.  It is a very fine high-frequency tremor seen at rest.  He states that sometimes will go away if he focuses on it.  He denies any tremor in his voice or tremor in his head.  He denies any loss of coordination.  He denies any falling or stumbling.  He denies any muscle weakness.  There is no history of seizures.  There is no masklike affect.  Blink reflex is normal in frequency.  Gait is normal.  He has no history of Parkinson's disease.  He denies any chest pain.  He denies any shortness of breath.  He denies any hemoptysis or pleurisy.  He denies any dyspnea on exertion.  He denies any melena or hematochezia.  He does have an occasional stomachache that he attributes to being hungry but no daily constant abdominal pain made worse by food.  He has lost 7 pounds since October which was unintentional. Past Medical History:  Diagnosis Date  . Anxiety   . Arthritis    HANDS,  SHOULDERS  . Bladder neck obstruction   . BPH (benign prostatic hypertrophy)   . Frequency of urination   . H/O: gout   . History of colon polyps   . Hyperlipidemia   . Nocturia   . PAF (paroxysmal atrial fibrillation) ([place])   . Type 2 diabetes mellitus (HCC)    diet controlled  . Urgency of urination   . Wears hearing aid    BILATERAL   Past Surgical History:  Procedure Laterality Date  . CARDIOVERSION N/A 10/26/2016   Procedure: CARDIOVERSION;  Surgeon: Larey Dresser, MD;  Location: [place];  Service: Cardiovascular;   Laterality: N/A;  . CARDIOVERSION N/A 04/17/2019   Procedure: CARDIOVERSION;  Surgeon: Sueanne Margarita, MD;  Location: [facility];  Service: Cardiovascular;  Laterality: N/A;  . COLONOSCOPY  08-30-2011, 2017  . I & D KNEE WITH POLY EXCHANGE Left 10/08/2014   Procedure: IRRIGATION AND DEBRIDEMENT LEFT UNI KNEE ARTHOPLASTY  WITH POLY EXCHANGE;  Surgeon: Mauri Pole, MD;  Location: [facility];  Service: Orthopedics;  Laterality: Left;  . KNEE ARTHROSCOPY Left 11/ 2013  . PARTIAL KNEE ARTHROPLASTY Left 02/05/2013   Procedure: LEFT KNEE MEDIAL UNICOMPARTMENTAL KNEE;  Surgeon: Mauri Pole, MD;  Location: [facility];  Service: Orthopedics;  Laterality: Left;  . PICC LINE INSERTION     and removed later  . TRANSURETHRAL RESECTION OF PROSTATE N/A 09/30/2014   Procedure: TRANSURETHRAL RESECTION OF THE PROSTATE WITH GYRUS INSTRUMENTS;  Surgeon: Bernestine Amass, MD;  Location: [facility];  Service: Urology;  Laterality: N/A;  . TRANSURETHRAL RESECTION OF PROSTATE N/A 07/03/2020   Procedure: TRANSURETHRAL RESECTION OF THE PROSTATE (TURP);  Surgeon: Franchot Gallo, MD;  [facility];  Service: Urology;  Laterality: N/A;  1 HR   Current Outpatient Medications on File Prior to Visit  Medication Sig Dispense Refill  . allopurinol (ZYLOPRIM) 100 MG tablet  Take 1 tablet (100 mg total) by mouth daily. 90 tablet 1  . allopurinol (ZYLOPRIM) 300 MG tablet TAKE 1 TABLET EVERY DAY 90 tablet 1  . apixaban (ELIQUIS) 5 MG TABS tablet Take 1 tablet (5 mg total) by mouth 2 (two) times daily. 60 tablet 11  . buPROPion (WELLBUTRIN XL) 300 MG 24 hr tablet TAKE 1 TABLET EVERY DAY 90 tablet 1  . flecainide (TAMBOCOR) 50 MG tablet Take 1 tablet (50 mg total) by mouth 2 (two) times daily. Please make yearly appt with Dr. Acie Fredrickson for April 2022 for future refills. Thank you 1st attempt 180 tablet 0  . gabapentin (NEURONTIN) 100 MG capsule TAKE ONE TO THREE CAPSULES AT BEDTIME FOR NERVE PAIN AS  NEEDED 90 capsule 3  . lisinopril (ZESTRIL) 2.5 MG tablet TAKE 1 TABLET EVERY DAY 90 tablet 3  . metoprolol succinate (TOPROL-XL) 25 MG 24 hr tablet Take 1 tablet (25 mg total) by mouth daily. 90 tablet 2  . MITIGARE 0.6 MG CAPS Take 1 cap (0.6 mg total) by mouth daily as needed. May repeat in 2 hours 30 capsule 3  . rosuvastatin (CRESTOR) 10 MG tablet TAKE 1 TABLET EVERY DAY (Patient taking differently: at bedtime. ) 90 tablet 3  . sulfamethoxazole-trimethoprim (BACTRIM DS) 800-160 MG tablet Take 1 tablet by mouth every 12 (twelve) hours. 6 tablet 0   No current facility-administered medications on file prior to visit.   Allergies  Allergen Reactions  . Pravastatin Other (See Comments)    Headache and legs hurt  . Penicillins Rash    Had penicillin shot w/o reaction 2018   Social History   Socioeconomic History  . Marital status: Married    Spouse name: Not on file  . Number of children: Not on file  . Years of education: Not on file  . Highest education level: Not on file  Occupational History  . Not on file  Tobacco Use  . Smoking status: Former Smoker    Packs/day: 1.50    Years: 25.00    Pack years: 37.50    Types: Cigarettes    Quit date: 12/08/1998    Years since quitting: 21.8  . Smokeless tobacco: Never Used  Vaping Use  . Vaping Use: Never used  Substance and Sexual Activity  . Alcohol use: Yes    Alcohol/week: 1.0 standard drink    Types: 1 Glasses of wine per week    Comment: RARE - a sip, occassional  . Drug use: No  . Sexual activity: Not Currently  Other Topics Concern  . Not on file  Social History Narrative  . Not on file   Social Determinants of Health   Financial Resource Strain: Not on file  Food Insecurity: Not on file  Transportation Needs: Not on file  Physical Activity: Not on file  Stress: Not on file  Social Connections: Not on file  Intimate Partner Violence: Not on file      Review of Systems  All other systems reviewed and are  negative.      Objective:   Physical Exam Vitals reviewed.  Constitutional:      General: He is not in acute distress.    Appearance: Normal appearance. He is well-developed and normal weight. He is not ill-appearing, toxic-appearing or diaphoretic.  HENT:     Head: Normocephalic and atraumatic.     Nose: Nose normal.     Mouth/Throat:     Pharynx: No oropharyngeal exudate or posterior oropharyngeal erythema.  Eyes:  Conjunctiva/sclera: Conjunctivae normal.     Pupils: Pupils are equal, round, and reactive to light.  Cardiovascular:     Rate and Rhythm: Normal rate and regular rhythm.     Heart sounds: Normal heart sounds. No murmur heard. No friction rub. No gallop.   Pulmonary:     Effort: Pulmonary effort is normal. No respiratory distress.     Breath sounds: Normal breath sounds. No stridor. No wheezing, rhonchi or rales.  Chest:     Chest wall: No tenderness.  Abdominal:     General: Abdomen is flat. Bowel sounds are normal. There is no distension.     Palpations: Abdomen is soft.     Tenderness: There is no abdominal tenderness. There is no guarding.  Musculoskeletal:     Left shoulder: Tenderness present. Decreased range of motion. Decreased strength.     Right lower leg: No edema.     Left lower leg: No edema.  Lymphadenopathy:     Cervical: No cervical adenopathy.  Neurological:     General: No focal deficit present.     Mental Status: He is alert and oriented to person, place, and time.     Cranial Nerves: No cranial nerve deficit.     Sensory: No sensory deficit.     Motor: Tremor present. No weakness, atrophy, abnormal muscle tone, seizure activity or pronator drift.     Coordination: Coordination normal.     Gait: Gait normal.     Deep Tendon Reflexes: Reflexes normal.            Assessment & Plan:  Chronic fatigue - Plan: CBC with Differential/Platelet, COMPLETE METABOLIC PANEL WITH GFR, TSH, Sedimentation rate, Hemoglobin A1c, Vitamin B12,  Testosterone Total,Free,Bio, Males  Type 2 diabetes mellitus with diabetic neuropathy, without long-term current use of insulin (HCC) - Plan: Hemoglobin A1c  Patient's physical exam today is normal.  There is the possibility that he is a 75 year old who recently had 2 major events and has had a prolonged recovery including surgery and a kidney stone.  However I believe that we need to institute a diagnostic work-up just to rule out other possibilities.  I will check a CBC to evaluate for any evidence of bone marrow abnormality such as leukemia.  Also follow-up with hemoglobin to rule out anemia as given his postoperative bleeding.  Check a CMP to evaluate for any liver dysfunction or kidney damage.  Check a TSH to rule out hypothyroidism.  Check a B12 to rule out B12 deficiency.  Check a testosterone level to rule out hypogonadism.  Check a sedimentation rate to look for signs of autoimmune diseases or potential hidden malignancies.  Also check an A1c to evaluate for uncontrolled diabetes.  Await the results of his lab work.  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  Other than the tremor he shows no other signs or symptoms of Parkinson's disease.  Therefore we will monitor it at the present time

## 2020-10-18 LAB — CBC WITH DIFFERENTIAL/PLATELET
Absolute Monocytes: 680 cells/uL (ref 200–950)
Basophils Absolute: 32 cells/uL (ref 0–200)
Basophils Relative: 0.4 %
Eosinophils Absolute: 136 cells/uL (ref 15–500)
Eosinophils Relative: 1.7 %
HCT: 42.8 % (ref 38.5–50.0)
Hemoglobin: 14 g/dL (ref 13.2–17.1)
Lymphs Abs: 1968 cells/uL (ref 850–3900)
MCH: 30.7 pg (ref 27.0–33.0)
MCHC: 32.7 g/dL (ref 32.0–36.0)
MCV: 93.9 fL (ref 80.0–100.0)
MPV: 10.8 fL (ref 7.5–12.5)
Monocytes Relative: 8.5 %
Neutro Abs: 5184 cells/uL (ref 1500–7800)
Neutrophils Relative %: 64.8 %
Platelets: 312 10*3/uL (ref 140–400)
RBC: 4.56 10*6/uL (ref 4.20–5.80)
RDW: 12.7 % (ref 11.0–15.0)
Total Lymphocyte: 24.6 %
WBC: 8 10*3/uL (ref 3.8–10.8)

## 2020-10-18 LAB — TESTOSTERONE TOTAL,FREE,BIO, MALES
Albumin: 4 g/dL (ref 3.6–5.1)
Sex Hormone Binding: 32 nmol/L (ref 22–77)
Testosterone, Bioavailable: 106.2 ng/dL (ref 15.0–150.0)
Testosterone, Free: 57.7 pg/mL (ref 6.0–73.0)
Testosterone: 414 ng/dL (ref 250–827)

## 2020-10-18 LAB — COMPLETE METABOLIC PANEL WITH GFR
AG Ratio: 1.8 (calc) (ref 1.0–2.5)
ALT: 17 U/L (ref 9–46)
AST: 16 U/L (ref 10–35)
Albumin: 4 g/dL (ref 3.6–5.1)
Alkaline phosphatase (APISO): 67 U/L (ref 35–144)
BUN/Creatinine Ratio: 18 (calc) (ref 6–22)
BUN: 26 mg/dL — ABNORMAL HIGH (ref 7–25)
CO2: 27 mmol/L (ref 20–32)
Calcium: 9.5 mg/dL (ref 8.6–10.3)
Chloride: 106 mmol/L (ref 98–110)
Creat: 1.48 mg/dL — ABNORMAL HIGH (ref 0.70–1.18)
GFR, Est African American: 54 mL/min/{1.73_m2} — ABNORMAL LOW (ref >=60)
GFR, Est Non African American: 46 mL/min/{1.73_m2} — ABNORMAL LOW (ref >=60)
Globulin: 2.2 g/dL (calc) (ref 1.9–3.7)
Glucose, Bld: 115 mg/dL — ABNORMAL HIGH (ref 65–99)
Potassium: 4.5 mmol/L (ref 3.5–5.3)
Sodium: 141 mmol/L (ref 135–146)
Total Bilirubin: 0.7 mg/dL (ref 0.2–1.2)
Total Protein: 6.2 g/dL (ref 6.1–8.1)

## 2020-10-18 LAB — SEDIMENTATION RATE: Sed Rate: 11 mm/h (ref 0–20)

## 2020-10-18 LAB — HEMOGLOBIN A1C
Hgb A1c MFr Bld: 6.2 % of total Hgb — ABNORMAL HIGH (ref <5.7)
Mean Plasma Glucose: 131 mg/dL
eAG (mmol/L): 7.3 mmol/L

## 2020-10-18 LAB — VITAMIN B12: Vitamin B-12: 452 pg/mL (ref 200–1100)

## 2020-10-18 LAB — TSH: TSH: 1.69 mIU/L (ref 0.40–4.50)

## 2020-10-20 ENCOUNTER — Other Ambulatory Visit: Payer: Self-pay | Admitting: Family Medicine

## 2020-10-20 DIAGNOSIS — N289 Disorder of kidney and ureter, unspecified: Secondary | ICD-10-CM

## 2020-10-21 ENCOUNTER — Other Ambulatory Visit: Payer: Self-pay

## 2020-10-21 DIAGNOSIS — N289 Disorder of kidney and ureter, unspecified: Secondary | ICD-10-CM

## 2020-10-22 ENCOUNTER — Ambulatory Visit
Admission: RE | Admit: 2020-10-22 | Discharge: 2020-10-22 | Disposition: A | Discharge status: Home / Self Care | Payer: Medicare HMO | Source: Ambulatory Visit | Attending: Family Medicine | Admitting: Family Medicine

## 2020-10-22 DIAGNOSIS — N289 Disorder of kidney and ureter, unspecified: Secondary | ICD-10-CM

## 2020-10-22 DIAGNOSIS — N281 Cyst of kidney, acquired: Secondary | ICD-10-CM | POA: Diagnosis not present

## 2020-11-12 DIAGNOSIS — N400 Enlarged prostate without lower urinary tract symptoms: Secondary | ICD-10-CM | POA: Diagnosis not present

## 2020-11-12 DIAGNOSIS — R351 Nocturia: Secondary | ICD-10-CM | POA: Diagnosis not present

## 2020-11-12 DIAGNOSIS — N201 Calculus of ureter: Secondary | ICD-10-CM | POA: Diagnosis not present

## 2020-11-24 ENCOUNTER — Encounter: Payer: Self-pay | Admitting: Family Medicine

## 2020-11-24 ENCOUNTER — Other Ambulatory Visit: Payer: Self-pay

## 2020-11-24 ENCOUNTER — Ambulatory Visit (INDEPENDENT_AMBULATORY_CARE_PROVIDER_SITE_OTHER): Payer: Medicare HMO | Admitting: Family Medicine

## 2020-11-24 VITALS — BP 124/68 | HR 61 | Temp 97.5°F | Ht 71.0 in | Wt 218.0 lb

## 2020-11-24 DIAGNOSIS — G8929 Other chronic pain: Secondary | ICD-10-CM

## 2020-11-24 DIAGNOSIS — M25512 Pain in left shoulder: Secondary | ICD-10-CM | POA: Diagnosis not present

## 2020-11-24 NOTE — Progress Notes (Signed)
Subjective:    Patient ID: Jimmy Bradshaw, male    DOB: 08-16-1946, 75 y.o.   MRN: [id number]  HPI  He presents today reporting pain in the left shoulder with abduction and pain with range of motion.  He is again requesting a cortisone injection in the left shoulder.  He denies any new falls or injuries.  He denies any fevers or chills.  He does report pain that keeps him awake at night and pain with activities of daily living. Past Medical History:  Diagnosis Date  . Anxiety   . Arthritis    HANDS,  SHOULDERS  . Bladder neck obstruction   . BPH (benign prostatic hypertrophy)   . Frequency of urination   . H/O: gout   . History of colon polyps   . Hyperlipidemia   . Nocturia   . PAF (paroxysmal atrial fibrillation) ([place])   . Type 2 diabetes mellitus (HCC)    diet controlled  . Urgency of urination   . Wears hearing aid    BILATERAL   Past Surgical History:  Procedure Laterality Date  . CARDIOVERSION N/A 10/26/2016   Procedure: CARDIOVERSION;  Surgeon: Larey Dresser, MD;  Location: [place];  Service: Cardiovascular;  Laterality: N/A;  . CARDIOVERSION N/A 04/17/2019   Procedure: CARDIOVERSION;  Surgeon: Sueanne Margarita, MD;  Location: [facility];  Service: Cardiovascular;  Laterality: N/A;  . COLONOSCOPY  08-30-2011, 2017  . I & D KNEE WITH POLY EXCHANGE Left 10/08/2014   Procedure: IRRIGATION AND DEBRIDEMENT LEFT UNI KNEE ARTHOPLASTY  WITH POLY EXCHANGE;  Surgeon: Mauri Pole, MD;  Location: [facility];  Service: Orthopedics;  Laterality: Left;  . KNEE ARTHROSCOPY Left 11/ 2013  . PARTIAL KNEE ARTHROPLASTY Left 02/05/2013   Procedure: LEFT KNEE MEDIAL UNICOMPARTMENTAL KNEE;  Surgeon: Mauri Pole, MD;  Location: [facility];  Service: Orthopedics;  Laterality: Left;  . PICC LINE INSERTION     and removed later  . TRANSURETHRAL RESECTION OF PROSTATE N/A 09/30/2014   Procedure: TRANSURETHRAL RESECTION OF THE PROSTATE WITH GYRUS INSTRUMENTS;  Surgeon: Bernestine Amass, MD;   Location: [facility];  Service: Urology;  Laterality: N/A;  . TRANSURETHRAL RESECTION OF PROSTATE N/A 07/03/2020   Procedure: TRANSURETHRAL RESECTION OF THE PROSTATE (TURP);  Surgeon: Franchot Gallo, MD;  Location: [facility];  Service: Urology;  Laterality: N/A;  1 HR   Current Outpatient Medications on File Prior to Visit  Medication Sig Dispense Refill  . allopurinol (ZYLOPRIM) 100 MG tablet Take 1 tablet (100 mg total) by mouth daily. 90 tablet 1  . allopurinol (ZYLOPRIM) 300 MG tablet TAKE 1 TABLET EVERY DAY 90 tablet 1  . apixaban (ELIQUIS) 5 MG TABS tablet Take 1 tablet (5 mg total) by mouth 2 (two) times daily. 60 tablet 11  . buPROPion (WELLBUTRIN XL) 300 MG 24 hr tablet TAKE 1 TABLET EVERY DAY 90 tablet 1  . flecainide (TAMBOCOR) 50 MG tablet Take 1 tablet (50 mg total) by mouth 2 (two) times daily. Please make yearly appt with Dr. Acie Fredrickson for April 2022 for future refills. Thank you 1st attempt 180 tablet 0  . gabapentin (NEURONTIN) 100 MG capsule TAKE ONE TO THREE CAPSULES AT BEDTIME FOR NERVE PAIN AS NEEDED 90 capsule 3  . lisinopril (ZESTRIL) 2.5 MG tablet TAKE 1 TABLET EVERY DAY 90 tablet 3  . metoprolol succinate (TOPROL-XL) 25 MG 24 hr tablet Take 1 tablet (25 mg total) by mouth daily. 90 tablet 2  .  MITIGARE 0.6 MG CAPS Take 1 cap (0.6 mg total) by mouth daily as needed. May repeat in 2 hours 30 capsule 3  . rosuvastatin (CRESTOR) 10 MG tablet TAKE 1 TABLET EVERY DAY (Patient taking differently: at bedtime.) 90 tablet 3  . sulfamethoxazole-trimethoprim (BACTRIM DS) 800-160 MG tablet Take 1 tablet by mouth every 12 (twelve) hours. 6 tablet 0   No current facility-administered medications on file prior to visit.   Allergies  Allergen Reactions  . Pravastatin Other (See Comments)    Headache and legs hurt  . Penicillins Rash    Had penicillin shot w/o reaction 2018   Social History   Socioeconomic History  . Marital status: Married     Spouse name: Not on file  . Number of children: Not on file  . Years of education: Not on file  . Highest education level: Not on file  Occupational History  . Not on file  Tobacco Use  . Smoking status: Former Smoker    Packs/day: 1.50    Years: 25.00    Pack years: 37.50    Types: Cigarettes    Quit date: 12/08/1998    Years since quitting: 21.9  . Smokeless tobacco: Never Used  Vaping Use  . Vaping Use: Never used  Substance and Sexual Activity  . Alcohol use: Yes    Alcohol/week: 1.0 standard drink    Types: 1 Glasses of wine per week    Comment: RARE - a sip, occassional  . Drug use: No  . Sexual activity: Not Currently  Other Topics Concern  . Not on file  Social History Narrative  . Not on file   Social Determinants of Health   Financial Resource Strain: Not on file  Food Insecurity: Not on file  Transportation Needs: Not on file  Physical Activity: Not on file  Stress: Not on file  Social Connections: Not on file  Intimate Partner Violence: Not on file      Review of Systems  All other systems reviewed and are negative.      Objective:   Physical Exam Vitals reviewed.  Constitutional:      Appearance: He is well-developed.  Cardiovascular:     Rate and Rhythm: Normal rate and regular rhythm.     Heart sounds: Normal heart sounds.  Pulmonary:     Effort: Pulmonary effort is normal. No respiratory distress.     Breath sounds: Normal breath sounds. No wheezing or rales.  Musculoskeletal:     Left shoulder: Tenderness present. Decreased range of motion. Decreased strength.            Assessment & Plan:  Chronic left shoulder pain    Using sterile technique, I injected the patient's left shoulder with a mixture of 2 cc lidocaine, 2 cc Marcaine, and 2 cc of 40 mg/mL Kenalog.  The patient tolerated the procedure well without complication.

## 2020-11-25 ENCOUNTER — Other Ambulatory Visit: Payer: Self-pay | Admitting: Cardiovascular Disease

## 2020-11-25 ENCOUNTER — Encounter: Payer: Self-pay | Admitting: Family Medicine

## 2020-11-25 MED ORDER — ALLOPURINOL 100 MG PO TABS
100.0000 mg | ORAL_TABLET | Freq: Every day | ORAL | 1 refills | Status: DC
Start: 2020-11-25 — End: 2021-03-27

## 2020-11-25 NOTE — Telephone Encounter (Signed)
Eliquis 5mg  refill request received. Patient is 75 years old, weight-98.9kg, Crea-1.48 on 10/17/20, Diagnosis-Afib, and last seen by Dr. Acie Fredrickson on 01/03/2020. Dose is appropriate based on dosing criteria. Will send in refill to requested pharmacy.

## 2020-11-27 ENCOUNTER — Ambulatory Visit: Payer: Medicare HMO | Admitting: Podiatry

## 2020-11-27 ENCOUNTER — Encounter: Payer: Self-pay | Admitting: Podiatry

## 2020-11-27 ENCOUNTER — Other Ambulatory Visit: Payer: Self-pay

## 2020-11-27 ENCOUNTER — Ambulatory Visit (INDEPENDENT_AMBULATORY_CARE_PROVIDER_SITE_OTHER): Payer: Medicare HMO

## 2020-11-27 DIAGNOSIS — M722 Plantar fascial fibromatosis: Secondary | ICD-10-CM | POA: Diagnosis not present

## 2020-11-27 NOTE — Progress Notes (Signed)
Subjective:   Patient ID: Jimmy Bradshaw, male   DOB: 75 y.o.   MRN: [id number]   HPI Patient states he has developed pain in the bottom of his right heel now states that it was bothering him for 2 months but all of a sudden it disappeared but he still wants to have it checked   ROS      Objective:  Physical Exam  Neurovascular status intact with patient found to have prominent bone structure plantar aspect right heel that most likely has been bruised and sore secondary to trauma pressure with some improvement recently     Assessment:  The probability that this is related more to bone irritation versus inflammatory fasciitis condition     Plan:  H&P reviewed condition and we discussed different treatment options.  We will get a hold off further steroidal treatment he will utilize topical medicines stretching exercises and cushion shoes with heel lift.  He may require other treatments which I reviewed with him and patient will be seen back as needed

## 2021-01-04 ENCOUNTER — Encounter: Payer: Self-pay | Admitting: Cardiovascular Disease

## 2021-01-04 NOTE — Progress Notes (Signed)
This encounter was created in error - please disregard.

## 2021-01-05 ENCOUNTER — Encounter: Payer: Medicare HMO | Admitting: Cardiovascular Disease

## 2021-01-11 ENCOUNTER — Encounter: Payer: Self-pay | Admitting: Podiatry

## 2021-01-12 ENCOUNTER — Other Ambulatory Visit: Payer: Self-pay

## 2021-01-12 ENCOUNTER — Ambulatory Visit: Payer: Medicare HMO | Admitting: Podiatry

## 2021-01-12 ENCOUNTER — Ambulatory Visit (INDEPENDENT_AMBULATORY_CARE_PROVIDER_SITE_OTHER): Payer: Medicare HMO

## 2021-01-12 DIAGNOSIS — S99921A Unspecified injury of right foot, initial encounter: Secondary | ICD-10-CM

## 2021-01-12 DIAGNOSIS — S93691A Other sprain of right foot, initial encounter: Secondary | ICD-10-CM

## 2021-01-12 DIAGNOSIS — M722 Plantar fascial fibromatosis: Secondary | ICD-10-CM | POA: Diagnosis not present

## 2021-01-12 NOTE — Patient Instructions (Addendum)
Walking Boot, Adult  A walking boot holds your foot or ankle in place after an injury or a medical procedure. This helps with healing and prevents further injury. It has a hard, rigid outer frame that limits movement and supports your foot and leg. The inner lining is a layer of padded material. Walking boots also have adjustable straps to secure them over the foot and leg. A walking boot may be prescribed if you can put weight (bear weight) on your injured foot. How much you can walk while wearing the boot will depend on the type and severity of your injury. How to put on a walking boot There are different types of walking boots. Each type has specific instructions about how to wear it properly. Follow instructions from your health care provider, such as:  Ask someone to help you put on the boot, if needed.  Sit to put on your boot. Doing this is more comfortable and helps to prevent falls.  Open up the boot fully. Place your foot into the boot so your heel rests against the back.  Your toes should be supported by the base of the boot. They should not hang over the front edge.  Adjust the straps so the boot fits securely but is not too tight.  Do not bend the hard frame of the boot to get a good fit. How to walk with a walking boot How much you can walk will depend on your injury. Some tips for managing with a boot include:  Do not try to walk without wearing the boot unless your health care provider approves.  Use other assistive walking devices, including crutches or canes, as told by your health care provider.  On your uninjured foot, wear a shoe with a heel that is close to the height of the walking boot.  Be careful when walking on surfaces that are uneven or wet. How to reduce swelling while using a walking boot  Rest your injured foot or leg as much as possible.  If directed, put ice on the injured area. To do this: ? Put ice in a plastic bag. ? Place a towel between your  skin and the bag. ? Leave the ice on for 20 minutes, 2-3 times a day. ? Remove the ice if your skin turns bright red. This is very important. If you cannot feel pain, heat, or cold, you have a greater risk of damage to the area.  Keep your injured foot or leg raised (elevated) above the level of your heart for at least 2?3 hours each day or as told by your health care provider.  If swelling gets worse, loosen the boot. Rest and elevate your foot and leg.   How to care for your skin and foot while using a walking boot  Wear a long sock to protect your foot and leg from rubbing inside the boot.  Take off the boot one time each day to check the injured area. Check your foot, the surrounding skin, and your leg to make sure there are no sores, rashes, swelling, or wounds. The skin should be a healthy color, not pale or blue.  Try to notice if your walking pattern (gait) in the boot is fairly normal and that you are walking without a noticeable limp.  Follow instructions from your health care provider about taking care of your incision or wound, if this applies.  Clean and wash the injured area as told by your health care provider.  Gently  dry your foot and leg before putting the boot back on. Removing your walking boot Follow directions from your health care provider for removing the walking boot. Generally, it is okay to remove your walking boot:  When you are resting or sleeping.  To clean your foot and leg. How to keep the walking boot clean  Do not put any part of the boot in a washing machine or dryer.  Do not use chemical cleaning products. These could irritate your skin, especially if you have a wound or an incision.  Do not soak the liner of the boot.  Use a washcloth with mild soap and water to clean the frame and the liner of the boot by hand.  Allow the boot to air-dry completely before you put it back on your foot. Follow these instructions at home: Activity Your  activity will be restricted depending on the type and severity of your injury. Follow instructions from your health care provider. Also:  Bathe and shower as told by your health care provider.  Do not do any activities that could make your injury worse.  Do not drive if your affected foot is the one that you use for driving. Contact a health care provider if:  The boot is cracked or damaged.  The boot does not fit properly.  Your foot or leg hurts.  You have a rash, sore, or open sore (ulcer) on your foot or leg.  The skin on your foot or leg is pale.  You have a wound or incision on the foot and it is getting worse.  Your skin becomes painful, red, or irritated.  Your swelling does not get better or it gets worse. Get help right away if:  You have numbness in your foot or leg.  The skin on your foot or leg is cold, blue, or gray. Summary  A walking boot holds your foot or ankle in place after an injury or a medical procedure.  There are different types of walking boots. Follow instructions about how to correctly wear your boot.  Ask someone to help you put on the boot, if needed.  It is important to check your skin and foot every day. Call your health care provider if you notice a rash or sore on your foot or leg. This information is not intended to replace advice given to you by your health care provider. Make sure you discuss any questions you have with your health care provider. Document Revised: 06/30/2020 Document Reviewed: 06/30/2020 Elsevier Patient Education  2021 [employer].

## 2021-01-14 ENCOUNTER — Other Ambulatory Visit: Payer: Self-pay | Admitting: Cardiovascular Disease

## 2021-01-16 NOTE — Progress Notes (Signed)
Subjective: 75 year old male presents the office today for concerns of heel pain on the right side.  He states he has been treated for plantar fasciitis and thought it was getting better.  He states that he did make a sharp turn and at that time he felt and heard a pop in the heel.  This is on the bottom.  No some minimal swelling.  No recent treatment since the injury.  He has no other concerns. Denies any systemic complaints such as fevers, chills, nausea, vomiting. No acute changes since last appointment, and no other complaints at this time.   Objective: AAO x3, NAD DP/PT pulses palpable bilaterally, CRT less than 3 seconds There is tenderness on plantar aspect of the calcaneus at insertion of plantar fashion there is localized edema to this area.  There is no pain with lateral compression of calcaneus.  There is no pain with Achilles tendon.  Achilles tendon appears to be intact.  No area of pinpoint tenderness.  No pain with calf compression, swelling, warmth, erythema  Assessment: 75 year old male with likely plantar fascial tear right side  Plan: -All treatment options discussed with the patient including all alternatives, risks, complications.  -X-rays obtained reviewed.  No evidence of acute fracture. -Given his symptoms recommend immobilization in a cam boot which was dispensed today. -MRI -Ice -Limit activity -Patient encouraged to call the office with any questions, concerns, change in symptoms.   Trula Slade DPM

## 2021-01-25 ENCOUNTER — Ambulatory Visit
Admission: RE | Admit: 2021-01-25 | Discharge: 2021-01-25 | Disposition: A | Discharge status: Home / Self Care | Payer: Medicare HMO | Source: Ambulatory Visit | Attending: Podiatry | Admitting: Podiatry

## 2021-01-25 ENCOUNTER — Other Ambulatory Visit: Payer: Self-pay

## 2021-01-25 DIAGNOSIS — M65871 Other synovitis and tenosynovitis, right ankle and foot: Secondary | ICD-10-CM | POA: Diagnosis not present

## 2021-01-25 DIAGNOSIS — R600 Localized edema: Secondary | ICD-10-CM | POA: Diagnosis not present

## 2021-01-25 DIAGNOSIS — S93691A Other sprain of right foot, initial encounter: Secondary | ICD-10-CM

## 2021-01-25 DIAGNOSIS — M722 Plantar fascial fibromatosis: Secondary | ICD-10-CM | POA: Diagnosis not present

## 2021-01-25 DIAGNOSIS — M19071 Primary osteoarthritis, right ankle and foot: Secondary | ICD-10-CM | POA: Diagnosis not present

## 2021-01-28 ENCOUNTER — Other Ambulatory Visit: Payer: Medicare HMO

## 2021-02-02 ENCOUNTER — Encounter: Payer: Self-pay | Admitting: Podiatry

## 2021-02-02 ENCOUNTER — Ambulatory Visit: Payer: Medicare HMO | Admitting: Podiatry

## 2021-02-02 ENCOUNTER — Other Ambulatory Visit: Payer: Self-pay

## 2021-02-02 DIAGNOSIS — S93691D Other sprain of right foot, subsequent encounter: Secondary | ICD-10-CM | POA: Diagnosis not present

## 2021-02-04 NOTE — Progress Notes (Signed)
Subjective: 75 year old male presents the office today for evaluation of injury to the right heel and discussed MRI results.  Since I last saw him he has been doing laser therapy at a PT and this has been helping.  He is continue the cam boot as well.  Overall he states he is feeling much better.  Still some pain in the bottom of the heel.  He is asking other treatment options including PRP.  Denies any systemic complaints such as fevers, chills, nausea, vomiting. No acute changes since last appointment, and no other complaints at this time.   Objective: AAO x3, NAD DP/PT pulses palpable bilaterally, CRT less than 3 seconds Majority tenderness is still localized to the plantar aspect the calcaneus at the insertion of plantar fascial, muscular in the bottom of the heel.  No pain with lateral compression of calcaneus.  No pain in the Achilles tendon.  Minimal edema.  There is no erythema or warmth.  The edema appears to be improved compared to last appointment.  No pain with calf compression, swelling, warmth, erythema  Assessment: Plantar fascial tear  Plan: -All treatment options discussed with the patient including all alternatives, risks, complications.  -Overall he is doing better.  At this point want to continue the cam boot but discussed that he is feeling better with light rehab exercises.  He feels better he can transition to regular shoe as tolerated.  We can consider going to formal physical therapy.  Also discussed other treatment options including EPAT and PRP.  He can consider his options and let me know. -Patient encouraged to call the office with any questions, concerns, change in symptoms.   Trula Slade DPM

## 2021-02-08 ENCOUNTER — Other Ambulatory Visit: Payer: Self-pay | Admitting: Cardiovascular Disease

## 2021-02-09 ENCOUNTER — Other Ambulatory Visit: Payer: Self-pay | Admitting: Pharmacist

## 2021-02-09 MED ORDER — ELIQUIS 5 MG PO TABS
5.0000 mg | ORAL_TABLET | Freq: Two times a day (BID) | ORAL | 0 refills | Status: DC
Start: 2021-02-09 — End: 2021-08-10

## 2021-02-20 ENCOUNTER — Telehealth: Payer: Self-pay | Admitting: *Deleted

## 2021-02-20 NOTE — Telephone Encounter (Signed)
Tried to call the patient and the answering service the patient has stated that they could not come to the phone and I was going to let the patient know the cost of the Platelet Rich Plasma is $350.00 one time injection to start. Jimmy Bradshaw

## 2021-03-05 ENCOUNTER — Other Ambulatory Visit: Payer: Self-pay | Admitting: Family Medicine

## 2021-03-05 ENCOUNTER — Encounter: Payer: Self-pay | Admitting: Family Medicine

## 2021-03-05 ENCOUNTER — Telehealth: Payer: Self-pay | Admitting: Family Medicine

## 2021-03-05 MED ORDER — BUPROPION HCL ER (XL) 300 MG PO TB24
1.0000 | ORAL_TABLET | Freq: Every day | ORAL | 3 refills | Status: DC
Start: 2021-03-05 — End: 2022-04-20

## 2021-03-05 NOTE — Telephone Encounter (Signed)
Of note, no refill requests have been sent for this medication.

## 2021-03-05 NOTE — Telephone Encounter (Signed)
Pt came in very upset that his prescription for buPROPion (WELLBUTRIN XL) 300 MG had been denied. Looked into the pt's chart to see if there were any notations about this med. No notes were in the pt's chart about why this refill was denied, explained to pt that it may be possible  that he may need to schedule an appt with dr for refill. Pt got extremely upset and said that he would look for a new pcp, since he has changed to a new dr everything has changed. Pt stated that he has never had any problem with his refills before. Pt stated to tell dr that he didn't want the med from here, and that he would be looking for a new provider. Please advise

## 2021-03-19 ENCOUNTER — Encounter: Payer: Self-pay | Admitting: Cardiovascular Disease

## 2021-03-19 NOTE — Progress Notes (Signed)
    No show, Please disregard   This encounter was created in error - please disregard.

## 2021-03-20 ENCOUNTER — Encounter: Payer: Medicare HMO | Admitting: Cardiovascular Disease

## 2021-03-27 ENCOUNTER — Other Ambulatory Visit: Payer: Self-pay | Admitting: *Deleted

## 2021-03-27 MED ORDER — ALLOPURINOL 300 MG PO TABS: 300.0000 mg | ORAL_TABLET | Freq: Every day | ORAL | 1 refills | Status: DC

## 2021-03-27 MED ORDER — ALLOPURINOL 100 MG PO TABS: 100.0000 mg | ORAL_TABLET | Freq: Every day | ORAL | 1 refills | Status: DC

## 2021-04-10 ENCOUNTER — Other Ambulatory Visit: Payer: Self-pay | Admitting: *Deleted

## 2021-04-10 MED ORDER — ROSUVASTATIN CALCIUM 10 MG PO TABS: 10.0000 mg | ORAL_TABLET | Freq: Every day | ORAL | 3 refills | Status: DC

## 2021-04-22 ENCOUNTER — Encounter: Payer: Self-pay | Admitting: Cardiovascular Disease

## 2021-04-22 ENCOUNTER — Other Ambulatory Visit: Payer: Self-pay

## 2021-04-22 ENCOUNTER — Ambulatory Visit: Payer: Medicare HMO | Admitting: Cardiovascular Disease

## 2021-04-22 VITALS — BP 122/66 | HR 72 | Ht 71.0 in | Wt 220.4 lb

## 2021-04-22 DIAGNOSIS — I483 Typical atrial flutter: Secondary | ICD-10-CM | POA: Diagnosis not present

## 2021-04-22 DIAGNOSIS — I480 Paroxysmal atrial fibrillation: Secondary | ICD-10-CM | POA: Diagnosis not present

## 2021-04-22 DIAGNOSIS — I10 Essential (primary) hypertension: Secondary | ICD-10-CM

## 2021-04-22 DIAGNOSIS — I4892 Unspecified atrial flutter: Secondary | ICD-10-CM | POA: Diagnosis not present

## 2021-04-22 NOTE — Patient Instructions (Signed)
Medication Instructions:  Your physician recommends that you continue on your current medications as directed. Please refer to the Current Medication list given to you today.  *If you need a refill on your cardiac medications before your next appointment, please call your pharmacy*   Lab Work: None Ordered If you have labs (blood work) drawn today and your tests are completely normal, you will receive your results only by: [place] (if you have MyChart) OR A paper copy in the mail If you have any lab test that is abnormal or we need to change your treatment, we will call you to review the results.   Testing/Procedures: None Ordered   Follow-Up: At [facility], you and your health needs are our priority.  As part of our continuing mission to provide you with exceptional heart care, we have created designated Provider Care Teams.  These Care Teams include your primary Cardiologist (physician) and Advanced Practice Providers (APPs -  Physician Assistants and Nurse Practitioners) who all work together to provide you with the care you need, when you need it.  You have been referred to Electrophysiology (EP) cardiologists   Your next appointment:   1 year(s)  The format for your next appointment:   In Person  Provider:   You may see Mertie Moores, MD or one of the following Advanced Practice Providers on your designated Care Team:   Richardson Dopp, PA-C [place], [place]

## 2021-04-22 NOTE — Progress Notes (Signed)
Patient ID: Jimmy Bradshaw, male   DOB: [birth year], 75 y.o.   MRN: [id number]    Date:  04/22/2021   ID:  Jimmy Bradshaw, DOB 1946-07-01, MRN [id number]  PCP:  Susy Frizzle, MD  Primary Cardiologist:  Twanda Stakes  Chief Complaint  Patient presents with   Atrial Fibrillation   Problem list: 1. Paroxysmal atrial fibrillation  - CHADS2VASC score of 3  ( age, HTN, DM)  2. Hypertension 3. Hyperlipidemia 4. Diabetes mellitus 5. BPH - s/p TURP 6. S/p left TKA  - with supsequent infection of his prosthesis requiring re-do surgery .     Jimmy Bradshaw is a 75 y.o. male history of hypertension, hyperlipidemia, type 2 diabetes mellitus, anxiety, BPH. On 09/30/2014 he underwent TURP procedure and then a week later underwent irrigation and debridement of the left knee.  PICC line was placed and he was started on antibiotics for 4 weeks.  One of these surgeries patient supposedly went into atrial fibrillation with a rapid ventricular response. There are no EKGs or telemetry strips which confirmed this nor is there any documentation in the notes; either in the discharge summaries or with anesthesia.   The patient currently denies nausea, vomiting, fever, chest pain, shortness of breath, orthopnea, dizziness, PND, cough, congestion, abdominal pain, hematochezia, melena, lower extremity edema, claudication.   Feb. 19, 2016   He was getting his echo today and went in to rapid afib.  Still asymptomatic.  Was added on to my schedule.   November 21, 2014:  Jimmy Bradshaw is seen back today for follow up of his paroxysmal atrial fib.    We started him on Xarelto . Unfortunately, he had some bleeding in his urine .  He had just had recent prostate surgery ( [name]) .   He held his Xarelto for the next week.  He is doing better.   March 11, 2015: Jimmy Bradshaw has a hx of paroxysmal atrial fib. Has Eliquis but has not started it yet  His hematura has resolved.  Checks his pulse and has not had recurrent atrial fib to his  knowledge   Doing well.  Is having some leg pain.    Thinks it may be due to the pravachol  Still having knee problems .    Jan. 17, 2017:  Jimmy Bradshaw is doing well He has paroxysmal afib  - is back in Afib today - cannot tell that his HR is irregular.  No CP , no dyspnea.  He is not having any further urinary bleeding . His knee infection has resolved.   Jan. 30, 2018:  Jimmy Bradshaw is seen for visit  Dr. Buelah Manis increased the Toprol XL to 50 mg a day   January 10, 2017:  Jimmy Bradshaw is seen today for follow up of his atrial fib.  He had a cardioversion Feb. 6, 2018: GXT  Feb. 13, 2018 showed mild widening of his QRS with peak exercise. We decreased his flecainide from 100 mg twice a day to 50 mg twice a day at that point. In on Flecainide 50 mg BID , metoprolol 25 mg a day and Eliquis  Feels well Left knee replacement is working well   Dec. 19, 2018:  No CP or dysnea.   Maintaining  NSR  Is watching his diet.   Walks with dogs.    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  Having some vision issues.   Saw Dr. Sherrilyn Rist ( at [facility]) Being referred to [facility] for [place] eval   March 20, 2018:  He was seen today for follow-up of his paroxysmal atrial fibrillation, HTN, HLD .  Marland Kitchen  He has maintained normal sinus rhythm.  He is on flecainide 50 mg twice a day. Has been having some issues with his vision.  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    Monitoring for now  No CP , no dyspnea,   Exercising  Running dogs in agility training .  Bearded collies.   Feb. 24, 2020:  Jimmy Bradshaw is seen today for follow-up of his hypertension, hyperlipidemia, paroxysmal atrial fibrillation.  He apparently has had some fluid retention recently and was started on Lasix. His last echocardiogram was in 2016 which revealed normal left ventricular systolic function with an ejection fraction of 55 to 60%.  We were not able to assess his diastolic function because of the presence of atrial fibrillation.  He started  having swelling in his feet.  Right > left  Started improving ( even before starting lasix )  Started lasix  Does not think he eats too much salt  Exercises regularly ,  - 3 days a week does agility training   No chest pain  Just DOE after running the dogs.   April 03, 2019: Jimmy Bradshaw is seen today for a follow-up visit.  He is back in atrial flutter today. He is completely asymptomatic.  He is been doing well.  He does his normal activities without any difficulty.  Blood pressure and heart rate are normal. Had 2 glasses of tea last night No Etoh recently  No missed dose of flecainide   July 05, 2019: Jimmy Bradshaw is seen today for follow-up of his atrial flutter. Patient has been successfully cardioverted again as of April 17, 2019. Feels well.     January 03, 2020 Jimmy Bradshaw is seen today for follow up of his atrial flutter and HTN Was last by Gae Bon last summer.  Has had both covid vaccines.   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  Has some neuropathy issues.   April 22, 2021: Jimmy Bradshaw is seen today Is in atrial flutter today .  He cannot tell if he is out of rhym  We discussed ablation    Wt Readings from Last 3 Encounters:  04/22/21 220 lb 6.4 oz (100 kg)  11/24/20 218 lb (98.9 kg)  10/17/20 214 lb (97.1 kg)     Past Medical History:  Diagnosis Date   Anxiety    Arthritis    HANDS,  SHOULDERS   Bladder neck obstruction    BPH (benign prostatic hypertrophy)    Frequency of urination    H/O: gout    History of colon polyps    Hyperlipidemia    Nocturia    PAF (paroxysmal atrial fibrillation) (HCC)    Type 2 diabetes mellitus (HCC)    diet controlled   Urgency of urination    Wears hearing aid    BILATERAL    Current Outpatient Medications  Medication Sig Dispense Refill   allopurinol (ZYLOPRIM) 100 MG tablet Take 1 tablet (100 mg total) by mouth daily. 90 tablet 1   allopurinol (ZYLOPRIM) 300 MG  tablet Take 1 tablet (300 mg total) by mouth daily. 90 tablet 1   apixaban (ELIQUIS) 5 MG TABS tablet Take 1 tablet (5 mg total) by mouth 2 (two) times daily. 180 tablet 0   buPROPion (WELLBUTRIN XL) 300  MG 24 hr tablet Take 1 tablet (300 mg total) by mouth daily. 90 tablet 3   flecainide (TAMBOCOR) 50 MG tablet Take 1 tablet (50 mg total) by mouth 2 (two) times daily. 180 tablet 3   lisinopril (ZESTRIL) 2.5 MG tablet TAKE 1 TABLET EVERY DAY 90 tablet 3   metoprolol succinate (TOPROL-XL) 25 MG 24 hr tablet Take 1 tablet (25 mg total) by mouth daily. 90 tablet 2   MITIGARE 0.6 MG CAPS Take 1 cap (0.6 mg total) by mouth daily as needed. May repeat in 2 hours 30 capsule 3   rosuvastatin (CRESTOR) 10 MG tablet Take 1 tablet (10 mg total) by mouth daily. 90 tablet 3   gabapentin (NEURONTIN) 100 MG capsule TAKE ONE TO THREE CAPSULES AT BEDTIME FOR NERVE PAIN AS NEEDED (Patient not taking: Reported on 04/22/2021) 90 capsule 3   oxyCODONE (OXY IR/ROXICODONE) 5 MG immediate release tablet Take 5 mg by mouth every 6 (six) hours as needed. (Patient not taking: Reported on 04/22/2021)     sulfamethoxazole-trimethoprim (BACTRIM DS) 800-160 MG tablet Take 1 tablet by mouth every 12 (twelve) hours. (Patient not taking: Reported on 04/22/2021) 6 tablet 0   No current facility-administered medications for this visit.    Allergies:    Allergies  Allergen Reactions   Pravastatin Other (See Comments)    Headache and legs hurt   Penicillins Rash    Had penicillin shot w/o reaction 2018    Social History:  The patient  reports that he quit smoking about 22 years ago. His smoking use included cigarettes. He has a 37.50 pack-year smoking history. He has never used smokeless tobacco. He reports current alcohol use of about 1.0 standard drink of alcohol per week. He reports that he does not use drugs.   Family history:   Family History  Adopted: Yes  Problem Relation Age of Onset   Colon cancer Father     ROS:   Please see the history of present illness.  All other systems reviewed and negative.   Physical Exam: Blood pressure 122/66, pulse 72, height '5\' 11"'$  (1.803 m), weight 220 lb 6.4 oz (100 kg), SpO2 98 %.  GEN:  Well nourished, well developed in no acute distress HEENT: Normal NECK: No JVD; No carotid bruits LYMPHATICS: No lymphadenopathy CARDIAC: RRR , no murmurs, rubs, gallops RESPIRATORY:  Clear to auscultation without rales, wheezing or rhonchi  ABDOMEN: Soft, non-tender, non-distended MUSCULOSKELETAL:  No edema; No deformity  SKIN: Warm and dry NEUROLOGIC:  Alert and oriented x 3   EKG:       ASSESSMENT AND PLAN:   1.    Paroxysmal Atrial flutter :  CHADS 2VASC score of 3 ( Age, HTN, DM) . He now seems to be in atrial flutter frequently  Will refer to EP to consider ablation  From my review I think he has only had atrial flutter.  I do not see evidence of atrial fibrillation.      3. Essential hypertension:    BP is well controlled.   4. Hyperlipidemia:      5.   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 Is followed by Annia Belt, MD and at [facility] ( Dr. Frederica Kuster)  .      Thayer Headings, [name], MD, [facility] 04/22/2021, 4:11 PM [address]  [place] Pager [phone]

## 2021-05-18 ENCOUNTER — Ambulatory Visit: Payer: Medicare HMO | Admitting: Cardiovascular Disease

## 2021-06-02 ENCOUNTER — Encounter: Payer: Self-pay | Admitting: Cardiology

## 2021-06-02 ENCOUNTER — Other Ambulatory Visit: Payer: Self-pay

## 2021-06-02 ENCOUNTER — Ambulatory Visit: Payer: Medicare HMO | Admitting: Cardiology

## 2021-06-02 VITALS — BP 118/70 | HR 57 | Ht 71.0 in | Wt 218.0 lb

## 2021-06-02 DIAGNOSIS — Z01818 Encounter for other preprocedural examination: Secondary | ICD-10-CM

## 2021-06-02 DIAGNOSIS — Z01812 Encounter for preprocedural laboratory examination: Secondary | ICD-10-CM

## 2021-06-02 DIAGNOSIS — I480 Paroxysmal atrial fibrillation: Secondary | ICD-10-CM

## 2021-06-02 DIAGNOSIS — I483 Typical atrial flutter: Secondary | ICD-10-CM

## 2021-06-02 NOTE — Patient Instructions (Addendum)
Medication Instructions:  Your physician recommends that you continue on your current medications as directed. Please refer to the Current Medication list given to you today.  *If you need a refill on your cardiac medications before your next appointment, please call your pharmacy*   Lab Work: Pre procedure labs :  see instruction letter below  If you have labs (blood work) drawn today and your tests are completely normal, you will receive your results only by: [place] (if you have MyChart) OR A paper copy in the mail If you have any lab test that is abnormal or we need to change your treatment, we will call you to review the results.   Testing/Procedures: Your physician has requested that you have cardiac CT within 7 days PRIOR to your ablation. Cardiac computed tomography (CT) is a painless test that uses an x-ray machine to take clear, detailed pictures of your heart.  Please follow instruction below located under "other instructions". You will get a call from our office to schedule the date for this test.  Your physician has recommended that you have an ablation. Catheter ablation is a medical procedure used to treat some cardiac arrhythmias (irregular heartbeats). During catheter ablation, a long, thin, flexible tube is put into a blood vessel in your groin (upper thigh), or neck. This tube is called an ablation catheter. It is then guided to your heart through the blood vessel. Radio frequency waves destroy small areas of heart tissue where abnormal heartbeats may cause an arrhythmia to start. Please follow instruction below located under "other instructions".   Follow-Up: At [facility], you and your health needs are our priority.  As part of our continuing mission to provide you with exceptional heart care, we have created designated Provider Care Teams.  These Care Teams include your primary Cardiologist (physician) and Advanced Practice Providers (APPs -  Physician  Assistants and Nurse Practitioners) who all work together to provide you with the care you need, when you need it.  Your next appointment:   1 month(s) after your ablation  The format for your next appointment:   In Person  Provider:   AFib clinic   Thank you for choosing [facility]!!   Trinidad Curet, RN [phone]    Other Instructions   CT INSTRUCTIONS Your cardiac CT will be scheduled at:  [facility] [address] [place], [place] [address] [phone]  Please arrive at the [facility] main entrance (entrance A) of [facility] 30 minutes prior to test start time. Proceed to the [facility] Radiology Department (first floor) to check-in and test prep.   Please follow these instructions carefully (unless otherwise directed):  Hold all erectile dysfunction medications at least 3 days (72 hrs) prior to test.  On the Night Before the Test: Be sure to Drink plenty of water. Do not consume any caffeinated/decaffeinated beverages or chocolate 12 hours prior to your test. Do not take any antihistamines 12 hours prior to your test.  On the Day of the Test: Drink plenty of water until 1 hour prior to the test. Do not eat any food 4 hours prior to the test. You may take your regular medications prior to the test.  HOLD Furosemide/Hydrochlorothiazide morning of the test.       After the Test: Drink plenty of water. After receiving IV contrast, you may experience a mild flushed feeling. This is normal. On occasion, you may experience a mild rash up to 24 hours after  the test. This is not dangerous. If this occurs, you can take Benadryl 25 mg and increase your fluid intake. If you experience trouble breathing, this can be serious. If it is severe call 911 IMMEDIATELY. If it is mild, please call our office. If you take any of these medications: Glipizide/Metformin, Avandament, Glucavance, please do not take 48 hours after completing test unless  otherwise instructed.   Once we have confirmed authorization from your insurance company, we will call you to set up a date and time for your test. Based on how quickly your insurance processes prior authorizations requests, please allow up to 4 weeks to be contacted for scheduling your Cardiac CT appointment. Be advised that routine Cardiac CT appointments could be scheduled as many as 8 weeks after your provider has ordered it.  For non-scheduling related questions, please contact the cardiac imaging nurse navigator should you have any questions/concerns: Marchia Bond, Cardiac Imaging Nurse Navigator Gordy Clement, Cardiac Imaging Nurse Navigator [facility] Direct Office Dial: [phone]   For scheduling needs, including cancellations and rescheduling, please call [place], [phone].     Electrophysiology/Ablation Procedure Instructions   You are scheduled for a(n)  ablation on 08/05/2021 with Dr. Allegra Lai.   1.   Pre procedure testing-             A.  LAB WORK --- On 07/20/2021 for your pre procedure blood work.  You do NOT need to be fasting.  You can stop by the [employer] office anytime between 7:30 am - 4:30 pm   On the day of your procedure 08/05/2021 you will go to [facility] hospital ([address]) at 6:30 am.  Dennis Bast will go to the main entrance A [employer]) and enter where the [employer] are.  Your driver will drop you off and you will head down the hallway to ADMITTING.  You may have one support person come in to the hospital with you.  They will be asked to wait in the waiting room. It is OK to have someone drop you off and come back when you are ready to be discharged.   3.   Do not eat or drink after midnight prior to your procedure.   4.   On the morning of your procedure do NOT take any medication. Do not miss any doses of your blood thinner prior to the morning of your procedure or your procedure will need to be  rescheduled.   5.  Plan for an overnight stay but you may be discharged after your procedure, if you use your phone frequently bring your phone charger. If you are discharged after your procedure you will need someone to drive you home and be with you for 24 hours after your procedure.   6. You will follow up with the AFIB clinic 4 weeks after your procedure.  You will follow up with Dr. Curt Bears  3 months after your procedure.  These appointments will be made for you.   7. FYI: For your safety, and to allow [place] to monitor your vital signs accurately during the surgery/procedure we request that if you have artificial nails, gel coating, SNS etc. Please have those removed prior to your surgery/procedure. Not having the nail coverings /polish removed may result in cancellation or delay of your surgery/procedure.  * If you have ANY questions please call the office [phone] and ask for Aran Menning RN or send me a MyChart message   *  Occasionally, EP Studies and ablations can become lengthy.  Please make your family aware of this before your procedure starts.  Average time ranges from 2-8 hours for EP studies/ablations.  Your physician will call your family after the procedure with the results.                                    Cardiac Ablation Cardiac ablation is a procedure to destroy (ablate) some heart tissue that is sending bad signals. These bad signals cause problems in heart rhythm. The heart has many areas that make these signals. If there are problems in these areas, they can make the heart beat in a way that is not normal. Destroying some tissues can help make the heart rhythm normal. Tell your doctor about: Any allergies you have. All medicines you are taking. These include vitamins, herbs, eye drops, creams, and over-the-counter medicines. Any problems you or family members have had with medicines that make you fall asleep (anesthetics). Any blood disorders you have. Any surgeries you  have had. Any medical conditions you have, such as kidney failure. Whether you are pregnant or may be pregnant. What are the risks? This is a safe procedure. But problems may occur, including: Infection. Bruising and bleeding. Bleeding into the chest. Stroke or blood clots. Damage to nearby areas of your body. Allergies to medicines or dyes. The need for a pacemaker if the normal system is damaged. Failure of the procedure to treat the problem. What happens before the procedure? Medicines Ask your doctor about: Changing or stopping your normal medicines. This is important. Taking aspirin and ibuprofen. Do not take these medicines unless your doctor tells you to take them. Taking other medicines, vitamins, herbs, and supplements. General instructions Follow instructions from your doctor about what you cannot eat or drink. Plan to have someone take you home from the hospital or clinic. If you will be going home right after the procedure, plan to have someone with you for 24 hours. Ask your doctor what steps will be taken to prevent infection. What happens during the procedure?  An IV tube will be put into one of your veins. You will be given a medicine to help you relax. The skin on your neck or groin will be numbed. A cut (incision) will be made in your neck or groin. A needle will be put through your cut and into a large vein. A tube (catheter) will be put into the needle. The tube will be moved to your heart. Dye may be put through the tube. This helps your doctor see your heart. Small devices (electrodes) on the tube will send out signals. A type of energy will be used to destroy some heart tissue. The tube will be taken out. Pressure will be held on your cut. This helps stop bleeding. A bandage will be put over your cut. The exact procedure may vary among doctors and hospitals. What happens after the procedure? You will be watched until you leave the hospital or clinic. This  includes checking your heart rate, breathing rate, oxygen, and blood pressure. Your cut will be watched for bleeding. You will need to lie still for a few hours. Do not drive for 24 hours or as long as your doctor tells you. Summary Cardiac ablation is a procedure to destroy some heart tissue. This is done to treat heart rhythm problems. Tell your doctor about any medical conditions  you may have. Tell him or her about all medicines you are taking to treat them. This is a safe procedure. But problems may occur. These include infection, bruising, bleeding, and damage to nearby areas of your body. Follow what your doctor tells you about food and drink. You may also be told to change or stop some of your medicines. After the procedure, do not drive for 24 hours or as long as your doctor tells you. This information is not intended to replace advice given to you by your health care provider. Make sure you discuss any questions you have with your health care provider. Document Revised: 08/09/2019 Document Reviewed: 08/09/2019 Elsevier Patient Education  2022 [employer].

## 2021-06-02 NOTE — Progress Notes (Signed)
Electrophysiology Office Note   Date:  06/02/2021   ID:  Jimmy Bradshaw, DOB 06-Dec-1945, MRN [id number]  PCP:  Susy Frizzle, MD  Cardiologist:  Nahser Primary Electrophysiologist:  Darlina Mccaughey Meredith Leeds, MD    Chief Complaint: atrial flutter   History of Present Illness: Jimmy Bradshaw is a 75 y.o. male who is being seen today for the evaluation of AF at the request of Nahser, Wonda Cheng, MD. Presenting today for electrophysiology evaluation.  He has a history significant for paroxysmal atrial flutter, hypertension, hyperlipidemia, diabetes.  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  He was seen in cardiology clinic and was noted to be in atrial flutter.  He was unaware of his arrhythmia.  In review of his ECGs, he does have evidence of atrial fibrillation by an ECG on 10/07/2015.  He would like to avoid medications and would prefer ablation.  Today, he denies symptoms of palpitations, chest pain, shortness of breath, orthopnea, PND, lower extremity edema, claudication, dizziness, presyncope, syncope, bleeding, or neurologic sequela. The patient is tolerating medications without difficulties.    Past Medical History:  Diagnosis Date   Anxiety    Arthritis    HANDS,  SHOULDERS   Bladder neck obstruction    BPH (benign prostatic hypertrophy)    Frequency of urination    H/O: gout    History of colon polyps    Hyperlipidemia    Nocturia    PAF (paroxysmal atrial fibrillation) ([place])    Type 2 diabetes mellitus ([place])    diet controlled   Urgency of urination    Wears hearing aid    BILATERAL   Past Surgical History:  Procedure Laterality Date   CARDIOVERSION N/A 10/26/2016   Procedure: CARDIOVERSION;  Surgeon: Larey Dresser, MD;  Location: [facility];  Service: Cardiovascular;  Laterality: N/A;   CARDIOVERSION N/A 04/17/2019   Procedure: CARDIOVERSION;  Surgeon: Sueanne Margarita, MD;  Location: [facility] ENDOSCOPY;  Service:  Cardiovascular;  Laterality: N/A;   COLONOSCOPY  08-30-2011, 2017   I & D KNEE WITH POLY EXCHANGE Left 10/08/2014   Procedure: IRRIGATION AND DEBRIDEMENT LEFT UNI KNEE ARTHOPLASTY  WITH POLY EXCHANGE;  Surgeon: Mauri Pole, MD;  Location: [facility] ORS;  Service: Orthopedics;  Laterality: Left;   KNEE ARTHROSCOPY Left 11/ 2013   PARTIAL KNEE ARTHROPLASTY Left 02/05/2013   Procedure: LEFT KNEE MEDIAL UNICOMPARTMENTAL KNEE;  Surgeon: Mauri Pole, MD;  Location: [facility] ORS;  Service: Orthopedics;  Laterality: Left;   PICC LINE INSERTION     and removed later   TRANSURETHRAL RESECTION OF PROSTATE N/A 09/30/2014   Procedure: TRANSURETHRAL RESECTION OF THE PROSTATE WITH GYRUS INSTRUMENTS;  Surgeon: Bernestine Amass, MD;  Location: [facility];  Service: Urology;  Laterality: N/A;   TRANSURETHRAL RESECTION OF PROSTATE N/A 07/03/2020   Procedure: TRANSURETHRAL RESECTION OF THE PROSTATE (TURP);  Surgeon: Franchot Gallo, MD;  Location: [facility];  Service: Urology;  Laterality: N/A;  1 HR     Current Outpatient Medications  Medication Sig Dispense Refill   allopurinol (ZYLOPRIM) 100 MG tablet Take 1 tablet (100 mg total) by mouth daily. 90 tablet 1   allopurinol (ZYLOPRIM) 300 MG tablet Take 1 tablet (300 mg total) by mouth daily. 90 tablet 1   apixaban (ELIQUIS) 5 MG TABS tablet Take 1 tablet (5 mg total) by mouth 2 (two) times daily. 180 tablet 0   buPROPion (WELLBUTRIN XL) 300 MG  24 hr tablet Take 1 tablet (300 mg total) by mouth daily. 90 tablet 3   flecainide (TAMBOCOR) 50 MG tablet Take 1 tablet (50 mg total) by mouth 2 (two) times daily. 180 tablet 3   lisinopril (ZESTRIL) 2.5 MG tablet TAKE 1 TABLET EVERY DAY 90 tablet 3   metoprolol succinate (TOPROL-XL) 25 MG 24 hr tablet Take 1 tablet (25 mg total) by mouth daily. 90 tablet 2   MITIGARE 0.6 MG CAPS Take 1 cap (0.6 mg total) by mouth daily as needed. May repeat in 2 hours 30 capsule 3   rosuvastatin (CRESTOR) 10 MG  tablet Take 1 tablet (10 mg total) by mouth daily. 90 tablet 3   gabapentin (NEURONTIN) 100 MG capsule TAKE ONE TO THREE CAPSULES AT BEDTIME FOR NERVE PAIN AS NEEDED (Patient not taking: Reported on 06/02/2021) 90 capsule 3   oxyCODONE (OXY IR/ROXICODONE) 5 MG immediate release tablet Take 5 mg by mouth every 6 (six) hours as needed. (Patient not taking: Reported on 06/02/2021)     sulfamethoxazole-trimethoprim (BACTRIM DS) 800-160 MG tablet Take 1 tablet by mouth every 12 (twelve) hours. (Patient not taking: Reported on 06/02/2021) 6 tablet 0   No current facility-administered medications for this visit.    Allergies:   Pravastatin and Penicillins   Social History:  The patient  reports that he quit smoking about 22 years ago. His smoking use included cigarettes. He has a 37.50 pack-year smoking history. He has never used smokeless tobacco. He reports current alcohol use of about 1.0 standard drink per week. He reports that he does not use drugs.   Family History:  The patient's family history includes Colon cancer in his father. He was adopted.    ROS:  Please see the history of present illness.   Otherwise, review of systems is positive for none.   All other systems are reviewed and negative.    PHYSICAL EXAM: VS:  BP 118/70   Pulse (!) 57   Ht '5\' 11"'$  (1.803 m)   Wt 218 lb (98.9 kg)   SpO2 97%   BMI 30.40 kg/m  , BMI Body mass index is 30.4 kg/m. GEN: Well nourished, well developed, in no acute distress  HEENT: normal  Neck: no JVD, carotid bruits, or masses Cardiac: RRR; no murmurs, rubs, or gallops,no edema  Respiratory:  clear to auscultation bilaterally, normal work of breathing GI: soft, nontender, nondistended, + BS MS: no deformity or atrophy  Skin: warm and dry Neuro:  Strength and sensation are intact Psych: euthymic mood, full affect  EKG:  EKG is ordered today. Personal review of the ekg ordered shows atrial flutter, rate 57  Recent Labs: 10/17/2020: ALT 17; BUN  26; Creat 1.48; Hemoglobin 14.0; Platelets 312; Potassium 4.5; Sodium 141; TSH 1.69    Lipid Panel     Component Value Date/Time   CHOL 126 01/03/2020 0847   TRIG 103 01/03/2020 0847   HDL 32 (L) 01/03/2020 0847   CHOLHDL 3.9 01/03/2020 0847   CHOLHDL 3.4 03/03/2018 1158   VLDL 26 10/18/2016 0844   LDLCALC 75 01/03/2020 0847   LDLCALC 78 03/03/2018 1158     Wt Readings from Last 3 Encounters:  06/02/21 218 lb (98.9 kg)  04/22/21 220 lb 6.4 oz (100 kg)  11/24/20 218 lb (98.9 kg)      Other studies Reviewed: Additional studies/ records that were reviewed today include: TTE 2020  Review of the above records today demonstrates:   1. The left ventricle has  normal systolic function with an ejection  fraction of 60-65%. The cavity size was normal. Left ventricular diastolic  parameters were normal.   2. The right ventricle has normal systolic function. The cavity was  normal. There is no increase in right ventricular wall thickness.   3. Left atrial size was mildly dilated.   4. The mitral valve is normal in structure. There is mild mitral annular  calcification present.   5. The tricuspid valve is normal in structure.   6. The aortic valve is tricuspid Moderate thickening of the aortic valve  Moderate calcification of the aortic valve.   7. The pulmonic valve was normal in structure.    ASSESSMENT AND PLAN:  1.  Atrial fibrillation/typical appearing atrial flutter: CHA2DS2-VASc of 3.  Currently on Eliquis 5 mg twice daily, flecainide 50 mg twice daily, Toprol-XL 25 mg daily.  He does appear to have atrial fibrillation based on ECG 10/07/2015.  He would prefer to avoid further medications.  He would prefer ablation.  Risks and benefits have been discussed which were bleeding, tamponade, heart block, stroke, damage to chest organs.  He understand these risks and is agreed to the procedure.  Risk, benefits, and alternatives to EP study and radiofrequency ablation for afib were also  discussed in detail today. These risks include but are not limited to stroke, bleeding, vascular damage, tamponade, perforation, damage to the esophagus, lungs, and other structures, pulmonary vein stenosis, worsening renal function, and death. The patient understands these risk and wishes to proceed.  We [name] therefore proceed with catheter ablation at the next available time.  Carto, ICE, anesthesia are requested for the procedure.  Brooklinn Longbottom also obtain CT PV protocol prior to the procedure to exclude LAA thrombus and further evaluate atrial anatomy.   2.  Hypertension: Currently well controlled  3.  Hyperlipidemia: Continue Crestor 10 mg daily per primary cardiology  Case discussed with primary cardiology  Current medicines are reviewed at length with the patient today.   The patient does not have concerns regarding his medicines.  The following changes were made today:  none  Labs/ tests ordered today include:  Orders Placed This Encounter  Procedures   CT CARDIAC MORPH/PULM VEIN W/CM&W/O CA SCORE   Basic metabolic panel   CBC   EKG 12-Lead     Disposition:   FU with Erian Lariviere 3 months  Signed, Nakeysha Pasqual Meredith Leeds, MD  06/02/2021 1:15 PM     [address] [place] [address] [phone] (office) [phone] (fax)

## 2021-06-19 ENCOUNTER — Other Ambulatory Visit: Payer: Self-pay | Admitting: Cardiovascular Disease

## 2021-07-28 ENCOUNTER — Other Ambulatory Visit: Payer: Self-pay

## 2021-07-28 ENCOUNTER — Other Ambulatory Visit: Payer: Medicare HMO | Admitting: *Deleted

## 2021-07-28 ENCOUNTER — Telehealth (HOSPITAL_COMMUNITY): Payer: Self-pay | Admitting: Emergency Medicine

## 2021-07-28 DIAGNOSIS — I483 Typical atrial flutter: Secondary | ICD-10-CM

## 2021-07-28 DIAGNOSIS — Z01812 Encounter for preprocedural laboratory examination: Secondary | ICD-10-CM

## 2021-07-28 DIAGNOSIS — I480 Paroxysmal atrial fibrillation: Secondary | ICD-10-CM

## 2021-07-28 NOTE — Telephone Encounter (Signed)
Reaching out to patient to offer assistance regarding upcoming cardiac imaging study; pt verbalizes understanding of appt date/time, parking situation and where to check in, pre-test NPO status and medications ordered, and verified current allergies; name and call back number provided for further questions should they arise Marchia Bond RN [facility] [phone] office [phone] cell  Daily meds Reminded to get labs TODAY

## 2021-07-29 ENCOUNTER — Encounter (HOSPITAL_COMMUNITY): Payer: Self-pay

## 2021-07-29 ENCOUNTER — Ambulatory Visit (HOSPITAL_COMMUNITY)
Admission: RE | Admit: 2021-07-29 | Discharge: 2021-07-29 | Disposition: A | Discharge status: Home / Self Care | Payer: Medicare HMO | Source: Ambulatory Visit | Attending: Cardiology | Admitting: Cardiology

## 2021-07-29 DIAGNOSIS — I480 Paroxysmal atrial fibrillation: Secondary | ICD-10-CM

## 2021-07-29 LAB — BASIC METABOLIC PANEL
BUN/Creatinine Ratio: 18 (ref 10–24)
BUN: 22 mg/dL (ref 8–27)
CO2: 24 mmol/L (ref 20–29)
Calcium: 9.7 mg/dL (ref 8.6–10.2)
Chloride: 106 mmol/L (ref 96–106)
Creatinine, Ser: 1.23 mg/dL (ref 0.76–1.27)
Glucose: 90 mg/dL (ref 70–99)
Potassium: 4.7 mmol/L (ref 3.5–5.2)
Sodium: 143 mmol/L (ref 134–144)
eGFR: 62 mL/min/{1.73_m2} (ref >59)

## 2021-07-29 LAB — CBC
Hematocrit: 46.6 % (ref 37.5–51.0)
Hemoglobin: 15.9 g/dL (ref 13.0–17.7)
MCH: 30.8 pg (ref 26.6–33.0)
MCHC: 34.1 g/dL (ref 31.5–35.7)
MCV: 90 fL (ref 79–97)
Platelets: 218 10*3/uL (ref 150–450)
RBC: 5.17 x10E6/uL (ref 4.14–5.80)
RDW: 12.9 % (ref 11.6–15.4)
WBC: 8.2 10*3/uL (ref 3.4–10.8)

## 2021-07-29 LAB — SPECIMEN STATUS

## 2021-07-29 LAB — SPECIMEN STATUS REPORT

## 2021-07-29 MED ORDER — IOHEXOL 350 MG/ML SOLN
80.0000 mL | Freq: Once | INTRAVENOUS | Status: AC | PRN
  Administered 2021-07-29: 80 mL via INTRAVENOUS

## 2021-07-30 ENCOUNTER — Other Ambulatory Visit: Payer: Self-pay | Admitting: *Deleted

## 2021-07-30 MED ORDER — LISINOPRIL 2.5 MG PO TABS: 2.5000 mg | ORAL_TABLET | Freq: Every day | ORAL | 3 refills | Status: DC

## 2021-07-31 ENCOUNTER — Telehealth: Payer: Self-pay | Admitting: Cardiology

## 2021-07-31 NOTE — Telephone Encounter (Signed)
Spoke to Angie at Dr. Aurea Graff office who informs me that pt does NOT need to take abx prior to any procedure except dental. Informed pt who disagrees with that statement. Advised to call Dr. Aurea Graff office to discuss this further. Pt is not sure that he will proceed with planned procedure now.  He is going to talk to his wife. Educated as to rational as to why he does NOT need abx, but pt does not agree with this.  States that is not what Dr. Alvan Dame told him. Advised to let me know by the end of the day if he is NOT going to proceed with planned procedure next Wednesday. Patient verbalized understanding and agreeable to plan.

## 2021-07-31 NOTE — Telephone Encounter (Signed)
  Pt c/o medication issue:  1. Name of Medication: antibiotics  2. How are you currently taking this medication (dosage and times per day)?   3. Are you having a reaction (difficulty breathing--STAT)?   4. What is your medication issue? Pt is scheduled for an ablations and he was told by ortho he needs to take antibiotics 3 days prior procedure, he needs antibiotics prescription

## 2021-07-31 NOTE — Telephone Encounter (Signed)
Returned call to pt. He reports that ortho did give him some abx to take. He will proceed with procedure next week. He appreciates my help with this and the follow up

## 2021-07-31 NOTE — Telephone Encounter (Signed)
Returned call to patient who reports he needs to take abx prior to any procedure. States that his orthopedic told him as much. Then goes onto to explain that he must have it prior to because when he had a prostate surgery they didn't listen, he didn't get abx before, he got IV prior to procedure and still ended up with severe infection and PICC line for 30 days after.  States he is not going through that again. Informed pt that we have never been made aware by ortho that that is needed prior to our ablations. Advised pt that I would follow up with Dr Aurea Graff office to discuss.

## 2021-08-04 NOTE — Pre-Procedure Instructions (Signed)
Attempted to call patient regarding procedure instructions.  Left voicemail on the following items: Arrival time 0630 Nothing to eat or drink after midnight No meds AM of procedure Responsible person to drive you home and stay with you for 24 hrs  Have you missed any doses of anti-coagulant Eliquis- take both doses, don't take any in the morning.

## 2021-08-05 ENCOUNTER — Ambulatory Visit (HOSPITAL_COMMUNITY)
Admission: RE | Disposition: A | Discharge status: Home / Self Care | Payer: Self-pay | Source: Home / Self Care | Attending: Cardiology

## 2021-08-05 ENCOUNTER — Ambulatory Visit (HOSPITAL_COMMUNITY): Payer: Medicare HMO | Admitting: Anesthesiology

## 2021-08-05 ENCOUNTER — Ambulatory Visit (HOSPITAL_COMMUNITY)
Admission: RE | Admit: 2021-08-05 | Discharge: 2021-08-05 | Disposition: A | Discharge status: Home / Self Care | Payer: Medicare HMO | Attending: Cardiology | Admitting: Cardiology

## 2021-08-05 DIAGNOSIS — I480 Paroxysmal atrial fibrillation: Secondary | ICD-10-CM | POA: Insufficient documentation

## 2021-08-05 DIAGNOSIS — I10 Essential (primary) hypertension: Secondary | ICD-10-CM | POA: Insufficient documentation

## 2021-08-05 DIAGNOSIS — E785 Hyperlipidemia, unspecified: Secondary | ICD-10-CM | POA: Diagnosis not present

## 2021-08-05 DIAGNOSIS — I4892 Unspecified atrial flutter: Secondary | ICD-10-CM | POA: Insufficient documentation

## 2021-08-05 DIAGNOSIS — E119 Type 2 diabetes mellitus without complications: Secondary | ICD-10-CM | POA: Insufficient documentation

## 2021-08-05 DIAGNOSIS — I483 Typical atrial flutter: Secondary | ICD-10-CM

## 2021-08-05 HISTORY — PX: ATRIAL FIBRILLATION ABLATION: EP1191

## 2021-08-05 LAB — POCT ACTIVATED CLOTTING TIME
Activated Clotting Time: 289 s
Activated Clotting Time: 289 s
Activated Clotting Time: 335 seconds

## 2021-08-05 SURGERY — ATRIAL FIBRILLATION ABLATION
Anesthesia: General

## 2021-08-05 MED ORDER — PHENYLEPHRINE HCL-NACL 20-0.9 MG/250ML-% IV SOLN
INTRAVENOUS | Status: DC | PRN
  Administered 2021-08-05: 40 ug/min via INTRAVENOUS

## 2021-08-05 MED ORDER — MIDAZOLAM HCL 2 MG/2ML IJ SOLN
INTRAMUSCULAR | Status: DC | PRN
  Administered 2021-08-05: 1 mg via INTRAVENOUS

## 2021-08-05 MED ORDER — ONDANSETRON HCL 4 MG/2ML IJ SOLN: 4.0000 mg | Freq: Four times a day (QID) | INTRAMUSCULAR | Status: DC | PRN

## 2021-08-05 MED ORDER — HEPARIN SODIUM (PORCINE) 1000 UNIT/ML IJ SOLN
INTRAMUSCULAR | Status: DC | PRN
  Administered 2021-08-05: 1000 [IU] via INTRAVENOUS

## 2021-08-05 MED ORDER — ONDANSETRON HCL 4 MG/2ML IJ SOLN
INTRAMUSCULAR | Status: DC | PRN
  Administered 2021-08-05: 4 mg via INTRAVENOUS

## 2021-08-05 MED ORDER — HEPARIN (PORCINE) IN NACL 2000-0.9 UNIT/L-% IV SOLN
INTRAVENOUS | Status: AC
  Filled 2021-08-05: qty 1000

## 2021-08-05 MED ORDER — DEXAMETHASONE SODIUM PHOSPHATE 10 MG/ML IJ SOLN
INTRAMUSCULAR | Status: DC | PRN
  Administered 2021-08-05: 5 mg via INTRAVENOUS

## 2021-08-05 MED ORDER — HEPARIN (PORCINE) IN NACL 1000-0.9 UT/500ML-% IV SOLN
INTRAVENOUS | Status: AC
  Filled 2021-08-05: qty 500

## 2021-08-05 MED ORDER — ACETAMINOPHEN 500 MG PO TABS: 1000.0000 mg | ORAL_TABLET | Freq: Once | ORAL | Status: DC | PRN

## 2021-08-05 MED ORDER — HEPARIN SODIUM (PORCINE) 1000 UNIT/ML IJ SOLN
INTRAMUSCULAR | Status: DC | PRN
  Administered 2021-08-05: 6000 [IU] via INTRAVENOUS
  Administered 2021-08-05: 5000 [IU] via INTRAVENOUS
  Administered 2021-08-05: 15000 [IU] via INTRAVENOUS

## 2021-08-05 MED ORDER — ACETAMINOPHEN 10 MG/ML IV SOLN
1000.0000 mg | Freq: Once | INTRAVENOUS | Status: DC | PRN
  Filled 2021-08-05: qty 100

## 2021-08-05 MED ORDER — SODIUM CHLORIDE 0.9% FLUSH: 3.0000 mL | Freq: Two times a day (BID) | INTRAVENOUS | Status: DC

## 2021-08-05 MED ORDER — ROCURONIUM BROMIDE 10 MG/ML (PF) SYRINGE
PREFILLED_SYRINGE | INTRAVENOUS | Status: DC | PRN
  Administered 2021-08-05: 50 mg via INTRAVENOUS
  Administered 2021-08-05: 20 mg via INTRAVENOUS

## 2021-08-05 MED ORDER — ACETAMINOPHEN 160 MG/5ML PO SOLN: 1000.0000 mg | Freq: Once | ORAL | Status: DC | PRN

## 2021-08-05 MED ORDER — ACETAMINOPHEN 325 MG PO TABS
ORAL_TABLET | ORAL | Status: AC
  Filled 2021-08-05: qty 2

## 2021-08-05 MED ORDER — ACETAMINOPHEN 325 MG PO TABS
650.0000 mg | ORAL_TABLET | ORAL | Status: DC | PRN
  Administered 2021-08-05: 650 mg via ORAL

## 2021-08-05 MED ORDER — DOBUTAMINE INFUSION FOR EP/ECHO/NUC (1000 MCG/ML)
INTRAVENOUS | Status: DC | PRN
  Administered 2021-08-05: 20 ug/kg/min via INTRAVENOUS

## 2021-08-05 MED ORDER — SODIUM CHLORIDE 0.9% FLUSH: 3.0000 mL | INTRAVENOUS | Status: DC | PRN

## 2021-08-05 MED ORDER — SODIUM CHLORIDE 0.9 % IV SOLN: 250.0000 mL | INTRAVENOUS | Status: DC | PRN

## 2021-08-05 MED ORDER — PHENYLEPHRINE 40 MCG/ML (10ML) SYRINGE FOR IV PUSH (FOR BLOOD PRESSURE SUPPORT)
PREFILLED_SYRINGE | INTRAVENOUS | Status: DC | PRN
  Administered 2021-08-05 (×2): 120 ug via INTRAVENOUS
  Administered 2021-08-05: 80 ug via INTRAVENOUS
  Administered 2021-08-05: 120 ug via INTRAVENOUS
  Administered 2021-08-05: 80 ug via INTRAVENOUS

## 2021-08-05 MED ORDER — LIDOCAINE 2% (20 MG/ML) 5 ML SYRINGE
INTRAMUSCULAR | Status: DC | PRN
  Administered 2021-08-05: 80 mg via INTRAVENOUS

## 2021-08-05 MED ORDER — DOBUTAMINE INFUSION FOR EP/ECHO/NUC (1000 MCG/ML)
INTRAVENOUS | Status: AC
  Filled 2021-08-05: qty 250

## 2021-08-05 MED ORDER — FENTANYL CITRATE (PF) 100 MCG/2ML IJ SOLN: 25.0000 ug | INTRAMUSCULAR | Status: DC | PRN

## 2021-08-05 MED ORDER — SODIUM CHLORIDE 0.9 % IV SOLN: INTRAVENOUS | Status: DC

## 2021-08-05 MED ORDER — HEPARIN (PORCINE) IN NACL 1000-0.9 UT/500ML-% IV SOLN
INTRAVENOUS | Status: DC | PRN
  Administered 2021-08-05 (×5): 500 mL

## 2021-08-05 MED ORDER — PROTAMINE SULFATE 10 MG/ML IV SOLN
INTRAVENOUS | Status: DC | PRN
  Administered 2021-08-05: 40 mg via INTRAVENOUS

## 2021-08-05 MED ORDER — PROPOFOL 10 MG/ML IV BOLUS
INTRAVENOUS | Status: DC | PRN
  Administered 2021-08-05: 150 mg via INTRAVENOUS
  Administered 2021-08-05: 50 mg via INTRAVENOUS

## 2021-08-05 MED ORDER — FENTANYL CITRATE (PF) 100 MCG/2ML IJ SOLN
INTRAMUSCULAR | Status: DC | PRN
  Administered 2021-08-05: 50 ug via INTRAVENOUS

## 2021-08-05 MED ORDER — SUGAMMADEX SODIUM 200 MG/2ML IV SOLN
INTRAVENOUS | Status: DC | PRN
  Administered 2021-08-05: 200 mg via INTRAVENOUS

## 2021-08-05 SURGICAL SUPPLY — 20 items
CATH 8FR REPROCESSED SOUNDSTAR (CATHETERS) ×3 IMPLANT
CATH 8FR SOUNDSTAR REPROCESSED (CATHETERS) IMPLANT
CATH OCTARAY 2.0 F 3-3-3-3-3 (CATHETERS) ×2 IMPLANT
CATH S CIRCA THERM PROBE 10F (CATHETERS) ×2 IMPLANT
CATH SMTCH THERMOCOOL SF DF (CATHETERS) ×2 IMPLANT
CATH WEB BI DIR CSDF CRV REPRO (CATHETERS) ×2 IMPLANT
CLOSURE PERCLOSE PROSTYLE (VASCULAR PRODUCTS) ×8 IMPLANT
COVER SWIFTLINK CONNECTOR (BAG) ×3 IMPLANT
KIT VERSACROSS STEERABLE D1 (CATHETERS) ×2 IMPLANT
MAT PREVALON FULL STRYKER (MISCELLANEOUS) ×2 IMPLANT
PACK EP LATEX FREE (CUSTOM PROCEDURE TRAY) ×3
PACK EP LF (CUSTOM PROCEDURE TRAY) ×1 IMPLANT
PAD PRO RADIOLUCENT 2001M-C (PAD) ×3 IMPLANT
PATCH CARTO3 (PAD) ×2 IMPLANT
SHEATH CARTO VIZIGO SM CVD (SHEATH) ×2 IMPLANT
SHEATH PINNACLE 7F 10CM (SHEATH) ×2 IMPLANT
SHEATH PINNACLE 8F 10CM (SHEATH) ×4 IMPLANT
SHEATH PINNACLE 9F 10CM (SHEATH) ×2 IMPLANT
SHEATH PROBE COVER 6X72 (BAG) ×2 IMPLANT
TUBING SMART ABLATE COOLFLOW (TUBING) ×2 IMPLANT

## 2021-08-05 NOTE — Progress Notes (Signed)
Pt states he is having severe left leg pain upon standing.  No swelling or bleeding noted at site.  Pt ambulated to BR and states" it feels a little better."  Wife concerned.  Dr Curt Bears notified and PA Jonni Sanger came to see pt.  Agrees that pt does not appesr to be bleeding and it is most likely a leg cramp.

## 2021-08-05 NOTE — Transfer of Care (Signed)
Immediate Anesthesia Transfer of Care Note  Patient: Everlene Farrier  Procedure(s) Performed: ATRIAL FIBRILLATION ABLATION  Patient Location: Cath Lab  Anesthesia Type:General  Level of Consciousness: awake and alert   Airway & Oxygen Therapy: Patient Spontanous Breathing and Patient connected to face mask oxygen  Post-op Assessment: Report given to RN and Post -op Vital signs reviewed and stable  Post vital signs: Reviewed and stable  Last Vitals:  Vitals Value Taken Time  BP 91/55 08/05/21 1110  Temp    Pulse 76 08/05/21 1112  Resp 14 08/05/21 1112  SpO2 94 % 08/05/21 1112  Vitals shown include unvalidated device data.  Last Pain:  Vitals:   08/05/21 0649  TempSrc:   PainSc: 0-No pain         Complications: There were no known notable events for this encounter.

## 2021-08-05 NOTE — Progress Notes (Signed)
Dr Curt Bears states for pt to restart Eliquis tonight pt and his wife informed. Both voice understanding.

## 2021-08-05 NOTE — Anesthesia Procedure Notes (Signed)
Procedure Name: Intubation Date/Time: 08/05/2021 8:52 AM Performed by: Lorie Phenix, CRNA Pre-anesthesia Checklist: Patient identified, Emergency Drugs available, Suction available and Patient being monitored Patient Re-evaluated:Patient Re-evaluated prior to induction Oxygen Delivery Method: Circle system utilized Preoxygenation: Pre-oxygenation with 100% oxygen Induction Type: IV induction Ventilation: Mask ventilation without difficulty Laryngoscope Size: Mac and 4 Grade View: Grade I Tube type: Oral Number of attempts: 1 Airway Equipment and Method: Stylet and Oral airway Placement Confirmation: ETT inserted through vocal cords under direct vision, positive ETCO2 and breath sounds checked- equal and bilateral Secured at: 23 cm Tube secured with: Tape Dental Injury: Teeth and Oropharynx as per pre-operative assessment

## 2021-08-05 NOTE — Anesthesia Preprocedure Evaluation (Signed)
Anesthesia Evaluation  Patient identified by MRN, date of birth, ID band Patient awake    Reviewed: Allergy & Precautions, NPO status , Patient's Chart, lab work & pertinent test results  Airway Mallampati: III  TM Distance: >3 FB Neck ROM: Full    Dental  (+) Dental Advisory Given, Teeth Intact   Pulmonary neg shortness of breath, neg sleep apnea, neg COPD, neg recent URI, former smoker,    breath sounds clear to auscultation       Cardiovascular hypertension, Pt. on medications and Pt. on home beta blockers + dysrhythmias Atrial Fibrillation  Rhythm:Irregular     Neuro/Psych PSYCHIATRIC DISORDERS Anxiety negative neurological ROS     GI/Hepatic negative GI ROS, Neg liver ROS,   Endo/Other  diabetes  Renal/GU negative Renal ROS     Musculoskeletal  (+) Arthritis ,   Abdominal   Peds  Hematology eliquis  Lab Results      Component                Value               Date                      WBC                      WILL FOLLOW         07/28/2021                WBC                      8.2                 07/28/2021                HGB                      WILL FOLLOW         07/28/2021                HGB                      15.9                07/28/2021                HCT                      WILL FOLLOW         07/28/2021                HCT                      46.6                07/28/2021                MCV                      WILL FOLLOW         07/28/2021                MCV                      90  07/28/2021                PLT                      WILL FOLLOW         07/28/2021                PLT                      218                 07/28/2021              Anesthesia Other Findings   Reproductive/Obstetrics                             Anesthesia Physical Anesthesia Plan  ASA: 2  Anesthesia Plan: General   Post-op Pain Management:    Induction:  Intravenous  PONV Risk Score and Plan: 2 and Ondansetron and Dexamethasone  Airway Management Planned: Oral ETT  Additional Equipment: None  Intra-op Plan:   Post-operative Plan: Extubation in OR  Informed Consent: I have reviewed the patients History and Physical, chart, labs and discussed the procedure including the risks, benefits and alternatives for the proposed anesthesia with the patient or authorized representative who has indicated his/her understanding and acceptance.     Dental advisory given  Plan Discussed with: CRNA and Anesthesiologist  Anesthesia Plan Comments:         Anesthesia Quick Evaluation

## 2021-08-05 NOTE — H&P (Signed)
Electrophysiology Office Note   Date:  08/05/2021   ID:  Jimmy Bradshaw, DOB [birth year], MRN [id number]  PCP:  Susy Frizzle, MD  Cardiologist:  [facility] Primary Electrophysiologist:  Ormand Senn Meredith Leeds, MD    Chief Complaint: atrial flutter   History of Present Illness: Jimmy Bradshaw is a 75 y.o. male who is being seen today for the evaluation of AF at the request of No ref. provider found. Presenting today for electrophysiology evaluation.  He has a history significant for paroxysmal atrial flutter, hypertension, hyperlipidemia, diabetes.  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  He was seen in cardiology clinic and was noted to be in atrial flutter.  He was unaware of his arrhythmia.  In review of his ECGs, he does have evidence of atrial fibrillation by an ECG on 10/07/2015.  He would like to avoid medications and would prefer ablation.  Today, denies symptoms of palpitations, chest pain, shortness of breath, orthopnea, PND, lower extremity edema, claudication, dizziness, presyncope, syncope, bleeding, or neurologic sequela. The patient is tolerating medications without difficulties. Plan ablation today.   Past Medical History:  Diagnosis Date   Anxiety    Arthritis    HANDS,  SHOULDERS   Bladder neck obstruction    BPH (benign prostatic hypertrophy)    Frequency of urination    H/O: gout    History of colon polyps    Hyperlipidemia    Nocturia    PAF (paroxysmal atrial fibrillation) ([place])    Type 2 diabetes mellitus ([place])    diet controlled   Urgency of urination    Wears hearing aid    BILATERAL   Past Surgical History:  Procedure Laterality Date   CARDIOVERSION N/A 10/26/2016   Procedure: CARDIOVERSION;  Surgeon: Larey Dresser, MD;  Location: [place];  Service: Cardiovascular;  Laterality: N/A;   CARDIOVERSION N/A 04/17/2019   Procedure: CARDIOVERSION;  Surgeon: Sueanne Margarita, MD;  Location: [facility]  ENDOSCOPY;  Service: Cardiovascular;  Laterality: N/A;   COLONOSCOPY  08-30-2011, 2017   I & D KNEE WITH POLY EXCHANGE Left 10/08/2014   Procedure: IRRIGATION AND DEBRIDEMENT LEFT UNI KNEE ARTHOPLASTY  WITH POLY EXCHANGE;  Surgeon: Mauri Pole, MD;  Location: [facility];  Service: Orthopedics;  Laterality: Left;   KNEE ARTHROSCOPY Left 11/ 2013   PARTIAL KNEE ARTHROPLASTY Left 02/05/2013   Procedure: LEFT KNEE MEDIAL UNICOMPARTMENTAL KNEE;  Surgeon: Mauri Pole, MD;  Location: [facility];  Service: Orthopedics;  Laterality: Left;   PICC LINE INSERTION     and removed later   TRANSURETHRAL RESECTION OF PROSTATE N/A 09/30/2014   Procedure: TRANSURETHRAL RESECTION OF THE PROSTATE WITH GYRUS INSTRUMENTS;  Surgeon: Bernestine Amass, MD;  Location[facility];  Service: Urology;  Laterality: N/A;   TRANSURETHRAL RESECTION OF PROSTATE N/A 07/03/2020   Procedure: TRANSURETHRAL RESECTION OF THE PROSTATE (TURP);  Surgeon: Franchot Gallo, MD;  Location: [facility];  Service: Urology;  Laterality: N/A;  1 HR     Current Facility-Administered Medications  Medication Dose Route Frequency Provider Last Rate Last Admin   0.9 %  sodium chloride infusion   Intravenous Continuous Maisyn Nouri, Ocie Doyne, MD 50 mL/hr at 08/05/21 0700 New Bag at 08/05/21 0700    Allergies:   Pravastatin and Penicillins   Social History:  The patient  reports that he quit smoking about 22 years ago. His smoking use included cigarettes. He has a 37.50 pack-year  smoking history. He has never used smokeless tobacco. He reports current alcohol use of about 1.0 standard drink per week. He reports that he does not use drugs.   Family History:  The patient's family history includes Colon cancer in his father. He was adopted.   ROS:  Please see the history of present illness.   Otherwise, review of systems is positive for none.   All other systems are reviewed and negative.   PHYSICAL EXAM: VS:  BP 125/83    Pulse 85   Temp 98 F (36.7 C) (Oral)   Resp 18   Ht 5' 11.5" (1.816 m)   Wt 98.4 kg   SpO2 97%   BMI 29.84 kg/m  , BMI Body mass index is 29.84 kg/m. GEN: Well nourished, well developed, in no acute distress  HEENT: normal  Neck: no JVD, carotid bruits, or masses Cardiac: RRR; no murmurs, rubs, or gallops,no edema  Respiratory:  clear to auscultation bilaterally, normal work of breathing GI: soft, nontender, nondistended, + BS MS: no deformity or atrophy  Skin: warm and dry Neuro:  Strength and sensation are intact Psych: euthymic mood, full affect  Recent Labs: 10/17/2020: ALT 17; TSH 1.69 07/28/2021: BUN 22; Creatinine, Ser 1.23; Hemoglobin Awilda Covin FOLLOW; Hemoglobin 15.9; Platelets Shauntea Lok FOLLOW; Platelets 218; Potassium 4.7; Sodium 143    Lipid Panel     Component Value Date/Time   CHOL 126 01/03/2020 0847   TRIG 103 01/03/2020 0847   HDL 32 (L) 01/03/2020 0847   CHOLHDL 3.9 01/03/2020 0847   CHOLHDL 3.4 03/03/2018 1158   VLDL 26 10/18/2016 0844   LDLCALC 75 01/03/2020 0847   LDLCALC 78 03/03/2018 1158     Wt Readings from Last 3 Encounters:  08/05/21 98.4 kg  06/02/21 98.9 kg  04/22/21 100 kg      Other studies Reviewed: Additional studies/ records that were reviewed today include: TTE 2020  Review of the above records today demonstrates:   1. The left ventricle has normal systolic function with an ejection  fraction of 60-65%. The cavity size was normal. Left ventricular diastolic  parameters were normal.   2. The right ventricle has normal systolic function. The cavity was  normal. There is no increase in right ventricular wall thickness.   3. Left atrial size was mildly dilated.   4. The mitral valve is normal in structure. There is mild mitral annular  calcification present.   5. The tricuspid valve is normal in structure.   6. The aortic valve is tricuspid Moderate thickening of the aortic valve  Moderate calcification of the aortic valve.   7. The  pulmonic valve was normal in structure.    ASSESSMENT AND PLAN:  1.  Atrial fibrillation/typical appearing atrial flutter: Jimmy Bradshaw has presented today for surgery, with the diagnosis of AF/flutter.  The various methods of treatment have been discussed with the patient and family. After consideration of risks, benefits and other options for treatment, the patient has consented to  Procedure(s): Catheter ablation as a surgical intervention .  Risks include but not limited to complete heart block, stroke, esophageal damage, nerve damage, bleeding, vascular damage, tamponade, perforation, MI, and death. The patient's history has been reviewed, patient examined, no change in status, stable for surgery.  I have reviewed the patient's chart and labs.  Questions were answered to the patient's satisfaction.    Damyra Luscher Curt Bears, MD 08/05/2021 7:09 AM

## 2021-08-05 NOTE — Progress Notes (Signed)
Pt's pain is almost completley resolved and pt is cleared for dischargePt ambulated without difficulty or bleeding.   Discharged home with wife  who will drive and stay with pt x 24 hrs

## 2021-08-06 ENCOUNTER — Other Ambulatory Visit: Payer: Self-pay

## 2021-08-06 ENCOUNTER — Emergency Department (HOSPITAL_COMMUNITY)
Admission: EM | Admit: 2021-08-06 | Discharge: 2021-08-06 | Disposition: A | Discharge status: Home / Self Care | Payer: Medicare HMO | Attending: Emergency Medicine | Admitting: Emergency Medicine

## 2021-08-06 ENCOUNTER — Encounter (HOSPITAL_COMMUNITY): Payer: Self-pay | Admitting: Cardiology

## 2021-08-06 ENCOUNTER — Emergency Department (HOSPITAL_COMMUNITY): Payer: Medicare HMO

## 2021-08-06 ENCOUNTER — Emergency Department (HOSPITAL_BASED_OUTPATIENT_CLINIC_OR_DEPARTMENT_OTHER)
Admit: 2021-08-06 | Discharge: 2021-08-06 | Disposition: A | Discharge status: Home / Self Care | Payer: Medicare HMO | Attending: Emergency Medicine | Admitting: Emergency Medicine

## 2021-08-06 DIAGNOSIS — M79652 Pain in left thigh: Secondary | ICD-10-CM

## 2021-08-06 DIAGNOSIS — R072 Precordial pain: Secondary | ICD-10-CM | POA: Diagnosis not present

## 2021-08-06 DIAGNOSIS — R079 Chest pain, unspecified: Secondary | ICD-10-CM | POA: Diagnosis not present

## 2021-08-06 DIAGNOSIS — Z87891 Personal history of nicotine dependence: Secondary | ICD-10-CM | POA: Insufficient documentation

## 2021-08-06 DIAGNOSIS — I10 Essential (primary) hypertension: Secondary | ICD-10-CM | POA: Insufficient documentation

## 2021-08-06 DIAGNOSIS — I4892 Unspecified atrial flutter: Secondary | ICD-10-CM

## 2021-08-06 DIAGNOSIS — R071 Chest pain on breathing: Secondary | ICD-10-CM | POA: Diagnosis not present

## 2021-08-06 DIAGNOSIS — Z96652 Presence of left artificial knee joint: Secondary | ICD-10-CM | POA: Diagnosis not present

## 2021-08-06 DIAGNOSIS — E1140 Type 2 diabetes mellitus with diabetic neuropathy, unspecified: Secondary | ICD-10-CM | POA: Diagnosis not present

## 2021-08-06 DIAGNOSIS — Z7901 Long term (current) use of anticoagulants: Secondary | ICD-10-CM | POA: Diagnosis not present

## 2021-08-06 DIAGNOSIS — M25552 Pain in left hip: Secondary | ICD-10-CM | POA: Diagnosis not present

## 2021-08-06 DIAGNOSIS — Z79899 Other long term (current) drug therapy: Secondary | ICD-10-CM | POA: Insufficient documentation

## 2021-08-06 DIAGNOSIS — J9811 Atelectasis: Secondary | ICD-10-CM | POA: Diagnosis not present

## 2021-08-06 LAB — CBC
HCT: 40.7 % (ref 39.0–52.0)
Hemoglobin: 13 g/dL (ref 13.0–17.0)
MCH: 30.7 pg (ref 26.0–34.0)
MCHC: 31.9 g/dL (ref 30.0–36.0)
MCV: 96.2 fL (ref 80.0–100.0)
Platelets: 188 10*3/uL (ref 150–400)
RBC: 4.23 MIL/uL (ref 4.22–5.81)
RDW: 14 % (ref 11.5–15.5)
WBC: 15.7 10*3/uL — ABNORMAL HIGH (ref 4.0–10.5)
nRBC: 0 % (ref 0.0–0.2)

## 2021-08-06 LAB — BASIC METABOLIC PANEL
Anion gap: 10 (ref 5–15)
BUN: 19 mg/dL (ref 8–23)
CO2: 22 mmol/L (ref 22–32)
Calcium: 8.8 mg/dL — ABNORMAL LOW (ref 8.9–10.3)
Chloride: 104 mmol/L (ref 98–111)
Creatinine, Ser: 1.3 mg/dL — ABNORMAL HIGH (ref 0.61–1.24)
GFR, Estimated: 58 mL/min — ABNORMAL LOW (ref >60)
Glucose, Bld: 145 mg/dL — ABNORMAL HIGH (ref 70–99)
Potassium: 3.6 mmol/L (ref 3.5–5.1)
Sodium: 136 mmol/L (ref 135–145)

## 2021-08-06 LAB — TROPONIN I (HIGH SENSITIVITY)
Troponin I (High Sensitivity): 1896 ng/L (ref <18)
Troponin I (High Sensitivity): 2068 ng/L (ref <18)

## 2021-08-06 MED ORDER — COLCHICINE 0.6 MG PO TABS: 0.6000 mg | ORAL_TABLET | Freq: Two times a day (BID) | ORAL | 0 refills | Status: DC

## 2021-08-06 MED ORDER — NITROGLYCERIN 0.4 MG SL SUBL
0.4000 mg | SUBLINGUAL_TABLET | SUBLINGUAL | Status: DC | PRN
  Administered 2021-08-06: 18:00:00 0.4 mg via SUBLINGUAL
  Filled 2021-08-06: qty 1

## 2021-08-06 MED ORDER — IOHEXOL 350 MG/ML SOLN
80.0000 mL | Freq: Once | INTRAVENOUS | Status: AC | PRN
  Administered 2021-08-06: 19:00:00 80 mL via INTRAVENOUS

## 2021-08-06 NOTE — Discharge Instructions (Signed)
You were seen in the emergency room today with chest discomfort.  We suspect that this is related to irritation after your procedure yesterday.  I would like for you to take colchicine as prescribed for the next 5 days and follow-up with [facility].  Return to the emergency department any new or suddenly worsening symptoms.

## 2021-08-06 NOTE — Progress Notes (Signed)
Left lower extremity venous duplex completed. Refer to "CV Proc" under chart review to view preliminary results.  08/06/2021 4:01 PM Kelby Aline., MHA, RVT, RDCS, RDMS

## 2021-08-06 NOTE — ED Provider Notes (Signed)
[facility] EMERGENCY DEPARTMENT Provider Note   CSN: [id number] Arrival date & time: 08/06/21  1329     History Chief Complaint  Patient presents with   Chest Pain    Jimmy Bradshaw is a 75 y.o. male.   Chest Pain Associated symptoms: no abdominal pain, no back pain, no cough, no dizziness, no fever, no headache, no nausea, no numbness, no palpitations, no shortness of breath, no vomiting and no weakness    75 year old male with PMH significant for HLD, paroxysmal atrial flutter/fibrillation on Eliquis, type II DM who is s/p catheter ablation with cardiology yesterday who presents to the ED with complaints of chest pain.  Patient reports that at 1:00 PM today, he had sudden onset left-sided chest pain radiating down his left arm.  He also reports left posterior LE pain.  He denies any dizziness or lightheadedness, syncope, shortness of breath, cough, diaphoresis, abdominal pain or nausea, vomiting, or any other complaints.  The left groin access site has been clean and intact.  Patient denies any prior history of MI, blood clots, or strokes.  He reports compliance on his home Eliquis, stopped for short period around the procedure as ordered, but took his normal dose today.  Past Medical History:  Diagnosis Date   Anxiety    Arthritis    HANDS,  SHOULDERS   Bladder neck obstruction    BPH (benign prostatic hypertrophy)    Frequency of urination    H/O: gout    History of colon polyps    Hyperlipidemia    Nocturia    PAF (paroxysmal atrial fibrillation) ([place])    Type 2 diabetes mellitus ([place])    diet controlled   Urgency of urination    Wears hearing aid    BILATERAL    Patient Active Problem List   Diagnosis Date Noted   Atrial flutter ([place]) 04/03/2019   Chronic left shoulder pain 03/03/2018   Diabetic neuropathy ([place]) 03/04/2017   Epistaxis 08/30/2016   Perennial allergic rhinitis 08/30/2016   HTN (hypertension) 11/08/2014   PAF (paroxysmal atrial  fibrillation) ([place]) 11/06/2014   Knee pain, acute 10/08/2014   BPH with urinary obstruction 09/30/2014   Type 2 diabetes mellitus with diabetic neuropathy, unspecified ([place]) 10/16/2013   Back pain 06/08/2013   Obesity (BMI 30.0-34.9) 02/06/2013   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/05/2013   Hyperlipidemia    Anxiety    BPH (benign prostatic hyperplasia)    Colon polyps    Gout     Past Surgical History:  Procedure Laterality Date   ATRIAL FIBRILLATION ABLATION N/A 08/05/2021   Procedure: ATRIAL FIBRILLATION ABLATION;  Surgeon: Constance Haw, MD;  Location: [place] [facility];  Service: Cardiovascular;  Laterality: N/A;   CARDIOVERSION N/A 10/26/2016   Procedure: CARDIOVERSION;  Surgeon: Larey Dresser, MD;  Location: [place];  Service: Cardiovascular;  Laterality: N/A;   CARDIOVERSION N/A 04/17/2019   Procedure: CARDIOVERSION;  Surgeon: Sueanne Margarita, MD;  Location: [facility] ENDOSCOPY;  Service: Cardiovascular;  Laterality: N/A;   COLONOSCOPY  08-30-2011, 2017   I & D KNEE WITH POLY EXCHANGE Left 10/08/2014   Procedure: IRRIGATION AND DEBRIDEMENT LEFT UNI KNEE ARTHOPLASTY  WITH POLY EXCHANGE;  Surgeon: Mauri Pole, MD;  Location: [facility] ORS;  Service: Orthopedics;  Laterality: Left;   KNEE ARTHROSCOPY Left 11/ 2013   PARTIAL KNEE ARTHROPLASTY Left 02/05/2013   Procedure: LEFT KNEE MEDIAL UNICOMPARTMENTAL KNEE;  Surgeon: Mauri Pole, MD;  Location: [facility] ORS;  Service: Orthopedics;  Laterality: Left;   PICC LINE INSERTION     and removed later   TRANSURETHRAL RESECTION OF PROSTATE N/A 09/30/2014   Procedure: TRANSURETHRAL RESECTION OF THE PROSTATE WITH GYRUS INSTRUMENTS;  Surgeon: Bernestine Amass, MD;  Location: [facility];  Service: Urology;  Laterality: N/A;   TRANSURETHRAL RESECTION OF PROSTATE N/A 07/03/2020   Procedure: TRANSURETHRAL RESECTION OF THE PROSTATE (TURP);  Surgeon: Franchot Gallo, MD;  Location: [facility];  Service:  Urology;  Laterality: N/A;  1 HR       Family History  Adopted: Yes  Problem Relation Age of Onset   Colon cancer Father     Social History   Tobacco Use   Smoking status: Former    Packs/day: 1.50    Years: 25.00    Pack years: 37.50    Types: Cigarettes    Quit date: 12/08/1998    Years since quitting: 22.6   Smokeless tobacco: Never  Vaping Use   Vaping Use: Never used  Substance Use Topics   Alcohol use: Yes    Alcohol/week: 1.0 standard drink    Types: 1 Glasses of wine per week    Comment: RARE - a sip, occassional   Drug use: No    Home Medications Prior to Admission medications   Medication Sig Start Date End Date Taking? Authorizing Provider  allopurinol (ZYLOPRIM) 100 MG tablet Take 1 tablet (100 mg total) by mouth daily. Patient taking differently: Take 400 mg by mouth daily. 03/27/21  Yes Susy Frizzle, MD  apixaban (ELIQUIS) 5 MG TABS tablet Take 1 tablet (5 mg total) by mouth 2 (two) times daily. 02/09/21  Yes Nahser, Wonda Cheng, MD  buPROPion (WELLBUTRIN XL) 300 MG 24 hr tablet Take 1 tablet (300 mg total) by mouth daily. 03/05/21  Yes Susy Frizzle, MD  flecainide (TAMBOCOR) 50 MG tablet Take 1 tablet (50 mg total) by mouth 2 (two) times daily. 01/14/21  Yes Nahser, Wonda Cheng, MD  lisinopril (ZESTRIL) 2.5 MG tablet Take 1 tablet (2.5 mg total) by mouth daily. 07/30/21  Yes Susy Frizzle, MD  metoprolol succinate (TOPROL-XL) 25 MG 24 hr tablet TAKE 1 TABLET EVERY DAY 06/22/21  Yes Nahser, Wonda Cheng, MD  rosuvastatin (CRESTOR) 10 MG tablet Take 1 tablet (10 mg total) by mouth daily. Patient taking differently: Take 10 mg by mouth at bedtime. 04/10/21  Yes Susy Frizzle, MD  allopurinol (ZYLOPRIM) 300 MG tablet Take 1 tablet (300 mg total) by mouth daily. Patient not taking: Reported on 08/06/2021 03/27/21   Susy Frizzle, MD  colchicine 0.6 MG tablet Take 1 tablet (0.6 mg total) by mouth 2 (two) times daily for 5 days. 08/06/21 08/11/21  Margette Fast, MD    Allergies    Pravastatin and Penicillins  Review of Systems   Review of Systems  Constitutional:  Negative for activity change, appetite change, chills and fever.  Eyes:  Negative for photophobia and visual disturbance.  Respiratory:  Negative for cough and shortness of breath.   Cardiovascular:  Positive for chest pain. Negative for palpitations and leg swelling.  Gastrointestinal:  Negative for abdominal pain, nausea and vomiting.  Musculoskeletal:  Negative for arthralgias, back pain, neck pain and neck stiffness.  Skin:  Negative for color change, pallor and rash.  Neurological:  Negative for dizziness, tremors, seizures, syncope, speech difficulty, weakness, light-headedness, numbness and headaches.  Hematological:  Bruises/bleeds easily.  Psychiatric/Behavioral:  Negative for confusion. The  patient is not nervous/anxious.   All other systems reviewed and are negative.  Physical Exam Updated Vital Signs BP (!) 137/95   Pulse 81   Temp 98.2 F (36.8 C)   Resp (!) 24   Ht 5' 11.5" (1.816 m)   Wt 98.4 kg   SpO2 98%   BMI 29.84 kg/m   Physical Exam Vitals and nursing note reviewed.  Constitutional:      General: He is not in acute distress.    Appearance: He is well-developed. He is obese. He is not ill-appearing, toxic-appearing or diaphoretic.  HENT:     Head: Normocephalic and atraumatic.     Right Ear: External ear normal.     Left Ear: External ear normal.     Nose: Nose normal.     Mouth/Throat:     Mouth: Mucous membranes are moist.     Pharynx: Oropharynx is clear.  Eyes:     General: No scleral icterus.    Extraocular Movements: Extraocular movements intact.     Conjunctiva/sclera: Conjunctivae normal.     Pupils: Pupils are equal, round, and reactive to light.  Neck:     Vascular: No JVD.  Cardiovascular:     Rate and Rhythm: Normal rate and regular rhythm.     Pulses: Normal pulses.     Heart sounds: Normal heart sounds, S1 normal and S2  normal. No murmur heard. Pulmonary:     Effort: Pulmonary effort is normal. No respiratory distress.     Breath sounds: Normal breath sounds.  Abdominal:     General: Bowel sounds are normal. There is no distension.     Palpations: Abdomen is soft.     Tenderness: There is no abdominal tenderness.  Musculoskeletal:        General: No swelling.     Cervical back: Normal range of motion and neck supple. No rigidity or tenderness.     Right lower leg: No edema.     Left lower leg: No edema.  Lymphadenopathy:     Cervical: No cervical adenopathy.  Skin:    General: Skin is warm and dry.     Capillary Refill: Capillary refill takes less than 2 seconds.  Neurological:     General: No focal deficit present.     Mental Status: He is alert and oriented to person, place, and time. Mental status is at baseline.     GCS: GCS eye subscore is 4. GCS verbal subscore is 5. GCS motor subscore is 6.  Psychiatric:        Mood and Affect: Mood normal.        Behavior: Behavior normal. Behavior is cooperative.    ED Results / Procedures / Treatments   Labs (all labs ordered are listed, but only abnormal results are displayed) Labs Reviewed  BASIC METABOLIC PANEL - Abnormal; Notable for the following components:      Result Value   Glucose, Bld 145 (*)    Creatinine, Ser 1.30 (*)    Calcium 8.8 (*)    GFR, Estimated 58 (*)    All other components within normal limits  CBC - Abnormal; Notable for the following components:   WBC 15.7 (*)    All other components within normal limits  TROPONIN I (HIGH SENSITIVITY) - Abnormal; Notable for the following components:   Troponin I (High Sensitivity) 1,896 (*)    All other components within normal limits  TROPONIN I (HIGH SENSITIVITY) - Abnormal; Notable for the following components:  Troponin I (High Sensitivity) 2,068 (*)    All other components within normal limits    EKG None  Radiology DG Chest 2 View  Result Date: 08/06/2021 CLINICAL  DATA:  Chest and left arm pain.  Ablation yesterday. EXAM: CHEST - 2 VIEW COMPARISON:  10/28/2017 FINDINGS: Heart size upper limits of normal. Aortic atherosclerotic calcification is noted. Mild atelectasis at the left lung base. Lungs are otherwise clear. No effusions. No significant bone finding. IMPRESSION: Mild atelectasis at the left lung base. Electronically Signed   By: Nelson Chimes M.D.   On: 08/06/2021 14:12   CT Angio Chest PE W and/or Wo Contrast  Result Date: 08/06/2021 CLINICAL DATA:  Chest pain and difficulty breathing. EXAM: CT ANGIOGRAPHY CHEST WITH CONTRAST TECHNIQUE: Multidetector CT imaging of the chest was performed using the standard protocol during bolus administration of intravenous contrast. Multiplanar CT image reconstructions and MIPs were obtained to evaluate the vascular anatomy. CONTRAST:  25mL OMNIPAQUE IOHEXOL 350 MG/ML SOLN COMPARISON:  July 29, 2021 FINDINGS: Cardiovascular: There is moderate severity calcification of the aortic arch, without evidence of aortic aneurysm. Satisfactory opacification of the pulmonary arteries to the segmental level. No evidence of pulmonary embolism. Normal heart size with moderate severity coronary artery calcification. No pericardial effusion. Mediastinum/Nodes: No enlarged mediastinal, hilar, or axillary lymph nodes. Thyroid gland, trachea, and esophagus demonstrate no significant findings. Lungs/Pleura: Very mild atelectasis is seen within the bilateral lung bases. There is no evidence of acute infiltrate, pleural effusion or pneumothorax. Upper Abdomen: No acute abnormality. Musculoskeletal: Multilevel degenerative changes seen throughout the thoracic spine. Review of the MIP images confirms the above findings. IMPRESSION: 1. No evidence of pulmonary embolus or acute cardiopulmonary disease. 2. Moderate severity coronary artery disease. 3. Aortic atherosclerosis. Aortic Atherosclerosis (ICD10-I70.0). Electronically Signed   By: Virgina Norfolk M.D.   On: 08/06/2021 18:58   EP STUDY  Result Date: 08/05/2021 SURGEON:  Allegra Lai, MD PREPROCEDURE DIAGNOSES: 1. Paroxysmal atrial fibrillation. 2. Typical appearing atrial flutter POSTPROCEDURE DIAGNOSES: 1. Paroxysmal  atrial fibrillation. 2. Typical appearing atrial flutter PROCEDURES: 1. Comprehensive electrophysiologic study. 2. Coronary sinus pacing and recording. 3. Three-dimensional mapping of atrial fibrillation with additional mapping and ablation of a second discrete focus (atrial flutter) 4. Ablation of atrial fibrillation with additional mapping and ablation of a second discrete focus (atrial flutter) 5. Intracardiac echocardiography. 6. Transseptal puncture of an intact septum. 7. Arrhythmia induction with pacing with isuprel infusion INTRODUCTION:  Jimmy Bradshaw is a 75 y.o. male with a history of paroxysmal atrial fibrillation and typical appearing atrial flutter who now presents for EP study and radiofrequency ablation.  The patient reports initially being diagnosed with atrial fibrillation after presenting with symptomatic palpitations and fatgiue. The patient reports increasing frequency and duration of atrial arrhythmias since that time.  The patient therefore presents today for catheter ablation of atrial fibrillation and atrial flutter. DESCRIPTION OF PROCEDURE:  Informed written consent was obtained, and the patient was brought to the electrophysiology lab in a fasting state.  The patient was adequately sedated with intravenous medications as outlined in the anesthesia report.  The patient's left and right groins were prepped and draped in the usual sterile fashion by the EP lab staff.  Using a percutaneous Seldinger technique, two 8-French hemostasis sheaths were placed in the right common femoral vein; one 7-French and one 11-French hemostasis sheaths were placed into the left common femoral vein.  An esophageal temperature probe was inserted to monitor for heating of the  esophagus  during the procedure. Direct ultrasound guidance is used for right and left femoral veins with normal vessel patency. Ultrasound images are captured and stored in the patient's chart. Using ultrasound guidance, the Brockenbrough needle and wire were visualized entering the vessel. Catheter Placement:  A 7-French Biosense Webster Decapolar coronary sinus catheter was introduced through the right common femoral vein and advanced into the coronary sinus for recording and pacing from this location.  A luminal esophageal temperature probe was placed and used for continuous monitoring of the luminal esophageal temperature throughout the procedure as well as to localize the esophagus on fluoroscopy. In addition, the esophagus was directly visualized with intracardiac echo and its positioned marked on Carto.  During ablation at the posterior wall there was limited esophageal heating noted during RF energy delivery with the maximal temperature recorded by the luminal temperature probe of < 38.5 degrees C. Initial Measurements: The patient presented to the electrophysiology lab in atrial flutter.  The patients QRS duration of 87 msec and a QT interval of 313 msec.  Intracardiac Echocardiography: A 10-French Biosense Webster AcuNav intracardiac echocardiography catheter was introduced through the right common femoral vein and advanced into the right atrium. Intracardiac echocardiography was performed of the left atrium, and a three-dimensional anatomical rendering of the left atrium was performed using CARTO sound technology.  The patient was noted to have a moderate sized left atrium.  The interatrial septum was prominent but not aneurysmal. All 4 pulmonary veins were visualized and noted to have separate ostia.  The pulmonary veins were moderate in size.  The left atrial appendage was visualized and did not reveal thrombus.   There was no evidence of pulmonary vein stenosis. Transseptal Puncture: The right common  femoral vein sheaths were was exchanged for an 8.5 [place] Agillis transseptal sheath and transseptal access was achieved in a standard fashion using a Brockenbrough needle under biplane fluoroscopy with intracardiac echocardiography confirmation of the transseptal puncture.  Once transseptal access had been achieved, heparin was administered intravenously and intra- arterially in order to maintain an ACT of greater than 350 seconds throughout the procedure. 3D Mapping and Ablation: A 3.5 mm Biosense Webster SmartTouch Thermocool ablation catheter was advanced into the right atrium.  The transseptal sheath was pulled back into the IVC over a guidewire.  The ablation catheter was advanced across the transseptal hole using the wire as a guide.  The transseptal sheath was then re-advanced over the guidewire into the left atrium.  A duodecapolar Biosense Webster pentarray mapping catheter was introduced through the transseptal sheath and positioned over the mouth of all 4 pulmonary veins.  Three-dimensional electroanatomical mapping was performed using CARTO technology.  This demonstrated electrical activity within all four pulmonary veins at baseline. The patient underwent successful sequential electrical isolation and anatomical encircling of all four pulmonary veins using radiofrequency current with a circular mapping catheter as a guide.  A WACA approach was used.  Entrance and exit block were achieved. The ablation catheter was then pulled back into the right atrial and positioned along the cavo-tricuspid isthmus.  Mapping along the atrial side of the isthmus was performed.  This demonstrated a standard isthmus.  A series of radiofrequency applications were then delivered along the isthmus.  Complete bidirectional cavotricuspid isthmus block was achieved as confirmed by differential atrial pacing from the low lateral right atrium.  A stimulus to earliest atrial activation across the isthmus measured 170 msec  bi-directionally.  The patient was observe without return of conduction through the isthmus. Measurements  Following Ablation: Following ablation, dobutamine was infused up to 20 mcg/min with no inducible atrial fibrillation, atrial tachycardia, atrial flutter, or sustained PACs. In junctional rhythm with RR interval was 865 msec, with PR 162 msec, QRS 100 msec, and QT 444 msec.  Following ablation the HV interval of 64 msec. Ventricular pacing was performed, which revealed midline concentric decremental VA conduction with a VA WCL of 530 msec. Rapid atrial pacing was performed, which revealed an AV Wenckebach cycle length of 340 msec.  Electroisolation was then again confirmed in all four pulmonary veins. Intracardiac echocardiography was again performed, which revealed no pericardial effusion.  The procedure was therefore considered completed.  All catheters were removed, and the sheaths were aspirated and flushed.  The patient was transferred to the recovery area for sheath removal per protocol.  Intracardiac echocardiogram revealed no pericardial effusion. EBL<30ml.  There were no early apparent complications. CONCLUSIONS: 1. Sinus rhythm upon presentation.  2. Successful electrical isolation and anatomical encircling of all four pulmonary veins with radiofrequency current.   3. Cavo-tricuspid isthmus ablation was performed with complete bidirectional isthmus block achieved. 4. No inducible arrhythmias following ablation both on and off of dobutamine 5. No early apparent complications. Will Camnitz,MD 10:55 AM 08/05/2021   VAS [place] LOWER EXTREMITY VENOUS (DVT) (7a-7p)  Result Date: 08/06/2021  Lower Venous DVT Study Patient Name:  Jimmy Bradshaw  Date of Exam:   08/06/2021 Medical Rec #: [id number]       Accession #:    [id number] Date of Birth: 09-21-1945       Patient Gender: M Patient Age:   57 years Exam [facility]  [facility] Procedure:      VAS [place] LOWER EXTREMITY VENOUS (DVT) Referring Phys: Marijean Bravo  COUTURE --------------------------------------------------------------------------------  Indications: Left thigh pain, s/p left femoral access for cardiac ablation.  Comparison Study: No prior study Performing Technologist: Maudry Mayhew MHA, RDMS, RVT, RDCS  Examination Guidelines: A complete evaluation includes B-mode imaging, spectral Doppler, color Doppler, and power Doppler as needed of all accessible portions of each vessel. Bilateral testing is considered an integral part of a complete examination. Limited examinations for reoccurring indications may be performed as noted. The reflux portion of the exam is performed with the patient in reverse Trendelenburg.  +-----+---------------+---------+-----------+----------+--------------+ RIGHTCompressibilityPhasicitySpontaneityPropertiesThrombus Aging +-----+---------------+---------+-----------+----------+--------------+ CFV  Full           Yes      Yes                                 +-----+---------------+---------+-----------+----------+--------------+   +---------+---------------+---------+-----------+----------+--------------+ LEFT     CompressibilityPhasicitySpontaneityPropertiesThrombus Aging +---------+---------------+---------+-----------+----------+--------------+ CFV      Full           Yes      Yes                                 +---------+---------------+---------+-----------+----------+--------------+ FV Prox  Full                                                        +---------+---------------+---------+-----------+----------+--------------+ FV Mid   Full                                                        +---------+---------------+---------+-----------+----------+--------------+  FV DistalFull                                                        +---------+---------------+---------+-----------+----------+--------------+ PFV      Full                                                         +---------+---------------+---------+-----------+----------+--------------+ POP      Full           Yes      Yes                                 +---------+---------------+---------+-----------+----------+--------------+ PTV      Full                                                        +---------+---------------+---------+-----------+----------+--------------+ PERO     Full                                                        +---------+---------------+---------+-----------+----------+--------------+     Summary: RIGHT: - No evidence of common femoral vein obstruction.  LEFT: - There is no evidence of deep vein thrombosis in the lower extremity.  - No cystic structure found in the popliteal fossa.  *See table(s) above for measurements and observations. Electronically signed by Orlie Pollen on 08/06/2021 at 7:09:49 PM.    Final     Procedures Procedures   Medications Ordered in ED Medications  nitroGLYCERIN (NITROSTAT) SL tablet 0.4 mg (0.4 mg Sublingual Given 08/06/21 1805)  iohexol (OMNIPAQUE) 350 MG/ML injection 80 mL (80 mLs Intravenous Contrast Given 08/06/21 1850)    ED Course  I have reviewed the triage vital signs and the nursing notes.  Pertinent labs & imaging results that were available during my care of the patient were reviewed by me and considered in my medical decision making (see chart for details).    MDM Rules/Calculators/A&P HEAR Score: 6                        Jimmy Bradshaw is a 75 y.o. male presenting with CP. Initial VS wnl. HEAR score 6.   EKG interpretation: NSR, rate 74 bpm, prolonged PR and QT intervals, nonspecific T wave changes, no ST elevations or depressions.  No significant change from prior..  Labs: Leukocytosis of 15.7.  Elevated creatinine near prior baseline.  Troponin of 1896, repeat 2068.  Imaging: CXR with mild atelectasis in the left lung base, otherwise unremarkable.  CTA negative for PE and LE Doppler negative for  DVT.  Outside ultrasound negative for pericardial effusion, McConnell sign, or wall motion abnormalities.  Imaging was reviewed by radiology and personally by me.  DDX considered: DVT, PE, ACS,  postoperative complication, pneumonia, pneumothorax, myocarditis. History, examination, and objective data most consistent with likely pericarditis, given pleuritic nature and recent procedure.  No acute cardiopulmonary abnormalities noted, and bedside ultrasound inconsistent with large effusion.  Uptrending troponin likely secondary to postop, low suspicion for ACS given overall well appearance.  No systemic infectious signs or symptoms.  Imaging negative for blood clots patient reports compliance on blood thinners.  Nitro did not relieve symptoms, lowering suspicion for ACS.  Medications: Medications  nitroGLYCERIN (NITROSTAT) SL tablet 0.4 mg (0.4 mg Sublingual Given 08/06/21 1805)  iohexol (OMNIPAQUE) 350 MG/ML injection 80 mL (80 mLs Intravenous Contrast Given 08/06/21 1850)    Cardiology consulted, agree with pericarditis as likely source.  Re-evaluated prior to discharge. Hemodynamically stable and in no acute distress.  Discharged home in stable condition. Strict ED return precautions advised.  Rx colchicine x5 days.  Patient will [facility] as scheduled.  Will continue normal home medications.  Supportive care discussed. Outpatient PCP follow-up advised. Patient understands and agrees with the plan.  The plan for this patient was discussed with my attending physician, who voiced agreement and who oversaw evaluation and treatment of this patient.     Note: Estate manager/land agent was used in the creation of this note.  Final Clinical Impression(s) / ED Diagnoses Final diagnoses:  Precordial chest pain    Rx / DC Orders ED Discharge Orders          Ordered    colchicine 0.6 MG tablet  2 times daily,   Status:  Discontinued        08/06/21 2003     colchicine 0.6 MG tablet  2 times daily,   Status:  Discontinued        08/06/21 2019    colchicine 0.6 MG tablet  2 times daily        08/06/21 2019             [place], [place], DO 08/06/21 2020    Margette Fast, MD 08/08/21 2359

## 2021-08-06 NOTE — Anesthesia Postprocedure Evaluation (Signed)
Anesthesia Post Note  Patient: Jimmy Bradshaw  Procedure(s) Performed: ATRIAL FIBRILLATION ABLATION     Patient location during evaluation: Cath Lab Anesthesia Type: General Level of consciousness: awake and alert Pain management: pain level controlled Vital Signs Assessment: post-procedure vital signs reviewed and stable Respiratory status: spontaneous breathing, nonlabored ventilation, respiratory function stable and patient connected to nasal cannula oxygen Cardiovascular status: blood pressure returned to baseline and stable Postop Assessment: no apparent nausea or vomiting Anesthetic complications: no   There were no known notable events for this encounter.  Last Vitals:  Vitals:   08/05/21 1400 08/05/21 1450  BP: 122/75 130/79  Pulse: 80 78  Resp: 19 19  Temp:    SpO2: 98% 98%    Last Pain:  Vitals:   08/05/21 1200  TempSrc:   PainSc: 0-No pain                 Venicia Vandall

## 2021-08-06 NOTE — ED Notes (Signed)
Patient placed on 2L [place] for comfort and repositioned in bed sitting up.

## 2021-08-06 NOTE — ED Triage Notes (Signed)
Patient here for evaluation of chest pain and shortness of breath that started at approximately 1230 today. Patient states he had a heart ablation yesterday to treat afib/aflutter and was told to skip one day of eliquis for that procedure. Patient complains of left arm pain and chest tightness.

## 2021-08-06 NOTE — Consult Note (Signed)
Cardiology Consultation:   Patient ID: Jimmy Bradshaw MRN: [id number]; DOB: 30-Nov-1945  Admit date: 08/06/2021 Date of Consult: 08/06/2021  PCP:  Susy Frizzle, MD   [facility] Providers Cardiologist:  Mertie Moores, MD        Patient Profile:   Jimmy Bradshaw is a 75 y.o. male with a hx of Aflutter s/p ablation on 08/05/21, HTN, HLD and DMII who is being seen 08/06/2021 for the evaluation of pleuritic chest pain at the request of Dr. Laverta Baltimore.  History of Present Illness:   Mr. Marte is a 75 year old male with the history detailed above who underwent Aflutter ablation with Dr. Curt Bears yesterday. The procedure was uncomplicated and he was discharged home. Today, he developed substernal chest pressure that was worse with deep inspiration and with laying flat. Had some associated shortness of breath/difficulty taking deep breaths prompting him to come to the ER.  In the ER trop 1800>2000 (s/p ablation). ECG with NSR with nonspecific ST-T wave changes but no STD/STE. Bedside TTE performed by ER without evidence of effusion. CXR with left atelectasis but no acute pathology.   Currently, the patient complains of pleuritic chest pain that worsens with laying flat. No LE edema, orthopnea, PND. Myoview in 2018 low risk.    Past Medical History:  Diagnosis Date   Anxiety    Arthritis    HANDS,  SHOULDERS   Bladder neck obstruction    BPH (benign prostatic hypertrophy)    Frequency of urination    H/O: gout    History of colon polyps    Hyperlipidemia    Nocturia    PAF (paroxysmal atrial fibrillation) ([place])    Type 2 diabetes mellitus ([place])    diet controlled   Urgency of urination    Wears hearing aid    BILATERAL    Past Surgical History:  Procedure Laterality Date   ATRIAL FIBRILLATION ABLATION N/A 08/05/2021   Procedure: ATRIAL FIBRILLATION ABLATION;  Surgeon: Constance Haw, MD;  Location: [place] [facility];  Service: Cardiovascular;  Laterality: N/A;    CARDIOVERSION N/A 10/26/2016   Procedure: CARDIOVERSION;  Surgeon: Larey Dresser, MD;  Location: [place];  Service: Cardiovascular;  Laterality: N/A;   CARDIOVERSION N/A 04/17/2019   Procedure: CARDIOVERSION;  Surgeon: Sueanne Margarita, MD;  Location: [facility];  Service: Cardiovascular;  Laterality: N/A;   COLONOSCOPY  08-30-2011, 2017   I & D KNEE WITH POLY EXCHANGE Left 10/08/2014   Procedure: IRRIGATION AND DEBRIDEMENT LEFT UNI KNEE ARTHOPLASTY  WITH POLY EXCHANGE;  Surgeon: Mauri Pole, MD;  Location: [facility];  Service: Orthopedics;  Laterality: Left;   KNEE ARTHROSCOPY Left 11/ 2013   PARTIAL KNEE ARTHROPLASTY Left 02/05/2013   Procedure: LEFT KNEE MEDIAL UNICOMPARTMENTAL KNEE;  Surgeon: Mauri Pole, MD;  Location: [facility];  Service: Orthopedics;  Laterality: Left;   PICC LINE INSERTION     and removed later   TRANSURETHRAL RESECTION OF PROSTATE N/A 09/30/2014   Procedure: TRANSURETHRAL RESECTION OF THE PROSTATE WITH GYRUS INSTRUMENTS;  Surgeon: Bernestine Amass, MD;  Location[facility];  Service: Urology;  Laterality: N/A;   TRANSURETHRAL RESECTION OF PROSTATE N/A 07/03/2020   Procedure: TRANSURETHRAL RESECTION OF THE PROSTATE (TURP);  Surgeon: Franchot Gallo, MD;  Location: [facility];  Service: Urology;  Laterality: N/A;  1 HR     Home Medications:  Prior to Admission medications   Medication Sig Start Date End Date Taking? Authorizing Provider  allopurinol (ZYLOPRIM) 100 MG tablet Take 1 tablet (100 mg total) by mouth daily. Patient taking differently: Take 400 mg by mouth daily. 03/27/21  Yes Susy Frizzle, MD  apixaban (ELIQUIS) 5 MG TABS tablet Take 1 tablet (5 mg total) by mouth 2 (two) times daily. 02/09/21  Yes Nahser, Wonda Cheng, MD  buPROPion (WELLBUTRIN XL) 300 MG 24 hr tablet Take 1 tablet (300 mg total) by mouth daily. 03/05/21  Yes Susy Frizzle, MD  flecainide (TAMBOCOR) 50 MG tablet Take 1 tablet (50 mg total) by mouth 2  (two) times daily. 01/14/21  Yes Nahser, Wonda Cheng, MD  lisinopril (ZESTRIL) 2.5 MG tablet Take 1 tablet (2.5 mg total) by mouth daily. 07/30/21  Yes Susy Frizzle, MD  metoprolol succinate (TOPROL-XL) 25 MG 24 hr tablet TAKE 1 TABLET EVERY DAY 06/22/21  Yes Nahser, Wonda Cheng, MD  rosuvastatin (CRESTOR) 10 MG tablet Take 1 tablet (10 mg total) by mouth daily. Patient taking differently: Take 10 mg by mouth at bedtime. 04/10/21  Yes Susy Frizzle, MD  allopurinol (ZYLOPRIM) 300 MG tablet Take 1 tablet (300 mg total) by mouth daily. Patient not taking: Reported on 08/06/2021 03/27/21   Susy Frizzle, MD  MITIGARE 0.6 MG CAPS Take 1 cap (0.6 mg total) by mouth daily as needed. May repeat in 2 hours Patient not taking: Reported on 08/06/2021 05/08/20   Alycia Rossetti, MD    Inpatient Medications: Scheduled Meds:  Continuous Infusions:  PRN Meds: nitroGLYCERIN  Allergies:    Allergies  Allergen Reactions   Pravastatin Other (See Comments)    Headache and legs hurt   Penicillins Rash    Reaction as a child. Tolerated a penicillin shot w/o reaction 2018    Social History:   Social History   Socioeconomic History   Marital status: Married    Spouse name: Not on file   Number of children: Not on file   Years of education: Not on file   Highest education level: Not on file  Occupational History   Not on file  Tobacco Use   Smoking status: Former    Packs/day: 1.50    Years: 25.00    Pack years: 37.50    Types: Cigarettes    Quit date: 12/08/1998    Years since quitting: 22.6   Smokeless tobacco: Never  Vaping Use   Vaping Use: Never used  Substance and Sexual Activity   Alcohol use: Yes    Alcohol/week: 1.0 standard drink    Types: 1 Glasses of wine per week    Comment: RARE - a sip, occassional   Drug use: No   Sexual activity: Not Currently  Other Topics Concern   Not on file  Social History Narrative   Not on file   Social Determinants of Health    Financial Resource Strain: Not on file  Food Insecurity: Not on file  Transportation Needs: Not on file  Physical Activity: Not on file  Stress: Not on file  Social Connections: Not on file  Intimate Partner Violence: Not on file    Family History:    Family History  Adopted: Yes  Problem Relation Age of Onset   Colon cancer Father      ROS:  Please see the history of present illness.  Review of Systems  Constitutional:  Positive for malaise/fatigue. Negative for fever.  HENT:  Negative for sore throat.   Eyes:  Negative for blurred vision.  Respiratory:  Positive for shortness  of breath.   Cardiovascular:  Positive for chest pain. Negative for palpitations, orthopnea, claudication, leg swelling and PND.  Gastrointestinal:  Negative for nausea and vomiting.  Genitourinary:  Negative for flank pain.  Musculoskeletal:  Negative for falls.  Neurological:  Negative for loss of consciousness.   All other ROS reviewed and negative.     Physical Exam/Data:   Vitals:   08/06/21 1630 08/06/21 1700 08/06/21 1730 08/06/21 1800  BP: 115/71 (!) 146/79 129/80 117/78  Pulse: 81 80 78 82  Resp: (!) 29 19 (!) 28 (!) 30  Temp:      SpO2: 99% 98% 99% 98%  Weight:      Height:       No intake or output data in the 24 hours ending 08/06/21 1853 Last 3 Weights 08/06/2021 08/05/2021 06/02/2021  Weight (lbs) 217 lb 217 lb 218 lb  Weight (kg) 98.431 kg 98.431 kg 98.884 kg     Body mass index is 29.84 kg/m.  General:  Comfortable, NAD HEENT: normal Neck: no JVD Vascular: No carotid bruits; Distal pulses 2+ bilaterally Cardiac:  RR, no murmur Lungs:  clear to auscultation bilaterally, no wheezing, rhonchi or rales  Abd: soft, nontender, no hepatomegaly  Ext: no edema Musculoskeletal:  No deformities, BUE and BLE strength normal and equal Skin: warm and dry  Neuro:  CNs 2-12 intact, no focal abnormalities noted Psych:  Normal affect   EKG:  The EKG was personally reviewed and  demonstrates:  NSR, nonspecific ST-T wave changes Telemetry:  Telemetry was personally reviewed and demonstrates:  NSR   Relevant CV Studies: TTE 11/2018: IMPRESSIONS     1. The left ventricle has normal systolic function with an ejection  fraction of 60-65%. The cavity size was normal. Left ventricular diastolic  parameters were normal.   2. The right ventricle has normal systolic function. The cavity was  normal. There is no increase in right ventricular wall thickness.   3. Left atrial size was mildly dilated.   4. The mitral valve is normal in structure. There is mild mitral annular  calcification present.   5. The tricuspid valve is normal in structure.   6. The aortic valve is tricuspid Moderate thickening of the aortic valve  Moderate calcification of the aortic valve.   7. The pulmonic valve was normal in structure.   Exercise Nuc 2018: The left ventricular ejection fraction is mildly decreased (45-54%). Nuclear stress EF: 49%. Blood pressure demonstrated a normal response to exercise. Nonspecific ST changes noted during stress with significant wandering baseline artifact noted There is a small defect of mild severity present in the apical anterior location. The defect is non-reversible. No ischemia noted. This is a low risk study.    Laboratory Data:  High Sensitivity Troponin:   Recent Labs  Lab 08/06/21 1349 08/06/21 1611  TROPONINIHS 1,896* 2,068*     Chemistry Recent Labs  Lab 08/06/21 1349  NA 136  K 3.6  CL 104  CO2 22  GLUCOSE 145*  BUN 19  CREATININE 1.30*  CALCIUM 8.8*  GFRNONAA 58*  ANIONGAP 10    No results for input(s): PROT, ALBUMIN, AST, ALT, ALKPHOS, BILITOT in the last 168 hours. Lipids No results for input(s): CHOL, TRIG, HDL, LABVLDL, LDLCALC, CHOLHDL in the last 168 hours.  Hematology Recent Labs  Lab 08/06/21 1349  WBC 15.7*  RBC 4.23  HGB 13.0  HCT 40.7  MCV 96.2  MCH 30.7  MCHC 31.9  RDW 14.0  PLT 188  Thyroid No  results for input(s): TSH, FREET4 in the last 168 hours.  BNPNo results for input(s): BNP, PROBNP in the last 168 hours.  DDimer No results for input(s): DDIMER in the last 168 hours.   Radiology/Studies:  DG Chest 2 View  Result Date: 08/06/2021 CLINICAL DATA:  Chest and left arm pain.  Ablation yesterday. EXAM: CHEST - 2 VIEW COMPARISON:  10/28/2017 FINDINGS: Heart size upper limits of normal. Aortic atherosclerotic calcification is noted. Mild atelectasis at the left lung base. Lungs are otherwise clear. No effusions. No significant bone finding. IMPRESSION: Mild atelectasis at the left lung base. Electronically Signed   By: Nelson Chimes M.D.   On: 08/06/2021 14:12   EP STUDY  Result Date: 08/05/2021 SURGEON:  Allegra Lai, MD PREPROCEDURE DIAGNOSES: 1. Paroxysmal atrial fibrillation. 2. Typical appearing atrial flutter POSTPROCEDURE DIAGNOSES: 1. Paroxysmal  atrial fibrillation. 2. Typical appearing atrial flutter PROCEDURES: 1. Comprehensive electrophysiologic study. 2. Coronary sinus pacing and recording. 3. Three-dimensional mapping of atrial fibrillation with additional mapping and ablation of a second discrete focus (atrial flutter) 4. Ablation of atrial fibrillation with additional mapping and ablation of a second discrete focus (atrial flutter) 5. Intracardiac echocardiography. 6. Transseptal puncture of an intact septum. 7. Arrhythmia induction with pacing with isuprel infusion INTRODUCTION:  Jimmy Bradshaw is a 75 y.o. male with a history of paroxysmal atrial fibrillation and typical appearing atrial flutter who now presents for EP study and radiofrequency ablation.  The patient reports initially being diagnosed with atrial fibrillation after presenting with symptomatic palpitations and fatgiue. The patient reports increasing frequency and duration of atrial arrhythmias since that time.  The patient therefore presents today for catheter ablation of atrial fibrillation and atrial flutter.  DESCRIPTION OF PROCEDURE:  Informed written consent was obtained, and the patient was brought to the electrophysiology lab in a fasting state.  The patient was adequately sedated with intravenous medications as outlined in the anesthesia report.  The patient's left and right groins were prepped and draped in the usual sterile fashion by the EP lab staff.  Using a percutaneous Seldinger technique, two 8-French hemostasis sheaths were placed in the right common femoral vein; one 7-French and one 11-French hemostasis sheaths were placed into the left common femoral vein.  An esophageal temperature probe was inserted to monitor for heating of the esophagus during the procedure. Direct ultrasound guidance is used for right and left femoral veins with normal vessel patency. Ultrasound images are captured and stored in the patient's chart. Using ultrasound guidance, the Brockenbrough needle and wire were visualized entering the vessel. Catheter Placement:  A 7-French Biosense Webster Decapolar coronary sinus catheter was introduced through the right common femoral vein and advanced into the coronary sinus for recording and pacing from this location.  A luminal esophageal temperature probe was placed and used for continuous monitoring of the luminal esophageal temperature throughout the procedure as well as to localize the esophagus on fluoroscopy. In addition, the esophagus was directly visualized with intracardiac echo and its positioned marked on Carto.  During ablation at the posterior wall there was limited esophageal heating noted during RF energy delivery with the maximal temperature recorded by the luminal temperature probe of < 38.5 degrees C. Initial Measurements: The patient presented to the electrophysiology lab in atrial flutter.  The patients QRS duration of 87 msec and a QT interval of 313 msec.  Intracardiac Echocardiography: A 10-French Biosense Webster AcuNav intracardiac echocardiography catheter was  introduced through the right common femoral vein and  advanced into the right atrium. Intracardiac echocardiography was performed of the left atrium, and a three-dimensional anatomical rendering of the left atrium was performed using CARTO sound technology.  The patient was noted to have a moderate sized left atrium.  The interatrial septum was prominent but not aneurysmal. All 4 pulmonary veins were visualized and noted to have separate ostia.  The pulmonary veins were moderate in size.  The left atrial appendage was visualized and did not reveal thrombus.   There was no evidence of pulmonary vein stenosis. Transseptal Puncture: The right common femoral vein sheaths were was exchanged for an 8.5 [place] Agillis transseptal sheath and transseptal access was achieved in a standard fashion using a Brockenbrough needle under biplane fluoroscopy with intracardiac echocardiography confirmation of the transseptal puncture.  Once transseptal access had been achieved, heparin was administered intravenously and intra- arterially in order to maintain an ACT of greater than 350 seconds throughout the procedure. 3D Mapping and Ablation: A 3.5 mm Biosense Webster SmartTouch Thermocool ablation catheter was advanced into the right atrium.  The transseptal sheath was pulled back into the IVC over a guidewire.  The ablation catheter was advanced across the transseptal hole using the wire as a guide.  The transseptal sheath was then re-advanced over the guidewire into the left atrium.  A duodecapolar Biosense Webster pentarray mapping catheter was introduced through the transseptal sheath and positioned over the mouth of all 4 pulmonary veins.  Three-dimensional electroanatomical mapping was performed using CARTO technology.  This demonstrated electrical activity within all four pulmonary veins at baseline. The patient underwent successful sequential electrical isolation and anatomical encircling of all four pulmonary veins using  radiofrequency current with a circular mapping catheter as a guide.  A WACA approach was used.  Entrance and exit block were achieved. The ablation catheter was then pulled back into the right atrial and positioned along the cavo-tricuspid isthmus.  Mapping along the atrial side of the isthmus was performed.  This demonstrated a standard isthmus.  A series of radiofrequency applications were then delivered along the isthmus.  Complete bidirectional cavotricuspid isthmus block was achieved as confirmed by differential atrial pacing from the low lateral right atrium.  A stimulus to earliest atrial activation across the isthmus measured 170 msec bi-directionally.  The patient was observe without return of conduction through the isthmus. Measurements Following Ablation: Following ablation, dobutamine was infused up to 20 mcg/min with no inducible atrial fibrillation, atrial tachycardia, atrial flutter, or sustained PACs. In junctional rhythm with RR interval was 865 msec, with PR 162 msec, QRS 100 msec, and QT 444 msec.  Following ablation the HV interval of 64 msec. Ventricular pacing was performed, which revealed midline concentric decremental VA conduction with a VA WCL of 530 msec. Rapid atrial pacing was performed, which revealed an AV Wenckebach cycle length of 340 msec.  Electroisolation was then again confirmed in all four pulmonary veins. Intracardiac echocardiography was again performed, which revealed no pericardial effusion.  The procedure was therefore considered completed.  All catheters were removed, and the sheaths were aspirated and flushed.  The patient was transferred to the recovery area for sheath removal per protocol.  Intracardiac echocardiogram revealed no pericardial effusion. EBL<65ml.  There were no early apparent complications. CONCLUSIONS: 1. Sinus rhythm upon presentation.  2. Successful electrical isolation and anatomical encircling of all four pulmonary veins with radiofrequency current.    3. Cavo-tricuspid isthmus ablation was performed with complete bidirectional isthmus block achieved. 4. No inducible arrhythmias following ablation both on  and off of dobutamine 5. No early apparent complications. Will Camnitz,MD 10:55 AM 08/05/2021   VAS [place] LOWER EXTREMITY VENOUS (DVT) (7a-7p)  Result Date: 08/06/2021  Lower Venous DVT Study Patient Name:  MUAZ SHOREY Grabill  Date of Exam:   08/06/2021 Medical Rec #: [id number]       Accession #:    [id number] Date of Birth: 1946-04-05       Patient Gender: M Patient Age:   24 years Exam Location:  [facility] Procedure:      VAS [place] LOWER EXTREMITY VENOUS (DVT) Referring Phys: Marijean Bravo COUTURE --------------------------------------------------------------------------------  Indications: Left thigh pain, s/p left femoral access for cardiac ablation.  Comparison Study: No prior study Performing Technologist: Maudry Mayhew MHA, RDMS, RVT, RDCS  Examination Guidelines: A complete evaluation includes B-mode imaging, spectral Doppler, color Doppler, and power Doppler as needed of all accessible portions of each vessel. Bilateral testing is considered an integral part of a complete examination. Limited examinations for reoccurring indications may be performed as noted. The reflux portion of the exam is performed with the patient in reverse Trendelenburg.  +-----+---------------+---------+-----------+----------+--------------+ RIGHTCompressibilityPhasicitySpontaneityPropertiesThrombus Aging +-----+---------------+---------+-----------+----------+--------------+ CFV  Full           Yes      Yes                                 +-----+---------------+---------+-----------+----------+--------------+   +---------+---------------+---------+-----------+----------+--------------+ LEFT     CompressibilityPhasicitySpontaneityPropertiesThrombus Aging +---------+---------------+---------+-----------+----------+--------------+ CFV      Full            Yes      Yes                                 +---------+---------------+---------+-----------+----------+--------------+ FV Prox  Full                                                        +---------+---------------+---------+-----------+----------+--------------+ FV Mid   Full                                                        +---------+---------------+---------+-----------+----------+--------------+ FV DistalFull                                                        +---------+---------------+---------+-----------+----------+--------------+ PFV      Full                                                        +---------+---------------+---------+-----------+----------+--------------+ POP      Full           Yes      Yes                                 +---------+---------------+---------+-----------+----------+--------------+  PTV      Full                                                        +---------+---------------+---------+-----------+----------+--------------+ PERO     Full                                                        +---------+---------------+---------+-----------+----------+--------------+     Summary: RIGHT: - No evidence of common femoral vein obstruction.  LEFT: - There is no evidence of deep vein thrombosis in the lower extremity.  - No cystic structure found in the popliteal fossa.  *See table(s) above for measurements and observations.    Preliminary      Assessment and Plan:   #Post-Ablation Pericarditis: #Chest Pain: Patient presents with pleuritic/positional chest pain after Afib ablation yesterday consistent with pericarditis. Trop elevated which is expected post-ablation and ECG/chest pain not consistent with ACS. Bedside POC ultrasound per ER without effusion. CXR without acute pathology. Recommend colchicine to manage symptoms and will arrange CV follow-up. -Start colchicine 0.6mg  BID x 5days -Has follow up in  Afib clinic next week  #Aflutter s/p Ablation: Underwent successful ablation on 11/16. Now with pericarditis as detailed above.  -Contine apixaban 5mg  BID -Continue flec 50mg  BID -Continue metoprolol 25mg  daily -Follow-up scheduled with Afib clinic and EP   No further cardiac work-up needed at this time.   Risk Assessment/Risk Scores:     HEAR Score (for undifferentiated chest pain):  HEAR Score: 6    CHA2DS2-VASc Score = 4  {This indicates a 4.8% annual risk of stroke. The patient's score is based upon: CHF History: 0 HTN History: 1 Diabetes History: 1 Stroke History: 0 Vascular Disease History: 1 Age Score: 1 Gender Score: 0     For questions or updates, please contact [facility] Please consult [email or web address] for contact info under    Signed, Freada Bergeron, MD  08/06/2021 6:53 PM

## 2021-08-06 NOTE — ED Provider Notes (Signed)
Emergency Medicine Provider Triage Evaluation Note  Jimmy Bradshaw , a 75 y.o. male  was evaluated in triage.  Pt complains of chest tightness and sob that started today. Had an ablation yesterday. Pain is pleuritic, left arm hurts  Review of Systems  Positive: Chest tightness, sob Negative: cough  Physical Exam  BP 131/76 (BP Location: Left Arm)   Pulse 72   Temp 98.2 F (36.8 C)   Resp 18   SpO2 100%  Gen:   Awake, no distress   Resp:  Normal effort  MSK:   Moves extremities without difficulty  Other:  Heart with rrr, bibasilar rales, DP pulse 2+ to the lle  Medical Decision Making  Medically screening exam initiated at 2:30 PM.  Appropriate orders placed.  [name] was informed that the remainder of the evaluation will be completed by another provider, this initial triage assessment does not replace that evaluation, and the importance of remaining in the ED until their evaluation is complete.     Rodney Booze, PA-C 08/06/21 1434    Truddie Hidden, MD 08/06/21 [phone]

## 2021-08-07 ENCOUNTER — Other Ambulatory Visit: Payer: Self-pay | Admitting: Cardiovascular Disease

## 2021-08-07 MED FILL — Heparin Sod (Porcine)-NaCl IV Soln 2000 Unit/L-0.9%: INTRAVENOUS | Qty: 2000 | Status: AC

## 2021-08-09 ENCOUNTER — Encounter: Payer: Self-pay | Admitting: Cardiovascular Disease

## 2021-08-10 MED ORDER — APIXABAN 5 MG PO TABS: ORAL_TABLET | ORAL | 1 refills | Status: DC

## 2021-08-10 NOTE — Telephone Encounter (Signed)
Pt was given 1 box of samples of Eliquis 5 mg tablet per Ovidio Kin, RN.

## 2021-08-10 NOTE — Telephone Encounter (Signed)
Pt last saw Dr Curt Bears 06/02/21, last labs 08/06/21 Creat 1.30, age 75, weight 98.4kg, based on specified criteria pt is on appropriate dosage of Eliquis 5mg  BID for afib.  Will refill rx.

## 2021-09-02 ENCOUNTER — Other Ambulatory Visit: Payer: Self-pay

## 2021-09-02 ENCOUNTER — Encounter (HOSPITAL_COMMUNITY): Payer: Self-pay | Admitting: Nurse Practitioner

## 2021-09-02 ENCOUNTER — Ambulatory Visit (HOSPITAL_COMMUNITY)
Admission: RE | Admit: 2021-09-02 | Discharge: 2021-09-02 | Disposition: A | Discharge status: Home / Self Care | Payer: Medicare HMO | Source: Ambulatory Visit | Attending: Nurse Practitioner | Admitting: Nurse Practitioner

## 2021-09-02 VITALS — BP 110/90 | HR 108 | Ht 71.5 in | Wt 211.8 lb

## 2021-09-02 DIAGNOSIS — D6869 Other thrombophilia: Secondary | ICD-10-CM

## 2021-09-02 DIAGNOSIS — I10 Essential (primary) hypertension: Secondary | ICD-10-CM | POA: Insufficient documentation

## 2021-09-02 DIAGNOSIS — I480 Paroxysmal atrial fibrillation: Secondary | ICD-10-CM

## 2021-09-02 DIAGNOSIS — I4892 Unspecified atrial flutter: Secondary | ICD-10-CM | POA: Insufficient documentation

## 2021-09-02 DIAGNOSIS — Z7901 Long term (current) use of anticoagulants: Secondary | ICD-10-CM | POA: Insufficient documentation

## 2021-09-02 DIAGNOSIS — I4891 Unspecified atrial fibrillation: Secondary | ICD-10-CM | POA: Insufficient documentation

## 2021-09-02 MED ORDER — METOPROLOL SUCCINATE ER 25 MG PO TB24: 37.5000 mg | ORAL_TABLET | Freq: Every day | ORAL | 2 refills | Status: DC

## 2021-09-02 NOTE — Patient Instructions (Signed)
Increase metoprolol to 1 and 1/2 tablets daily (37.5mg  once a day)

## 2021-09-02 NOTE — Progress Notes (Signed)
Primary Care Physician: Susy Frizzle, MD Referring Physician: Dr. Lavone Neri is a 75 y.o. male with a h/o ablation for afib 08/05/21. He presented to the ER next day for pleuritic type chest discomfort. He was seen in consult by Dr. Johney Frame. Dx with pericarditis. Bedside POC without effusion. He was given Rx for colchicine.   In the clinic today, one month post ablation he reports that he is doing well. The chest discomfort subsided. He is back to his usual activities and without swallowing or groin issues. Ekg shows atrial flutter at 108 bpm. He felt he went out of rhythm last week. He missed his am meds this am, including his eliquis.   I reviewed his apple watch strips and he is in SR as well as having periods of afib. The pt does not feel he has his energy back form the procedure yet. No swallowing or groin issues.   Today, he denies symptoms of palpitations, chest pain, shortness of breath, orthopnea, PND, lower extremity edema, dizziness, presyncope, syncope, or neurologic sequela. The patient is tolerating medications without difficulties and is otherwise without complaint today.   Past Medical History:  Diagnosis Date   Anxiety    Arthritis    HANDS,  SHOULDERS   Bladder neck obstruction    BPH (benign prostatic hypertrophy)    Frequency of urination    H/O: gout    History of colon polyps    Hyperlipidemia    Nocturia    PAF (paroxysmal atrial fibrillation) ([place])    Type 2 diabetes mellitus ([place])    diet controlled   Urgency of urination    Wears hearing aid    BILATERAL   Past Surgical History:  Procedure Laterality Date   ATRIAL FIBRILLATION ABLATION N/A 08/05/2021   Procedure: ATRIAL FIBRILLATION ABLATION;  Surgeon: Constance Haw, MD;  Location: [place] [facility];  Service: Cardiovascular;  Laterality: N/A;   CARDIOVERSION N/A 10/26/2016   Procedure: CARDIOVERSION;  Surgeon: Larey Dresser, MD;  Location: [place];  Service:  Cardiovascular;  Laterality: N/A;   CARDIOVERSION N/A 04/17/2019   Procedure: CARDIOVERSION;  Surgeon: Sueanne Margarita, MD;  Location: [facility];  Service: Cardiovascular;  Laterality: N/A;   COLONOSCOPY  08-30-2011, 2017   I & D KNEE WITH POLY EXCHANGE Left 10/08/2014   Procedure: IRRIGATION AND DEBRIDEMENT LEFT UNI KNEE ARTHOPLASTY  WITH POLY EXCHANGE;  Surgeon: Mauri Pole, MD;  [facility];  Service: Orthopedics;  Laterality: Left;   KNEE ARTHROSCOPY Left 11/ 2013   PARTIAL KNEE ARTHROPLASTY Left 02/05/2013   Procedure: LEFT KNEE MEDIAL UNICOMPARTMENTAL KNEE;  Surgeon: Mauri Pole, MD;  [facility];  Service: Orthopedics;  Laterality: Left;   PICC LINE INSERTION     and removed later   TRANSURETHRAL RESECTION OF PROSTATE N/A 09/30/2014   Procedure: TRANSURETHRAL RESECTION OF THE PROSTATE WITH GYRUS INSTRUMENTS;  Surgeon: Bernestine Amass, MD;  [facility];  Service: Urology;  Laterality: N/A;   TRANSURETHRAL RESECTION OF PROSTATE N/A 07/03/2020   Procedure: TRANSURETHRAL RESECTION OF THE PROSTATE (TURP);  Surgeon: Franchot Gallo, MD;  Location: [facility];  Service: Urology;  Laterality: N/A;  1 HR    Current Outpatient Medications  Medication Sig Dispense Refill   allopurinol (ZYLOPRIM) 100 MG tablet Take 1 tablet (100 mg total) by mouth daily. (Patient taking differently: Take 400 mg by mouth daily.) 90 tablet 1   allopurinol (ZYLOPRIM) 300 MG  tablet Take 1 tablet (300 mg total) by mouth daily. (Patient not taking: Reported on 08/06/2021) 90 tablet 1   apixaban (ELIQUIS) 5 MG TABS tablet TAKE 1 TABLET (5 MG) BY MOUTH 2 (TWO) TIMES DAILY. 180 tablet 1   buPROPion (WELLBUTRIN XL) 300 MG 24 hr tablet Take 1 tablet (300 mg total) by mouth daily. 90 tablet 3   colchicine 0.6 MG tablet Take 1 tablet (0.6 mg total) by mouth 2 (two) times daily for 5 days. 10 tablet 0   flecainide (TAMBOCOR) 50 MG tablet Take 1 tablet (50 mg total) by  mouth 2 (two) times daily. 180 tablet 3   lisinopril (ZESTRIL) 2.5 MG tablet Take 1 tablet (2.5 mg total) by mouth daily. 90 tablet 3   metoprolol succinate (TOPROL-XL) 25 MG 24 hr tablet TAKE 1 TABLET EVERY DAY 90 tablet 2   rosuvastatin (CRESTOR) 10 MG tablet Take 1 tablet (10 mg total) by mouth daily. (Patient taking differently: Take 10 mg by mouth at bedtime.) 90 tablet 3   No current facility-administered medications for this encounter.    Allergies  Allergen Reactions   Pravastatin Other (See Comments)    Headache and legs hurt   Penicillins Rash    Reaction as a child. Tolerated a penicillin shot w/o reaction 2018    Social History   Socioeconomic History   Marital status: Married    Spouse name: Not on file   Number of children: Not on file   Years of education: Not on file   Highest education level: Not on file  Occupational History   Not on file  Tobacco Use   Smoking status: Former    Packs/day: 1.50    Years: 25.00    Pack years: 37.50    Types: Cigarettes    Quit date: 12/08/1998    Years since quitting: 22.7   Smokeless tobacco: Never  Vaping Use   Vaping Use: Never used  Substance and Sexual Activity   Alcohol use: Yes    Alcohol/week: 1.0 standard drink    Types: 1 Glasses of wine per week    Comment: RARE - a sip, occassional   Drug use: No   Sexual activity: Not Currently  Other Topics Concern   Not on file  Social History Narrative   Not on file   Social Determinants of Health   Financial Resource Strain: Not on file  Food Insecurity: Not on file  Transportation Needs: Not on file  Physical Activity: Not on file  Stress: Not on file  Social Connections: Not on file  Intimate Partner Violence: Not on file    Family History  Adopted: Yes  Problem Relation Age of Onset   Colon cancer Father     ROS- All systems are reviewed and negative except as per the HPI above  Physical Exam: There were no vitals filed for this visit. Wt  Readings from Last 3 Encounters:  08/06/21 98.4 kg  08/05/21 98.4 kg  06/02/21 98.9 kg    Labs: Lab Results  Component Value Date   NA 136 08/06/2021   K 3.6 08/06/2021   CL 104 08/06/2021   CO2 22 08/06/2021   GLUCOSE 145 (H) 08/06/2021   BUN 19 08/06/2021   CREATININE 1.30 (H) 08/06/2021   CALCIUM 8.8 (L) 08/06/2021   MG 1.8 10/29/2017   Lab Results  Component Value Date   INR 1.1 10/19/2016   Lab Results  Component Value Date   CHOL 126 01/03/2020  HDL 32 (L) 01/03/2020   LDLCALC 75 01/03/2020   TRIG 103 01/03/2020     GEN- The patient is well appearing, alert and oriented x 3 today.   Head- normocephalic, atraumatic Eyes-  Sclera clear, conjunctiva pink Ears- hearing intact Oropharynx- clear Neck- supple, no JVP Lymph- no cervical lymphadenopathy Lungs- Clear to ausculation bilaterally, normal work of breathing Heart- Regular rate and rhythm, no murmurs, rubs or gallops, PMI not laterally displaced GI- soft, NT, ND, + BS Extremities- no clubbing, cyanosis, or edema MS- no significant deformity or atrophy Skin- no rash or lesion Psych- euthymic mood, full affect Neuro- strength and sensation are intact  EKG-Vent. rate 108 BPM PR interval * ms QRS duration 108 ms QT/QTcB 362/485 ms P-R-T axes * 76 3 Atrial flutter with variable A-V block Abnormal ECG Compared to previous tracing , atrial flutter has replced sinus rhythm    Assessment and Plan:  1. Afib S/p ablation x month on 11/16 Intermittent afib/flutter for the last week per review of apple strips showing intermittent SR I will increase Toprol to 37.5 mg daily He will watch BP/HR's at home  I will see back in one -two weeks   2. CHA2DS2VASc  score of at least 3 Continue eliquis 5 mg bid   3. HTN Stable  F/u in 1-2 weeks in afib clinic  Ivie Savitt C. Jonee Lamore, [facility] [address] [place], [place] [address] [phone]

## 2021-09-03 ENCOUNTER — Encounter (HOSPITAL_COMMUNITY): Payer: Self-pay | Admitting: Nurse Practitioner

## 2021-09-16 ENCOUNTER — Ambulatory Visit (HOSPITAL_COMMUNITY)
Admission: RE | Admit: 2021-09-16 | Discharge: 2021-09-16 | Disposition: A | Discharge status: Home / Self Care | Payer: Medicare HMO | Source: Ambulatory Visit | Attending: Nurse Practitioner | Admitting: Nurse Practitioner

## 2021-09-16 ENCOUNTER — Other Ambulatory Visit: Payer: Self-pay

## 2021-09-16 VITALS — BP 116/72 | HR 69 | Ht 71.5 in | Wt 217.0 lb

## 2021-09-16 DIAGNOSIS — I10 Essential (primary) hypertension: Secondary | ICD-10-CM | POA: Diagnosis not present

## 2021-09-16 DIAGNOSIS — I4891 Unspecified atrial fibrillation: Secondary | ICD-10-CM | POA: Diagnosis not present

## 2021-09-16 DIAGNOSIS — Z7901 Long term (current) use of anticoagulants: Secondary | ICD-10-CM | POA: Insufficient documentation

## 2021-09-16 DIAGNOSIS — Z79899 Other long term (current) drug therapy: Secondary | ICD-10-CM | POA: Insufficient documentation

## 2021-09-16 DIAGNOSIS — I4892 Unspecified atrial flutter: Secondary | ICD-10-CM

## 2021-09-16 DIAGNOSIS — D6869 Other thrombophilia: Secondary | ICD-10-CM | POA: Diagnosis not present

## 2021-09-16 NOTE — Progress Notes (Signed)
Primary Care Physician: Susy Frizzle, MD Referring Physician: Dr. Lavone Neri is a 75 y.o. male with a h/o ablation for afib 08/05/21. He presented to the ER next day for pleuritic type chest discomfort. He was seen in consult by Dr. Johney Frame. Dx with pericarditis. Bedside POC without effusion. He was given Rx for colchicine.   In the clinic today, one month post ablation he reports that he is doing well. The chest discomfort subsided. He is back to his usual activities and without swallowing or groin issues. Ekg shows atrial flutter at 108 bpm. He felt he went out of rhythm last week. He missed his am meds this am, including his eliquis.   I reviewed his apple watch strips and he is in SR as well as having periods of afib. The pt does not feel he has his energy back from  the procedure yet. No swallowing or groin issues.   F/u in afib clinic, 09/17/21. At last visit, I increased the BB, and he has noted less arrhythmic on his apple watch. He is in SR today. He is feeling better, but still feels as though he has some fatigue since the procedure.   Today, he denies symptoms of palpitations, chest pain, shortness of breath, orthopnea, PND, lower extremity edema, dizziness, presyncope, syncope, or neurologic sequela. The patient is tolerating medications without difficulties and is otherwise without complaint today.   Past Medical History:  Diagnosis Date   Anxiety    Arthritis    HANDS,  SHOULDERS   Bladder neck obstruction    BPH (benign prostatic hypertrophy)    Frequency of urination    H/O: gout    History of colon polyps    Hyperlipidemia    Nocturia    PAF (paroxysmal atrial fibrillation) ([place])    Type 2 diabetes mellitus ([place])    diet controlled   Urgency of urination    Wears hearing aid    BILATERAL   Past Surgical History:  Procedure Laterality Date   ATRIAL FIBRILLATION ABLATION N/A 08/05/2021   Procedure: ATRIAL FIBRILLATION ABLATION;  Surgeon:  Constance Haw, MD;  Location: [place] [facility];  Service: Cardiovascular;  Laterality: N/A;   CARDIOVERSION N/A 10/26/2016   Procedure: CARDIOVERSION;  Surgeon: Larey Dresser, MD;  Location: [place];  Service: Cardiovascular;  Laterality: N/A;   CARDIOVERSION N/A 04/17/2019   Procedure: CARDIOVERSION;  Surgeon: Sueanne Margarita, MD;  Location: [facility]OSCOPY;  Service: Cardiovascular;  Laterality: N/A;   COLONOSCOPY  08-30-2011, 2017   I & D KNEE WITH POLY EXCHANGE Left 10/08/2014   Procedure: IRRIGATION AND DEBRIDEMENT LEFT UNI KNEE ARTHOPLASTY  WITH POLY EXCHANGE;  Surgeon: Mauri Pole, MD;  [facility];  Service: Orthopedics;  Laterality: Left;   KNEE ARTHROSCOPY Left 11/ 2013   PARTIAL KNEE ARTHROPLASTY Left 02/05/2013   Procedure: LEFT KNEE MEDIAL UNICOMPARTMENTAL KNEE;  Surgeon: Mauri Pole, MD;  [facility];  Service: Orthopedics;  Laterality: Left;   PICC LINE INSERTION     and removed later   TRANSURETHRAL RESECTION OF PROSTATE N/A 09/30/2014   Procedure: TRANSURETHRAL RESECTION OF THE PROSTATE WITH GYRUS INSTRUMENTS;  Surgeon: Bernestine Amass, MD;  Location: [facility];  Service: Urology;  Laterality: N/A;   TRANSURETHRAL RESECTION OF PROSTATE N/A 07/03/2020   Procedure: TRANSURETHRAL RESECTION OF THE PROSTATE (TURP);  Surgeon: Franchot Gallo, MD;  Location: [facility];  Service: Urology;  Laterality: N/A;  1  HR    Current Outpatient Medications  Medication Sig Dispense Refill   allopurinol (ZYLOPRIM) 100 MG tablet Take 1 tablet (100 mg total) by mouth daily. (Patient taking differently: Take 400 mg by mouth daily.) 90 tablet 1   allopurinol (ZYLOPRIM) 300 MG tablet Take 1 tablet (300 mg total) by mouth daily. 90 tablet 1   apixaban (ELIQUIS) 5 MG TABS tablet TAKE 1 TABLET (5 MG) BY MOUTH 2 (TWO) TIMES DAILY. 180 tablet 1   buPROPion (WELLBUTRIN XL) 300 MG 24 hr tablet Take 1 tablet (300 mg total) by mouth daily. 90 tablet 3    flecainide (TAMBOCOR) 50 MG tablet Take 1 tablet (50 mg total) by mouth 2 (two) times daily. 180 tablet 3   lisinopril (ZESTRIL) 2.5 MG tablet Take 1 tablet (2.5 mg total) by mouth daily. 90 tablet 3   metoprolol succinate (TOPROL-XL) 25 MG 24 hr tablet Take 1.5 tablets (37.5 mg total) by mouth daily. 90 tablet 2   rosuvastatin (CRESTOR) 10 MG tablet Take 1 tablet (10 mg total) by mouth daily. 90 tablet 3   colchicine 0.6 MG tablet Take 1 tablet (0.6 mg total) by mouth 2 (two) times daily for 5 days. (Patient not taking: Reported on 09/16/2021) 10 tablet 0   No current facility-administered medications for this encounter.    Allergies  Allergen Reactions   Pravastatin Other (See Comments)    Headache and legs hurt   Penicillins Rash    Reaction as a child. Tolerated a penicillin shot w/o reaction 2018    Social History   Socioeconomic History   Marital status: Married    Spouse name: Not on file   Number of children: Not on file   Years of education: Not on file   Highest education level: Not on file  Occupational History   Not on file  Tobacco Use   Smoking status: Former    Packs/day: 1.50    Years: 25.00    Pack years: 37.50    Types: Cigarettes    Quit date: 12/08/1998    Years since quitting: 22.7   Smokeless tobacco: Never   Tobacco comments:    Former smoker 09/02/21  Vaping Use   Vaping Use: Never used  Substance and Sexual Activity   Alcohol use: Yes    Alcohol/week: 1.0 standard drink    Types: 1 Glasses of wine per week    Comment: RARE - a sip, occassional   Drug use: No   Sexual activity: Not Currently  Other Topics Concern   Not on file  Social History Narrative   Not on file   Social Determinants of Health   Financial Resource Strain: Not on file  Food Insecurity: Not on file  Transportation Needs: Not on file  Physical Activity: Not on file  Stress: Not on file  Social Connections: Not on file  Intimate Partner Violence: Not on file     Family History  Adopted: Yes  Problem Relation Age of Onset   Colon cancer Father     ROS- All systems are reviewed and negative except as per the HPI above  Physical Exam: Vitals:   09/16/21 1425  BP: 116/72  Pulse: 69  Weight: 98.4 kg  Height: 5' 11.5" (1.816 m)   Wt Readings from Last 3 Encounters:  09/16/21 98.4 kg  09/02/21 96.1 kg  08/06/21 98.4 kg    Labs: Lab Results  Component Value Date   NA 136 08/06/2021   K 3.6 08/06/2021  CL 104 08/06/2021   CO2 22 08/06/2021   GLUCOSE 145 (H) 08/06/2021   BUN 19 08/06/2021   CREATININE 1.30 (H) 08/06/2021   CALCIUM 8.8 (L) 08/06/2021   MG 1.8 10/29/2017   Lab Results  Component Value Date   INR 1.1 10/19/2016   Lab Results  Component Value Date   CHOL 126 01/03/2020   HDL 32 (L) 01/03/2020   LDLCALC 75 01/03/2020   TRIG 103 01/03/2020     GEN- The patient is well appearing, alert and oriented x 3 today.   Head- normocephalic, atraumatic Eyes-  Sclera clear, conjunctiva pink Ears- hearing intact Oropharynx- clear Neck- supple, no JVP Lymph- no cervical lymphadenopathy Lungs- Clear to ausculation bilaterally, normal work of breathing Heart- Regular rate and rhythm, no murmurs, rubs or gallops, PMI not laterally displaced GI- soft, NT, ND, + BS Extremities- no clubbing, cyanosis, or edema MS- no significant deformity or atrophy Skin- no rash or lesion Psych- euthymic mood, full affect Neuro- strength and sensation are intact  NSR at 69 bpm, pr int 196 ms, qrs int 106 ms, qtc 432 ms     Assessment and Plan:  1. Afib S/p ablation  11/16 Intermittent afib/flutter after ablation but now is in SR  per apple watch with increase of BB Continue Toprol 37.5 mg daily   2. CHA2DS2VASc  score of at least 3 Continue eliquis 5 mg bid   3. HTN Stable  F/u as  needed in afib clinic, f/u with Dr. Curt Bears 11/23/21  Geroge Baseman. Rosalee Tolley, [facility] [address] [place], [place] [address] [phone]

## 2021-10-01 ENCOUNTER — Other Ambulatory Visit: Payer: Self-pay | Admitting: Family Medicine

## 2021-10-05 ENCOUNTER — Encounter: Payer: Self-pay | Admitting: Family Medicine

## 2021-10-06 ENCOUNTER — Other Ambulatory Visit: Payer: Self-pay

## 2021-10-06 ENCOUNTER — Ambulatory Visit (INDEPENDENT_AMBULATORY_CARE_PROVIDER_SITE_OTHER): Payer: Medicare HMO | Admitting: Family Medicine

## 2021-10-06 ENCOUNTER — Encounter: Payer: Self-pay | Admitting: Family Medicine

## 2021-10-06 VITALS — BP 122/82 | HR 70 | Temp 97.8°F | Resp 18 | Ht 71.0 in | Wt 218.0 lb

## 2021-10-06 DIAGNOSIS — U071 COVID-19: Secondary | ICD-10-CM

## 2021-10-06 MED ORDER — HYDROCODONE BIT-HOMATROP MBR 5-1.5 MG/5ML PO SOLN: 5.0000 mL | Freq: Three times a day (TID) | ORAL | 0 refills | Status: DC | PRN

## 2021-10-06 NOTE — Progress Notes (Addendum)
Subjective:    Patient ID: Jimmy Bradshaw, male    DOB: 11/16/1945, 76 y.o.   MRN: [id number]  HPI  Symptoms began Wednesday of last week.  Symptoms included a cough and some mild congestion.  4 days ago he had a fever above 100.  Since that time his fever has been 99 or less.  He denies any chest pain.  He denies any shortness of breath.  He denies any dyspnea on exertion.  He denies any pleurisy.  He tested positive for COVID yesterday however he is outside the 5-day window.  Basically he is almost back to normal aside from a persistent hacking cough at night.  During the day he states he is doing well Past Medical History:  Diagnosis Date   Anxiety    Arthritis    HANDS,  SHOULDERS   Bladder neck obstruction    BPH (benign prostatic hypertrophy)    Frequency of urination    H/O: gout    History of colon polyps    Hyperlipidemia    Nocturia    PAF (paroxysmal atrial fibrillation) ([place])    Type 2 diabetes mellitus ([place])    diet controlled   Urgency of urination    Wears hearing aid    BILATERAL   Past Surgical History:  Procedure Laterality Date   ATRIAL FIBRILLATION ABLATION N/A 08/05/2021   Procedure: ATRIAL FIBRILLATION ABLATION;  Surgeon: Constance Haw, MD;  Location: [place] [facility];  Service: Cardiovascular;  Laterality: N/A;   CARDIOVERSION N/A 10/26/2016   Procedure: CARDIOVERSION;  Surgeon: Larey Dresser, MD;  Location: [place];  Service: Cardiovascular;  Laterality: N/A;   CARDIOVERSION N/A 04/17/2019   Procedure: CARDIOVERSION;  Surgeon: Sueanne Margarita, MD;  Location: [facility] ENDOSCOPY;  Service: Cardiovascular;  Laterality: N/A;   COLONOSCOPY  08-30-2011, 2017   I & D KNEE WITH POLY EXCHANGE Left 10/08/2014   Procedure: IRRIGATION AND DEBRIDEMENT LEFT UNI KNEE ARTHOPLASTY  WITH POLY EXCHANGE;  Surgeon: Mauri Pole, MD;  [facility];  Service: Orthopedics;  Laterality: Left;   KNEE ARTHROSCOPY Left 11/ 2013   PARTIAL KNEE  ARTHROPLASTY Left 02/05/2013   Procedure: LEFT KNEE MEDIAL UNICOMPARTMENTAL KNEE;  Surgeon: Mauri Pole, MD;  [facility];  Service: Orthopedics;  Laterality: Left;   PICC LINE INSERTION     and removed later   TRANSURETHRAL RESECTION OF PROSTATE N/A 09/30/2014   Procedure: TRANSURETHRAL RESECTION OF THE PROSTATE WITH GYRUS INSTRUMENTS;  Surgeon: Bernestine Amass, MD;  Location: [facility];  Service: Urology;  Laterality: N/A;   TRANSURETHRAL RESECTION OF PROSTATE N/A 07/03/2020   Procedure: TRANSURETHRAL RESECTION OF THE PROSTATE (TURP);  Surgeon: Franchot Gallo, MD;  Location: [facility];  Service: Urology;  Laterality: N/A;  1 HR   Current Outpatient Medications on File Prior to Visit  Medication Sig Dispense Refill   allopurinol (ZYLOPRIM) 100 MG tablet TAKE 1 TABLET EVERY DAY (TOTAL 400MG /DAY) 90 tablet 1   allopurinol (ZYLOPRIM) 300 MG tablet Take 1 tablet (300 mg total) by mouth daily. 90 tablet 1   apixaban (ELIQUIS) 5 MG TABS tablet TAKE 1 TABLET (5 MG) BY MOUTH 2 (TWO) TIMES DAILY. 180 tablet 1   buPROPion (WELLBUTRIN XL) 300 MG 24 hr tablet Take 1 tablet (300 mg total) by mouth daily. 90 tablet 3   colchicine 0.6 MG tablet Take 1 tablet (0.6 mg total) by mouth 2 (two) times daily for 5 days. (Patient not taking:  Reported on 09/16/2021) 10 tablet 0   flecainide (TAMBOCOR) 50 MG tablet Take 1 tablet (50 mg total) by mouth 2 (two) times daily. 180 tablet 3   lisinopril (ZESTRIL) 2.5 MG tablet Take 1 tablet (2.5 mg total) by mouth daily. 90 tablet 3   metoprolol succinate (TOPROL-XL) 25 MG 24 hr tablet Take 1.5 tablets (37.5 mg total) by mouth daily. 90 tablet 2   MITIGARE 0.6 MG CAPS Take 1 capsule by mouth 2 (two) times daily.     rosuvastatin (CRESTOR) 10 MG tablet Take 1 tablet (10 mg total) by mouth daily. 90 tablet 3   No current facility-administered medications on file prior to visit.   Allergies  Allergen Reactions    Pravastatin Other (See Comments)    Headache and legs hurt   Penicillins Rash    Reaction as a child. Tolerated a penicillin shot w/o reaction 2018   Social History   Socioeconomic History   Marital status: Married    Spouse name: Not on file   Number of children: Not on file   Years of education: Not on file   Highest education level: Not on file  Occupational History   Not on file  Tobacco Use   Smoking status: Former    Packs/day: 1.50    Years: 25.00    Pack years: 37.50    Types: Cigarettes    Quit date: 12/08/1998    Years since quitting: 22.8   Smokeless tobacco: Never   Tobacco comments:    Former smoker 09/02/21  Vaping Use   Vaping Use: Never used  Substance and Sexual Activity   Alcohol use: Yes    Alcohol/week: 1.0 standard drink    Types: 1 Glasses of wine per week    Comment: RARE - a sip, occassional   Drug use: No   Sexual activity: Not Currently  Other Topics Concern   Not on file  Social History Narrative   Not on file   Social Determinants of Health   Financial Resource Strain: Not on file  Food Insecurity: Not on file  Transportation Needs: Not on file  Physical Activity: Not on file  Stress: Not on file  Social Connections: Not on file  Intimate Partner Violence: Not on file      Review of Systems  All other systems reviewed and are negative.     Objective:   Physical Exam Vitals reviewed.  Constitutional:      General: He is not in acute distress.    Appearance: Normal appearance. He is well-developed and normal weight. He is not ill-appearing, toxic-appearing or diaphoretic.  HENT:     Head: Normocephalic and atraumatic.     Right Ear: Tympanic membrane and ear canal normal.     Left Ear: Tympanic membrane and ear canal normal.     Nose: Congestion and rhinorrhea present.     Mouth/Throat:     Pharynx: No oropharyngeal exudate or posterior oropharyngeal erythema.  Eyes:     Conjunctiva/sclera: Conjunctivae  normal.     Pupils: Pupils are equal, round, and reactive to light.  Cardiovascular:     Rate and Rhythm: Normal rate and regular rhythm.     Heart sounds: Normal heart sounds. No murmur heard.   No friction rub. No gallop.  Pulmonary:     Effort: Pulmonary effort is normal. No respiratory distress.     Breath sounds: Normal breath sounds. No stridor. No wheezing, rhonchi or rales.  Chest:  Chest wall: No tenderness.  Abdominal:     General: Abdomen is flat. Bowel sounds are normal. There is no distension.     Palpations: Abdomen is soft.     Tenderness: There is no abdominal tenderness. There is no guarding.  Musculoskeletal:     Right lower leg: No edema.     Left lower leg: No edema.  Lymphadenopathy:     Cervical: No cervical adenopathy.  Neurological:     General: No focal deficit present.     Mental Status: He is alert and oriented to person, place, and time.     Motor: Tremor present. No atrophy, abnormal muscle tone, seizure activity or pronator drift.           Assessment & Plan:  COVID Patient is entering day 7 of symptoms.  He is doing well and essentially is back to his baseline.  Therefore I do not feel that he would benefit from paxlovid.  At this point I will treat his cough with Hycodan and recommended tincture of time.  Recheck immediately if symptoms worsen.

## 2021-10-07 ENCOUNTER — Other Ambulatory Visit: Payer: Self-pay

## 2021-10-07 MED ORDER — METOPROLOL SUCCINATE ER 25 MG PO TB24: 37.5000 mg | ORAL_TABLET | Freq: Every day | ORAL | 2 refills | Status: DC

## 2021-10-07 NOTE — Telephone Encounter (Signed)
Pt's medication was sent to pt's pharmacy as requested. Confirmation received.  °

## 2021-10-15 ENCOUNTER — Other Ambulatory Visit: Payer: Self-pay

## 2021-10-15 ENCOUNTER — Ambulatory Visit (INDEPENDENT_AMBULATORY_CARE_PROVIDER_SITE_OTHER): Payer: Medicare HMO

## 2021-10-15 VITALS — BP 114/60 | HR 61 | Ht 71.0 in | Wt 209.4 lb

## 2021-10-15 DIAGNOSIS — Z0000 Encounter for general adult medical examination without abnormal findings: Secondary | ICD-10-CM

## 2021-10-15 NOTE — Progress Notes (Signed)
Subjective:   Jimmy Bradshaw is a 76 y.o. male who presents for Medicare Annual/Subsequent preventive examination.  Review of Systems     Cardiac Risk Factors include: advanced age (>18men, >108 women);diabetes mellitus;male gender;dyslipidemia;hypertension  IN OFFICE VISIT AT [facility].    Objective:    Today's Vitals   10/15/21 1401  BP: 114/60  Pulse: 61  SpO2: 97%  Weight: 209 lb 6.4 oz (95 kg)  Height: 5\' 11"  (1.803 m)   Body mass index is 29.21 kg/m.  Advanced Directives 10/15/2021 08/06/2021 08/05/2021 07/03/2020 01/23/2020 04/17/2019 04/26/2018  Does Patient Have a Medical Advance Directive? Yes Yes Yes No Yes Yes Yes  Type of Paramedic of [place];Living will [place];Living will [place];Living will - - Living will;Healthcare Power of [place];Living will  Does patient want to make changes to medical advance directive? - - No - Patient declined - No - Patient declined - No - Patient declined  Copy of [place] in Chart? No - copy requested - No - copy requested - - No - copy requested Yes  Would patient like information on creating a medical advance directive? No - Patient declined - - No - Patient declined - - -  Pre-existing out of facility DNR order (yellow form or pink MOST form) - - - - - - -    Current Medications (verified) Outpatient Encounter Medications as of 10/15/2021  Medication Sig   allopurinol (ZYLOPRIM) 100 MG tablet TAKE 1 TABLET EVERY DAY (TOTAL 400MG /DAY)   allopurinol (ZYLOPRIM) 300 MG tablet Take 1 tablet (300 mg total) by mouth daily.   apixaban (ELIQUIS) 5 MG TABS tablet TAKE 1 TABLET (5 MG) BY MOUTH 2 (TWO) TIMES DAILY.   buPROPion (WELLBUTRIN XL) 300 MG 24 hr tablet Take 1 tablet (300 mg total) by mouth daily.   flecainide (TAMBOCOR) 50 MG tablet Take 1 tablet (50 mg total) by mouth 2 (two) times daily.   HYDROcodone bit-homatropine  (HYCODAN) 5-1.5 MG/5ML syrup Take 5 mLs by mouth every 8 (eight) hours as needed for cough.   lisinopril (ZESTRIL) 2.5 MG tablet Take 1 tablet (2.5 mg total) by mouth daily.   metoprolol succinate (TOPROL-XL) 25 MG 24 hr tablet Take 1.5 tablets (37.5 mg total) by mouth daily.   rosuvastatin (CRESTOR) 10 MG tablet Take 1 tablet (10 mg total) by mouth daily.   colchicine 0.6 MG tablet Take 1 tablet (0.6 mg total) by mouth 2 (two) times daily for 5 days. (Patient not taking: Reported on 09/16/2021)   No facility-administered encounter medications on file as of 10/15/2021.    Allergies (verified) Pravastatin and Penicillins   History: Past Medical History:  Diagnosis Date   Anxiety    Arthritis    HANDS,  SHOULDERS   Bladder neck obstruction    BPH (benign prostatic hypertrophy)    Frequency of urination    H/O: gout    History of colon polyps    Hyperlipidemia    Nocturia    PAF (paroxysmal atrial fibrillation) ([place])    Type 2 diabetes mellitus ([place])    diet controlled   Urgency of urination    Wears hearing aid    BILATERAL   Past Surgical History:  Procedure Laterality Date   ATRIAL FIBRILLATION ABLATION N/A 08/05/2021   Procedure: ATRIAL FIBRILLATION ABLATION;  Surgeon: Constance Haw, MD;  Location: [facility];  Service: Cardiovascular;  Laterality: N/A;  CARDIOVERSION N/A 10/26/2016   Procedure: CARDIOVERSION;  Surgeon: Larey Dresser, MD;  Location: [facility];  Service: Cardiovascular;  Laterality: N/A;   CARDIOVERSION N/A 04/17/2019   Procedure: CARDIOVERSION;  Surgeon: Sueanne Margarita, MD;  Location: [facility];  Service: Cardiovascular;  Laterality: N/A;   COLONOSCOPY  08-30-2011, 2017   I & D KNEE WITH POLY EXCHANGE Left 10/08/2014   Procedure: IRRIGATION AND DEBRIDEMENT LEFT UNI KNEE ARTHOPLASTY  WITH POLY EXCHANGE;  Surgeon: Mauri Pole, MD;  Location: [facility];  Service: Orthopedics;  Laterality: Left;   KNEE ARTHROSCOPY Left 11/ 2013   PARTIAL  KNEE ARTHROPLASTY Left 02/05/2013   Procedure: LEFT KNEE MEDIAL UNICOMPARTMENTAL KNEE;  Surgeon: Mauri Pole, MD;  Location: [facility];  Service: Orthopedics;  Laterality: Left;   PICC LINE INSERTION     and removed later   TRANSURETHRAL RESECTION OF PROSTATE N/A 09/30/2014   Procedure: TRANSURETHRAL RESECTION OF THE PROSTATE WITH GYRUS INSTRUMENTS;  Surgeon: Bernestine Amass, MD;  Location: [facility];  Service: Urology;  Laterality: N/A;   TRANSURETHRAL RESECTION OF PROSTATE N/A 07/03/2020   Procedure: TRANSURETHRAL RESECTION OF THE PROSTATE (TURP);  Surgeon: Franchot Gallo, MD;  Location: [facility];  Service: Urology;  Laterality: N/A;  1 HR   Family History  Adopted: Yes  Problem Relation Age of Onset   Colon cancer Father    Social History   Socioeconomic History   Marital status: Married    Spouse name: Not on file   Number of children: Not on file   Years of education: Not on file   Highest education level: Not on file  Occupational History   Not on file  Tobacco Use   Smoking status: Former    Packs/day: 1.50    Years: 25.00    Pack years: 37.50    Types: Cigarettes    Quit date: 12/08/1998    Years since quitting: 22.8   Smokeless tobacco: Never   Tobacco comments:    Former smoker 09/02/21  Vaping Use   Vaping Use: Never used  Substance and Sexual Activity   Alcohol use: Yes    Alcohol/week: 1.0 standard drink    Types: 1 Glasses of wine per week    Comment: RARE - a sip, occassional   Drug use: No   Sexual activity: Not Currently  Other Topics Concern   Not on file  Social History Narrative   Not on file   Social Determinants of Health   Financial Resource Strain: Low Risk    Difficulty of Paying Living Expenses: Not hard at all  Food Insecurity: No Food Insecurity   Worried About Charity fundraiser in the Last Year: Never true   Ran Out of Food in the Last Year: Never true  Transportation Needs: No Transportation  Needs   Lack of Transportation (Medical): No   Lack of Transportation (Non-Medical): No  Physical Activity: Sufficiently Active   Days of Exercise per Week: 4 days   Minutes of Exercise per Session: 40 min  Stress: No Stress Concern Present   Feeling of Stress : Not at all  Social Connections: Socially Integrated   Frequency of Communication with Friends and Family: More than three times a week   Frequency of Social Gatherings with Friends and Family: More than three times a week   Attends Religious Services: 1 to 4 times per year   Active Member of [employer] or Organizations: Yes   Attends Club or  Organization Meetings: More than 4 times per year   Marital Status: Married    Tobacco Counseling Counseling given: Not Answered Tobacco comments: Former smoker 09/02/21   Clinical Intake:  Pre-visit preparation completed: Yes  Pain : No/denies pain     BMI - recorded: 29.21 Nutritional Status: BMI 25 -29 Overweight Nutritional Risks: None Diabetes: Yes  How often do you need to have someone help you when you read instructions, pamphlets, or other written materials from your doctor or pharmacy?: 1 - Never  Diabetic?Nutrition Risk Assessment:  Has the patient had any N/V/D within the last 2 months?  No  Does the patient have any non-healing wounds?  No  Has the patient had any unintentional weight loss or weight gain?  No   Diabetes:  Is the patient diabetic?  Yes  If diabetic, was a CBG obtained today?  No  Did the patient bring in their glucometer from home?  No  How often do you monitor your CBG's? A1C's at office.   Financial Strains and Diabetes Management:  Are you having any financial strains with the device, your supplies or your medication? No .  Does the patient want to be seen by Chronic Care Management for management of their diabetes?  No  Would the patient like to be referred to a Nutritionist or for Diabetic Management?  No   Diabetic Exams:  Diabetic Eye  Exam: Completed 2022. Pt has been advised about the importance in completing this exam.  Diabetic Foot Exam: Completed 03/06/2018. Pt has been advised about the importance in completing this exam.     Information entered by :: mj Kiran Lapine, lpn   Activities of Daily Living In your present state of health, do you have any difficulty performing the following activities: 10/15/2021  Hearing? N  Vision? N  Difficulty concentrating or making decisions? N  Walking or climbing stairs? N  Dressing or bathing? N  Doing errands, shopping? N  Preparing Food and eating ? N  Using the Toilet? N  In the past six months, have you accidently leaked urine? N  Do you have problems with loss of bowel control? N  Managing your Medications? N  Managing your Finances? N  Housekeeping or managing your Housekeeping? N  Some recent data might be hidden    Patient Care Team: Susy Frizzle, MD as PCP - General (Family Medicine) Nahser, Wonda Cheng, MD as PCP - Cardiology (Cardiology) Edythe Clarity, [facility] as Pharmacist (Pharmacist)  Indicate any recent Medical Services you may have received from other than [facility] providers in the past year (date may be approximate).     Assessment:   This is a routine wellness examination for [place].  Hearing/Vision screen Hearing Screening - Comments:: Wears hearing aids  Vision Screening - Comments:: Readers. Dr. Katy Fitch. 2021.  Dietary issues and exercise activities discussed: Current Exercise Habits: Home exercise routine, Type of exercise: walking, Time (Minutes): 50, Frequency (Times/Week): 4, Weekly Exercise (Minutes/Week): 200, Intensity: Mild, Exercise limited by: cardiac condition(s);respiratory conditions(s)   Goals Addressed             This Visit's Progress    Exercise 3x per week (30 min per time)   On track    Training dogs, running.       Depression Screen PHQ 2/9 Scores 10/15/2021 01/23/2020 11/07/2018 08/11/2018 04/26/2018 03/03/2018 10/11/2017   PHQ - 2 Score 0 0 1 0 0 0 0  PHQ- 9 Score - 0 - - - - 0  Fall Risk Fall Risk  10/15/2021 10/17/2020 01/23/2020 11/07/2018 08/11/2018  Falls in the past year? 0 0 1 1 0  Number falls in past yr: 0 0 0 1 -  Injury with Fall? 0 0 0 0 -  Risk for fall due to : No Fall Risks No Fall Risks No Fall Risks History of fall(s) -  Follow up Falls prevention discussed Falls evaluation completed Falls evaluation completed Falls evaluation completed Falls evaluation completed    [place]:  Any stairs in or around the home? Yes  If so, are there any without handrails? No  Home free of loose throw rugs in walkways, pet beds, electrical cords, etc? Yes  Adequate lighting in your home to reduce risk of falls? Yes   ASSISTIVE DEVICES UTILIZED TO PREVENT FALLS:  Life alert? No  Use of a cane, walker or w/c? No  Grab bars in the bathroom? Yes  Shower chair or bench in shower? Yes  Elevated toilet seat or a handicapped toilet? Yes   TIMED UP AND GO:  Was the test performed? Yes .  Length of time to ambulate 10 feet: 8 sec.   Gait steady and fast without use of assistive device  Cognitive Function:     6CIT Screen 10/15/2021  What Year? 0 points  What month? 0 points  What time? 0 points  Count back from 20 0 points  Months in reverse 0 points  Repeat phrase 0 points  Total Score 0    Immunizations Immunization History  Administered Date(s) Administered   Fluad Quad(high Dose 65+) 05/10/2019   Influenza, High Dose Seasonal PF 06/15/2017, 07/06/2018   Influenza,inj,Quad PF,6+ Mos 06/19/2014, 07/28/2015, 06/18/2016   PFIZER(Purple Top)SARS-COV-2 Vaccination 10/25/2019, 11/15/2019, 05/19/2020   Pneumococcal Conjugate-13 09/28/2013, 05/10/2019   Pneumococcal Polysaccharide-23 07/28/2015   Tdap 09/25/2006   Zoster, Live 05/09/2008    TDAP status: Due, Education has been provided regarding the importance of this vaccine. Advised may receive this vaccine  at local pharmacy or Health Dept. Aware to provide a copy of the vaccination record if obtained from local pharmacy or Health Dept. Verbalized acceptance and understanding.  Flu Vaccine status: Up to date  Pneumococcal vaccine status: Up to date  Covid-19 vaccine status: Completed vaccines  Qualifies for Shingles Vaccine? Yes   Zostavax completed Yes   Shingrix Completed?: No.    Education has been provided regarding the importance of this vaccine. Patient has been advised to call insurance company to determine out of pocket expense if they have not yet received this vaccine. Advised may also receive vaccine at local pharmacy or Health Dept. Verbalized acceptance and understanding.  Screening Tests Health Maintenance  Topic Date Due   Zoster Vaccines- Shingrix (1 of 2) Never done   TETANUS/TDAP  09/25/2016   FOOT EXAM  03/07/2019   OPHTHALMOLOGY EXAM  10/18/2019   COVID-19 Vaccine (4 - Booster for Pfizer series) 07/14/2020   HEMOGLOBIN A1C  04/16/2021   INFLUENZA VACCINE  04/20/2021   COLONOSCOPY (Pts 45-15yrs Insurance coverage will need to be confirmed)  08/04/2026   Pneumonia Vaccine 31+ Years old  Completed   Hepatitis C Screening  Completed   HPV VACCINES  Aged Out    Health Maintenance  Health Maintenance Due  Topic Date Due   Zoster Vaccines- Shingrix (1 of 2) Never done   TETANUS/TDAP  09/25/2016   FOOT EXAM  03/07/2019   OPHTHALMOLOGY EXAM  10/18/2019   COVID-19 Vaccine (4 - Booster  for [place] series) 07/14/2020   HEMOGLOBIN A1C  04/16/2021   INFLUENZA VACCINE  04/20/2021    Colorectal cancer screening: Type of screening: Colonoscopy. Completed 08/04/2016. Repeat every 10 years  Lung Cancer Screening: (Low Dose CT Chest recommended if Age 83-80 years, 30 pack-year currently smoking OR have quit w/in 15years.) does not qualify.    Additional Screening:  Hepatitis C Screening: does not qualify; Completed 12/05/2015  Vision Screening: Recommended annual  ophthalmology exams for early detection of glaucoma and other disorders of the eye. Is the patient up to date with their annual eye exam?  Yes  Who is the provider or what is the name of the office in which the patient attends annual eye exams? [facility] If pt is not established with a provider, would they like to be referred to a provider to establish care? No .   Dental Screening: Recommended annual dental exams for proper oral hygiene  Community Resource Referral / Chronic Care Management: CRR required this visit?  No   CCM required this visit?  No      Plan:     I have personally reviewed and noted the following in the patients chart:   Medical and social history Use of alcohol, tobacco or illicit drugs  Current medications and supplements including opioid prescriptions. Patient is not currently taking opioid prescriptions. Functional ability and status Nutritional status Physical activity Advanced directives List of other physicians Hospitalizations, surgeries, and ER visits in previous 12 months Vitals Screenings to include cognitive, depression, and falls Referrals and appointments  In addition, I have reviewed and discussed with patient certain preventive protocols, quality metrics, and best practice recommendations. A written personalized care plan for preventive services as well as general preventive health recommendations were provided to patient.     Chriss Driver, LPN   [id number]   Nurse Notes: Pt is up to date on health maintenance and vaccines. Discussed Shingrix and Tdap.

## 2021-10-15 NOTE — Patient Instructions (Signed)
Jimmy Bradshaw , Thank you for taking time to come for your Medicare Wellness Visit. I appreciate your ongoing commitment to your health goals. Please review the following plan we discussed and let me know if I can assist you in the future.   Screening recommendations/referrals: Colonoscopy: Done 08/04/2016 Repeat in 10 years  Recommended yearly ophthalmology/optometry visit for glaucoma screening and checkup Recommended yearly dental visit for hygiene and checkup  Vaccinations: Influenza vaccine: Done 07/2021 Repeat annually  Pneumococcal vaccine: Done 08/07/2015 and 05/10/2019 Tdap vaccine: Done 09/25/2006 Repeat in 10 years  Shingles vaccine: Zoster 05/09/2008 Shingrix discussed. Please contact your pharmacy for coverage information.     Covid-19: Done 10/25/19 and 11/15/19, 05/19/2020 and 04/08/2021.  Advanced directives: Please bring a copy of your health care power of attorney and living will to the office to be added to your chart at your convenience.   Conditions/risks identified: KEEP UP THE GOOD WORK!!!  Next appointment: Follow up in one year for your annual wellness visit. 2024.  Preventive Care 75 Years and Older, Male  Preventive care refers to lifestyle choices and visits with your health care provider that can promote health and wellness. What does preventive care include? A yearly physical exam. This is also called an annual well check. Dental exams once or twice a year. Routine eye exams. Ask your health care provider how often you should have your eyes checked. Personal lifestyle choices, including: Daily care of your teeth and gums. Regular physical activity. Eating a healthy diet. Avoiding tobacco and drug use. Limiting alcohol use. Practicing safe sex. Taking low doses of aspirin every day. Taking vitamin and mineral supplements as recommended by your health care provider. What happens during an annual well check? The services and screenings done by your health care  provider during your annual well check will depend on your age, overall health, lifestyle risk factors, and family history of disease. Counseling  Your health care provider may ask you questions about your: Alcohol use. Tobacco use. Drug use. Emotional well-being. Home and relationship well-being. Sexual activity. Eating habits. History of falls. Memory and ability to understand (cognition). Work and work Statistician. Screening  You may have the following tests or measurements: Height, weight, and BMI. Blood pressure. Lipid and cholesterol levels. These may be checked every 5 years, or more frequently if you are over 75 years old. Skin check. Lung cancer screening. You may have this screening every year starting at age 68 if you have a 30-pack-year history of smoking and currently smoke or have quit within the past 15 years. Fecal occult blood test (FOBT) of the stool. You may have this test every year starting at age 41. Flexible sigmoidoscopy or colonoscopy. You may have a sigmoidoscopy every 5 years or a colonoscopy every 10 years starting at age 75. Prostate cancer screening. Recommendations will vary depending on your family history and other risks. Hepatitis C blood test. Hepatitis B blood test. Sexually transmitted disease (STD) testing. Diabetes screening. This is done by checking your blood sugar (glucose) after you have not eaten for a while (fasting). You may have this done every 1-3 years. Abdominal aortic aneurysm (AAA) screening. You may need this if you are a current or former smoker. Osteoporosis. You may be screened starting at age 34 if you are at high risk. Talk with your health care provider about your test results, treatment options, and if necessary, the need for more tests. Vaccines  Your health care provider may recommend certain vaccines, such as:  Influenza vaccine. This is recommended every year. Tetanus, diphtheria, and acellular pertussis (Tdap, Td)  vaccine. You may need a Td booster every 10 years. Zoster vaccine. You may need this after age 63. Pneumococcal 13-valent conjugate (PCV13) vaccine. One dose is recommended after age 14. Pneumococcal polysaccharide (PPSV23) vaccine. One dose is recommended after age 15. Talk to your health care provider about which screenings and vaccines you need and how often you need them. This information is not intended to replace advice given to you by your health care provider. Make sure you discuss any questions you have with your health care provider. Document Released: 10/03/2015 Document Revised: 05/26/2016 Document Reviewed: 07/08/2015 Elsevier Interactive Patient Education  2017 [place] Prevention in the Home Falls can cause injuries. They can happen to people of all ages. There are many things you can do to make your home safe and to help prevent falls. What can I do on the outside of my home? Regularly fix the edges of walkways and driveways and fix any cracks. Remove anything that might make you trip as you walk through a door, such as a raised step or threshold. Trim any bushes or trees on the path to your home. Use bright outdoor lighting. Clear any walking paths of anything that might make someone trip, such as rocks or tools. Regularly check to see if handrails are loose or broken. Make sure that both sides of any steps have handrails. Any raised decks and porches should have guardrails on the edges. Have any leaves, snow, or ice cleared regularly. Use sand or salt on walking paths during winter. Clean up any spills in your garage right away. This includes oil or grease spills. What can I do in the bathroom? Use night lights. Install grab bars by the toilet and in the tub and shower. Do not use towel bars as grab bars. Use non-skid mats or decals in the tub or shower. If you need to sit down in the shower, use a plastic, non-slip stool. Keep the floor dry. Clean up any  water that spills on the floor as soon as it happens. Remove soap buildup in the tub or shower regularly. Attach bath mats securely with double-sided non-slip rug tape. Do not have throw rugs and other things on the floor that can make you trip. What can I do in the bedroom? Use night lights. Make sure that you have a light by your bed that is easy to reach. Do not use any sheets or blankets that are too big for your bed. They should not hang down onto the floor. Have a firm chair that has side arms. You can use this for support while you get dressed. Do not have throw rugs and other things on the floor that can make you trip. What can I do in the kitchen? Clean up any spills right away. Avoid walking on wet floors. Keep items that you use a lot in easy-to-reach places. If you need to reach something above you, use a strong step stool that has a grab bar. Keep electrical cords out of the way. Do not use floor polish or wax that makes floors slippery. If you must use wax, use non-skid floor wax. Do not have throw rugs and other things on the floor that can make you trip. What can I do with my stairs? Do not leave any items on the stairs. Make sure that there are handrails on both sides of the stairs and use them.  Fix handrails that are broken or loose. Make sure that handrails are as long as the stairways. Check any carpeting to make sure that it is firmly attached to the stairs. Fix any carpet that is loose or worn. Avoid having throw rugs at the top or bottom of the stairs. If you do have throw rugs, attach them to the floor with carpet tape. Make sure that you have a light switch at the top of the stairs and the bottom of the stairs. If you do not have them, ask someone to add them for you. What else can I do to help prevent falls? Wear shoes that: Do not have high heels. Have rubber bottoms. Are comfortable and fit you well. Are closed at the toe. Do not wear sandals. If you use a  stepladder: Make sure that it is fully opened. Do not climb a closed stepladder. Make sure that both sides of the stepladder are locked into place. Ask someone to hold it for you, if possible. Clearly mark and make sure that you can see: Any grab bars or handrails. First and last steps. Where the edge of each step is. Use tools that help you move around (mobility aids) if they are needed. These include: Canes. Walkers. Scooters. Crutches. Turn on the lights when you go into a dark area. Replace any light bulbs as soon as they burn out. Set up your furniture so you have a clear path. Avoid moving your furniture around. If any of your floors are uneven, fix them. If there are any pets around you, be aware of where they are. Review your medicines with your doctor. Some medicines can make you feel dizzy. This can increase your chance of falling. Ask your doctor what other things that you can do to help prevent falls. This information is not intended to replace advice given to you by your health care provider. Make sure you discuss any questions you have with your health care provider. Document Released: 07/03/2009 Document Revised: 02/12/2016 Document Reviewed: 10/11/2014 Elsevier Interactive Patient Education  2017 [employer].

## 2021-10-20 DIAGNOSIS — L738 Other specified follicular disorders: Secondary | ICD-10-CM | POA: Diagnosis not present

## 2021-10-20 DIAGNOSIS — L309 Dermatitis, unspecified: Secondary | ICD-10-CM | POA: Diagnosis not present

## 2021-11-04 ENCOUNTER — Encounter: Payer: Self-pay | Admitting: Family Medicine

## 2021-11-23 ENCOUNTER — Ambulatory Visit: Payer: Medicare HMO | Admitting: Cardiology

## 2021-11-23 ENCOUNTER — Other Ambulatory Visit: Payer: Self-pay

## 2021-11-23 ENCOUNTER — Encounter: Payer: Self-pay | Admitting: Cardiology

## 2021-11-23 VITALS — BP 126/80 | HR 66 | Ht 71.0 in | Wt 209.2 lb

## 2021-11-23 DIAGNOSIS — I480 Paroxysmal atrial fibrillation: Secondary | ICD-10-CM

## 2021-11-23 NOTE — Progress Notes (Signed)
Electrophysiology Office Note   Date:  11/23/2021   ID:  Jimmy Bradshaw, DOB 1945/12/10, MRN [id number]  PCP:  Susy Frizzle, MD  Cardiologist:  Nah[place] Primary Electrophysiologist:  Davaris Youtsey Meredith Leeds, MD    Chief Complaint: atrial flutter   History of Present Illness: Jimmy Bradshaw is a 76 y.o. male who is being seen today for the evaluation of AF at the request of Susy Frizzle, MD. Presenting today for electrophysiology evaluation.  He has a history significant for paroxysmal atrial fibrillation, atrial flutter, hypertension, hyperlipidemia, diabetes.  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  He was seen in cardiology clinic and was noted to be in atrial fibrillation.  He was unaware of the arrhythmia.  He is now status post atrial fibrillation ablation 08/05/2021.  Since ablation he has done well with less shortness of breath.  Unfortunately he had pericarditis after ablation but this has since improved.  Today, denies symptoms of palpitations, chest pain, shortness of breath, orthopnea, PND, lower extremity edema, claudication, dizziness, presyncope, syncope, bleeding, or neurologic sequela. The patient is tolerating medications without difficulties.     Past Medical History:  Diagnosis Date   Anxiety    Arthritis    HANDS,  SHOULDERS   Bladder neck obstruction    BPH (benign prostatic hypertrophy)    Frequency of urination    H/O: gout    History of colon polyps    Hyperlipidemia    Nocturia    PAF (paroxysmal atrial fibrillation) ([place])    Type 2 diabetes mellitus ([place])    diet controlled   Urgency of urination    Wears hearing aid    BILATERAL   Past Surgical History:  Procedure Laterality Date   ATRIAL FIBRILLATION ABLATION N/A 08/05/2021   Procedure: ATRIAL FIBRILLATION ABLATION;  Surgeon: Constance Haw, MD;  Location: [place] [facility];  Service: Cardiovascular;  Laterality: N/A;   CARDIOVERSION  N/A 10/26/2016   Procedure: CARDIOVERSION;  Surgeon: Larey Dresser, MD;  Location: [place];  Service: Cardiovascular;  Laterality: N/A;   CARDIOVERSION N/A 04/17/2019   Procedure: CARDIOVERSION;  Surgeon: Sueanne Margarita, MD;  Location: [facility];  Service: Cardiovascular;  Laterality: N/A;   COLONOSCOPY  08-30-2011, 2017   I & D KNEE WITH POLY EXCHANGE Left 10/08/2014   Procedure: IRRIGATION AND DEBRIDEMENT LEFT UNI KNEE ARTHOPLASTY  WITH POLY EXCHANGE;  Surgeon: Mauri Pole, MD;  Location: [facility];  Service: Orthopedics;  Laterality: Left;   KNEE ARTHROSCOPY Left 11/ 2013   PARTIAL KNEE ARTHROPLASTY Left 02/05/2013   Procedure: LEFT KNEE MEDIAL UNICOMPARTMENTAL KNEE;  Surgeon: Mauri Pole, MD;  Location: [facility];  Service: Orthopedics;  Laterality: Left;   PICC LINE INSERTION     and removed later   TRANSURETHRAL RESECTION OF PROSTATE N/A 09/30/2014   Procedure: TRANSURETHRAL RESECTION OF THE PROSTATE WITH GYRUS INSTRUMENTS;  Surgeon: Bernestine Amass, MD;  Location: [facility];  Service: Urology;  Laterality: N/A;   TRANSURETHRAL RESECTION OF PROSTATE N/A 07/03/2020   Procedure: TRANSURETHRAL RESECTION OF THE PROSTATE (TURP);  Surgeon: Franchot Gallo, MD;  Location: [facility];  Service: Urology;  Laterality: N/A;  1 HR     Current Outpatient Medications  Medication Sig Dispense Refill   allopurinol (ZYLOPRIM) 100 MG tablet TAKE 1 TABLET EVERY DAY (TOTAL '400MG'$ /DAY) 90 tablet 1   allopurinol (ZYLOPRIM) 300 MG tablet Take 1 tablet (300 mg total)  by mouth daily. 90 tablet 1   apixaban (ELIQUIS) 5 MG TABS tablet TAKE 1 TABLET (5 MG) BY MOUTH 2 (TWO) TIMES DAILY. 180 tablet 1   buPROPion (WELLBUTRIN XL) 300 MG 24 hr tablet Take 1 tablet (300 mg total) by mouth daily. 90 tablet 3   colchicine 0.6 MG tablet Take 1 tablet (0.6 mg total) by mouth 2 (two) times daily for 5 days. (Patient taking differently: Take 0.6 mg by mouth 2 (two) times daily as needed  (gout).) 10 tablet 0   lisinopril (ZESTRIL) 2.5 MG tablet Take 1 tablet (2.5 mg total) by mouth daily. 90 tablet 3   metoprolol succinate (TOPROL-XL) 25 MG 24 hr tablet Take 1.5 tablets (37.5 mg total) by mouth daily. 135 tablet 2   rosuvastatin (CRESTOR) 10 MG tablet Take 1 tablet (10 mg total) by mouth daily. 90 tablet 3   No current facility-administered medications for this visit.    Allergies:   Pravastatin and Penicillins   Social History:  The patient  reports that he quit smoking about 22 years ago. His smoking use included cigarettes. He has a 37.50 pack-year smoking history. He has been exposed to tobacco smoke. He has never used smokeless tobacco. He reports current alcohol use of about 1.0 standard drink per week. He reports that he does not use drugs.   Family History:  The patient's family history includes Colon cancer in his father. He was adopted.   ROS:  Please see the history of present illness.   Otherwise, review of systems is positive for none.   All other systems are reviewed and negative.   PHYSICAL EXAM: VS:  BP 126/80    Pulse 66    Ht '5\' 11"'$  (1.803 m)    Wt 209 lb 3.2 oz (94.9 kg)    SpO2 97%    BMI 29.18 kg/m  , BMI Body mass index is 29.18 kg/m. GEN: Well nourished, well developed, in no acute distress  HEENT: normal  Neck: no JVD, carotid bruits, or masses Cardiac: RRR; no murmurs, rubs, or gallops,no edema  Respiratory:  clear to auscultation bilaterally, normal work of breathing GI: soft, nontender, nondistended, + BS MS: no deformity or atrophy  Skin: warm and dry Neuro:  Strength and sensation are intact Psych: euthymic mood, full affect  EKG:  EKG is ordered today. Personal review of the ekg ordered shows sinus rhythm  Recent Labs: 08/06/2021: BUN 19; Creatinine, Ser 1.30; Hemoglobin 13.0; Platelets 188; Potassium 3.6; Sodium 136    Lipid Panel     Component Value Date/Time   CHOL 126 01/03/2020 0847   TRIG 103 01/03/2020 0847   HDL 32 (L)  01/03/2020 0847   CHOLHDL 3.9 01/03/2020 0847   CHOLHDL 3.4 03/03/2018 1158   VLDL 26 10/18/2016 0844   LDLCALC 75 01/03/2020 0847   LDLCALC 78 03/03/2018 1158     Wt Readings from Last 3 Encounters:  11/23/21 209 lb 3.2 oz (94.9 kg)  10/15/21 209 lb 6.4 oz (95 kg)  10/06/21 218 lb (98.9 kg)      Other studies Reviewed: Additional studies/ records that were reviewed today include: TTE 2020  Review of the above records today demonstrates:   1. The left ventricle has normal systolic function with an ejection  fraction of 60-65%. The cavity size was normal. Left ventricular diastolic  parameters were normal.   2. The right ventricle has normal systolic function. The cavity was  normal. There is no increase in right ventricular  wall thickness.   3. Left atrial size was mildly dilated.   4. The mitral valve is normal in structure. There is mild mitral annular  calcification present.   5. The tricuspid valve is normal in structure.   6. The aortic valve is tricuspid Moderate thickening of the aortic valve  Moderate calcification of the aortic valve.   7. The pulmonic valve was normal in structure.    ASSESSMENT AND PLAN:  1.  Paroxysmal atrial fibrillation/typical atrial flutter: CHA2DS2-VASc of 3.  Currently on Eliquis 5 mg twice daily, flecainide 50 mg twice daily, Toprol-XL 25 mg daily.  He is now status post ablation 08/05/2021.  He had short episodes of atrial fibrillation after ablation but has had no further.  We Addilee Neu stop his flecainide today.  He would like to stop his Eliquis.  I have told him that he needs a few more months to determine if he is going to have more episodes of atrial fibrillation.  2.  Hypertension: Currently well controlled  3.  Hyperlipidemia: Continue Crestor per primary cardiology   Current medicines are reviewed at length with the patient today.   The patient does not have concerns regarding his medicines.  The following changes were made today:  Stop flecainide  Labs/ tests ordered today include:  Orders Placed This Encounter  Procedures   EKG 12-Lead     Disposition:   FU with EP app 3 months  Signed, Darek Eifler Meredith Leeds, MD  11/23/2021 3:56 PM     [address] [place] [address] [phone] (office) [phone] (fax)

## 2021-11-23 NOTE — Patient Instructions (Signed)
Medication Instructions:  ?STOP Flecainide ? ?*If you need a refill on your cardiac medications before your next appointment, please call your pharmacy* ? ? ?Lab Work: ?None ordered ?I ? ? ?Follow-Up: ?At [facility], you and your health needs are our priority.  As part of our continuing mission to provide you with exceptional heart care, we have created designated Provider Care Teams.  These Care Teams include your primary Cardiologist (physician) and Advanced Practice Providers (APPs -  Physician Assistants and Nurse Practitioners) who all work together to provide you with the care you need, when you need it. ? ?We recommend signing up for the patient portal called "MyChart".  Sign up information is provided on this After Visit Summary.  MyChart is used to connect with patients for Virtual Visits (Telemedicine).  Patients are able to view lab/test results, encounter notes, upcoming appointments, etc.  Non-urgent messages can be sent to your provider as well.   ?To learn more about what you can do with MyChart, go to [email or web address].   ? ? ? ?Your next appointment:   ?3 month(s) ? ? ?The format for your next appointment:   ?In Person ? ? ?Provider:   ? ?EP APP  ?Tommye Standard, PA or Oda Kilts, [place] ?

## 2021-11-27 DIAGNOSIS — R197 Diarrhea, unspecified: Secondary | ICD-10-CM | POA: Diagnosis not present

## 2021-11-27 DIAGNOSIS — R194 Change in bowel habit: Secondary | ICD-10-CM | POA: Diagnosis not present

## 2021-11-27 DIAGNOSIS — Z7901 Long term (current) use of anticoagulants: Secondary | ICD-10-CM | POA: Diagnosis not present

## 2021-11-30 ENCOUNTER — Telehealth: Payer: Self-pay | Admitting: *Deleted

## 2021-11-30 DIAGNOSIS — I700 Atherosclerosis of aorta: Secondary | ICD-10-CM

## 2021-11-30 DIAGNOSIS — R194 Change in bowel habit: Secondary | ICD-10-CM | POA: Diagnosis not present

## 2021-11-30 DIAGNOSIS — R197 Diarrhea, unspecified: Secondary | ICD-10-CM | POA: Diagnosis not present

## 2021-11-30 NOTE — Telephone Encounter (Signed)
? ?  Pre-operative Risk Assessment  ?  ?Patient Name: Jimmy Bradshaw  ?DOB: 22-Sep-1945 ?MRN: [id number]  ? ?  ? ?Request for Surgical Clearance   ? ?Procedure:   COLONOSCOPY ? ?Date of Surgery:  Clearance 12/16/21                              ?   ?Surgeon:  DR. Michail Sermon ?Surgeon's Group or Practice Name:  [facility] ?Phone number:  [phone] ?Fax number:  [phone] ?  ?Type of Clearance Requested:   ?- Medical  ?- Pharmacy:  Hold Apixaban (Eliquis)   ?  ?Type of Anesthesia:   PROPOFOL  ?  ?Additional requests/questions:   ? ?Signed, ?Julaine Hua   ?11/30/2021, 11:16 AM  ? ?

## 2021-12-01 DIAGNOSIS — I700 Atherosclerosis of aorta: Secondary | ICD-10-CM | POA: Insufficient documentation

## 2021-12-01 NOTE — Telephone Encounter (Signed)
Patient with diagnosis of afib on Eliquis for anticoagulation.   ? ?Procedure: colonoscopy ?Date of procedure: 12/16/21 ? ?CHA2DS2-VASc Score = 4  ?This indicates a 4.8% annual risk of stroke. ?The patient's score is based upon: ?CHF History: 0 ?HTN History: 1 ?Diabetes History: 1 ?Stroke History: 0 ?Vascular Disease History: 1 ?Age Score: 1 ?Gender Score: 0 ?  ?Underwent afib ablation 08/05/21. ? ?CrCl 14m/min ?Platelet count 188K ? ?Per office protocol, patient can hold Eliquis for 1-2 days prior to procedure.   ?

## 2021-12-01 NOTE — Telephone Encounter (Signed)
Clinical pharmacist to review Eliquis 

## 2021-12-02 ENCOUNTER — Telehealth: Payer: Self-pay | Admitting: Cardiovascular Disease

## 2021-12-02 NOTE — Telephone Encounter (Signed)
Follow Up: ? ? ? ? ?Patient is returning Hao's call from today. ?

## 2021-12-02 NOTE — Telephone Encounter (Signed)
Left a message for the patient to call back and speak to the on-call preop APP of the day 

## 2021-12-02 NOTE — Telephone Encounter (Signed)
? ?  Name: Jimmy Bradshaw  ?DOB: [birth year]  ?MRN: [id number]  ? ?Primary Cardiologist: Mertie Moores, MD ? ?Chart reviewed as part of pre-operative protocol coverage. Patient was contacted 12/02/2021 in reference to pre-operative risk assessment for pending surgery as outlined below.  KEL SENN was last seen on 11/23/2021 by Dr. Curt Bears.  Since that day, PAITON FOSCO has done well without exertional chest pain or worsening dyspnea. ? ?Therefore, based on ACC/AHA guidelines, the patient would be at acceptable risk for the planned procedure without further cardiovascular testing.  ? ?Patient may hold Eliquis for 2 days prior to the procedure and restart as soon as possible afterward at the surgeon's discretion. ? ?The patient was advised that if he develops new symptoms prior to surgery to contact our office to arrange for a follow-up visit, and he verbalized understanding. ? ?I will route this recommendation to the requesting party via Epic fax function and remove from pre-op pool. Please call with questions. ? ?Almyra Deforest, [place] ?12/02/2021, 12:44 PM ? ?

## 2021-12-08 DIAGNOSIS — L309 Dermatitis, unspecified: Secondary | ICD-10-CM | POA: Diagnosis not present

## 2021-12-16 DIAGNOSIS — K5730 Diverticulosis of large intestine without perforation or abscess without bleeding: Secondary | ICD-10-CM | POA: Diagnosis not present

## 2021-12-16 DIAGNOSIS — K52832 Lymphocytic colitis: Secondary | ICD-10-CM | POA: Diagnosis not present

## 2021-12-16 DIAGNOSIS — R197 Diarrhea, unspecified: Secondary | ICD-10-CM | POA: Diagnosis not present

## 2021-12-16 DIAGNOSIS — K6389 Other specified diseases of intestine: Secondary | ICD-10-CM | POA: Diagnosis not present

## 2021-12-16 DIAGNOSIS — K648 Other hemorrhoids: Secondary | ICD-10-CM | POA: Diagnosis not present

## 2021-12-16 DIAGNOSIS — K929 Disease of digestive system, unspecified: Secondary | ICD-10-CM | POA: Diagnosis not present

## 2021-12-18 DIAGNOSIS — K52832 Lymphocytic colitis: Secondary | ICD-10-CM | POA: Diagnosis not present

## 2022-01-11 ENCOUNTER — Other Ambulatory Visit: Payer: Self-pay | Admitting: Family Medicine

## 2022-01-11 ENCOUNTER — Other Ambulatory Visit: Payer: Self-pay

## 2022-02-17 NOTE — Progress Notes (Signed)
PCP:  Jimmy Frizzle, MD Primary Cardiologist: Mertie Moores, MD Electrophysiologist: Constance Haw, MD   Jimmy Bradshaw is a 76 y.o. male seen today for Jimmy Meredith Leeds, MD for routine electrophysiology followup.  Since last being seen in our clinic the patient reports doing very well. He has not noted any AF by symptoms or by watch interrogation. He checks his apple watch twice daily.  he denies chest pain, palpitations, dyspnea, PND, orthopnea, nausea, vomiting, dizziness, syncope, edema, weight gain, or early satiety.  Past Medical History:  Diagnosis Date   Anxiety    Arthritis    HANDS,  SHOULDERS   Bladder neck obstruction    BPH (benign prostatic hypertrophy)    Frequency of urination    H/O: gout    History of colon polyps    Hyperlipidemia    Nocturia    PAF (paroxysmal atrial fibrillation) ([place])    Type 2 diabetes mellitus ([place])    diet controlled   Urgency of urination    Wears hearing aid    BILATERAL   Past Surgical History:  Procedure Laterality Date   ATRIAL FIBRILLATION ABLATION N/A 08/05/2021   Procedure: ATRIAL FIBRILLATION ABLATION;  Surgeon: Constance Haw, MD;  Location: [place] [facility];  Service: Cardiovascular;  Laterality: N/A;   CARDIOVERSION N/A 10/26/2016   Procedure: CARDIOVERSION;  Surgeon: Larey Dresser, MD;  Location: [place];  Service: Cardiovascular;  Laterality: N/A;   CARDIOVERSION N/A 04/17/2019   Procedure: CARDIOVERSION;  Surgeon: Sueanne Margarita, MD;  Location: [facility];  Service: Cardiovascular;  Laterality: N/A;   COLONOSCOPY  08-30-2011, 2017   I & D KNEE WITH POLY EXCHANGE Left 10/08/2014   Procedure: IRRIGATION AND DEBRIDEMENT LEFT UNI KNEE ARTHOPLASTY  WITH POLY EXCHANGE;  Surgeon: Mauri Pole, MD;  Location: [facility];  Service: Orthopedics;  Laterality: Left;   KNEE ARTHROSCOPY Left 11/ 2013   PARTIAL KNEE ARTHROPLASTY Left 02/05/2013   Procedure: LEFT KNEE MEDIAL UNICOMPARTMENTAL KNEE;  Surgeon:  Mauri Pole, MD;  Location: [facility];  Service: Orthopedics;  Laterality: Left;   PICC LINE INSERTION     and removed later   TRANSURETHRAL RESECTION OF PROSTATE N/A 09/30/2014   Procedure: TRANSURETHRAL RESECTION OF THE PROSTATE WITH GYRUS INSTRUMENTS;  Surgeon: Bernestine Amass, MD;  Location: [facility];  Service: Urology;  Laterality: N/A;   TRANSURETHRAL RESECTION OF PROSTATE N/A 07/03/2020   Procedure: TRANSURETHRAL RESECTION OF THE PROSTATE (TURP);  Surgeon: Franchot Gallo, MD;  Location: [facility];  Service: Urology;  Laterality: N/A;  1 HR    Current Outpatient Medications  Medication Sig Dispense Refill   allopurinol (ZYLOPRIM) 100 MG tablet TAKE 1 TABLET EVERY DAY (TOTAL '400MG'$ /DAY) 90 tablet 1   allopurinol (ZYLOPRIM) 300 MG tablet TAKE 1 TABLET EVERY DAY (TOTAL '400MG'$ /DAY) 90 tablet 1   apixaban (ELIQUIS) 5 MG TABS tablet TAKE 1 TABLET (5 MG) BY MOUTH 2 (TWO) TIMES DAILY. 180 tablet 1   augmented betamethasone dipropionate (DIPROLENE-AF) 0.05 % ointment SMARTSIG:sparingly Topical Daily     BISACODYL 5 MG EC tablet See admin instructions.     buPROPion (WELLBUTRIN XL) 300 MG 24 hr tablet Take 1 tablet (300 mg total) by mouth daily. 90 tablet 3   colchicine 0.6 MG tablet Take 1 tablet (0.6 mg total) by mouth 2 (two) times daily for 5 days. (Patient taking differently: Take 0.6 mg by mouth 2 (two) times daily as needed (gout).) 10 tablet 0  GAVILYTE-G 236 g solution See admin instructions.     lisinopril (ZESTRIL) 2.5 MG tablet Take 1 tablet (2.5 mg total) by mouth daily. 90 tablet 3   metoprolol succinate (TOPROL-XL) 25 MG 24 hr tablet Take 1.5 tablets (37.5 mg total) by mouth daily. 135 tablet 2   rosuvastatin (CRESTOR) 10 MG tablet Take 1 tablet (10 mg total) by mouth daily. 90 tablet 3   budesonide (ENTOCORT EC) 3 MG 24 hr capsule Take 9 mg by mouth daily. (Patient not taking: Reported on 02/23/2022)     flecainide (TAMBOCOR) 50 MG tablet  (Patient  not taking: Reported on 02/23/2022)     predniSONE (DELTASONE) 10 MG tablet Take by mouth. (Patient not taking: Reported on 02/23/2022)     triamcinolone cream (KENALOG) 0.1 %  (Patient not taking: Reported on 02/23/2022)     No current facility-administered medications for this visit.    Allergies  Allergen Reactions   Pravastatin Other (See Comments)    Headache and legs hurt   Penicillins Rash    Reaction as a child. Tolerated a penicillin shot w/o reaction 2018    Social History   Socioeconomic History   Marital status: Married    Spouse name: Not on file   Number of children: Not on file   Years of education: Not on file   Highest education level: Not on file  Occupational History   Not on file  Tobacco Use   Smoking status: Former    Packs/day: 1.50    Years: 25.00    Pack years: 37.50    Types: Cigarettes    Quit date: 12/08/1998    Years since quitting: 23.2    Passive exposure: Past   Smokeless tobacco: Never   Tobacco comments:    Former smoker 09/02/21  Vaping Use   Vaping Use: Never used  Substance and Sexual Activity   Alcohol use: Yes    Alcohol/week: 1.0 standard drink    Types: 1 Glasses of wine per week    Comment: RARE - a sip, occassional   Drug use: No   Sexual activity: Not Currently  Other Topics Concern   Not on file  Social History Narrative   Not on file   Social Determinants of Health   Financial Resource Strain: Low Risk    Difficulty of Paying Living Expenses: Not hard at all  Food Insecurity: No Food Insecurity   Worried About Charity fundraiser in the Last Year: Never true   Ran Out of Food in the Last Year: Never true  Transportation Needs: No Transportation Needs   Lack of Transportation (Medical): No   Lack of Transportation (Non-Medical): No  Physical Activity: Sufficiently Active   Days of Exercise per Week: 4 days   Minutes of Exercise per Session: 40 min  Stress: No Stress Concern Present   Feeling of Stress : Not at all   Social Connections: Socially Integrated   Frequency of Communication with Friends and Family: More than three times a week   Frequency of Social Gatherings with Friends and Family: More than three times a week   Attends Religious Services: 1 to 4 times per year   Active Member of [employer] or Organizations: Yes   Attends Music therapist: More than 4 times per year   Marital Status: Married  Human resources officer Violence: Not At Risk   Fear of Current or Ex-Partner: No   Emotionally Abused: No   Physically Abused: No   Sexually  Abused: No     Review of Systems: All other systems reviewed and are otherwise negative except as noted above.  Physical Exam: Vitals:   02/23/22 1155  BP: 136/84  Pulse: 61  Weight: 209 lb 9.6 oz (95.1 kg)  Height: '5\' 11"'$  (1.803 m)    GEN- The patient is well appearing, alert and oriented x 3 today.   HEENT: normocephalic, atraumatic; sclera clear, conjunctiva pink; hearing intact; oropharynx clear; neck supple, no JVP Lymph- no cervical lymphadenopathy Lungs- Clear to ausculation bilaterally, normal work of breathing.  No wheezes, rales, rhonchi Heart- Regular rate and rhythm, no murmurs, rubs or gallops, PMI not laterally displaced GI- soft, non-tender, non-distended, bowel sounds present, no hepatosplenomegaly Extremities- no clubbing, cyanosis, or edema; DP/PT/radial pulses 2+ bilaterally MS- no significant deformity or atrophy Skin- warm and dry, no rash or lesion Psych- euthymic mood, full affect Neuro- strength and sensation are intact  EKG is ordered. Personal review of EKG from today shows NSR at 61 bpm  Additional studies reviewed include: Previous EP office notes.   Assessment and Plan:  1. PAF/ typical atrial flutter CHA2DS2VASc of 3 In shared decision making, pt wishes to stop Eliquis. He Jimmy continue to follow closely via his apple watch twice daily. If AF recurs, he Jimmy restart his Eliquis and let us know. I discussed  personally with Dr. Ileana Ladd who agrees with this plan.  Off flecainide Continue toprol 25 daily EKG today shows NSR   2. HTN Stable on current regimen   3. HLD Continue crestor per primary cardiology  Follow up with Dr. Curt Bears in 6 months   Shirley Friar, PA-C  02/23/22 12:04 PM

## 2022-02-23 ENCOUNTER — Encounter: Payer: Self-pay | Admitting: Student

## 2022-02-23 ENCOUNTER — Ambulatory Visit: Payer: Medicare HMO | Admitting: Student

## 2022-02-23 VITALS — BP 136/84 | HR 61 | Ht 71.0 in | Wt 209.6 lb

## 2022-02-23 DIAGNOSIS — I4892 Unspecified atrial flutter: Secondary | ICD-10-CM | POA: Diagnosis not present

## 2022-02-23 DIAGNOSIS — I10 Essential (primary) hypertension: Secondary | ICD-10-CM | POA: Diagnosis not present

## 2022-02-23 DIAGNOSIS — D6869 Other thrombophilia: Secondary | ICD-10-CM

## 2022-02-23 DIAGNOSIS — I480 Paroxysmal atrial fibrillation: Secondary | ICD-10-CM

## 2022-02-23 NOTE — Patient Instructions (Addendum)
Medication Instructions:  Your physician has recommended you make the following change in your medication:   DISCONTINUE: Eliquis  *If you need a refill on your cardiac medications before your next appointment, please call your pharmacy*   Lab Work: None If you have labs (blood work) drawn today and your tests are completely normal, you will receive your results only by: [facility] (if you have MyChart) OR A paper copy in the mail If you have any lab test that is abnormal or we need to change your treatment, we will call you to review the results.   Follow-Up: At [facility], you and your health needs are our priority.  As part of our continuing mission to provide you with exceptional heart care, we have created designated Provider Care Teams.  These Care Teams include your primary Cardiologist (physician) and Advanced Practice Providers (APPs -  Physician Assistants and Nurse Practitioners) who all work together to provide you with the care you need, when you need it.   Your next appointment:   6 month(s)  The format for your next appointment:   In Person  Provider:   Allegra Lai, MD

## 2022-04-09 ENCOUNTER — Ambulatory Visit: Payer: Self-pay | Admitting: *Deleted

## 2022-04-09 DIAGNOSIS — N400 Enlarged prostate without lower urinary tract symptoms: Secondary | ICD-10-CM | POA: Diagnosis not present

## 2022-04-09 DIAGNOSIS — N401 Enlarged prostate with lower urinary tract symptoms: Secondary | ICD-10-CM | POA: Diagnosis not present

## 2022-04-09 DIAGNOSIS — R102 Pelvic and perineal pain: Secondary | ICD-10-CM | POA: Diagnosis not present

## 2022-04-09 NOTE — Telephone Encounter (Signed)
Message from Santina Evans, [place] sent at 04/09/2022  9:01 AM EDT  Summary: pjulled muscle   Pt called in stating that he may have pulled a muscle in his groin area.  MQ#[phone]           Call History   Type Contact Phone/Fax User  04/09/2022 09:00 AM EDT Phone (Incoming) [name], [name]" (Self) [phone] Jerilynn Mages) Santina Evans, NT

## 2022-04-09 NOTE — Telephone Encounter (Signed)
I returned pt's call pertaining to pain in his groin area.   He was on his way to the urologist now.   "I called my urologist and they had a cancellation so I'm on my way there now".    "I was in so much pain I decided to call them too and they were able to get me in". He mentioned he will let Dr. Dennard Schaumann know what the urologist advises.

## 2022-04-18 ENCOUNTER — Other Ambulatory Visit: Payer: Self-pay | Admitting: Family Medicine

## 2022-04-20 ENCOUNTER — Encounter: Payer: Self-pay | Admitting: Cardiovascular Disease

## 2022-04-20 ENCOUNTER — Ambulatory Visit: Payer: Medicare HMO | Admitting: Cardiovascular Disease

## 2022-04-20 VITALS — BP 124/84 | HR 66 | Ht 71.0 in | Wt 210.6 lb

## 2022-04-20 DIAGNOSIS — I480 Paroxysmal atrial fibrillation: Secondary | ICD-10-CM | POA: Diagnosis not present

## 2022-04-20 NOTE — Telephone Encounter (Signed)
Requested medication (s) are due for refill today- expired Rx  Requested medication (s) are on the active medication list -yes  Future visit scheduled -no  Last refill: rosuvastatin 04/10/21 #90 3RF                  Bupropion 03/05/21 #90 3RF  Notes to clinic: expired Rx, fails lab protocol- over 1 year  Requested Prescriptions  Pending Prescriptions Disp Refills   rosuvastatin (CRESTOR) 10 MG tablet [Pharmacy Med Name: ROSUVASTATIN CALCIUM 10 MG Tablet] 90 tablet 3    Sig: TAKE 1 TABLET EVERY DAY     Cardiovascular:  Antilipid - Statins 2 Failed - 04/18/2022  5:22 PM      Failed - Cr in normal range and within 360 days    Creat  Date Value Ref Range Status  10/17/2020 1.48 (H) 0.70 - 1.18 mg/dL Final    Comment:    For patients >67 years of age, the reference limit for Creatinine is approximately 13% higher for people identified as African-American. .    Creatinine, Ser  Date Value Ref Range Status  08/06/2021 1.30 (H) 0.61 - 1.24 mg/dL Final   Creatinine, Urine  Date Value Ref Range Status  08/11/2018 117 20 - 320 mg/dL Final         Failed - Lipid Panel in normal range within the last 12 months    Cholesterol, Total  Date Value Ref Range Status  01/03/2020 126 100 - 199 mg/dL Final   LDL Cholesterol (Calc)  Date Value Ref Range Status  03/03/2018 78 mg/dL (calc) Final    Comment:    Reference range: <100 . Desirable range <100 mg/dL for primary prevention;   <70 mg/dL for patients with CHD or diabetic patients  with > or = 2 CHD risk factors. Marland Kitchen LDL-C is now calculated using the Martin-Hopkins  calculation, which is a validated novel method providing  better accuracy than the Friedewald equation in the  estimation of LDL-C.  Cresenciano Genre et al. Annamaria Helling. 2992;426(83): 2061-2068  (http://education.[facility].com/faq/FAQ164)    LDL Chol Calc (NIH)  Date Value Ref Range Status  01/03/2020 75 0 - 99 mg/dL Final   HDL  Date Value Ref Range Status   01/03/2020 32 (L) >39 mg/dL Final   Triglycerides  Date Value Ref Range Status  01/03/2020 103 0 - 149 mg/dL Final         Passed - Patient is not pregnant      Passed - Valid encounter within last 12 months    Recent Outpatient Visits           6 months ago [place], Cammie Mcgee, MD   1 year ago Chronic left shoulder pain   [facility], Cammie Mcgee, MD   1 year ago Chronic fatigue   [place] Dennard Schaumann, Cammie Mcgee, MD   1 year ago Chronic left shoulder pain   [place] Susy Frizzle, MD   2 years ago Type 2 diabetes mellitus with diabetic neuropathy, without long-term current use of insulin ([place])   [place], Modena Nunnery, MD       Future Appointments             In 4 months [place], Ocie Doyne, MD [place], [facility]             buPROPion (WELLBUTRIN XL) 300 MG 24 hr tablet [  Pharmacy Med Name: BUPROPION HYDROCHLORIDE ER (XL) 300 MG Tablet Extended Release 24 Hour] 90 tablet 3    Sig: TAKE 1 TABLET EVERY DAY     Psychiatry: Antidepressants - bupropion Failed - 04/18/2022  5:22 PM      Failed - Cr in normal range and within 360 days    Creat  Date Value Ref Range Status  10/17/2020 1.48 (H) 0.70 - 1.18 mg/dL Final    Comment:    For patients >23 years of age, the reference limit for Creatinine is approximately 13% higher for people identified as African-American. .    Creatinine, Ser  Date Value Ref Range Status  08/06/2021 1.30 (H) 0.61 - 1.24 mg/dL Final   Creatinine, Urine  Date Value Ref Range Status  08/11/2018 117 20 - 320 mg/dL Final         Failed - AST in normal range and within 360 days    AST  Date Value Ref Range Status  10/17/2020 16 10 - 35 U/L Final         Failed - ALT in normal range and within 360 days    ALT  Date Value Ref Range Status  10/17/2020 17 9 - 46 U/L Final         Failed  - Valid encounter within last 6 months    Recent Outpatient Visits           6 months ago [place], Cammie Mcgee, MD   1 year ago Chronic left shoulder pain   [place] Pickard, Cammie Mcgee, MD   1 year ago Chronic fatigue   [place] Susy Frizzle, MD   1 year ago Chronic left shoulder pain   [place] Susy Frizzle, MD   2 years ago Type 2 diabetes mellitus with diabetic neuropathy, without long-term current use of insulin ([place])   [place], Modena Nunnery, MD       Future Appointments             In 4 months [place], Ocie Doyne, MD [place], [facility]            Passed - Last BP in normal range    BP Readings from Last 1 Encounters:  02/23/22 136/84            Requested Prescriptions  Pending Prescriptions Disp Refills   rosuvastatin (CRESTOR) 10 MG tablet [Pharmacy Med Name: ROSUVASTATIN CALCIUM 10 MG Tablet] 90 tablet 3    Sig: TAKE 1 TABLET EVERY DAY     Cardiovascular:  Antilipid - Statins 2 Failed - 04/18/2022  5:22 PM      Failed - Cr in normal range and within 360 days    Creat  Date Value Ref Range Status  10/17/2020 1.48 (H) 0.70 - 1.18 mg/dL Final    Comment:    For patients >31 years of age, the reference limit for Creatinine is approximately 13% higher for people identified as African-American. .    Creatinine, Ser  Date Value Ref Range Status  08/06/2021 1.30 (H) 0.61 - 1.24 mg/dL Final   Creatinine, Urine  Date Value Ref Range Status  08/11/2018 117 20 - 320 mg/dL Final         Failed - Lipid Panel in normal range within the last 12 months    Cholesterol, Total  Date Value Ref Range Status  01/03/2020  126 100 - 199 mg/dL Final   LDL Cholesterol (Calc)  Date Value Ref Range Status  03/03/2018 78 mg/dL (calc) Final    Comment:    Reference range: <100 . Desirable range <100 mg/dL for primary  prevention;   <70 mg/dL for patients with CHD or diabetic patients  with > or = 2 CHD risk factors. Marland Kitchen LDL-C is now calculated using the Martin-Hopkins  calculation, which is a validated novel method providing  better accuracy than the Friedewald equation in the  estimation of LDL-C.  Cresenciano Genre et al. Annamaria Helling. 8841;660(63): 2061-2068  (http://education.[facility].com/faq/FAQ164)    LDL Chol Calc (NIH)  Date Value Ref Range Status  01/03/2020 75 0 - 99 mg/dL Final   HDL  Date Value Ref Range Status  01/03/2020 32 (L) >39 mg/dL Final   Triglycerides  Date Value Ref Range Status  01/03/2020 103 0 - 149 mg/dL Final         Passed - Patient is not pregnant      Passed - Valid encounter within last 12 months    Recent Outpatient Visits           6 months ago [place], Cammie Mcgee, MD   1 year ago Chronic left shoulder pain   [facility], Cammie Mcgee, MD   1 year ago Chronic fatigue   [place] Susy Frizzle, MD   1 year ago Chronic left shoulder pain   [facility] Susy Frizzle, MD   2 years ago Type 2 diabetes mellitus with diabetic neuropathy, without long-term current use of insulin ([place])   [place], Modena Nunnery, MD       Future Appointments             In 4 months [place], Ocie Doyne, MD [place], [facility]             buPROPion (WELLBUTRIN XL) 300 MG 24 hr tablet [Pharmacy Med Name: BUPROPION HYDROCHLORIDE ER (XL) 300 MG Tablet Extended Release 24 Hour] 90 tablet 3    Sig: TAKE 1 TABLET EVERY DAY     Psychiatry: Antidepressants - bupropion Failed - 04/18/2022  5:22 PM      Failed - Cr in normal range and within 360 days    Creat  Date Value Ref Range Status  10/17/2020 1.48 (H) 0.70 - 1.18 mg/dL Final    Comment:    For patients >60 years of age, the reference limit for Creatinine is approximately 13%  higher for people identified as African-American. .    Creatinine, Ser  Date Value Ref Range Status  08/06/2021 1.30 (H) 0.61 - 1.24 mg/dL Final   Creatinine, Urine  Date Value Ref Range Status  08/11/2018 117 20 - 320 mg/dL Final         Failed - AST in normal range and within 360 days    AST  Date Value Ref Range Status  10/17/2020 16 10 - 35 U/L Final         Failed - ALT in normal range and within 360 days    ALT  Date Value Ref Range Status  10/17/2020 17 9 - 46 U/L Final         Failed - Valid encounter within last 6 months    Recent Outpatient Visits           6 months ago COVID  [place] Pickard, Cammie Mcgee, MD   1 year ago Chronic left shoulder pain   [facility] Schaumann, Cammie Mcgee, MD   1 year ago Chronic fatigue   [place] [facility] Schaumann, Cammie Mcgee, MD   1 year ago Chronic left shoulder pain   [place] Susy Frizzle, MD   2 years ago Type 2 diabetes mellitus with diabetic neuropathy, without long-term current use of insulin ([place])   [place], Modena Nunnery, MD       Future Appointments             In 4 months [place], MD [place], [facility]            Passed - Last BP in normal range    BP Readings from Last 1 Encounters:  02/23/22 136/84

## 2022-04-20 NOTE — Progress Notes (Signed)
Patient ID: CAP MASSI, male   DOB: 10-08-1945, 76 y.o.   MRN: [id number]    Date:  04/20/2022   ID:  Jimmy Bradshaw, DOB 1946-02-16, MRN [id number]  PCP:  Susy Frizzle, MD  Primary Cardiologist:  Kayelyn Lemon  Chief Complaint  Patient presents with   Atrial Fibrillation   Problem list: 1. Paroxysmal atrial fibrillation  - CHADS2VASC score of 3  ( age, HTN, DM)  2. Hypertension 3. Hyperlipidemia 4. Diabetes mellitus 5. BPH - s/p TURP 6. S/p left TKA  - with supsequent infection of his prosthesis requiring re-do surgery .     Jimmy Bradshaw is a 76 y.o. male history of hypertension, hyperlipidemia, type 2 diabetes mellitus, anxiety, BPH. On 09/30/2014 he underwent TURP procedure and then a week later underwent irrigation and debridement of the left knee.  PICC line was placed and he was started on antibiotics for 4 weeks.  One of these surgeries patient supposedly went into atrial fibrillation with a rapid ventricular response. There are no EKGs or telemetry strips which confirmed this nor is there any documentation in the notes; either in the discharge summaries or with anesthesia.   The patient currently denies nausea, vomiting, fever, chest pain, shortness of breath, orthopnea, dizziness, PND, cough, congestion, abdominal pain, hematochezia, melena, lower extremity edema, claudication.   Feb. 19, 2016   He was getting his echo today and went in to rapid afib.  Still asymptomatic.  Was added on to my schedule.   November 21, 2014:  Jimmy Bradshaw is seen back today for follow up of his paroxysmal atrial fib.    We started him on Xarelto . Unfortunately, he had some bleeding in his urine .  He had just had recent prostate surgery ( Jimmy Bradshaw) .   He held his Xarelto for the next week.  He is doing better.   March 11, 2015: Jimmy Bradshaw has a hx of paroxysmal atrial fib. Has Eliquis but has not started it yet  His hematura has resolved.  Checks his pulse and has not had recurrent atrial fib to his  knowledge   Doing well.  Is having some leg pain.    Thinks it may be due to the pravachol  Still having knee problems .    Jan. 17, 2017:  Jimmy Bradshaw is doing well He has paroxysmal afib  - is back in Afib today - cannot tell that his HR is irregular.  No CP , no dyspnea.  He is not having any further urinary bleeding . His knee infection has resolved.   Jan. 30, 2018:  Jimmy Bradshaw is seen for visit  Dr. Buelah Manis increased the Toprol XL to 50 mg a day   January 10, 2017:  Jimmy Bradshaw is seen today for follow up of his atrial fib.  He had a cardioversion Feb. 6, 2018: GXT  Feb. 13, 2018 showed mild widening of his QRS with peak exercise. We decreased his flecainide from 100 mg twice a day to 50 mg twice a day at that point. In on Flecainide 50 mg BID , metoprolol 25 mg a day and Eliquis  Feels well Left knee replacement is working well   Dec. 19, 2018:  No CP or dysnea.   Maintaining  NSR  Is watching his diet.   Walks with dogs.    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  Having some vision issues.   Saw Dr. Sherrilyn Rist ( at [facility]) Being referred to [facility] for [place] eval   March 20, 2018:  He was seen today for follow-up of his paroxysmal atrial fibrillation, HTN, HLD .  Jimmy Bradshaw  He has maintained normal sinus rhythm.  He is on flecainide 50 mg twice a day. Has been having some issues with his vision.  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    Monitoring for now  No CP , no dyspnea,   Exercising  Running dogs in agility training .  Bearded collies.   Feb. 24, 2020:  Jimmy Bradshaw is seen today for follow-up of his hypertension, hyperlipidemia, paroxysmal atrial fibrillation.  He apparently has had some fluid retention recently and was started on Lasix. His last echocardiogram was in 2016 which revealed normal left ventricular systolic function with an ejection fraction of 55 to 60%.  We were not able to assess his diastolic function because of the presence of atrial fibrillation.  He started  having swelling in his feet.  Right > left  Started improving ( even before starting lasix )  Started lasix  Does not think he eats too much salt  Exercises regularly ,  - 3 days a week does agility training   No chest pain  Just DOE after running the dogs.   April 03, 2019: Jimmy Bradshaw is seen today for a follow-up visit.  He is back in atrial flutter today. He is completely asymptomatic.  He is been doing well.  He does his normal activities without any difficulty.  Blood pressure and heart rate are normal. Had 2 glasses of tea last night No Etoh recently  No missed dose of flecainide   July 05, 2019: Jimmy Bradshaw is seen today for follow-up of his atrial flutter. Patient has been successfully cardioverted again as of April 17, 2019. Feels well.     January 03, 2020 Jimmy Bradshaw is seen today for follow up of his atrial flutter and HTN Was last by Jimmy Bradshaw last summer.  Has had both covid vaccines.   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  Has some neuropathy issues.   April 22, 2021: Jimmy Bradshaw is seen today Is in atrial flutter today .  He cannot tell if he is out of rhym  We discussed ablation   Aug. 1, 2023 Jimmy Bradshaw is seen today  Hx of atrial flutter, HTN  Had a flutter ablation in Nov. 2022 Was in the ED with dyspnea and CP the next day  Had developed colchicine Much better with colchicine     Getting some exercise Had some heat intolerance  Had an Afib ablation .   Eliquis was stopped   Exercising regularly , silver sneakers .    Wt Readings from Last 3 Encounters:  04/20/22 210 lb 9.6 oz (95.5 kg)  02/23/22 209 lb 9.6 oz (95.1 kg)  11/23/21 209 lb 3.2 oz (94.9 kg)     Past Medical History:  Diagnosis Date   Anxiety    Arthritis    HANDS,  SHOULDERS   Bladder neck obstruction    BPH (benign prostatic hypertrophy)    Frequency of urination    H/O: gout    History of colon polyps    Hyperlipidemia     Nocturia    PAF (paroxysmal atrial fibrillation) ([place])    Type 2 diabetes mellitus ([place])    diet controlled   Urgency of urination    Wears hearing aid    BILATERAL  Current Outpatient Medications  Medication Sig Dispense Refill   allopurinol (ZYLOPRIM) 100 MG tablet TAKE 1 TABLET EVERY DAY (TOTAL '400MG'$ /DAY) 90 tablet 1   allopurinol (ZYLOPRIM) 300 MG tablet TAKE 1 TABLET EVERY DAY (TOTAL '400MG'$ /DAY) 90 tablet 1   buPROPion (WELLBUTRIN XL) 300 MG 24 hr tablet Take 1 tablet (300 mg total) by mouth daily. 90 tablet 3   flecainide (TAMBOCOR) 50 MG tablet      lisinopril (ZESTRIL) 2.5 MG tablet Take 1 tablet (2.5 mg total) by mouth daily. 90 tablet 3   metoprolol succinate (TOPROL-XL) 25 MG 24 hr tablet Take 1.5 tablets (37.5 mg total) by mouth daily. 135 tablet 2   rosuvastatin (CRESTOR) 10 MG tablet Take 1 tablet (10 mg total) by mouth daily. 90 tablet 3   triamcinolone cream (KENALOG) 0.1 %      augmented betamethasone dipropionate (DIPROLENE-AF) 0.05 % ointment SMARTSIG:sparingly Topical Daily (Patient not taking: Reported on 04/20/2022)     BISACODYL 5 MG EC tablet See admin instructions. (Patient not taking: Reported on 04/20/2022)     budesonide (ENTOCORT EC) 3 MG 24 hr capsule Take 9 mg by mouth daily. (Patient not taking: Reported on 02/23/2022)     colchicine 0.6 MG tablet Take 0.6 mg by mouth 2 (two) times daily as needed (gout). (Patient not taking: Reported on 04/20/2022)     predniSONE (DELTASONE) 10 MG tablet Take by mouth. (Patient not taking: Reported on 02/23/2022)     No current facility-administered medications for this visit.    Allergies:    Allergies  Allergen Reactions   Pravastatin Other (See Comments)    Headache and legs hurt   Penicillins Rash    Reaction as a child. Tolerated a penicillin shot w/o reaction 2018    Social History:  The patient  reports that he quit smoking about 23 years ago. His smoking use included cigarettes. He has a 37.50 pack-year smoking  history. He has been exposed to tobacco smoke. He has never used smokeless tobacco. He reports current alcohol use of about 1.0 standard drink of alcohol per week. He reports that he does not use drugs.   Family history:   Family History  Adopted: Yes  Problem Relation Age of Onset   Colon cancer Father     ROS:  Please see the history of present illness.  All other systems reviewed and negative.    Physical Exam: Blood pressure 124/84, pulse 66, height '5\' 11"'$  (1.803 m), weight 210 lb 9.6 oz (95.5 kg), SpO2 97 %.  GEN:  Well nourished, well developed in no acute distress HEENT: Normal NECK: No JVD; No carotid bruits LYMPHATICS: No lymphadenopathy CARDIAC: RRR , no murmurs, rubs, gallops RESPIRATORY:  Clear to auscultation without rales, wheezing or rhonchi  ABDOMEN: Soft, non-tender, non-distended MUSCULOSKELETAL:  No edema; No deformity  SKIN: Warm and dry NEUROLOGIC:  Alert and oriented x 3    EKG:       ASSESSMENT AND PLAN:   1.    Paroxysmal Atrial flutter :  CHADS 2VASC score of 3 ( Age, HTN, DM) . S/p ablation  Doing weoo      3. Essential hypertension:   well controlled   4. Hyperlipidemia:      5.   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 Is followed by Annia Belt, MD and at [facility] ( Dr. Frederica Kuster)  .      Thayer Headings, [name], MD, [facility] 04/20/2022, 1:25 PM [address],  [place] Pager [phone]

## 2022-04-20 NOTE — Patient Instructions (Signed)
Medication Instructions:  Your physician recommends that you continue on your current medications as directed. Please refer to the Current Medication list given to you today.  *If you need a refill on your cardiac medications before your next appointment, please call your pharmacy*   Lab Work: NONE If you have labs (blood work) drawn today and your tests are completely normal, you will receive your results only by: [place] (if you have MyChart) OR A paper copy in the mail If you have any lab test that is abnormal or we need to change your treatment, we will call you to review the results.   Testing/Procedures: NONE   Follow-Up: At [facility], you and your health needs are our priority.  As part of our continuing mission to provide you with exceptional heart care, we have created designated Provider Care Teams.  These Care Teams include your primary Cardiologist (physician) and Advanced Practice Providers (APPs -  Physician Assistants and Nurse Practitioners) who all work together to provide you with the care you need, when you need it.  We recommend signing up for the patient portal called "MyChart".  Sign up information is provided on this After Visit Summary.  MyChart is used to connect with patients for Virtual Visits (Telemedicine).  Patients are able to view lab/test results, encounter notes, upcoming appointments, etc.  Non-urgent messages can be sent to your provider as well.   To learn more about what you can do with MyChart, go to [email or web address].    Your next appointment:   1 year(s)  The format for your next appointment:   In Person  Provider:   Quentin Mulling, or [place] {     Other Instructions *PCP list provided. You may also call [facility] '@[email or web address]'$ [phone] for assistance**  Important Information About Sugar

## 2022-04-26 ENCOUNTER — Telehealth: Payer: Self-pay | Admitting: Cardiovascular Disease

## 2022-04-26 NOTE — Telephone Encounter (Signed)
Returned call to patient. Patient states he has been passing some blood and small clots in his urine. He is schedule to see a urologist 04/30/22. Reviewed patient medications and he is not currently on any anticoagulants. He stopped taking Eliquis in June of 2023. Advised patient to follow up as scheduled with urology as this does not appear to be associated with his current medications or his ablation procedure from 2022.  Patient verbalized understanding and will follow up with urology as planned and Dr. Curt Bears as scheduled in December.

## 2022-04-26 NOTE — Telephone Encounter (Signed)
   Pt said, he is sill passing small blood clots to in his urine. He wanted to get recommendations from Dr. Acie Fredrickson, he said he will see his urologist on friday

## 2022-04-30 DIAGNOSIS — R102 Pelvic and perineal pain: Secondary | ICD-10-CM | POA: Diagnosis not present

## 2022-04-30 DIAGNOSIS — R310 Gross hematuria: Secondary | ICD-10-CM | POA: Diagnosis not present

## 2022-05-06 DIAGNOSIS — R319 Hematuria, unspecified: Secondary | ICD-10-CM | POA: Diagnosis not present

## 2022-05-06 DIAGNOSIS — R310 Gross hematuria: Secondary | ICD-10-CM | POA: Diagnosis not present

## 2022-05-06 DIAGNOSIS — K5790 Diverticulosis of intestine, part unspecified, without perforation or abscess without bleeding: Secondary | ICD-10-CM | POA: Diagnosis not present

## 2022-05-13 ENCOUNTER — Telehealth: Payer: Self-pay

## 2022-05-13 ENCOUNTER — Other Ambulatory Visit: Payer: Self-pay | Admitting: Family Medicine

## 2022-05-13 ENCOUNTER — Other Ambulatory Visit: Payer: Medicare HMO

## 2022-05-13 DIAGNOSIS — R5382 Chronic fatigue, unspecified: Secondary | ICD-10-CM | POA: Diagnosis not present

## 2022-05-13 DIAGNOSIS — I10 Essential (primary) hypertension: Secondary | ICD-10-CM | POA: Diagnosis not present

## 2022-05-13 DIAGNOSIS — E1140 Type 2 diabetes mellitus with diabetic neuropathy, unspecified: Secondary | ICD-10-CM | POA: Diagnosis not present

## 2022-05-13 DIAGNOSIS — E1149 Type 2 diabetes mellitus with other diabetic neurological complication: Secondary | ICD-10-CM | POA: Diagnosis not present

## 2022-05-13 DIAGNOSIS — N289 Disorder of kidney and ureter, unspecified: Secondary | ICD-10-CM

## 2022-05-13 DIAGNOSIS — E782 Mixed hyperlipidemia: Secondary | ICD-10-CM

## 2022-05-13 MED ORDER — ZOLPIDEM TARTRATE 10 MG PO TABS: 10.0000 mg | ORAL_TABLET | Freq: Every evening | ORAL | 1 refills | Status: DC | PRN

## 2022-05-13 NOTE — Telephone Encounter (Signed)
Pt states he is having issues with muscle aching to upper thighs x 1 month. Pt also states he had and episode of bleeding with urination. Pt states he saw Dr. Marthann Schiller, had CT and was dx with a kidney stone in his kidney. Pt states he is not sure if the muscle aching is related to the kidney stone but he is not sleeping because of the aching. Pt asks if he can get something to help him sleep until he sees you next Tuesday? Thanks.

## 2022-05-14 LAB — LIPID PANEL
Cholesterol: 124 mg/dL (ref <200)
HDL: 31 mg/dL — ABNORMAL LOW (ref >=40)
LDL Cholesterol (Calc): 75 mg/dL (calc)
Non-HDL Cholesterol (Calc): 93 mg/dL (calc) (ref <130)
Total CHOL/HDL Ratio: 4 (calc) (ref <5.0)
Triglycerides: 97 mg/dL (ref <150)

## 2022-05-14 LAB — CBC WITH DIFFERENTIAL/PLATELET
Absolute Monocytes: 694 cells/uL (ref 200–950)
Basophils Absolute: 29 cells/uL (ref 0–200)
Basophils Relative: 0.4 %
Eosinophils Absolute: 219 cells/uL (ref 15–500)
Eosinophils Relative: 3 %
HCT: 41.1 % (ref 38.5–50.0)
Hemoglobin: 13.6 g/dL (ref 13.2–17.1)
Lymphs Abs: 1723 cells/uL (ref 850–3900)
MCH: 31.8 pg (ref 27.0–33.0)
MCHC: 33.1 g/dL (ref 32.0–36.0)
MCV: 96 fL (ref 80.0–100.0)
MPV: 10.5 fL (ref 7.5–12.5)
Monocytes Relative: 9.5 %
Neutro Abs: 4636 cells/uL (ref 1500–7800)
Neutrophils Relative %: 63.5 %
Platelets: 251 10*3/uL (ref 140–400)
RBC: 4.28 10*6/uL (ref 4.20–5.80)
RDW: 12.8 % (ref 11.0–15.0)
Total Lymphocyte: 23.6 %
WBC: 7.3 10*3/uL (ref 3.8–10.8)

## 2022-05-14 LAB — COMPREHENSIVE METABOLIC PANEL
AG Ratio: 1.6 (calc) (ref 1.0–2.5)
ALT: 13 U/L (ref 9–46)
AST: 17 U/L (ref 10–35)
Albumin: 3.8 g/dL (ref 3.6–5.1)
Alkaline phosphatase (APISO): 72 U/L (ref 35–144)
BUN: 19 mg/dL (ref 7–25)
CO2: 25 mmol/L (ref 20–32)
Calcium: 9.1 mg/dL (ref 8.6–10.3)
Chloride: 106 mmol/L (ref 98–110)
Creat: 1.15 mg/dL (ref 0.70–1.28)
Globulin: 2.4 g/dL (calc) (ref 1.9–3.7)
Glucose, Bld: 97 mg/dL (ref 65–99)
Potassium: 4.9 mmol/L (ref 3.5–5.3)
Sodium: 141 mmol/L (ref 135–146)
Total Bilirubin: 0.5 mg/dL (ref 0.2–1.2)
Total Protein: 6.2 g/dL (ref 6.1–8.1)

## 2022-05-14 LAB — TSH: TSH: 3.72 mIU/L (ref 0.40–4.50)

## 2022-05-14 LAB — HEMOGLOBIN A1C
Hgb A1c MFr Bld: 6 % of total Hgb — ABNORMAL HIGH (ref <5.7)
Mean Plasma Glucose: 126 mg/dL
eAG (mmol/L): 7 mmol/L

## 2022-05-18 ENCOUNTER — Ambulatory Visit (INDEPENDENT_AMBULATORY_CARE_PROVIDER_SITE_OTHER): Payer: Medicare HMO | Admitting: Family Medicine

## 2022-05-18 VITALS — BP 118/62 | HR 57 | Temp 97.9°F | Ht 71.0 in | Wt 211.0 lb

## 2022-05-18 DIAGNOSIS — Z0000 Encounter for general adult medical examination without abnormal findings: Secondary | ICD-10-CM | POA: Diagnosis not present

## 2022-05-18 DIAGNOSIS — N401 Enlarged prostate with lower urinary tract symptoms: Secondary | ICD-10-CM | POA: Diagnosis not present

## 2022-05-18 DIAGNOSIS — N138 Other obstructive and reflux uropathy: Secondary | ICD-10-CM

## 2022-05-18 DIAGNOSIS — I480 Paroxysmal atrial fibrillation: Secondary | ICD-10-CM | POA: Diagnosis not present

## 2022-05-18 DIAGNOSIS — I10 Essential (primary) hypertension: Secondary | ICD-10-CM

## 2022-05-18 DIAGNOSIS — E1140 Type 2 diabetes mellitus with diabetic neuropathy, unspecified: Secondary | ICD-10-CM | POA: Diagnosis not present

## 2022-05-18 MED ORDER — CYCLOBENZAPRINE HCL 10 MG PO TABS: 10.0000 mg | ORAL_TABLET | Freq: Three times a day (TID) | ORAL | 0 refills | Status: DC | PRN

## 2022-05-18 NOTE — Progress Notes (Signed)
Subjective:    Patient ID: Jimmy Bradshaw, male    DOB: 1946/02/09, 76 y.o.   MRN: [id number]  HPI  Patient is a very pleasant 76 year old Caucasian male here today for CPE.  Patient does report some pain in his right hamstring.  He injured it a few weeks ago.  He has pain with hip flexion and with climbing steps.  Outside of that he is pain-free.  He is doing relatively well.  He is due for a flu shot, COVID booster.  His colonoscopy was performed within the last year and is up-to-date.  His PSA was recently checked at his urologist and is up-to-date.  His blood pressure is outstanding.  His most recent lab work is listed below and overall looks very good including a hemoglobin A1c of 6.0. Lab on 05/13/2022  Component Date Value Ref Range Status   WBC 05/13/2022 7.3  3.8 - 10.8 Thousand/uL Final   RBC 05/13/2022 4.28  4.20 - 5.80 Million/uL Final   Hemoglobin 05/13/2022 13.6  13.2 - 17.1 g/dL Final   HCT 05/13/2022 41.1  38.5 - 50.0 % Final   MCV 05/13/2022 96.0  80.0 - 100.0 fL Final   MCH 05/13/2022 31.8  27.0 - 33.0 pg Final   MCHC 05/13/2022 33.1  32.0 - 36.0 g/dL Final   RDW 05/13/2022 12.8  11.0 - 15.0 % Final   Platelets 05/13/2022 251  140 - 400 Thousand/uL Final   MPV 05/13/2022 10.5  7.5 - 12.5 fL Final   Neutro Abs 05/13/2022 4,636  1,500 - 7,800 cells/uL Final   Lymphs Abs 05/13/2022 1,723  850 - 3,900 cells/uL Final   Absolute Monocytes 05/13/2022 694  200 - 950 cells/uL Final   Eosinophils Absolute 05/13/2022 219  15 - 500 cells/uL Final   Basophils Absolute 05/13/2022 29  0 - 200 cells/uL Final   Neutrophils Relative % 05/13/2022 63.5  % Final   Total Lymphocyte 05/13/2022 23.6  % Final   Monocytes Relative 05/13/2022 9.5  % Final   Eosinophils Relative 05/13/2022 3.0  % Final   Basophils Relative 05/13/2022 0.4  % Final   Glucose, Bld 05/13/2022 97  65 - 99 mg/dL Final   Comment: .            Fasting reference interval .    BUN 05/13/2022 19  7 - 25 mg/dL Final    Creat 05/13/2022 1.15  0.70 - 1.28 mg/dL Final   BUN/Creatinine Ratio 05/13/2022 SEE NOTE:  6 - 22 (calc) Final   Comment:    Not Reported: BUN and Creatinine are within    reference range. .    Sodium 05/13/2022 141  135 - 146 mmol/L Final   Potassium 05/13/2022 4.9  3.5 - 5.3 mmol/L Final   Chloride 05/13/2022 106  98 - 110 mmol/L Final   CO2 05/13/2022 25  20 - 32 mmol/L Final   Calcium 05/13/2022 9.1  8.6 - 10.3 mg/dL Final   Total Protein 05/13/2022 6.2  6.1 - 8.1 g/dL Final   Albumin 05/13/2022 3.8  3.6 - 5.1 g/dL Final   Globulin 05/13/2022 2.4  1.9 - 3.7 g/dL (calc) Final   AG Ratio 05/13/2022 1.6  1.0 - 2.5 (calc) Final   Total Bilirubin 05/13/2022 0.5  0.2 - 1.2 mg/dL Final   Alkaline phosphatase (APISO) 05/13/2022 72  35 - 144 U/L Final   AST 05/13/2022 17  10 - 35 U/L Final   ALT 05/13/2022 13  9 - 46  U/L Final   Hgb A1c MFr Bld 05/13/2022 6.0 (H)  <5.7 % of total Hgb Final   Comment: For someone without known diabetes, a hemoglobin  A1c value between 5.7% and 6.4% is consistent with prediabetes and should be confirmed with a  follow-up test. . For someone with known diabetes, a value <7% indicates that their diabetes is well controlled. A1c targets should be individualized based on duration of diabetes, age, comorbid conditions, and other considerations. . This assay result is consistent with an increased risk of diabetes. . Currently, no consensus exists regarding use of hemoglobin A1c for diagnosis of diabetes for children. .    Mean Plasma Glucose 05/13/2022 126  mg/dL Final   eAG (mmol/L) 05/13/2022 7.0  mmol/L Final   Cholesterol 05/13/2022 124  <200 mg/dL Final   HDL 05/13/2022 31 (L)  > OR = 40 mg/dL Final   Triglycerides 05/13/2022 97  <150 mg/dL Final   LDL Cholesterol (Calc) 05/13/2022 75  mg/dL (calc) Final   Comment: Reference range: <100 . Desirable range <100 mg/dL for primary prevention;   <70 mg/dL for patients with CHD or diabetic patients   with > or = 2 CHD risk factors. Marland Kitchen LDL-C is now calculated using the Martin-Hopkins  calculation, which is a validated novel method providing  better accuracy than the Friedewald equation in the  estimation of LDL-C.  Cresenciano Genre et al. Annamaria Helling. 4580;998(33): 2061-2068  (http://education.[facility].com/faq/FAQ164)    Total CHOL/HDL Ratio 05/13/2022 4.0  <5.0 (calc) Final   Non-HDL Cholesterol (Calc) 05/13/2022 93  <130 mg/dL (calc) Final   Comment: For patients with diabetes plus 1 major ASCVD risk  factor, treating to a non-HDL-C goal of <100 mg/dL  (LDL-C of <70 mg/dL) is considered a therapeutic  option.    TSH 05/13/2022 3.72  0.40 - 4.50 mIU/L Final    Past Medical History:  Diagnosis Date   Anxiety    Arthritis    HANDS,  SHOULDERS   Bladder neck obstruction    BPH (benign prostatic hypertrophy)    Frequency of urination    H/O: gout    History of colon polyps    Hyperlipidemia    Nocturia    PAF (paroxysmal atrial fibrillation) ([place])    Type 2 diabetes mellitus ([place])    diet controlled   Urgency of urination    Wears hearing aid    BILATERAL   Past Surgical History:  Procedure Laterality Date   ATRIAL FIBRILLATION ABLATION N/A 08/05/2021   Procedure: ATRIAL FIBRILLATION ABLATION;  Surgeon: Constance Haw, MD;  Location: [facility];  Service: Cardiovascular;  Laterality: N/A;   CARDIOVERSION N/A 10/26/2016   Procedure: CARDIOVERSION;  Surgeon: Larey Dresser, MD;  Location: [facility];  Service: Cardiovascular;  Laterality: N/A;   CARDIOVERSION N/A 04/17/2019   Procedure: CARDIOVERSION;  Surgeon: Sueanne Margarita, MD;  Location: [facility];  Service: Cardiovascular;  Laterality: N/A;   COLONOSCOPY  08-30-2011, 2017   I & D KNEE WITH POLY EXCHANGE Left 10/08/2014   Procedure: IRRIGATION AND DEBRIDEMENT LEFT UNI KNEE ARTHOPLASTY  WITH POLY EXCHANGE;  Surgeon: Mauri Pole, MD;  Location: [facility];  Service: Orthopedics;  Laterality: Left;   KNEE  ARTHROSCOPY Left 11/ 2013   PARTIAL KNEE ARTHROPLASTY Left 02/05/2013   Procedure: LEFT KNEE MEDIAL UNICOMPARTMENTAL KNEE;  Surgeon: Mauri Pole, MD;  Location: [facility];  Service: Orthopedics;  Laterality: Left;   PICC LINE INSERTION     and removed later  TRANSURETHRAL RESECTION OF PROSTATE N/A 09/30/2014   Procedure: TRANSURETHRAL RESECTION OF THE PROSTATE WITH GYRUS INSTRUMENTS;  Surgeon: Bernestine Amass, MD;  [facility];  Service: Urology;  Laterality: N/A;   TRANSURETHRAL RESECTION OF PROSTATE N/A 07/03/2020   Procedure: TRANSURETHRAL RESECTION OF THE PROSTATE (TURP);  Surgeon: Franchot Gallo, MD;  Location: [facility];  Service: Urology;  Laterality: N/A;  1 HR   Current Outpatient Medications on File Prior to Visit  Medication Sig Dispense Refill   allopurinol (ZYLOPRIM) 100 MG tablet TAKE 1 TABLET EVERY DAY (TOTAL '400MG'$ /DAY) 90 tablet 1   allopurinol (ZYLOPRIM) 300 MG tablet TAKE 1 TABLET EVERY DAY (TOTAL '400MG'$ /DAY) 90 tablet 1   augmented betamethasone dipropionate (DIPROLENE-AF) 0.05 % ointment SMARTSIG:sparingly Topical Daily (Patient not taking: Reported on 04/20/2022)     BISACODYL 5 MG EC tablet See admin instructions. (Patient not taking: Reported on 04/20/2022)     budesonide (ENTOCORT EC) 3 MG 24 hr capsule Take 9 mg by mouth daily. (Patient not taking: Reported on 02/23/2022)     buPROPion (WELLBUTRIN XL) 300 MG 24 hr tablet TAKE 1 TABLET EVERY DAY 90 tablet 3   colchicine 0.6 MG tablet Take 0.6 mg by mouth 2 (two) times daily as needed (gout). (Patient not taking: Reported on 04/20/2022)     flecainide (TAMBOCOR) 50 MG tablet      lisinopril (ZESTRIL) 2.5 MG tablet Take 1 tablet (2.5 mg total) by mouth daily. 90 tablet 3   metoprolol succinate (TOPROL-XL) 25 MG 24 hr tablet Take 1.5 tablets (37.5 mg total) by mouth daily. 135 tablet 2   predniSONE (DELTASONE) 10 MG tablet Take by mouth. (Patient not taking: Reported on 02/23/2022)      rosuvastatin (CRESTOR) 10 MG tablet TAKE 1 TABLET EVERY DAY 90 tablet 3   triamcinolone cream (KENALOG) 0.1 %      zolpidem (AMBIEN) 10 MG tablet Take 1 tablet (10 mg total) by mouth at bedtime as needed for sleep. 15 tablet 1   No current facility-administered medications on file prior to visit.   Allergies  Allergen Reactions   Pravastatin Other (See Comments)    Headache and legs hurt   Penicillins Rash    Reaction as a child. Tolerated a penicillin shot w/o reaction 2018   Social History   Socioeconomic History   Marital status: Married    Spouse name: Not on file   Number of children: Not on file   Years of education: Not on file   Highest education level: Not on file  Occupational History   Not on file  Tobacco Use   Smoking status: Former    Packs/day: 1.50    Years: 25.00    Total pack years: 37.50    Types: Cigarettes    Quit date: 12/08/1998    Years since quitting: 23.4    Passive exposure: Past   Smokeless tobacco: Never   Tobacco comments:    Former smoker 09/02/21  Vaping Use   Vaping Use: Never used  Substance and Sexual Activity   Alcohol use: Yes    Alcohol/week: 1.0 standard drink of alcohol    Types: 1 Glasses of wine per week    Comment: RARE - a sip, occassional   Drug use: No   Sexual activity: Not Currently  Other Topics Concern   Not on file  Social History Narrative   Not on file   Social Determinants of Health   Financial Resource Strain: Low Risk  (  10/15/2021)   Overall Financial Resource Strain (CARDIA)    Difficulty of Paying Living Expenses: Not hard at all  Food Insecurity: No Food Insecurity (10/15/2021)   Hunger Vital Sign    Worried About Running Out of Food in the Last Year: Never true    Ran Out of Food in the Last Year: Never true  Transportation Needs: No Transportation Needs (10/15/2021)   PRAPARE - Hydrologist (Medical): No    Lack of Transportation (Non-Medical): No  Physical Activity:  Sufficiently Active (10/15/2021)   Exercise Vital Sign    Days of Exercise per Week: 4 days    Minutes of Exercise per Session: 40 min  Stress: No Stress Concern Present (10/15/2021)   [place]    Feeling of Stress : Not at all  Social Connections: [place] (10/15/2021)   Social Connection and Isolation Panel [NHANES]    Frequency of Communication with Friends and Family: More than three times a week    Frequency of Social Gatherings with Friends and Family: More than three times a week    Attends Religious Services: 1 to 4 times per year    Active Member of [employer] or Organizations: Yes    Attends Archivist Meetings: More than 4 times per year    Marital Status: Married  Human resources officer Violence: Not At Risk (10/15/2021)   Humiliation, Afraid, Rape, and Kick questionnaire    Fear of Current or Ex-Partner: No    Emotionally Abused: No    Physically Abused: No    Sexually Abused: No      Review of Systems  All other systems reviewed and are negative.      Objective:   Physical Exam Vitals reviewed.  Constitutional:      General: He is not in acute distress.    Appearance: Normal appearance. He is well-developed and normal weight. He is not ill-appearing, toxic-appearing or diaphoretic.  HENT:     Head: Normocephalic and atraumatic.     Nose: Nose normal.     Mouth/Throat:     Pharynx: No oropharyngeal exudate or posterior oropharyngeal erythema.  Eyes:     Conjunctiva/sclera: Conjunctivae normal.     Pupils: Pupils are equal, round, and reactive to light.  Cardiovascular:     Rate and Rhythm: Normal rate and regular rhythm.     Heart sounds: Normal heart sounds. No murmur heard.    No friction rub. No gallop.  Pulmonary:     Effort: Pulmonary effort is normal. No respiratory distress.     Breath sounds: Normal breath sounds. No stridor. No wheezing, rhonchi or rales.  Chest:      Chest wall: No tenderness.  Abdominal:     General: Abdomen is flat. Bowel sounds are normal. There is no distension.     Palpations: Abdomen is soft.     Tenderness: There is no abdominal tenderness. There is no guarding.  Musculoskeletal:     Left shoulder: Tenderness present. Decreased range of motion. Decreased strength.     Right lower leg: No edema.     Left lower leg: No edema.  Lymphadenopathy:     Cervical: No cervical adenopathy.  Neurological:     General: No focal deficit present.     Mental Status: He is alert and oriented to person, place, and time.     Cranial Nerves: No cranial nerve deficit.     Sensory: No sensory  deficit.     Motor: Tremor present. No weakness, atrophy, abnormal muscle tone, seizure activity or pronator drift.     Coordination: Coordination normal.     Gait: Gait normal.     Deep Tendon Reflexes: Reflexes normal.            Assessment & Plan:  Encounter for Medicare annual wellness exam  Type 2 diabetes mellitus with diabetic neuropathy, without long-term current use of insulin ([place])  Primary hypertension  PAF (paroxysmal atrial fibrillation) ([facility])  BPH with urinary obstruction Physical exam today is normal.  Blood pressure is excellent.  I recommended a COVID booster, I recommended a flu shot.  I believe the patient is low risk and does not require the RSV vaccine.  Colon cancer screening and prostate cancer screening are up-to-date.  Cholesterol is outstanding.  I believe the patient strained his hamstring.  I recommended trying a muscle relaxer as needed for pain at night in general stretching.  If not improving I would recommend physical therapy.

## 2022-05-28 DIAGNOSIS — R102 Pelvic and perineal pain: Secondary | ICD-10-CM | POA: Diagnosis not present

## 2022-05-28 DIAGNOSIS — R310 Gross hematuria: Secondary | ICD-10-CM | POA: Diagnosis not present

## 2022-05-28 DIAGNOSIS — N400 Enlarged prostate without lower urinary tract symptoms: Secondary | ICD-10-CM | POA: Diagnosis not present

## 2022-07-01 DIAGNOSIS — H2511 Age-related nuclear cataract, right eye: Secondary | ICD-10-CM | POA: Diagnosis not present

## 2022-07-01 DIAGNOSIS — H0102B Squamous blepharitis left eye, upper and lower eyelids: Secondary | ICD-10-CM | POA: Diagnosis not present

## 2022-07-01 DIAGNOSIS — D3132 Benign neoplasm of left choroid: Secondary | ICD-10-CM | POA: Diagnosis not present

## 2022-07-01 DIAGNOSIS — H11132 Conjunctival pigmentations, left eye: Secondary | ICD-10-CM | POA: Diagnosis not present

## 2022-07-01 DIAGNOSIS — H35371 Puckering of macula, right eye: Secondary | ICD-10-CM | POA: Diagnosis not present

## 2022-07-01 DIAGNOSIS — Z961 Presence of intraocular lens: Secondary | ICD-10-CM | POA: Diagnosis not present

## 2022-07-01 DIAGNOSIS — H0102A Squamous blepharitis right eye, upper and lower eyelids: Secondary | ICD-10-CM | POA: Diagnosis not present

## 2022-07-26 ENCOUNTER — Other Ambulatory Visit: Payer: Self-pay | Admitting: Family Medicine

## 2022-07-26 ENCOUNTER — Other Ambulatory Visit: Payer: Self-pay | Admitting: Cardiology

## 2022-07-28 ENCOUNTER — Other Ambulatory Visit: Payer: Self-pay | Admitting: Family Medicine

## 2022-07-28 ENCOUNTER — Encounter: Payer: Self-pay | Admitting: Family Medicine

## 2022-07-29 NOTE — Telephone Encounter (Signed)
Requested medication (s) are due for refill today:   Yes  Requested medication (s) are on the active medication list:   Yes  Future visit scheduled:   No   Last ordered: 10/02/2021 #90, 1 refill  This has been refused twice due to being requested too soon.   This request is only for 100 mg.   He also should have a 300 mg rx to total 400 mg.     Returning it because per the protocol a uric acid is due so unable to refill.      He has submitted a MyChart note (see chart) wanting to know why this is being refused.   Not sure why the 300 mg dose is not being requested.     Requested Prescriptions  Pending Prescriptions Disp Refills   allopurinol (ZYLOPRIM) 100 MG tablet [Pharmacy Med Name: ALLOPURINOL 100 MG Tablet] 90 tablet 10    Sig: TAKE 1 TABLET EVERY DAY (TOTAL '400MG'$ /DAY)     Endocrinology:  Gout Agents - allopurinol Failed - 07/28/2022  6:28 PM      Failed - Uric Acid in normal range and within 360 days    Uric Acid, Serum  Date Value Ref Range Status  01/23/2020 4.2 4.0 - 8.0 mg/dL Final    Comment:    Therapeutic target for gout patients: <6.0 mg/dL .          Passed - Cr in normal range and within 360 days    Creat  Date Value Ref Range Status  05/13/2022 1.15 0.70 - 1.28 mg/dL Final   Creatinine, Urine  Date Value Ref Range Status  08/11/2018 117 20 - 320 mg/dL Final         Passed - Valid encounter within last 12 months    Recent Outpatient Visits           9 months ago [place], Cammie Mcgee, MD   1 year ago Chronic left shoulder pain   [facility] Dennard Schaumann, Cammie Mcgee, MD   1 year ago Chronic fatigue   [place] Susy Frizzle, MD   2 years ago Chronic left shoulder pain   [place] Susy Frizzle, MD   2 years ago Type 2 diabetes mellitus with diabetic neuropathy, without long-term current use of insulin ([place])   [place], Modena Nunnery, MD        Future Appointments             In 1 month [place], Ocie Doyne, MD [place], [facility]            Passed - CBC within normal limits and completed in the last 12 months    WBC  Date Value Ref Range Status  05/13/2022 7.3 3.8 - 10.8 Thousand/uL Final   RBC  Date Value Ref Range Status  05/13/2022 4.28 4.20 - 5.80 Million/uL Final   Hemoglobin  Date Value Ref Range Status  05/13/2022 13.6 13.2 - 17.1 g/dL Final  07/28/2021 WILL FOLLOW  Preliminary  07/28/2021 15.9 13.0 - 17.7 g/dL Final   HCT  Date Value Ref Range Status  05/13/2022 41.1 38.5 - 50.0 % Final   Hematocrit  Date Value Ref Range Status  07/28/2021 WILL FOLLOW  Preliminary  07/28/2021 46.6 37.5 - 51.0 % Final   MCHC  Date Value Ref Range Status  05/13/2022 33.1 32.0 - 36.0 g/dL Final   [facility]  Date Value Ref Range Status  05/13/2022 31.8 27.0 - 33.0 pg Final   MCV  Date Value Ref Range Status  05/13/2022 96.0 80.0 - 100.0 fL Final  07/28/2021 WILL FOLLOW  Preliminary  07/28/2021 90 79 - 97 fL Final   No results found for: "PLTCOUNTKUC", "LABPLAT", "POCPLA" RDW  Date Value Ref Range Status  05/13/2022 12.8 11.0 - 15.0 % Final  07/28/2021 WILL FOLLOW  Preliminary  07/28/2021 12.9 11.6 - 15.4 % Final

## 2022-08-02 ENCOUNTER — Other Ambulatory Visit: Payer: Self-pay

## 2022-08-02 DIAGNOSIS — M1A09X0 Idiopathic chronic gout, multiple sites, without tophus (tophi): Secondary | ICD-10-CM

## 2022-08-02 MED ORDER — ALLOPURINOL 100 MG PO TABS: ORAL_TABLET | ORAL | 1 refills | Status: DC

## 2022-08-02 MED ORDER — ALLOPURINOL 300 MG PO TABS: ORAL_TABLET | ORAL | 1 refills | Status: DC

## 2022-08-30 ENCOUNTER — Ambulatory Visit: Payer: Medicare HMO | Attending: Cardiology | Admitting: Cardiology

## 2022-08-30 ENCOUNTER — Encounter: Payer: Self-pay | Admitting: Cardiology

## 2022-08-30 VITALS — BP 120/72 | HR 66 | Ht 71.0 in | Wt 203.4 lb

## 2022-08-30 DIAGNOSIS — I483 Typical atrial flutter: Secondary | ICD-10-CM | POA: Diagnosis not present

## 2022-08-30 DIAGNOSIS — I10 Essential (primary) hypertension: Secondary | ICD-10-CM

## 2022-08-30 DIAGNOSIS — D6869 Other thrombophilia: Secondary | ICD-10-CM | POA: Diagnosis not present

## 2022-08-30 DIAGNOSIS — I480 Paroxysmal atrial fibrillation: Secondary | ICD-10-CM | POA: Diagnosis not present

## 2022-08-30 NOTE — Progress Notes (Signed)
Electrophysiology Office Note   Date:  08/30/2022   ID:  TARIS GALINDO, DOB [birth year], MRN [id number]  PCP:  Susy Frizzle, MD  Cardiologist:  [facility] Primary Electrophysiologist:  Rodnisha Blomgren Meredith Leeds, MD    Chief Complaint: atrial flutter   History of Present Illness: Jimmy Bradshaw is a 76 y.o. male who is being seen today for the evaluation of AF at the request of Susy Frizzle, MD. Presenting today for electrophysiology evaluation.  He has a history significant for paroxysmal atrial fibrillation, atrial flutter, hypertension, hyperlipidemia, diabetes.  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  He was noted to be in atrial fibrillation in clinic.  He was unaware of the arrhythmia.  He is post ablation 08/05/2021.  Today, denies symptoms of palpitations, chest pain, shortness of breath, orthopnea, PND, lower extremity edema, claudication, dizziness, presyncope, syncope, bleeding, or neurologic sequela. The patient is tolerating medications without difficulties.  Since his ablation he has done well.  He is noted no further episodes of atrial fibrillation.  He had a GI illness last week with nausea vomiting diarrhea.  He did not have A-fib during that time.  He is lost 11 pounds.  He is just now regaining his appetite.   Past Medical History:  Diagnosis Date   Anxiety    Arthritis    HANDS,  SHOULDERS   Bladder neck obstruction    BPH (benign prostatic hypertrophy)    Frequency of urination    H/O: gout    History of colon polyps    Hyperlipidemia    Nocturia    PAF (paroxysmal atrial fibrillation) ([place])    Type 2 diabetes mellitus ([place])    diet controlled   Urgency of urination    Wears hearing aid    BILATERAL   Past Surgical History:  Procedure Laterality Date   ATRIAL FIBRILLATION ABLATION N/A 08/05/2021   Procedure: ATRIAL FIBRILLATION ABLATION;  Surgeon: Constance Haw, MD;  Location: [facility];   Service: Cardiovascular;  Laterality: N/A;   CARDIOVERSION N/A 10/26/2016   Procedure: CARDIOVERSION;  Surgeon: Larey Dresser, MD;  Location: [facility];  Service: Cardiovascular;  Laterality: N/A;   CARDIOVERSION N/A 04/17/2019   Procedure: CARDIOVERSION;  Surgeon: Sueanne Margarita, MD;  Location: [facility];  Service: Cardiovascular;  Laterality: N/A;   COLONOSCOPY  08-30-2011, 2017   I & D KNEE WITH POLY EXCHANGE Left 10/08/2014   Procedure: IRRIGATION AND DEBRIDEMENT LEFT UNI KNEE ARTHOPLASTY  WITH POLY EXCHANGE;  Surgeon: Mauri Pole, MD;  Location: [facility];  Service: Orthopedics;  Laterality: Left;   KNEE ARTHROSCOPY Left 11/ 2013   PARTIAL KNEE ARTHROPLASTY Left 02/05/2013   Procedure: LEFT KNEE MEDIAL UNICOMPARTMENTAL KNEE;  Surgeon: Mauri Pole, MD;  Location: [facility];  Service: Orthopedics;  Laterality: Left;   PICC LINE INSERTION     and removed later   TRANSURETHRAL RESECTION OF PROSTATE N/A 09/30/2014   Procedure: TRANSURETHRAL RESECTION OF THE PROSTATE WITH GYRUS INSTRUMENTS;  Surgeon: Bernestine Amass, MD;  Location: [facility];  Service: Urology;  Laterality: N/A;   TRANSURETHRAL RESECTION OF PROSTATE N/A 07/03/2020   Procedure: TRANSURETHRAL RESECTION OF THE PROSTATE (TURP);  Surgeon: Franchot Gallo, MD;  Location: [facility];  Service: Urology;  Laterality: N/A;  1 HR     Current Outpatient Medications  Medication Sig Dispense Refill   allopurinol (ZYLOPRIM) 100 MG tablet TAKE 1 TABLET EVERY DAY (  TOTAL '400MG'$ /DAY) 90 tablet 1   allopurinol (ZYLOPRIM) 300 MG tablet TAKE 1 TABLET EVERY DAY (TOTAL '400MG'$ /DAY) 90 tablet 1   budesonide (ENTOCORT EC) 3 MG 24 hr capsule Take 9 mg by mouth daily.     buPROPion (WELLBUTRIN XL) 300 MG 24 hr tablet TAKE 1 TABLET EVERY DAY 90 tablet 3   lisinopril (ZESTRIL) 2.5 MG tablet Take 1 tablet (2.5 mg total) by mouth daily. 90 tablet 3   metoprolol succinate (TOPROL-XL) 25 MG 24 hr tablet TAKE 1 AND 1/2  TABLETS EVERY DAY 135 tablet 2   predniSONE (DELTASONE) 10 MG tablet Take by mouth.     rosuvastatin (CRESTOR) 10 MG tablet TAKE 1 TABLET EVERY DAY 90 tablet 3   BISACODYL 5 MG EC tablet See admin instructions. (Patient not taking: Reported on 08/30/2022)     FLUZONE HIGH-DOSE QUADRIVALENT 0.7 ML SUSY  (Patient not taking: Reported on 08/30/2022)     No current facility-administered medications for this visit.    Allergies:   Pravastatin and Penicillins   Social History:  The patient  reports that he quit smoking about 23 years ago. His smoking use included cigarettes. He has a 37.50 pack-year smoking history. He has been exposed to tobacco smoke. He has never used smokeless tobacco. He reports current alcohol use of about 1.0 standard drink of alcohol per week. He reports that he does not use drugs.   Family History:  The patient's family history includes Colon cancer in his father. He was adopted.   ROS:  Please see the history of present illness.   Otherwise, review of systems is positive for none.   All other systems are reviewed and negative.   PHYSICAL EXAM: VS:  BP 120/72   Pulse 66   Ht '5\' 11"'$  (1.803 m)   Wt 203 lb 6.4 oz (92.3 kg)   SpO2 95%   BMI 28.37 kg/m  , BMI Body mass index is 28.37 kg/m. GEN: Well nourished, well developed, in no acute distress  HEENT: normal  Neck: no JVD, carotid bruits, or masses Cardiac: RRR; no murmurs, rubs, or gallops,no edema  Respiratory:  clear to auscultation bilaterally, normal work of breathing GI: soft, nontender, nondistended, + BS MS: no deformity or atrophy  Skin: warm and dry Neuro:  Strength and sensation are intact Psych: euthymic mood, full affect  EKG:  EKG is ordered today. Personal review of the ekg ordered shows sinus rhythm   Recent Labs: 05/13/2022: ALT 13; BUN 19; Creat 1.15; Hemoglobin 13.6; Platelets 251; Potassium 4.9; Sodium 141; TSH 3.72    Lipid Panel     Component Value Date/Time   CHOL 124 05/13/2022  0808   CHOL 126 01/03/2020 0847   TRIG 97 05/13/2022 0808   HDL 31 (L) 05/13/2022 0808   HDL 32 (L) 01/03/2020 0847   CHOLHDL 4.0 05/13/2022 0808   VLDL 26 10/18/2016 0844   LDLCALC 75 05/13/2022 0808     Wt Readings from Last 3 Encounters:  08/30/22 203 lb 6.4 oz (92.3 kg)  05/18/22 211 lb (95.7 kg)  04/20/22 210 lb 9.6 oz (95.5 kg)      Other studies Reviewed: Additional studies/ records that were reviewed today include: TTE 2020  Review of the above records today demonstrates:   1. The left ventricle has normal systolic function with an ejection  fraction of 60-65%. The cavity size was normal. Left ventricular diastolic  parameters were normal.   2. The right ventricle has normal systolic function.  The cavity was  normal. There is no increase in right ventricular wall thickness.   3. Left atrial size was mildly dilated.   4. The mitral valve is normal in structure. There is mild mitral annular  calcification present.   5. The tricuspid valve is normal in structure.   6. The aortic valve is tricuspid Moderate thickening of the aortic valve  Moderate calcification of the aortic valve.   7. The pulmonic valve was normal in structure.    ASSESSMENT AND PLAN:  1.  Paroxysmal atrial fibrillation/typical atrial flutter: CHA2DS2-VASc of 3.  Status post ablation 08/05/2021.  Has had no further episodes of atrial fibrillation.  Continue with current management. Eliquis 5 mg twice daily Toprol-XL 25 mg daily  2.  Hypertension: Well-controlled  3.  Hyperlipidemia: Continue Crestor per primary cardiology  4.  Secondary hypercoagulable state: Currently on Eliquis for atrial fibrillation as above   Current medicines are reviewed at length with the patient today.   The patient does not have concerns regarding his medicines.  The following changes were made today: None  Labs/ tests ordered today include:  Orders Placed This Encounter  Procedures   EKG 12-Lead      Disposition:   FU 12 months  Signed, Aleeyah Bensen Meredith Leeds, MD  08/30/2022 2:40 PM     [address] [address] [phone] (office) [phone] (fax)

## 2022-08-30 NOTE — Patient Instructions (Signed)
Medication Instructions:  Your physician recommends that you continue on your current medications as directed. Please refer to the Current Medication list given to you today.  *If you need a refill on your cardiac medications before your next appointment, please call your pharmacy*   Lab Work: None ordered   Testing/Procedures: None ordered   Follow-Up: At [facility], you and your health needs are our priority.  As part of our continuing mission to provide you with exceptional heart care, we have created designated Provider Care Teams.  These Care Teams include your primary Cardiologist (physician) and Advanced Practice Providers (APPs -  Physician Assistants and Nurse Practitioners) who all work together to provide you with the care you need, when you need it.  Your next appointment:   1 year(s)  The format for your next appointment:   In Person  Provider:   Will Camnitz, MD    Thank you for choosing [facility]!!   Kaitlen Redford, RN [phone]  Other Instructions   Important Information About Sugar           

## 2022-09-03 DIAGNOSIS — K52832 Lymphocytic colitis: Secondary | ICD-10-CM | POA: Diagnosis not present

## 2022-09-24 ENCOUNTER — Ambulatory Visit: Payer: Medicare HMO | Admitting: Podiatry

## 2022-10-07 ENCOUNTER — Ambulatory Visit (HOSPITAL_COMMUNITY): Payer: Medicare HMO

## 2022-10-07 ENCOUNTER — Ambulatory Visit (HOSPITAL_COMMUNITY)
Admission: AD | Admit: 2022-10-07 | Discharge: 2022-10-07 | Disposition: A | Discharge status: Home / Self Care | Payer: Medicare HMO | Source: Ambulatory Visit | Attending: Urology | Admitting: Urology

## 2022-10-07 ENCOUNTER — Ambulatory Visit (HOSPITAL_COMMUNITY): Payer: Medicare HMO | Admitting: Anesthesiology

## 2022-10-07 ENCOUNTER — Other Ambulatory Visit: Payer: Self-pay | Admitting: Urology

## 2022-10-07 ENCOUNTER — Ambulatory Visit (HOSPITAL_BASED_OUTPATIENT_CLINIC_OR_DEPARTMENT_OTHER): Payer: Medicare HMO | Admitting: Anesthesiology

## 2022-10-07 ENCOUNTER — Encounter (HOSPITAL_COMMUNITY): Payer: Self-pay | Admitting: Urology

## 2022-10-07 ENCOUNTER — Encounter (HOSPITAL_COMMUNITY)
Admission: AD | Disposition: A | Discharge status: Home / Self Care | Payer: Self-pay | Source: Ambulatory Visit | Attending: Urology

## 2022-10-07 ENCOUNTER — Other Ambulatory Visit: Payer: Self-pay

## 2022-10-07 DIAGNOSIS — F419 Anxiety disorder, unspecified: Secondary | ICD-10-CM | POA: Insufficient documentation

## 2022-10-07 DIAGNOSIS — Z87891 Personal history of nicotine dependence: Secondary | ICD-10-CM | POA: Insufficient documentation

## 2022-10-07 DIAGNOSIS — N201 Calculus of ureter: Secondary | ICD-10-CM | POA: Diagnosis not present

## 2022-10-07 DIAGNOSIS — N132 Hydronephrosis with renal and ureteral calculous obstruction: Secondary | ICD-10-CM | POA: Insufficient documentation

## 2022-10-07 DIAGNOSIS — E785 Hyperlipidemia, unspecified: Secondary | ICD-10-CM | POA: Insufficient documentation

## 2022-10-07 DIAGNOSIS — E119 Type 2 diabetes mellitus without complications: Secondary | ICD-10-CM | POA: Diagnosis not present

## 2022-10-07 DIAGNOSIS — R339 Retention of urine, unspecified: Secondary | ICD-10-CM | POA: Diagnosis not present

## 2022-10-07 DIAGNOSIS — M1990 Unspecified osteoarthritis, unspecified site: Secondary | ICD-10-CM | POA: Diagnosis not present

## 2022-10-07 DIAGNOSIS — N1330 Unspecified hydronephrosis: Secondary | ICD-10-CM | POA: Diagnosis not present

## 2022-10-07 DIAGNOSIS — Z01810 Encounter for preprocedural cardiovascular examination: Secondary | ICD-10-CM

## 2022-10-07 DIAGNOSIS — N401 Enlarged prostate with lower urinary tract symptoms: Secondary | ICD-10-CM | POA: Insufficient documentation

## 2022-10-07 DIAGNOSIS — I10 Essential (primary) hypertension: Secondary | ICD-10-CM | POA: Insufficient documentation

## 2022-10-07 DIAGNOSIS — I480 Paroxysmal atrial fibrillation: Secondary | ICD-10-CM | POA: Diagnosis not present

## 2022-10-07 DIAGNOSIS — R1084 Generalized abdominal pain: Secondary | ICD-10-CM | POA: Diagnosis not present

## 2022-10-07 DIAGNOSIS — I4891 Unspecified atrial fibrillation: Secondary | ICD-10-CM | POA: Diagnosis not present

## 2022-10-07 DIAGNOSIS — I700 Atherosclerosis of aorta: Secondary | ICD-10-CM | POA: Diagnosis not present

## 2022-10-07 DIAGNOSIS — Z79899 Other long term (current) drug therapy: Secondary | ICD-10-CM | POA: Insufficient documentation

## 2022-10-07 DIAGNOSIS — E1140 Type 2 diabetes mellitus with diabetic neuropathy, unspecified: Secondary | ICD-10-CM | POA: Diagnosis not present

## 2022-10-07 DIAGNOSIS — N200 Calculus of kidney: Secondary | ICD-10-CM | POA: Diagnosis not present

## 2022-10-07 HISTORY — PX: CYSTOSCOPY/URETEROSCOPY/HOLMIUM LASER/STENT PLACEMENT: SHX6546

## 2022-10-07 LAB — CBC
HCT: 47.5 % (ref 39.0–52.0)
Hemoglobin: 15.8 g/dL (ref 13.0–17.0)
MCH: 30.5 pg (ref 26.0–34.0)
MCHC: 33.3 g/dL (ref 30.0–36.0)
MCV: 91.7 fL (ref 80.0–100.0)
Platelets: 220 10*3/uL (ref 150–400)
RBC: 5.18 MIL/uL (ref 4.22–5.81)
RDW: 13.7 % (ref 11.5–15.5)
WBC: 13 10*3/uL — ABNORMAL HIGH (ref 4.0–10.5)
nRBC: 0 % (ref 0.0–0.2)

## 2022-10-07 LAB — COMPREHENSIVE METABOLIC PANEL
ALT: 21 U/L (ref 0–44)
AST: 31 U/L (ref 15–41)
Albumin: 4.1 g/dL (ref 3.5–5.0)
Alkaline Phosphatase: 63 U/L (ref 38–126)
Anion gap: 10 (ref 5–15)
BUN: 31 mg/dL — ABNORMAL HIGH (ref 8–23)
CO2: 24 mmol/L (ref 22–32)
Calcium: 9 mg/dL (ref 8.9–10.3)
Chloride: 103 mmol/L (ref 98–111)
Creatinine, Ser: 1.71 mg/dL — ABNORMAL HIGH (ref 0.61–1.24)
GFR, Estimated: 41 mL/min — ABNORMAL LOW (ref >60)
Glucose, Bld: 134 mg/dL — ABNORMAL HIGH (ref 70–99)
Potassium: 4.2 mmol/L (ref 3.5–5.1)
Sodium: 137 mmol/L (ref 135–145)
Total Bilirubin: 0.8 mg/dL (ref 0.3–1.2)
Total Protein: 7.2 g/dL (ref 6.5–8.1)

## 2022-10-07 LAB — GLUCOSE, CAPILLARY
Glucose-Capillary: 135 mg/dL — ABNORMAL HIGH (ref 70–99)
Glucose-Capillary: 139 mg/dL — ABNORMAL HIGH (ref 70–99)

## 2022-10-07 SURGERY — CYSTOSCOPY/URETEROSCOPY/HOLMIUM LASER/STENT PLACEMENT
Anesthesia: General | Laterality: Left

## 2022-10-07 MED ORDER — ORAL CARE MOUTH RINSE: 15.0000 mL | Freq: Once | OROMUCOSAL | Status: AC

## 2022-10-07 MED ORDER — DOCUSATE SODIUM 100 MG PO CAPS: 100.0000 mg | ORAL_CAPSULE | Freq: Every day | ORAL | 0 refills | Status: DC | PRN

## 2022-10-07 MED ORDER — PHENYLEPHRINE 80 MCG/ML (10ML) SYRINGE FOR IV PUSH (FOR BLOOD PRESSURE SUPPORT)
PREFILLED_SYRINGE | INTRAVENOUS | Status: DC | PRN
  Administered 2022-10-07 (×3): 120 ug via INTRAVENOUS
  Administered 2022-10-07: 160 ug via INTRAVENOUS

## 2022-10-07 MED ORDER — ONDANSETRON HCL 4 MG/2ML IJ SOLN
INTRAMUSCULAR | Status: DC | PRN
  Administered 2022-10-07: 4 mg via INTRAVENOUS

## 2022-10-07 MED ORDER — LACTATED RINGERS IV SOLN: INTRAVENOUS | Status: DC

## 2022-10-07 MED ORDER — CHLORHEXIDINE GLUCONATE 0.12 % MT SOLN
15.0000 mL | Freq: Once | OROMUCOSAL | Status: AC
  Administered 2022-10-07: 15 mL via OROMUCOSAL

## 2022-10-07 MED ORDER — SULFAMETHOXAZOLE-TRIMETHOPRIM 800-160 MG PO TABS: 1.0000 | ORAL_TABLET | Freq: Two times a day (BID) | ORAL | 0 refills | Status: AC

## 2022-10-07 MED ORDER — LIDOCAINE 2% (20 MG/ML) 5 ML SYRINGE
INTRAMUSCULAR | Status: DC | PRN
  Administered 2022-10-07: 80 mg via INTRAVENOUS

## 2022-10-07 MED ORDER — PROPOFOL 10 MG/ML IV BOLUS
INTRAVENOUS | Status: AC
  Filled 2022-10-07: qty 20

## 2022-10-07 MED ORDER — PROPOFOL 10 MG/ML IV BOLUS
INTRAVENOUS | Status: DC | PRN
  Administered 2022-10-07: 160 mg via INTRAVENOUS
  Administered 2022-10-07: 20 mg via INTRAVENOUS

## 2022-10-07 MED ORDER — FENTANYL CITRATE PF 50 MCG/ML IJ SOSY: 25.0000 ug | PREFILLED_SYRINGE | INTRAMUSCULAR | Status: DC | PRN

## 2022-10-07 MED ORDER — ONDANSETRON HCL 4 MG/2ML IJ SOLN: 4.0000 mg | Freq: Once | INTRAMUSCULAR | Status: DC | PRN

## 2022-10-07 MED ORDER — DEXAMETHASONE SODIUM PHOSPHATE 10 MG/ML IJ SOLN
INTRAMUSCULAR | Status: AC
  Filled 2022-10-07: qty 1

## 2022-10-07 MED ORDER — EPHEDRINE SULFATE-NACL 50-0.9 MG/10ML-% IV SOSY
PREFILLED_SYRINGE | INTRAVENOUS | Status: DC | PRN
  Administered 2022-10-07 (×2): 10 mg via INTRAVENOUS
  Administered 2022-10-07: 5 mg via INTRAVENOUS

## 2022-10-07 MED ORDER — GENTAMICIN SULFATE 40 MG/ML IJ SOLN
5.0000 mg/kg | INTRAVENOUS | Status: AC
  Administered 2022-10-07: 407.6 mg via INTRAVENOUS
  Filled 2022-10-07: qty 10.25

## 2022-10-07 MED ORDER — IOHEXOL 300 MG/ML  SOLN
INTRAMUSCULAR | Status: DC | PRN
  Administered 2022-10-07: 3 mL via URETHRAL

## 2022-10-07 MED ORDER — ONDANSETRON HCL 4 MG/2ML IJ SOLN
INTRAMUSCULAR | Status: AC
  Filled 2022-10-07: qty 2

## 2022-10-07 MED ORDER — SODIUM CHLORIDE 0.9 % IR SOLN
Status: DC | PRN
  Administered 2022-10-07: 3000 mL via INTRAVESICAL

## 2022-10-07 MED ORDER — DEXAMETHASONE SODIUM PHOSPHATE 10 MG/ML IJ SOLN
INTRAMUSCULAR | Status: DC | PRN
  Administered 2022-10-07: 4 mg via INTRAVENOUS

## 2022-10-07 MED ORDER — OXYCODONE-ACETAMINOPHEN 5-325 MG PO TABS: 1.0000 | ORAL_TABLET | ORAL | 0 refills | Status: DC | PRN

## 2022-10-07 MED ORDER — ACETAMINOPHEN 10 MG/ML IV SOLN
INTRAVENOUS | Status: DC | PRN
  Administered 2022-10-07: 1000 mg via INTRAVENOUS

## 2022-10-07 SURGICAL SUPPLY — 25 items
APL SKNCLS STERI-STRIP NONHPOA (GAUZE/BANDAGES/DRESSINGS)
BAG URO CATCHER STRL LF (MISCELLANEOUS) ×1 IMPLANT
BASKET ZERO TIP NITINOL 2.4FR (BASKET) IMPLANT
BENZOIN TINCTURE PRP APPL 2/3 (GAUZE/BANDAGES/DRESSINGS) IMPLANT
BSKT STON RTRVL ZERO TP 2.4FR (BASKET) ×1
CATH URETERAL DUAL LUMEN 10F (MISCELLANEOUS) IMPLANT
CATH URETL OPEN 5X70 (CATHETERS) ×1 IMPLANT
CLOTH BEACON ORANGE TIMEOUT ST (SAFETY) ×1 IMPLANT
DRSG TEGADERM 2-3/8X2-3/4 SM (GAUZE/BANDAGES/DRESSINGS) IMPLANT
FIBER LASER MOSES 200 DFL (Laser) IMPLANT
GLOVE BIOGEL M 7.0 STRL (GLOVE) ×1 IMPLANT
GOWN STRL REUS W/ TWL XL LVL3 (GOWN DISPOSABLE) ×1 IMPLANT
GOWN STRL REUS W/TWL XL LVL3 (GOWN DISPOSABLE) ×2
GUIDEWIRE STR DUAL SENSOR (WIRE) ×2 IMPLANT
GUIDEWIRE ZIPWRE .038 STRAIGHT (WIRE) IMPLANT
KIT TURNOVER KIT A (KITS) IMPLANT
LASER FIB FLEXIVA PULSE ID 365 (Laser) IMPLANT
MANIFOLD NEPTUNE II (INSTRUMENTS) ×1 IMPLANT
PACK CYSTO (CUSTOM PROCEDURE TRAY) ×1 IMPLANT
SHEATH NAVIGATOR HD 12/14X46 (SHEATH) IMPLANT
STENT URET 6FRX26 CONTOUR (STENTS) IMPLANT
TRACTIP FLEXIVA PULS ID 200XHI (Laser) IMPLANT
TRACTIP FLEXIVA PULSE ID 200 (Laser)
TUBING CONNECTING 10 (TUBING) ×1 IMPLANT
TUBING UROLOGY SET (TUBING) ×1 IMPLANT

## 2022-10-07 NOTE — Op Note (Signed)
Operative Note  Preoperative diagnosis:  1.  Left ureteral stone  Postoperative diagnosis: 1.  Left ureteral stone  Procedure(s): 1.  Cystoscopy 2. Left ureteroscopy with laser lithotripsy and basket extraction of stones 3. Left retrograde pyelogram 4. Left ureteral stent placement 5. Fluoroscopy with intraoperative interpretation  Surgeon: Rexene Alberts, MD  Assistants:  None  Anesthesia:  General  Complications:  None  EBL:  Minimal  Specimens: 1. Stones for stone analysis (to be done at [facility])  Drains/Catheters: 1.  Left 6Fr x 26cm ureteral stent without with a tether string  Intraoperative findings:   Cystoscopy demonstrated no suspicious bladder lesions. Left ureteroscopy demonstrated 4 mm distal left ureteral stone successfully fragmented past extracted. Successful left ureteral stent placement.  Indication:  Jimmy Bradshaw is a 77 y.o. male with distal left ureteral stone with refractory pain.  He has been brought to the operating today for ureteroscopy and laser lithotripsy and thus extraction of stone.  Description of procedure: After informed consent was obtained from the patient, the patient was identified and taken to the operating room and placed in the supine position.  General anesthesia was administered as well as perioperative IV antibiotics.  At the beginning of the case, a time-out was performed to properly identify the patient, the surgery to be performed, and the surgical site.  Sequential compression devices were applied to the lower extremities at the beginning of the case for DVT prophylaxis.  The patient was then placed in the dorsal lithotomy supine position, prepped and draped in sterile fashion.  We then passed the 21-French rigid cystoscope through the urethra and into the bladder under vision without any difficulty, noting a normal urethra without strictures and a mildly obstructing prostate.  A systematic evaluation of the bladder  revealed no evidence of any suspicious bladder lesions.  Ureteral orifices were in normal position.    A 0.038 sensor wire was then passed up to the level of the renal pelvis and secured to the drape as a safety wire. The ureteral catheter and cystoscope were removed, leaving the safety wire in place.   A semi-rigid ureteroscope was passed alongside the wire up the distal ureter which appeared normal.  I encountered a 4 mm distal left ureteral stone.  Using a 200 m holmium laser fiber, the stone was fragmented to 3 small pieces and basket extracted without trauma.  I then surveyed the left mid ureter and proximal ureter noting no further stones.  I removed the scope.  Left retrograde pyelogram demonstrated mild left hydronephrosis.  There is no filling defects, no extravasation of contrast.  A 6 French by 26 cm left ureteral stent was then passed over the wire to the kidney with a curl in the renal pelvis and bladder respectively.  A tether was left in due to his penis using a Tegaderm adhesive.  Bladder was drained and all stone fragments were removed.  Plan:  Patient will be discharged home.  Follow-up in 1 month with renal ultrasound.   [facility]  Pager: [phone]

## 2022-10-07 NOTE — Discharge Instructions (Signed)
[facility] [phone] Post Ureteroscopy With or Without Stent Instructions  Definitions:  Ureter: The duct that transports urine from the kidney to the bladder. Stent:   A plastic hollow tube that is placed into the ureter, from the kidney to the bladder to prevent the ureter from swelling shut.  GENERAL INSTRUCTIONS:  Despite the fact that no skin incisions were used, the area around the ureter and bladder is raw and irritated. The stent is a foreign body which will further irritate the bladder wall. This irritation is manifested by increased frequency of urination, both day and night, and by an increase in the urge to urinate. In some, the urge to urinate is present almost always. Sometimes the urge is strong enough that you may not be able to stop yourself from urinating. The only real cure is to remove the stent and then give time for the bladder wall to heal which can't be done until the danger of the ureter swelling shut has passed, which varies.  You may see some blood in your urine while the stent is in place and a few days afterwards. Do not be alarmed, even if the urine was clear for a while. Get off your feet and drink lots of fluids until clearing occurs. If you start to pass clots or don't improve, call us.  DIET: You may return to your normal diet immediately. Because of the raw surface of your bladder, alcohol, spicy foods, acid type foods and drinks with caffeine may cause irritation or frequency and should be used in moderation. To keep your urine flowing freely and to avoid constipation, drink plenty of fluids during the day ( 8-10 glasses ). Tip: Avoid cranberry juice because it is very acidic.  ACTIVITY: Your physical activity doesn't need to be restricted. However, if you are very active, you may see some blood in your urine. We suggest that you reduce your activity under these circumstances until the bleeding has stopped.  BOWELS: It is important to  keep your bowels regular during the postoperative period. Straining with bowel movements can cause bleeding. A bowel movement every other day is reasonable. Use a mild laxative if needed, such as Milk of Magnesia 2-3 tablespoons, or 2 Dulcolax tablets. Call if you continue to have problems. If you have been taking narcotics for pain, before, during or after your surgery, you may be constipated. Take a laxative if necessary.   MEDICATION: You should resume your pre-surgery medications unless told not to. In addition you will often be given an antibiotic to prevent infection. These should be taken as prescribed until the bottles are finished unless you are having an unusual reaction to one of the drugs.  PROBLEMS YOU SHOULD REPORT TO [place]: Fevers over 100.5 Fahrenheit. Heavy bleeding, or clots ( See above notes about blood in urine ). Inability to urinate. Drug reactions ( hives, rash, nausea, vomiting, diarrhea ). Severe burning or pain with urination that is not improving.  FOLLOW-UP: You will need a follow-up appointment to monitor your progress. Call for this appointment at the number listed above. Usually the first appointment will be about three to fourteen days after your surgery.  You have a ureteral stent drain your left kidney.  You may remove stent on Monday morning by pulling attached string.

## 2022-10-07 NOTE — H&P (Signed)
Office Visit Report     10/07/2022   --------------------------------------------------------------------------------   Jimmy Bradshaw  MRN: [id number]  DOB: 1946/03/11, 77 year old Male  SSN: [id number]   PRIMARY CARE:  Modena Nunnery. [facility], [place]:  [phone]  REFERRING:  Lillette Boxer. Dahlstedt, MD  PROVIDER:  Franchot Gallo, M.D.  TREATING:  Rexene Alberts, M.D.  LOCATION:  [facility] [phone]     --------------------------------------------------------------------------------   CC/HPI: Jimmy Bradshaw is a 77 year old male who is a patient of Dr. Mikey Bussing and is seen on call today with complaints of left flank pain.   #1. Urolithiasis:  CT A/P 04/2022 with punctate bilateral nonobstructing stones measuring only 1-2 mm. Patient awoke this morning at 3 AM with severe left-sided flank pain associated with nausea and emesis. He states pain has been present since this morning and has not been alleviated. He denies fevers or chills. He denies dysuria. He drank a few sips of water this morning however not has not anything to eat since last night.  -CT A/P 10/07/2022 (my read): 4 mm distal left ureteral stone with mild left hydronephrosis.   He has a past urologic history of BPH with 2 prior TURP. He also has history of hematuria and CT hematuria protocol in 04/2022 unremarkable other than punctate stones as above.     ALLERGIES: Penicillins    MEDICATIONS: Allopurinol 100 mg tablet  Allopurinol 300 mg tablet 1 tablet PO Daily  Metoprolol Succinate 25 mg tablet, extended release 24 hr 1 tablet PO Daily  Bupropion Xl 150 mg tablet, extended release 24 hr 1 tablet PO Daily  Lisinopril 2.5 mg tablet 1 tablet PO Daily  Rosuvastatin Calcium 10 mg tablet 1 tablet PO Daily     GU PSH: Cystoscopy - 04/16/2020 Cystoscopy TURP - 2016 Locm 300-'399Mg'$ /Ml Iodine,1Ml - 05/06/2022 Remove Prostate Regrowth - 07/03/2020       PSH Notes: Prostate Surgery, Knee Surgery Left,  Transurethral Resection Of Prostate (TURP), Lung Surgery, Closed Treatment Of A Single Rib Fracture   NON-GU PSH: Cataract surgery - 2020 Knee Arthroscopy/surgery - 2016 Lung Surgery (Unspecified) - 2012 Rotator cuff surgery - 2016     GU PMH: BPH w/LUTS, Stable. He has had 2 TURPs. - 05/28/2022, - 06/18/2020 (Worsening), Symptoms are getting a bit worse. Benign exam., - 2019, Benign prostatic hyperplasia with urinary obstruction, - 2017 Gross hematuria, Initial in nature, most likely from prostatic urethral source. I do not think he needs cystoscopy - 05/28/2022, - 05/06/2022, - 04/30/2022, - 08/06/2020, - 07/23/2020 Pelvic/perineal pain, Resolving with treatment for hamstring pull - 05/28/2022, - 04/30/2022, Pain is more likely related to abductor muscles, - 04/09/2022 BPH w/o LUTS, Benign prostate exam, urine fine. - 04/09/2022, he is doing well after his TURP about 3 months ago. Excellent urinary score. Pathology of specimen benign., - 2020/12/03, - 08/06/2020, - 07/23/2020, - 06/18/2020 (Stable), His sx's since last visit have been quite stable. He is not currently on any medication for his urination. , - 2020 Nocturia - December 03, 2020, (Worsening), - 2019, Nocturia, - 2015 Ureteral calculus, He has passed stone. 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, unspecified, Organic erectile dysfunction - 2015 Urinary Retention, Unspec, Incomplete bladder emptying - 2015 Urinary Frequency, Urinary frequency - 2014 Urinary Urgency, Urinary urgency - 2014    NON-GU PMH: Encounter for general adult medical examination without abnormal findings, Encounter for preventive health examination - 2016 Gout,  Gout - 2014 Personal history of other endocrine, nutritional and metabolic disease, History of hypercholesterolemia - 2014 Diabetes Type 2    FAMILY HISTORY: Colon Cancer - Mother Father Deceased At Age32 ___ - Runs In Family Mother Deceased At Age 61  from diabetic complicati - Runs In Family   SOCIAL HISTORY: Marital Status: Married Preferred Language: English; Ethnicity: Not Hispanic Or Latino; Race: White Does not use smokeless tobacco. Drinks 1 drink per day.  Drinks 2 caffeinated drinks per day. Patient's occupation is/was retired.     Notes: Former smoker, Occupation:, Marital History - Currently Married, Alcohol Use, Caffeine Use   REVIEW OF SYSTEMS:    GU Review Male:   Patient denies frequent urination, hard to postpone urination, burning/ pain with urination, get up at night to urinate, leakage of urine, stream starts and stops, trouble starting your stream, have to strain to urinate , erection problems, and penile pain.  Gastrointestinal (Upper):   Patient denies nausea, vomiting, and indigestion/ heartburn.  Gastrointestinal (Lower):   Patient denies constipation and diarrhea.  Constitutional:   Patient denies fever, night sweats, weight loss, and fatigue.  Skin:   Patient denies skin rash/ lesion and itching.  Eyes:   Patient denies blurred vision and double vision.  Ears/ Nose/ Throat:   Patient denies sore throat and sinus problems.  Hematologic/Lymphatic:   Patient denies swollen glands and easy bruising.  Cardiovascular:   Patient denies leg swelling and chest pains.  Respiratory:   Patient denies cough and shortness of breath.  Endocrine:   Patient denies excessive thirst.  Musculoskeletal:   Patient denies back pain and joint pain.  Neurological:   Patient denies headaches and dizziness.  Psychologic:   Patient denies depression and anxiety.   VITAL SIGNS:      10/07/2022 10:33 AM  Weight 207 lb / 93.89 kg  Height 71.5 in / 181.61 cm  BP 205/96 mmHg  Pulse 81 /min  Temperature 97.3 F / 36.2 C  BMI 28.5 kg/m   MULTI-SYSTEM PHYSICAL EXAMINATION:    Constitutional: Well-nourished. No physical deformities. Normally developed. Good grooming.  Respiratory: No labored breathing, no use of accessory muscles.    Cardiovascular: Normal temperature, normal extremity pulses, no swelling, no varicosities.  Gastrointestinal: No mass, no tenderness, no rigidity, non obese abdomen.     Complexity of Data:  Source Of History:  Patient, Medical Record Summary  Records Review:   Previous Doctor Records  Urine Test Review:   Urinalysis  X-Ray Review: C.T. Abdomen/Pelvis: Reviewed Films. Reviewed Report. Discussed With Patient.     04/09/20 05/03/18 09/25/15 12/29/13 10/12/12  PSA  Total PSA 1.78 ng/mL 1.83 ng/mL 2.41  1.92  2.08    Notes:                     CLINICAL DATA: Gross hematuria   EXAM:  CT ABDOMEN AND PELVIS WITHOUT AND WITH CONTRAST   TECHNIQUE:  Multidetector CT imaging of the abdomen and pelvis was performed  following the standard protocol before and following the bolus  administration of intravenous contrast.   RADIATION DOSE REDUCTION: This exam was performed according to the  departmental dose-optimization program which includes automated  exposure control, adjustment of the mA and/or kV according to  patient size and/or use of iterative reconstruction technique.   CONTRAST: 125 mL Omnipaque 300 iodinated contrast IV   COMPARISON: 09/30/2020   FINDINGS:  Lower chest: No acute abnormality. Coronary artery calcifications.   Hepatobiliary: No solid  liver abnormality is seen. No gallstones,  gallbladder wall thickening, or biliary dilatation.   Pancreas: Unremarkable. No pancreatic ductal dilatation or  surrounding inflammatory changes.   Spleen: Normal in size without significant abnormality.   Adrenals/Urinary Tract: Adrenal glands are unremarkable. Punctuate  bilateral nonobstructive renal calculi (series 601, image 76, 89).  No ureteral calculi or hydronephrosis. Simple, benign bilateral  renal cortical cysts, for which no further follow-up or  characterization is required. No urinary tract filling defect on  delayed phase imaging. Bladder is unremarkable.    Stomach/Bowel: Stomach is within normal limits. Appendix appears  normal. No evidence of bowel wall thickening, distention, or  inflammatory changes. Descending and sigmoid diverticulosis.   Vascular/Lymphatic: Aortic atherosclerosis. Aneurysm of the  infrarenal abdominal aorta measuring up to 3.1 x 3.0 cm. No enlarged  abdominal or pelvic lymph nodes.   Reproductive: TURP defect of the prostate.   Other: No abdominal wall hernia or abnormality. No ascites.   Musculoskeletal: No acute or significant osseous findings.   IMPRESSION:  1. Punctuate bilateral nonobstructive renal calculi. No ureteral  calculi or hydronephrosis.  2. No suspicious urinary tract mass or contrast enhancement. No  urinary tract filling defect on delayed phase imaging.  3. TURP defect of the prostate.  4. Diverticulosis without evidence of acute diverticulitis.  5. Aneurysm of the infrarenal abdominal aorta measuring up to 3.1 x  3.0 cm. Recommend follow-up ultrasound every 5 years if not  otherwise imaged. This recommendation follows ACR consensus  guidelines: White Paper of the ACR Incidental Findings Committee II  on Vascular Findings. J Am Coll Radiol 2013; 10:789-794.   Aortic Atherosclerosis (ICD10-I70.0).    Electronically Signed  By: Delanna Ahmadi M.D.  On: 05/08/2022 13:08   PROCEDURES:         C.T. Urogram - [id number]      Patient confirmed No Neulasta OnPro Device.         Urinalysis w/Scope Dipstick Dipstick Cont'd Micro  Color: Yellow Bilirubin: Neg mg/dL WBC/hpf: NS (Not Seen)  Appearance: Clear Ketones: Neg mg/dL RBC/hpf: 0 - 2/hpf  Specific Gravity: 1.030 Blood: 3+ ery/uL Bacteria: NS (Not Seen)  pH: <=5.0 Protein: 2+ mg/dL Cystals: NS (Not Seen)  Glucose: Neg mg/dL Urobilinogen: 0.2 mg/dL Casts: NS (Not Seen)    Nitrites: Neg Trichomonas: Not Present    Leukocyte Esterase: Neg leu/uL Mucous: Not Present      Epithelial Cells: 0 - 5/hpf      Yeast: NS (Not Seen)      Sperm:  Not Present    Notes: Micro performed on unspun urine due to QNS          Ketoralac '60mg'$  - [id number], [id number] Pt was prepped and cleaned. Ketoralac '60mg'$  was injected IM in the right buttock. Zero wasted. Pt tolerated procedure well.    Qty: 60 Adm. By: Rene Paci  Unit: mg Lot No [id number]  Route: IM Exp. Date 11/19/2023  Freq: None Mfgr.:   Site: Right Buttock   ASSESSMENT:      ICD-10 Details  1 GU:   Flank Pain - R10.84   2   Renal calculus - N20.0    PLAN:           Orders Labs Urine Culture  X-Rays: C.T. Stone Protocol Without I.V. Contrast          Schedule         Document Letter(s):  Created for Patient: Clinical Summary         Notes:  1. Distal left ureteral stone:  -CT obtained today with 4 mm distal left ureteral stone. He is symptomatic with abdominal pain, persistent nausea and emesis. We discussed options including left ureter stent placement and possible left ureteroscopy with laser lithotripsy. Will add on today for this procedure. Discussed risk and benefits.   We discussed the options for management of kidney stones, including observation, ESWL, ureteroscopy with laser lithotripsy, and PCNL. The risks and benefits of each option were discussed.  For observation I described the risks which include but are not limited to silent renal damage, life-threatening infection, need for emergent surgery, failure to pass stone, and pain.   ESWL: risks and benefits of ESWL were outlined including infection, bleeding, pain, steinstrasse, kidney injury, need for ancillary treatments, and global anesthesia risks including but not limited to CVA, MI, DVT, PE, pneumonia, and death.   Ureteroscopy: risks and benefits of ureteroscopy were outlined, including infection, bleeding, pain, temporary ureteral stent and associated stent bother, ureteral injury, ureteral stricture, need for ancillary treatments, and global anesthesia risks including but not limited to CVA, MI,  DVT, PE, pneumonia, and death.   CC: Franchot Gallo, MD        Next Appointment:      Next Appointment: 11/09/2022 07:45 AM    Appointment Type: Renal Ultrasound    Location: [facility] [phone]    Provider: Radiology Rm1 Radiology Rm 1    Reason for Visit: PO   Urology Preoperative H&P   Chief Complaint: left ureteral stone  History of Present Illness: Jimmy Bradshaw is a 77 y.o. male with a left ureteral stone here for left stent and definitive stone management.    Past Medical History:  Diagnosis Date   Anxiety    Arthritis    HANDS,  SHOULDERS   Bladder neck obstruction    BPH (benign prostatic hypertrophy)    Frequency of urination    H/O: gout    History of colon polyps    Hyperlipidemia    Nocturia    PAF (paroxysmal atrial fibrillation) ([place])    Type 2 diabetes mellitus ([place])    diet controlled   Urgency of urination    Wears hearing aid    BILATERAL    Past Surgical History:  Procedure Laterality Date   ATRIAL FIBRILLATION ABLATION N/A 08/05/2021   Procedure: ATRIAL FIBRILLATION ABLATION;  Surgeon: Constance Haw, MD;  Location: [place] [facility];  Service: Cardiovascular;  Laterality: N/A;   CARDIOVERSION N/A 10/26/2016   Procedure: CARDIOVERSION;  Surgeon: Larey Dresser, MD;  Location: [place];  Service: Cardiovascular;  Laterality: N/A;   CARDIOVERSION N/A 04/17/2019   Procedure: CARDIOVERSION;  Surgeon: Sueanne Margarita, MD;  Location: [facility] ENDOSCOPY;  Service: Cardiovascular;  Laterality: N/A;   COLONOSCOPY  08-30-2011, 2017   I & D KNEE WITH POLY EXCHANGE Left 10/08/2014   Procedure: IRRIGATION AND DEBRIDEMENT LEFT UNI KNEE ARTHOPLASTY  WITH POLY EXCHANGE;  Surgeon: Mauri Pole, MD;  Location: [facility] ORS;  Service: Orthopedics;  Laterality: Left;   KNEE ARTHROSCOPY Left 11/ 2013   PARTIAL KNEE ARTHROPLASTY Left 02/05/2013   Procedure: LEFT KNEE MEDIAL UNICOMPARTMENTAL KNEE;  Surgeon: Mauri Pole, MD;  Location: [facility]  ORS;  Service: Orthopedics;  Laterality: Left;   PICC LINE INSERTION     and removed later   TRANSURETHRAL RESECTION OF PROSTATE N/A 09/30/2014   Procedure: TRANSURETHRAL RESECTION OF THE PROSTATE WITH GYRUS INSTRUMENTS;  Surgeon: Bernestine Amass, MD;  Location: [place];  Service: Urology;  Laterality: N/A;   TRANSURETHRAL RESECTION OF PROSTATE N/A 07/03/2020   Procedure: TRANSURETHRAL RESECTION OF THE PROSTATE (TURP);  Surgeon: Franchot Gallo, MD;  Location: [facility];  Service: Urology;  Laterality: N/A;  1 HR    Allergies:  Allergies  Allergen Reactions   Pravastatin Other (See Comments)    Headache and legs hurt   Penicillins Rash    Reaction as a child. Tolerated a penicillin shot w/o reaction 2018    Family History  Adopted: Yes  Problem Relation Age of Onset   Colon cancer Father     Social History:  reports that he quit smoking about 23 years ago. His smoking use included cigarettes. He has a 37.50 pack-year smoking history. He has been exposed to tobacco smoke. He has never used smokeless tobacco. He reports current alcohol use of about 1.0 standard drink of alcohol per week. He reports that he does not use drugs.  ROS: A complete review of systems was performed.  All systems are negative except for pertinent findings as noted.  Physical Exam:  Vital signs in last 24 hours: Weight:  [90.7 kg] 90.7 kg (01/18 1639) Constitutional:  Alert and oriented, No acute distress Cardiovascular: Regular rate and rhythm Respiratory: Normal respiratory effort, Lungs clear bilaterally GI: Abdomen is soft, nontender, nondistended, no abdominal masses GU: No CVA tenderness Lymphatic: No lymphadenopathy Neurologic: Grossly intact, no focal deficits Psychiatric: Normal mood and affect  Laboratory Data:  Recent Labs    10/07/22 1630  WBC 13.0*  HGB 15.8  HCT 47.5  PLT 220    No results for input(s): "NA", "K", "CL", "GLUCOSE", "BUN",  "CALCIUM", "CREATININE" in the last 72 hours.  Invalid input(s): "CO3"   Results for orders placed or performed during the hospital encounter of 10/07/22 (from the past 24 hour(s))  CBC     Status: Abnormal   Collection Time: 10/07/22  4:30 PM  Result Value Ref Range   WBC 13.0 (H) 4.0 - 10.5 K/uL   RBC 5.18 4.22 - 5.81 MIL/uL   Hemoglobin 15.8 13.0 - 17.0 g/dL   HCT 47.5 39.0 - 52.0 %   MCV 91.7 80.0 - 100.0 fL   MCH 30.5 26.0 - 34.0 pg   MCHC 33.3 30.0 - 36.0 g/dL   RDW 13.7 11.5 - 15.5 %   Platelets 220 150 - 400 K/uL   nRBC 0.0 0.0 - 0.2 %  Glucose, capillary     Status: Abnormal   Collection Time: 10/07/22  4:40 PM  Result Value Ref Range   Glucose-Capillary 135 (H) 70 - 99 mg/dL   No results found for this or any previous visit (from the past 240 hour(s)).  Renal Function: No results for input(s): "CREATININE" in the last 168 hours. CrCl cannot be calculated (Patient's most recent lab result is older than the maximum 21 days allowed.).  Radiologic Imaging: No results found.  I independently reviewed the above imaging studies.  Assessment and Plan Jimmy Bradshaw is a 77 y.o. male with a left ureteral stone with refractory pain here for cysto, L URS/LL, L RPG, L stent.  -The risks, benefits and alternatives of cystoscopy with L URS/LL, L RPG, L stent placement was discussed with the patient.  Risks include, but are not limited to: bleeding, urinary tract infection, ureteral injury, ureteral stricture disease, chronic pain, urinary symptoms, bladder injury, stent migration, the need for nephrostomy tube placement, MI, CVA, DVT, PE and  the inherent risks with general anesthesia.  The patient voices understanding and wishes to proceed.       Jimmy R. Nakeisha Greenhouse MD 10/07/2022, 5:26 PM  [facility] Pager: [phone]): [phone]

## 2022-10-07 NOTE — Anesthesia Procedure Notes (Signed)
Procedure Name: LMA Insertion Date/Time: 10/07/2022 4:55 PM  Performed by: Milford Cage, CRNAPre-anesthesia Checklist: Patient identified, Emergency Drugs available, Suction available and Patient being monitored Patient Re-evaluated:Patient Re-evaluated prior to induction Oxygen Delivery Method: Circle system utilized Preoxygenation: Pre-oxygenation with 100% oxygen Induction Type: IV induction Ventilation: Mask ventilation without difficulty LMA: LMA with gastric port inserted LMA Size: 4.0 Number of attempts: 1 Tube secured with: Tape Dental Injury: Teeth and Oropharynx as per pre-operative assessment

## 2022-10-07 NOTE — Anesthesia Preprocedure Evaluation (Addendum)
Anesthesia Evaluation  Patient identified by MRN, date of birth, ID band Patient awake    Reviewed: Allergy & Precautions, NPO status , Patient's Chart, lab work & pertinent test results, reviewed documented beta blocker date and time   Airway Mallampati: III  TM Distance: >3 FB Neck ROM: Full    Dental  (+) Teeth Intact, Dental Advisory Given,    Pulmonary former smoker   Pulmonary exam normal breath sounds clear to auscultation       Cardiovascular hypertension, Pt. on home beta blockers and Pt. on medications Normal cardiovascular exam+ dysrhythmias Atrial Fibrillation  Rhythm:Regular Rate:Normal     Neuro/Psych  PSYCHIATRIC DISORDERS Anxiety     negative neurological ROS     GI/Hepatic negative GI ROS, Neg liver ROS,,,  Endo/Other  diabetes, Well Controlled, Type 2    Renal/GU Renal disease (LEFT URETERAL STONE)   BPH     Musculoskeletal  (+) Arthritis ,    Abdominal   Peds  Hematology negative hematology ROS (+)   Anesthesia Other Findings Day of surgery medications reviewed with the patient.  Reproductive/Obstetrics                             Anesthesia Physical Anesthesia Plan  ASA: 3  Anesthesia Plan: General   Post-op Pain Management: Ofirmev IV (intra-op)*   Induction: Intravenous  PONV Risk Score and Plan: 3 and Dexamethasone, Ondansetron and Treatment may vary due to age or medical condition  Airway Management Planned: LMA  Additional Equipment:   Intra-op Plan:   Post-operative Plan: Extubation in OR  Informed Consent: I have reviewed the patients History and Physical, chart, labs and discussed the procedure including the risks, benefits and alternatives for the proposed anesthesia with the patient or authorized representative who has indicated his/her understanding and acceptance.     Dental advisory given  Plan Discussed with: CRNA  Anesthesia Plan  Comments:        Anesthesia Quick Evaluation

## 2022-10-07 NOTE — Transfer of Care (Signed)
Immediate Anesthesia Transfer of Care Note  Patient: Jimmy Bradshaw  Procedure(s) Performed: CYSTOSCOPY/LEFT RETROGRADE PYLOGRAM/ URETEROSCOPY/HOLMIUM LASER/STENT PLACEMENT (Left)  Patient Location: PACU  Anesthesia Type:General  Level of Consciousness: drowsy  Airway & Oxygen Therapy: Patient Spontanous Breathing and Patient connected to face mask oxygen  Post-op Assessment: Report given to RN and Post -op Vital signs reviewed and stable  Post vital signs: Reviewed and stable  Last Vitals:  Vitals Value Taken Time  BP 111/70 10/07/22 1935  Temp    Pulse 91 10/07/22 1936  Resp 21 10/07/22 1936  SpO2 100 % 10/07/22 1936  Vitals shown include unvalidated device data.  Last Pain:  Vitals:   10/07/22 1639  PainSc: 3          Complications: No notable events documented.

## 2022-10-07 NOTE — Anesthesia Postprocedure Evaluation (Signed)
Anesthesia Post Note  Patient: Jimmy Bradshaw  Procedure(s) Performed: CYSTOSCOPY/LEFT RETROGRADE PYLOGRAM/ URETEROSCOPY/HOLMIUM LASER/STENT PLACEMENT (Left)     Patient location during evaluation: PACU Anesthesia Type: General Level of consciousness: awake and alert Pain management: pain level controlled Vital Signs Assessment: post-procedure vital signs reviewed and stable Respiratory status: spontaneous breathing, nonlabored ventilation, respiratory function stable and patient connected to nasal cannula oxygen Cardiovascular status: blood pressure returned to baseline and stable Postop Assessment: no apparent nausea or vomiting Anesthetic complications: no   No notable events documented.  Last Vitals:  Vitals:   10/07/22 2000 10/07/22 2035  BP: 120/68 116/86  Pulse: 74 74  Resp: 13   Temp:  (!) 36.4 C  SpO2: 99% 100%    Last Pain:  Vitals:   10/07/22 2035  PainSc: 0-No pain                 Santa Lighter

## 2022-10-08 ENCOUNTER — Encounter (HOSPITAL_COMMUNITY): Payer: Self-pay | Admitting: Urology

## 2022-10-11 ENCOUNTER — Other Ambulatory Visit: Payer: Self-pay | Admitting: Family Medicine

## 2022-10-21 ENCOUNTER — Ambulatory Visit: Payer: Medicare HMO

## 2022-10-21 VITALS — BP 110/62 | Ht 71.0 in | Wt 207.0 lb

## 2022-10-21 DIAGNOSIS — Z0000 Encounter for general adult medical examination without abnormal findings: Secondary | ICD-10-CM

## 2022-10-21 NOTE — Patient Instructions (Addendum)
Jimmy Bradshaw , Thank you for taking time to come for your Medicare Wellness Visit. I appreciate your ongoing commitment to your health goals. Please review the following plan we discussed and let me know if I can assist you in the future.   These are the goals we discussed:  Goals      Exercise 3x per week (30 min per time)     Training dogs, running.     Pharmacy Care Plan:        This is a list of the screening recommended for you and due dates:  Health Maintenance  Topic Date Due   DTaP/Tdap/Td vaccine (2 - Td or Tdap) 09/25/2016   Yearly kidney health urinalysis for diabetes  08/12/2019   Eye exam for diabetics  10/18/2019   Zoster (Shingles) Vaccine (2 of 2) 04/30/2021   Flu Shot  04/20/2022   COVID-19 Vaccine (5 - 2023-24 season) 05/21/2022   Hemoglobin A1C  11/13/2022   Complete foot exam   05/19/2023   Medicare Annual Wellness Visit  05/19/2023   Yearly kidney function blood test for diabetes  10/08/2023   Colon Cancer Screening  08/04/2026   Pneumonia Vaccine  Completed   Hepatitis C Screening: USPSTF Recommendation to screen - Ages 18-79 yo.  Completed   HPV Vaccine  Aged Out    Advanced directives: Please bring a copy of your health care power of attorney and living will to the office to be added to your chart at your convenience.   Conditions/risks identified: Aim for 30 minutes of exercise or brisk walking, 6-8 glasses of water, and 5 servings of fruits and vegetables each day.   Next appointment: Follow up in one year for your annual wellness visit.   Preventive Care 48 Years and Older, Male  Preventive care refers to lifestyle choices and visits with your health care provider that can promote health and wellness. What does preventive care include? A yearly physical exam. This is also called an annual well check. Dental exams once or twice a year. Routine eye exams. Ask your health care provider how often you should have your eyes checked. Personal lifestyle  choices, including: Daily care of your teeth and gums. Regular physical activity. Eating a healthy diet. Avoiding tobacco and drug use. Limiting alcohol use. Practicing safe sex. Taking low doses of aspirin every day. Taking vitamin and mineral supplements as recommended by your health care provider. What happens during an annual well check? The services and screenings done by your health care provider during your annual well check will depend on your age, overall health, lifestyle risk factors, and family history of disease. Counseling  Your health care provider may ask you questions about your: Alcohol use. Tobacco use. Drug use. Emotional well-being. Home and relationship well-being. Sexual activity. Eating habits. History of falls. Memory and ability to understand (cognition). Work and work Statistician. Screening  You may have the following tests or measurements: Height, weight, and BMI. Blood pressure. Lipid and cholesterol levels. These may be checked every 5 years, or more frequently if you are over 57 years old. Skin check. Lung cancer screening. You may have this screening every year starting at age 4 if you have a 30-pack-year history of smoking and currently smoke or have quit within the past 15 years. Fecal occult blood test (FOBT) of the stool. You may have this test every year starting at age 26. Flexible sigmoidoscopy or colonoscopy. You may have a sigmoidoscopy every 5 years or a colonoscopy  every 10 years starting at age 108. Prostate cancer screening. Recommendations will vary depending on your family history and other risks. Hepatitis C blood test. Hepatitis B blood test. Sexually transmitted disease (STD) testing. Diabetes screening. This is done by checking your blood sugar (glucose) after you have not eaten for a while (fasting). You may have this done every 1-3 years. Abdominal aortic aneurysm (AAA) screening. You may need this if you are a current or  former smoker. Osteoporosis. You may be screened starting at age 47 if you are at high risk. Talk with your health care provider about your test results, treatment options, and if necessary, the need for more tests. Vaccines  Your health care provider may recommend certain vaccines, such as: Influenza vaccine. This is recommended every year. Tetanus, diphtheria, and acellular pertussis (Tdap, Td) vaccine. You may need a Td booster every 10 years. Zoster vaccine. You may need this after age 21. Pneumococcal 13-valent conjugate (PCV13) vaccine. One dose is recommended after age 34. Pneumococcal polysaccharide (PPSV23) vaccine. One dose is recommended after age 11. Talk to your health care provider about which screenings and vaccines you need and how often you need them. This information is not intended to replace advice given to you by your health care provider. Make sure you discuss any questions you have with your health care provider. Document Released: 10/03/2015 Document Revised: 05/26/2016 Document Reviewed: 07/08/2015 Elsevier Interactive Patient Education  2017 [employer] Prevention in the Home Falls can cause injuries. They can happen to people of all ages. There are many things you can do to make your home safe and to help prevent falls. What can I do on the outside of my home? Regularly fix the edges of walkways and driveways and fix any cracks. Remove anything that might make you trip as you walk through a door, such as a raised step or threshold. Trim any bushes or trees on the path to your home. Use bright outdoor lighting. Clear any walking paths of anything that might make someone trip, such as rocks or tools. Regularly check to see if handrails are loose or broken. Make sure that both sides of any steps have handrails. Any raised decks and porches should have guardrails on the edges. Have any leaves, snow, or ice cleared regularly. Use sand or salt on walking paths  during winter. Clean up any spills in your garage right away. This includes oil or grease spills. What can I do in the bathroom? Use night lights. Install grab bars by the toilet and in the tub and shower. Do not use towel bars as grab bars. Use non-skid mats or decals in the tub or shower. If you need to sit down in the shower, use a plastic, non-slip stool. Keep the floor dry. Clean up any water that spills on the floor as soon as it happens. Remove soap buildup in the tub or shower regularly. Attach bath mats securely with double-sided non-slip rug tape. Do not have throw rugs and other things on the floor that can make you trip. What can I do in the bedroom? Use night lights. Make sure that you have a light by your bed that is easy to reach. Do not use any sheets or blankets that are too big for your bed. They should not hang down onto the floor. Have a firm chair that has side arms. You can use this for support while you get dressed. Do not have throw rugs and other things on  the floor that can make you trip. What can I do in the kitchen? Clean up any spills right away. Avoid walking on wet floors. Keep items that you use a lot in easy-to-reach places. If you need to reach something above you, use a strong step stool that has a grab bar. Keep electrical cords out of the way. Do not use floor polish or wax that makes floors slippery. If you must use wax, use non-skid floor wax. Do not have throw rugs and other things on the floor that can make you trip. What can I do with my stairs? Do not leave any items on the stairs. Make sure that there are handrails on both sides of the stairs and use them. Fix handrails that are broken or loose. Make sure that handrails are as long as the stairways. Check any carpeting to make sure that it is firmly attached to the stairs. Fix any carpet that is loose or worn. Avoid having throw rugs at the top or bottom of the stairs. If you do have throw  rugs, attach them to the floor with carpet tape. Make sure that you have a light switch at the top of the stairs and the bottom of the stairs. If you do not have them, ask someone to add them for you. What else can I do to help prevent falls? Wear shoes that: Do not have high heels. Have rubber bottoms. Are comfortable and fit you well. Are closed at the toe. Do not wear sandals. If you use a stepladder: Make sure that it is fully opened. Do not climb a closed stepladder. Make sure that both sides of the stepladder are locked into place. Ask someone to hold it for you, if possible. Clearly mark and make sure that you can see: Any grab bars or handrails. First and last steps. Where the edge of each step is. Use tools that help you move around (mobility aids) if they are needed. These include: Canes. Walkers. Scooters. Crutches. Turn on the lights when you go into a dark area. Replace any light bulbs as soon as they burn out. Set up your furniture so you have a clear path. Avoid moving your furniture around. If any of your floors are uneven, fix them. If there are any pets around you, be aware of where they are. Review your medicines with your doctor. Some medicines can make you feel dizzy. This can increase your chance of falling. Ask your doctor what other things that you can do to help prevent falls. This information is not intended to replace advice given to you by your health care provider. Make sure you discuss any questions you have with your health care provider. Document Released: 07/03/2009 Document Revised: 02/12/2016 Document Reviewed: 10/11/2014 Elsevier Interactive Patient Education  2017 [employer].

## 2022-10-21 NOTE — Progress Notes (Signed)
Subjective:   Jimmy Bradshaw is a 77 y.o. male who presents for Medicare Annual/Subsequent preventive examination.  Review of Systems     Cardiac Risk Factors include: advanced age (>78mn, >>71women);diabetes mellitus;hypertension;male gender;dyslipidemia     Objective:    Today's Vitals   10/21/22 1409  BP: 110/62  Weight: 207 lb (93.9 kg)  Height: '5\' 11"'$  (1.803 m)   Body mass index is 28.87 kg/m.     10/21/2022    2:35 PM 10/07/2022    4:35 PM 10/15/2021    2:18 PM 08/06/2021    4:17 PM 08/05/2021    6:49 AM 07/03/2020    7:49 AM 01/23/2020   10:37 AM  Advanced Directives  Does Patient Have a Medical Advance Directive? Yes Yes Yes Yes Yes No Yes  Type of Advance Directive Living will Living will [place]    Does patient want to make changes to medical advance directive? No - Patient declined No - Patient declined   No - Patient declined  No - Patient declined  Copy of [place] Chart? No - copy requested  No - copy requested  No - copy requested    Would patient like information on creating a medical advance directive?   No - Patient declined   No - Patient declined     Current Medications (verified) Outpatient Encounter Medications as of 10/21/2022  Medication Sig   allopurinol (ZYLOPRIM) 100 MG tablet TAKE 1 TABLET EVERY DAY (TOTAL '400MG'$ /DAY)   allopurinol (ZYLOPRIM) 300 MG tablet TAKE 1 TABLET EVERY DAY (TOTAL '400MG'$ /DAY)   lisinopril (ZESTRIL) 2.5 MG tablet TAKE 1 TABLET EVERY DAY   metoprolol succinate (TOPROL-XL) 25 MG 24 hr tablet TAKE 1 AND 1/2 TABLETS EVERY DAY   rosuvastatin (CRESTOR) 10 MG tablet TAKE 1 TABLET EVERY DAY   [DISCONTINUED] docusate sodium (COLACE) 100 MG capsule Take 1 capsule (100 mg total) by mouth daily as needed for up to 30 doses.   [DISCONTINUED] oxyCODONE-acetaminophen (PERCOCET) 5-325 MG tablet Take 1 tablet  by mouth every 4 (four) hours as needed for up to 18 doses for severe pain.   [DISCONTINUED] FLUZONE HIGH-DOSE QUADRIVALENT 0.7 ML SUSY  (Patient not taking: Reported on 08/30/2022)   No facility-administered encounter medications on file as of 10/21/2022.    Allergies (verified) Pravastatin and Penicillins   History: Past Medical History:  Diagnosis Date   Anxiety    Arthritis    HANDS,  SHOULDERS   Bladder neck obstruction    BPH (benign prostatic hypertrophy)    Frequency of urination    H/O: gout    History of colon polyps    Hyperlipidemia    Nocturia    PAF (paroxysmal [place] fibrillation) ([place]    Type 2 diabetes mellitus ([place]    diet controlled   Urgency of urination    Wears hearing aid    BILATERAL   Past Surgical History:  Procedure Laterality Date   ATRIAL FIBRILLATION ABLATION N/A 08/05/2021   Procedure: ATRIAL FIBRILLATION ABLATION;  Surgeon: CConstance Haw MD;  [facility];  Service: Cardiovascular;  Laterality: N/A;   CARDIOVERSION N/A 10/26/2016   Procedure: CARDIOVERSION;  Surgeon: DLarey Dresser MD;  [facility]  Service: Cardiovascular;  Laterality: N/A;   CARDIOVERSION N/A 04/17/2019   Procedure: CARDIOVERSION;  Surgeon: TSueanne Margarita MD;  [place] [facility]  Service: Cardiovascular;  Laterality:  N/A;   COLONOSCOPY  08-30-2011, 2017   CYSTOSCOPY/URETEROSCOPY/HOLMIUM LASER/STENT PLACEMENT Left 10/07/2022   Procedure: CYSTOSCOPY/LEFT RETROGRADE PYLOGRAM/ URETEROSCOPY/HOLMIUM LASER/STENT PLACEMENT;  Surgeon: Janith Lima, MD;  [facility];  Service: Urology;  Laterality: Left;   I & D KNEE WITH POLY EXCHANGE Left 10/08/2014   Procedure: IRRIGATION AND DEBRIDEMENT LEFT UNI KNEE ARTHOPLASTY  WITH POLY EXCHANGE;  Surgeon: Mauri Pole, MD;  [facility];  Service: Orthopedics;  Laterality: Left;   KNEE ARTHROSCOPY Left 11/ 2013   PARTIAL KNEE ARTHROPLASTY Left 02/05/2013   Procedure: LEFT KNEE MEDIAL  UNICOMPARTMENTAL KNEE;  Surgeon: Mauri Pole, MD;  [facility];  Service: Orthopedics;  Laterality: Left;   PICC LINE INSERTION     and removed later   TRANSURETHRAL RESECTION OF PROSTATE N/A 09/30/2014   Procedure: TRANSURETHRAL RESECTION OF THE PROSTATE WITH GYRUS INSTRUMENTS;  Surgeon: Bernestine Amass, MD;  [facility];  Service: Urology;  Laterality: N/A;   TRANSURETHRAL RESECTION OF PROSTATE N/A 07/03/2020   Procedure: TRANSURETHRAL RESECTION OF THE PROSTATE (TURP);  Surgeon: Franchot Gallo, MD;  Location: [facility];  Service: Urology;  Laterality: N/A;  1 HR   Family History  Adopted: Yes  Problem Relation Age of Onset   Colon cancer Father    Social History   Socioeconomic History   Marital status: Married    Spouse name: Not on file   Number of children: Not on file   Years of education: Not on file   Highest education level: Not on file  Occupational History   Not on file  Tobacco Use   Smoking status: Former    Packs/day: 1.50    Years: 25.00    Total pack years: 37.50    Types: Cigarettes    Quit date: 12/08/1998    Years since quitting: 23.8    Passive exposure: Past   Smokeless tobacco: Never   Tobacco comments:    Former smoker 09/02/21  Vaping Use   Vaping Use: Never used  Substance and Sexual Activity   Alcohol use: Yes    Alcohol/week: 1.0 standard drink of alcohol    Types: 1 Glasses of wine per week    Comment: RARE - a sip, occassional   Drug use: No   Sexual activity: Not Currently  Other Topics Concern   Not on file  Social History Narrative   Not on file   Social Determinants of Health   Financial Resource Strain: Low Risk  (10/21/2022)   Overall Financial Resource Strain (CARDIA)    Difficulty of Paying Living Expenses: Not hard at all  Food Insecurity: No Food Insecurity (10/21/2022)   Hunger Vital Sign    Worried About Running Out of Food in the Last Year: Never true    Ran Out of Food in  the Last Year: Never true  Transportation Needs: No Transportation Needs (10/21/2022)   PRAPARE - Hydrologist (Medical): No    Lack of Transportation (Non-Medical): No  Physical Activity: Sufficiently Active (10/21/2022)   Exercise Vital Sign    Days of Exercise per Week: 5 days    Minutes of Exercise per Session: 30 min  Stress: No Stress Concern Present (10/21/2022)   [place]    Feeling of Stress : Not at all  Social Connections: Socially Integrated (10/21/2022)   Social Connection and Isolation Panel [NHANES]    Frequency of Communication with  Friends and Family: More than three times a week    Frequency of Social Gatherings with Friends and Family: Three times a week    Attends Religious Services: More than 4 times per year    Active Member of Clubs or Organizations: Yes    Attends Archivist Meetings: More than 4 times per year    Marital Status: Married    Tobacco Counseling Counseling given: Not Answered Tobacco comments: Former smoker 09/02/21   Clinical Intake:  Pre-visit preparation completed: Yes  Pain : No/denies pain  Diabetes: Yes CBG done?: No Did pt. bring in CBG monitor from home?: No  How often do you need to have someone help you when you read instructions, pamphlets, or other written materials from your doctor or pharmacy?: 1 - Never  Diabetic?Yes   Nutrition Risk Assessment:  Has the patient had any N/V/D within the last 2 months?  No  Does the patient have any non-healing wounds?  No  Has the patient had any unintentional weight loss or weight gain?  No   Diabetes:  Is the patient diabetic?  Yes  If diabetic, was a CBG obtained today?  No  Did the patient bring in their glucometer from home?  No  How often do you monitor your CBG's? As needed .   Financial Strains and Diabetes Management:  Are you having any financial strains with the  device, your supplies or your medication? No .  Does the patient want to be seen by Chronic Care Management for management of their diabetes?  No  Would the patient like to be referred to a Nutritionist or for Diabetic Management?  No   Diabetic Exams:  Diabetic Eye Exam: Completed will request notes from Dr. Patrice Paradise  Diabetic Foot Exam: Completed 05/18/22   Interpreter Needed?: No  Information entered by :: Denman George LPN   Activities of Daily Living    10/21/2022    2:35 PM 10/07/2022    4:41 PM  In your present state of health, do you have any difficulty performing the following activities:  Hearing? 0 0  Vision? 0 0  Difficulty concentrating or making decisions? 0 0  Walking or climbing stairs? 0 0  Dressing or bathing? 0 0  Doing errands, shopping? 0   Preparing Food and eating ? N   Using the Toilet? N   In the past six months, have you accidently leaked urine? N   Do you have problems with loss of bowel control? N   Managing your Medications? N   Managing your Finances? N   Housekeeping or managing your Housekeeping? N     Patient Care Team: Susy Frizzle, MD as PCP - General (Family Medicine) Nahser, Wonda Cheng, MD as PCP - Cardiology (Cardiology) Constance Haw, MD as PCP - Electrophysiology (Cardiology) Edythe Clarity, [facility] as Pharmacist (Pharmacist) Janith Lima, MD as Consulting Physician (Urology) Georgeann Oppenheim, MD as Consulting Physician (Ophthalmology) Wilford Corner, MD as Consulting Physician (Gastroenterology) Zadie Rhine Clent Demark, MD as Consulting Physician (Ophthalmology)  Indicate any recent Medical Services you may have received from other than [facility] providers in the past year (date may be approximate).     Assessment:   This is a routine wellness examination for [place].  Hearing/Vision screen Hearing Screening - Comments:: Bilateral hearing aids   Vision Screening - Comments:: up to date with routine eye exams with Dr. Patrice Paradise     Dietary issues and exercise activities discussed: Current Exercise Habits: Home  exercise routine, Type of exercise: walking, Time (Minutes): 30, Frequency (Times/Week): 5, Weekly Exercise (Minutes/Week): 150, Intensity: Mild   Goals Addressed             This Visit's Progress    COMPLETED: Pharmacy Care Plan:         Depression Screen    10/21/2022    2:34 PM 05/18/2022    9:32 AM 10/15/2021    2:14 PM 01/23/2020   10:34 AM 11/07/2018    3:36 PM 08/11/2018   12:14 PM 04/26/2018    9:34 AM  PHQ 2/9 Scores  PHQ - 2 Score 0 0 0 0 1 0 0  PHQ- 9 Score    0       Fall Risk    10/21/2022    2:10 PM 05/18/2022    9:33 AM 10/15/2021    2:19 PM 10/17/2020    8:06 AM 01/23/2020   10:34 AM  Fall Risk   Falls in the past year? 0 0 0 0 1  Number falls in past yr: 0 0 0 0 0  Injury with Fall? 0 0 0 0 0  Risk for fall due to :  No Fall Risks No Fall Risks No Fall Risks No Fall Risks  Follow up Falls prevention discussed;Education provided;Falls evaluation completed Falls prevention discussed Falls prevention discussed Falls evaluation completed Falls evaluation completed    FALL RISK PREVENTION PERTAINING TO THE HOME:  Any stairs in or around the home? Yes  If so, are there any without handrails? No  Home free of loose throw rugs in walkways, pet beds, electrical cords, etc? Yes  Adequate lighting in your home to reduce risk of falls? Yes   ASSISTIVE DEVICES UTILIZED TO PREVENT FALLS:  Life alert? No  Use of a cane, walker or w/c? No  Grab bars in the bathroom? Yes  Shower chair or bench in shower? No  Elevated toilet seat or a handicapped toilet? Yes   TIMED UP AND GO:  Was the test performed? Yes .  Length of time to ambulate 10 feet: 6 sec.   Gait steady and fast without use of assistive device  Cognitive Function:        10/21/2022    2:36 PM 10/15/2021    2:20 PM  6CIT Screen  What Year? 0 points 0 points  What month? 0 points 0 points  What time? 0 points 0 points   Count back from 20 0 points 0 points  Months in reverse 0 points 0 points  Repeat phrase 0 points 0 points  Total Score 0 points 0 points    Immunizations Immunization History  Administered Date(s) Administered   Fluad Quad(high Dose 65+) 05/10/2019, 06/24/2022   Influenza, High Dose Seasonal PF 06/15/2017, 07/06/2018   Influenza,inj,Quad PF,6+ Mos 06/19/2014, 07/28/2015, 06/18/2016   Influenza-Unspecified 07/04/2021   PFIZER(Purple Top)SARS-COV-2 Vaccination 10/25/2019, 11/15/2019, 05/19/2020   Pfizer Covid-19 Vaccine Bivalent Booster 24yr & up 04/08/2021   Pneumococcal Conjugate-13 09/28/2013, 05/10/2019   Pneumococcal Polysaccharide-23 07/28/2015   Tdap 09/25/2006   Unspecified SARS-COV-2 Vaccination 06/24/2022   Zoster Recombinat (Shingrix) 03/05/2021   Zoster, Live 05/09/2008    TDAP status: Due, Education has been provided regarding the importance of this vaccine. Advised may receive this vaccine at local pharmacy or Health Dept. Aware to provide a copy of the vaccination record if obtained from local pharmacy or Health Dept. Verbalized acceptance and understanding.  Flu Vaccine status: Up to date  Pneumococcal vaccine status: Up to  date  Covid-19 vaccine status: Completed vaccines  Qualifies for Shingles Vaccine? Yes   Zostavax completed Yes   Shingrix Completed?: No.    Education has been provided regarding the importance of this vaccine. Patient has been advised to call insurance company to determine out of pocket expense if they have not yet received this vaccine. Advised may also receive vaccine at local pharmacy or Health Dept. Verbalized acceptance and understanding.  Screening Tests Health Maintenance  Topic Date Due   DTaP/Tdap/Td (2 - Td or Tdap) 09/25/2016   Diabetic kidney evaluation - Urine ACR  08/12/2019   OPHTHALMOLOGY EXAM  10/18/2019   Zoster Vaccines- Shingrix (2 of 2) 04/30/2021   COVID-19 Vaccine (6 - 2023-24 season) 08/19/2022   HEMOGLOBIN A1C   11/13/2022   FOOT EXAM  05/19/2023   Diabetic kidney evaluation - eGFR measurement  10/08/2023   Medicare Annual Wellness (AWV)  10/22/2023   COLONOSCOPY (Pts 45-52yr Insurance coverage will need to be confirmed)  08/04/2026   Pneumonia Vaccine 77 Years old  Completed   INFLUENZA VACCINE  Completed   Hepatitis C Screening  Completed   HPV VACCINES  Aged Out    Health Maintenance  Health Maintenance Due  Topic Date Due   DTaP/Tdap/Td (2 - Td or Tdap) 09/25/2016   Diabetic kidney evaluation - Urine ACR  08/12/2019   OPHTHALMOLOGY EXAM  10/18/2019   Zoster Vaccines- Shingrix (2 of 2) 04/30/2021   COVID-19 Vaccine (6 - 2023-24 season) 08/19/2022    Colorectal cancer screening: Type of screening: Colonoscopy. Completed 08/04/16. Repeat every 10 years  Lung Cancer Screening: (Low Dose CT Chest recommended if Age 76-80years, 30 pack-year currently smoking OR have quit w/in 15years.) does not qualify.   Lung Cancer Screening Referral: n/a   Additional Screening:  Hepatitis C Screening: does qualify; Completed 11/25/15  Vision Screening: Recommended annual ophthalmology exams for early detection of glaucoma and other disorders of the eye. Is the patient up to date with their annual eye exam?  Yes  Who is the provider or what is the name of the office in which the patient attends annual eye exams? Dr. SGeorgeann Oppenheim If pt is not established with a provider, would they like to be referred to a provider to establish care? No .   Dental Screening: Recommended annual dental exams for proper oral hygiene  Community Resource Referral / Chronic Care Management: CRR required this visit?  No   CCM required this visit?  No      Plan:     I have personally reviewed and noted the following in the patient's chart:   Medical and social history Use of alcohol, tobacco or illicit drugs  Current medications and supplements including opioid prescriptions. Patient is not currently taking  opioid prescriptions. Functional ability and status Nutritional status Physical activity Advanced directives List of other physicians Hospitalizations, surgeries, and ER visits in previous 12 months Vitals Screenings to include cognitive, depression, and falls Referrals and appointments  In addition, I have reviewed and discussed with patient certain preventive protocols, quality metrics, and best practice recommendations. A written personalized care plan for preventive services as well as general preventive health recommendations were provided to patient.     SDenman GeorgeBFarnam LWy[place]  25/0/2774  Nurse Notes: No concerns

## 2022-10-28 ENCOUNTER — Ambulatory Visit: Payer: Medicare HMO | Admitting: Podiatry

## 2022-10-28 ENCOUNTER — Encounter (HOSPITAL_COMMUNITY): Payer: Self-pay | Admitting: *Deleted

## 2022-10-28 DIAGNOSIS — M898X9 Other specified disorders of bone, unspecified site: Secondary | ICD-10-CM | POA: Diagnosis not present

## 2022-10-28 DIAGNOSIS — M722 Plantar fascial fibromatosis: Secondary | ICD-10-CM | POA: Diagnosis not present

## 2022-10-28 DIAGNOSIS — E1140 Type 2 diabetes mellitus with diabetic neuropathy, unspecified: Secondary | ICD-10-CM | POA: Diagnosis not present

## 2022-10-28 NOTE — Patient Instructions (Signed)

## 2022-10-28 NOTE — Progress Notes (Signed)
Subjective:   Patient ID: Jimmy Bradshaw, male   DOB: 77 y.o.   MRN: [id number]   HPI Chief Complaint  Patient presents with   Foot Problem    Pain that is intermittent in the left heel and lateral side of foot    77 year old male presents for above concerns.  States that heel pain that he originally made the appointment for has resolved.  His main concern is pain prominence of the fifth metatarsal on the left side.  This been a chronic issue.  He has previously been seen for this by another provider.  He is brought in because of discomfort in shoes.  No open sores.    Review of Systems  All other systems reviewed and are negative.  Past Medical History:  Diagnosis Date   Anxiety    Arthritis    HANDS,  SHOULDERS   Bladder neck obstruction    BPH (benign prostatic hypertrophy)    Frequency of urination    H/O: gout    History of colon polyps    Hyperlipidemia    Nocturia    PAF (paroxysmal atrial fibrillation) ([place])    Type 2 diabetes mellitus ([place])    diet controlled   Urgency of urination    Wears hearing aid    BILATERAL    Past Surgical History:  Procedure Laterality Date   ATRIAL FIBRILLATION ABLATION N/A 08/05/2021   Procedure: ATRIAL FIBRILLATION ABLATION;  Surgeon: Constance Haw, MD;  Location: [place] [facility];  Service: Cardiovascular;  Laterality: N/A;   CARDIOVERSION N/A 10/26/2016   Procedure: CARDIOVERSION;  Surgeon: Larey Dresser, MD;  Location: [place];  Service: Cardiovascular;  Laterality: N/A;   CARDIOVERSION N/A 04/17/2019   Procedure: CARDIOVERSION;  Surgeon: Sueanne Margarita, MD;  Location: [facility];  Service: Cardiovascular;  Laterality: N/A;   COLONOSCOPY  08-30-2011, 2017   CYSTOSCOPY/URETEROSCOPY/HOLMIUM LASER/STENT PLACEMENT Left 10/07/2022   Procedure: CYSTOSCOPY/LEFT RETROGRADE PYLOGRAM/ URETEROSCOPY/HOLMIUM LASER/STENT PLACEMENT;  Surgeon: Janith Lima, MD;  [facility];  Service: Urology;  Laterality: Left;   I & D  KNEE WITH POLY EXCHANGE Left 10/08/2014   Procedure: IRRIGATION AND DEBRIDEMENT LEFT UNI KNEE ARTHOPLASTY  WITH POLY EXCHANGE;  Surgeon: Mauri Pole, MD;  [facility];  Service: Orthopedics;  Laterality: Left;   KNEE ARTHROSCOPY Left 11/ 2013   PARTIAL KNEE ARTHROPLASTY Left 02/05/2013   Procedure: LEFT KNEE MEDIAL UNICOMPARTMENTAL KNEE;  Surgeon: Mauri Pole, MD;  [facility];  Service: Orthopedics;  Laterality: Left;   PICC LINE INSERTION     and removed later   TRANSURETHRAL RESECTION OF PROSTATE N/A 09/30/2014   Procedure: TRANSURETHRAL RESECTION OF THE PROSTATE WITH GYRUS INSTRUMENTS;  Surgeon: Bernestine Amass, MD;  Location: [facility];  Service: Urology;  Laterality: N/A;   TRANSURETHRAL RESECTION OF PROSTATE N/A 07/03/2020   Procedure: TRANSURETHRAL RESECTION OF THE PROSTATE (TURP);  Surgeon: Franchot Gallo, MD;  [facility];  Service: Urology;  Laterality: N/A;  1 HR     Current Outpatient Medications:    allopurinol (ZYLOPRIM) 100 MG tablet, TAKE 1 TABLET EVERY DAY (TOTAL 400MG/DAY), Disp: 90 tablet, Rfl: 1   allopurinol (ZYLOPRIM) 300 MG tablet, TAKE 1 TABLET EVERY DAY (TOTAL 400MG/DAY), Disp: 90 tablet, Rfl: 1   lisinopril (ZESTRIL) 2.5 MG tablet, TAKE 1 TABLET EVERY DAY, Disp: 90 tablet, Rfl: 0   metoprolol succinate (TOPROL-XL) 25 MG 24 hr tablet, TAKE 1 AND 1/2 TABLETS EVERY DAY, Disp:  135 tablet, Rfl: 2   rosuvastatin (CRESTOR) 10 MG tablet, TAKE 1 TABLET EVERY DAY, Disp: 90 tablet, Rfl: 3  Allergies  Allergen Reactions   Pravastatin Other (See Comments)    Headache and legs hurt   Penicillins Rash    Reaction as a child. Tolerated a penicillin shot w/o reaction 2018          Objective:  Physical Exam  General: AAO x3, NAD  Dermatological: Skin is warm, dry and supple bilateral. There are no open sores, no preulcerative lesions, no rash or signs of infection present.  Vascular: Dorsalis Pedis artery and  Posterior Tibial artery pedal pulses are 2/4 bilateral with immedate capillary fill time.  There is no pain with calf compression, swelling, warmth, erythema.   Neruologic: Grossly intact via light touch bilateral.   Musculoskeletal: Left side there is no pain in the heel.  There is a prominent fifth metatarsal base present with tenderness for prolonged prominence.  No pain to the peroneal tendon.  There is no other areas of discomfort.  Gait: Unassisted, Nonantalgic.       Assessment:   Prominent fifth metatarsal base left side     Plan:  -Treatment options discussed including all alternatives, risks, and complications -Etiology of symptoms were discussed -X-rays were obtained reviewed of the left foot.  3 views were obtained.  No evidence of acute fracture.  Posterior calcaneal spur present.  Spurring off of the metatarsal base. -Heel pain is resolved.  Continue supportive shoes, rehab exercises to prevent reoccurrence.  Most of his pain today is on the fifth metatarsal base which is chronic issue.  He has tried shoe modifications, offloading padding also the improvement.  He is asked about surgery.  We could consider sending this down there is a risk of tendon injury during this.  Not unreasonable to consider doing the surgery should help decrease the prominence.  We discussed the procedures postop course.  He will consider his options.    Trula Slade DPM

## 2022-11-09 DIAGNOSIS — N201 Calculus of ureter: Secondary | ICD-10-CM | POA: Diagnosis not present

## 2022-12-27 ENCOUNTER — Other Ambulatory Visit: Payer: Self-pay | Admitting: Family Medicine

## 2022-12-27 DIAGNOSIS — M1A09X0 Idiopathic chronic gout, multiple sites, without tophus (tophi): Secondary | ICD-10-CM

## 2023-01-09 ENCOUNTER — Other Ambulatory Visit: Payer: Self-pay | Admitting: Family Medicine

## 2023-01-09 DIAGNOSIS — M1A09X0 Idiopathic chronic gout, multiple sites, without tophus (tophi): Secondary | ICD-10-CM

## 2023-01-10 DIAGNOSIS — H2511 Age-related nuclear cataract, right eye: Secondary | ICD-10-CM | POA: Diagnosis not present

## 2023-01-10 DIAGNOSIS — H43821 Vitreomacular adhesion, right eye: Secondary | ICD-10-CM | POA: Diagnosis not present

## 2023-01-10 DIAGNOSIS — H35373 Puckering of macula, bilateral: Secondary | ICD-10-CM | POA: Diagnosis not present

## 2023-04-05 DIAGNOSIS — H2511 Age-related nuclear cataract, right eye: Secondary | ICD-10-CM | POA: Diagnosis not present

## 2023-04-05 DIAGNOSIS — H0102B Squamous blepharitis left eye, upper and lower eyelids: Secondary | ICD-10-CM | POA: Diagnosis not present

## 2023-04-05 DIAGNOSIS — H0102A Squamous blepharitis right eye, upper and lower eyelids: Secondary | ICD-10-CM | POA: Diagnosis not present

## 2023-04-05 DIAGNOSIS — Z961 Presence of intraocular lens: Secondary | ICD-10-CM | POA: Diagnosis not present

## 2023-04-05 DIAGNOSIS — H35371 Puckering of macula, right eye: Secondary | ICD-10-CM | POA: Diagnosis not present

## 2023-04-05 DIAGNOSIS — H11132 Conjunctival pigmentations, left eye: Secondary | ICD-10-CM | POA: Diagnosis not present

## 2023-04-05 DIAGNOSIS — D3132 Benign neoplasm of left choroid: Secondary | ICD-10-CM | POA: Diagnosis not present

## 2023-04-25 DIAGNOSIS — H2511 Age-related nuclear cataract, right eye: Secondary | ICD-10-CM | POA: Diagnosis not present

## 2023-04-28 DIAGNOSIS — H2511 Age-related nuclear cataract, right eye: Secondary | ICD-10-CM | POA: Diagnosis not present

## 2023-04-28 DIAGNOSIS — H268 Other specified cataract: Secondary | ICD-10-CM | POA: Diagnosis not present

## 2023-05-12 ENCOUNTER — Encounter: Payer: Self-pay | Admitting: Cardiovascular Disease

## 2023-05-12 NOTE — Progress Notes (Signed)
Patient ID: Jimmy Bradshaw, male   DOB: 07-05-1946, 77 y.o.   MRN: [id number]    Date:  05/13/2023   ID:  LOC WROBLESKI, DOB June 02, 1946, MRN [id number]  PCP:  Donita Brooks, MD  Primary Cardiologist:  Noelie Renfrow  Chief Complaint  Patient presents with   Atrial Fibrillation        Problem list: 1. Paroxysmal atrial fibrillation  - CHADS2VASC score of 3  ( age, HTN, DM)  2. Hypertension 3. Hyperlipidemia 4. Diabetes mellitus 5. BPH - s/p TURP 6. S/p left TKA  - with supsequent infection of his prosthesis requiring re-do surgery .     Jimmy Bradshaw is a 77 y.o. male history of hypertension, hyperlipidemia, type 2 diabetes mellitus, anxiety, BPH. On 09/30/2014 he underwent TURP procedure and then a week later underwent irrigation and debridement of the left knee.  PICC line was placed and he was started on antibiotics for 4 weeks.  One of these surgeries patient supposedly went into atrial fibrillation with a rapid ventricular response. There are no EKGs or telemetry strips which confirmed this nor is there any documentation in the notes; either in the discharge summaries or with anesthesia.   The patient currently denies nausea, vomiting, fever, chest pain, shortness of breath, orthopnea, dizziness, PND, cough, congestion, abdominal pain, hematochezia, melena, lower extremity edema, claudication.   Feb. 19, 2016   He was getting his echo today and went in to rapid afib.  Still asymptomatic.  Was added on to my schedule.   November 21, 2014:  Jimmy Bradshaw is seen back today for follow up of his paroxysmal atrial fib.    We started him on Xarelto . Unfortunately, he had some bleeding in his urine .  He had just had recent prostate surgery ( [name]) .   He held his Xarelto for the next week.  He is doing better.   March 11, 2015: Jimmy Bradshaw has a hx of paroxysmal atrial fib. Has Eliquis but has not started it yet  His hematura has resolved.  Checks his pulse and has not had recurrent atrial fib to  his knowledge   Doing well.  Is having some leg pain.    Thinks it may be due to the pravachol  Still having knee problems .    Jan. 17, 2017:  Jimmy Bradshaw is doing well He has paroxysmal afib  - is back in Afib today - cannot tell that his HR is irregular.  No CP , no dyspnea.  He is not having any further urinary bleeding . His knee infection has resolved.   Jan. 30, 2018:  Jimmy Bradshaw is seen for visit  Dr. Jeanice Lim increased the Toprol XL to 50 mg a day   January 10, 2017:  Jimmy Bradshaw is seen today for follow up of his atrial fib.  He had a cardioversion Feb. 6, 2018: GXT  Feb. 13, 2018 showed mild widening of his QRS with peak exercise. We decreased his flecainide from 100 mg twice a day to 50 mg twice a day at that point. In on Flecainide 50 mg BID , metoprolol 25 mg a day and Eliquis  Feels well Left knee replacement is working well   Dec. 19, 2018:  No CP or dysnea.   Maintaining  NSR  Is watching his diet.   Walks with dogs.    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  Having some vision issues.   Saw Dr. Moses Manners ( at [facility]) Being referred to [facility] for  furhter eval   March 20, 2018:  He was seen today for follow-up of his paroxysmal atrial fibrillation, HTN, HLD .  Marland Kitchen  He has maintained normal sinus rhythm.  He is on flecainide 50 mg twice a day. Has been having some issues with his vision.  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    Monitoring for now  No CP , no dyspnea,   Exercising  Running dogs in agility training .  Bearded collies.   Feb. 24, 2020:  Jimmy Bradshaw is seen today for follow-up of his hypertension, hyperlipidemia, paroxysmal atrial fibrillation.  He apparently has had some fluid retention recently and was started on Lasix. His last echocardiogram was in 2016 which revealed normal left ventricular systolic function with an ejection fraction of 55 to 60%.  We were not able to assess his diastolic function because of the presence of atrial fibrillation.  He  started having swelling in his feet.  Right > left  Started improving ( even before starting lasix )  Started lasix  Does not think he eats too much salt  Exercises regularly ,  - 3 days a week does agility training   No chest pain  Just DOE after running the dogs.   April 03, 2019: Jimmy Bradshaw is seen today for a follow-up visit.  He is back in atrial flutter today. He is completely asymptomatic.  He is been doing well.  He does his normal activities without any difficulty.  Blood pressure and heart rate are normal. Had 2 glasses of tea last night No Etoh recently  No missed dose of flecainide   July 05, 2019: Jimmy Bradshaw is seen today for follow-up of his atrial flutter. Patient has been successfully cardioverted again as of April 17, 2019. Feels well.     January 03, 2020 Jimmy Bradshaw is seen today for follow up of his atrial flutter and HTN Was last by Coralee North last summer.  Has had both covid vaccines.   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  Has some neuropathy issues.   April 22, 2021: Jimmy Bradshaw is seen today Is in atrial flutter today .  He cannot tell if he is out of rhym  We discussed ablation   Aug. 1, 2023 Jimmy Bradshaw is seen today  Hx of atrial flutter, HTN  Had a flutter ablation in Nov. 2022 Was in the ED with dyspnea and CP the next day  Had developed colchicine Much better with colchicine     Getting some exercise Had some heat intolerance  Had an Afib ablation .   Eliquis was stopped   Exercising regularly , silver sneakers .     Aug. 23, 2024  Jimmy Bradshaw is seen for follow up of his atrial flutter , HTN Doing well   No cp , no dyspnea  BP is well controlled.          Wt Readings from Last 3 Encounters:  05/13/23 214 lb 3.2 oz (97.2 kg)  10/21/22 207 lb (93.9 kg)  10/07/22 200 lb (90.7 kg)     Past Medical History:  Diagnosis Date   Anxiety    Arthritis    HANDS,  SHOULDERS   Bladder  neck obstruction    BPH (benign prostatic hypertrophy)    Frequency of urination    H/O: gout    History of colon polyps    Hyperlipidemia  Nocturia    PAF (paroxysmal atrial fibrillation) (HCC)    Type 2 diabetes mellitus (HCC)    diet controlled   Urgency of urination    Wears hearing aid    BILATERAL    Current Outpatient Medications  Medication Sig Dispense Refill   allopurinol (ZYLOPRIM) 100 MG tablet TAKE 1 TABLET EVERY DAY (TOTAL 400MG /DAY) 90 tablet 3   allopurinol (ZYLOPRIM) 300 MG tablet TAKE 1 TABLET EVERY DAY (TOTAL 400MG /DAY) 90 tablet 1   lisinopril (ZESTRIL) 2.5 MG tablet TAKE 1 TABLET EVERY DAY (PATIENT NEEDS COMPLETE PHYSICAL EXAM WITH PCP FOR FUTURE REFILLS) 90 tablet 3   metoprolol succinate (TOPROL-XL) 25 MG 24 hr tablet TAKE 1 AND 1/2 TABLETS EVERY DAY 135 tablet 2   rosuvastatin (CRESTOR) 10 MG tablet TAKE 1 TABLET EVERY DAY 90 tablet 3   No current facility-administered medications for this visit.    Allergies:    Allergies  Allergen Reactions   Pravastatin Other (See Comments)    Headache and legs hurt   Penicillins Rash    Reaction as a child. Tolerated a penicillin shot w/o reaction 2018    Social History:  The patient  reports that he quit smoking about 24 years ago. His smoking use included cigarettes. He started smoking about 49 years ago. He has a 37.5 pack-year smoking history. He has been exposed to tobacco smoke. He has never used smokeless tobacco. He reports current alcohol use of about 1.0 standard drink of alcohol per week. He reports that he does not use drugs.   Family history:   Family History  Adopted: Yes  Problem Relation Age of Onset   Colon cancer Father     ROS:  Please see the history of present illness.  All other systems reviewed and negative.     Physical Exam: Blood pressure 124/80, pulse 61, height 5\' 11"  (1.803 m), weight 214 lb 3.2 oz (97.2 kg), SpO2 96%.       GEN:  Well nourished, well developed in no  acute distress HEENT: Normal NECK: No JVD; No carotid bruits LYMPHATICS: No lymphadenopathy CARDIAC: RRR   RESPIRATORY:  Clear to auscultation without rales, wheezing or rhonchi  ABDOMEN: Soft, non-tender, non-distended MUSCULOSKELETAL:  No edema; No deformity  SKIN: Warm and dry NEUROLOGIC:  Alert and oriented x 3    EKG:  EKG Interpretation Date/Time:  Friday May 13 2023 13:38:09 EDT Ventricular Rate:  61 PR Interval:  180 QRS Duration:  92 QT Interval:  422 QTC Calculation: 424 R Axis:   60  Text Interpretation: Normal sinus rhythm Normal ECG When compared with ECG of 16-Sep-2021 14:42, Nonspecific T wave abnormality, improved in Inferior leads Nonspecific T wave abnormality no longer evident in Anterolateral leads Confirmed by Kristeen Miss ([id number]) on 05/13/2023 2:09:53 PM     ASSESSMENT AND PLAN:   1.    Paroxysmal Atrial flutter :  CHADS 2VASC score of 3 (Age, HTN, DM).  no further episodes of atrial flutter    3. Essential hypertension:    Blood pressure is well-controlled.   4. Hyperlipidemia:    labs have been stable    5.   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 Is followed by Cresenciano Genre, MD and at [facility] ( Dr. Colbert Coyer)  .      Vesta Mixer, Montez Hageman., MD, [facility] 05/13/2023, 2:09 PM [address],  [address] Office [phone] Pager [phone]

## 2023-05-13 ENCOUNTER — Encounter: Payer: Self-pay | Admitting: Cardiovascular Disease

## 2023-05-13 ENCOUNTER — Ambulatory Visit: Payer: Medicare HMO | Attending: Cardiovascular Disease | Admitting: Cardiovascular Disease

## 2023-05-13 VITALS — BP 124/80 | HR 61 | Ht 71.0 in | Wt 214.2 lb

## 2023-05-13 DIAGNOSIS — I480 Paroxysmal atrial fibrillation: Secondary | ICD-10-CM | POA: Diagnosis not present

## 2023-05-13 DIAGNOSIS — I700 Atherosclerosis of aorta: Secondary | ICD-10-CM

## 2023-05-13 DIAGNOSIS — I4892 Unspecified atrial flutter: Secondary | ICD-10-CM | POA: Diagnosis not present

## 2023-05-13 NOTE — Patient Instructions (Signed)
Medication Instructions:  Your physician recommends that you continue on your current medications as directed. Please refer to the Current Medication list given to you today.  *If you need a refill on your cardiac medications before your next appointment, please call your pharmacy*  Lab Work: None ordered today  Testing/Procedures: None ordered today  Follow-Up: At [facility], you and your health needs are our priority.  As part of our continuing mission to provide you with exceptional heart care, we have created designated Provider Care Teams.  These Care Teams include your primary Cardiologist (physician) and Advanced Practice Providers (APPs -  Physician Assistants and Nurse Practitioners) who all work together to provide you with the care you need, when you need it.  Your next appointment:   12 month(s)  The format for your next appointment:   In Person  Provider:   Philip Nahser, MD   

## 2023-05-29 ENCOUNTER — Other Ambulatory Visit: Payer: Self-pay | Admitting: Cardiology

## 2023-05-31 DIAGNOSIS — H26492 Other secondary cataract, left eye: Secondary | ICD-10-CM | POA: Diagnosis not present

## 2023-05-31 DIAGNOSIS — Z961 Presence of intraocular lens: Secondary | ICD-10-CM | POA: Diagnosis not present

## 2023-06-15 ENCOUNTER — Encounter: Payer: Self-pay | Admitting: Family Medicine

## 2023-06-15 DIAGNOSIS — M1A09X0 Idiopathic chronic gout, multiple sites, without tophus (tophi): Secondary | ICD-10-CM

## 2023-06-16 ENCOUNTER — Other Ambulatory Visit: Payer: Self-pay | Admitting: Family Medicine

## 2023-06-16 DIAGNOSIS — M1A09X0 Idiopathic chronic gout, multiple sites, without tophus (tophi): Secondary | ICD-10-CM

## 2023-06-16 MED ORDER — ALLOPURINOL 300 MG PO TABS: ORAL_TABLET | ORAL | 1 refills | Status: DC

## 2023-06-16 MED ORDER — ALLOPURINOL 300 MG PO TABS: ORAL_TABLET | ORAL | 3 refills | Status: DC

## 2023-06-20 ENCOUNTER — Other Ambulatory Visit: Payer: Self-pay | Admitting: Family Medicine

## 2023-06-21 NOTE — Telephone Encounter (Signed)
Requested medication (s) are due for refill today: Yes  Requested medication (s) are on the active medication list: Yes  Last refill:  04/20/22  Future visit scheduled: No  Notes to clinic:  Unable to refill per protocol, appointment needed.      Requested Prescriptions  Pending Prescriptions Disp Refills   rosuvastatin (CRESTOR) 10 MG tablet [Pharmacy Med Name: Rosuvastatin Calcium Oral Tablet 10 MG] 90 tablet 3    Sig: TAKE 1 TABLET EVERY DAY     Cardiovascular:  Antilipid - Statins 2 Failed - 06/20/2023  5:00 PM      Failed - Cr in normal range and within 360 days    Creat  Date Value Ref Range Status  05/13/2022 1.15 0.70 - 1.28 mg/dL Final   Creatinine, Ser  Date Value Ref Range Status  10/07/2022 1.71 (H) 0.61 - 1.24 mg/dL Final   Creatinine, Urine  Date Value Ref Range Status  08/11/2018 117 20 - 320 mg/dL Final         Failed - Valid encounter within last 12 months    Recent Outpatient Visits           1 year ago COVID   [facility] Donita Brooks, MD   2 years ago Chronic left shoulder pain   [facility] Tanya Nones, Priscille Heidelberg, MD   2 years ago Chronic fatigue   [facility] Donita Brooks, MD   2 years ago Chronic left shoulder pain   [facility] Donita Brooks, MD   3 years ago Type 2 diabetes mellitus with diabetic neuropathy, without long-term current use of insulin (HCC)   [facility] Verdi, Velna Hatchet, MD              Failed - Lipid Panel in normal range within the last 12 months    Cholesterol, Total  Date Value Ref Range Status  01/03/2020 126 100 - 199 mg/dL Final   Cholesterol  Date Value Ref Range Status  05/13/2022 124 <200 mg/dL Final   LDL Cholesterol (Calc)  Date Value Ref Range Status  05/13/2022 75 mg/dL (calc) Final    Comment:    Reference range: <100 . Desirable range <100 mg/dL for primary prevention;   <70 mg/dL for patients with CHD  or diabetic patients  with > or = 2 CHD risk factors. Marland Kitchen LDL-C is now calculated using the Martin-Hopkins  calculation, which is a validated novel method providing  better accuracy than the Friedewald equation in the  estimation of LDL-C.  Horald Pollen et al. Lenox Ahr. 4235;361(44): 2061-2068  (http://education.[employer].com/faq/FAQ164)    HDL  Date Value Ref Range Status  05/13/2022 31 (L) > OR = 40 mg/dL Final  31/54/0086 32 (L) >39 mg/dL Final   Triglycerides  Date Value Ref Range Status  05/13/2022 97 <150 mg/dL Final         Passed - Patient is not pregnant

## 2023-06-30 ENCOUNTER — Encounter: Payer: Self-pay | Admitting: Family Medicine

## 2023-07-01 ENCOUNTER — Other Ambulatory Visit: Payer: Self-pay

## 2023-07-01 DIAGNOSIS — E782 Mixed hyperlipidemia: Secondary | ICD-10-CM

## 2023-07-01 MED ORDER — ROSUVASTATIN CALCIUM 10 MG PO TABS: 10.0000 mg | ORAL_TABLET | Freq: Every day | ORAL | 0 refills | Status: DC

## 2023-08-11 ENCOUNTER — Other Ambulatory Visit: Payer: Self-pay | Admitting: Cardiology

## 2023-10-03 DIAGNOSIS — D3132 Benign neoplasm of left choroid: Secondary | ICD-10-CM | POA: Diagnosis not present

## 2023-10-03 DIAGNOSIS — H35371 Puckering of macula, right eye: Secondary | ICD-10-CM | POA: Diagnosis not present

## 2023-10-03 DIAGNOSIS — H0102A Squamous blepharitis right eye, upper and lower eyelids: Secondary | ICD-10-CM | POA: Diagnosis not present

## 2023-10-03 DIAGNOSIS — H11132 Conjunctival pigmentations, left eye: Secondary | ICD-10-CM | POA: Diagnosis not present

## 2023-10-03 DIAGNOSIS — Z961 Presence of intraocular lens: Secondary | ICD-10-CM | POA: Diagnosis not present

## 2023-10-03 DIAGNOSIS — H0102B Squamous blepharitis left eye, upper and lower eyelids: Secondary | ICD-10-CM | POA: Diagnosis not present

## 2023-10-13 ENCOUNTER — Ambulatory Visit (INDEPENDENT_AMBULATORY_CARE_PROVIDER_SITE_OTHER): Payer: Medicare HMO

## 2023-10-13 ENCOUNTER — Ambulatory Visit (INDEPENDENT_AMBULATORY_CARE_PROVIDER_SITE_OTHER): Payer: Medicare HMO | Admitting: Podiatry

## 2023-10-13 DIAGNOSIS — M216X1 Other acquired deformities of right foot: Secondary | ICD-10-CM | POA: Diagnosis not present

## 2023-10-13 DIAGNOSIS — M778 Other enthesopathies, not elsewhere classified: Secondary | ICD-10-CM | POA: Diagnosis not present

## 2023-10-13 DIAGNOSIS — M7741 Metatarsalgia, right foot: Secondary | ICD-10-CM

## 2023-10-13 NOTE — Progress Notes (Signed)
Subjective: Chief Complaint  Patient presents with   Foot Pain    RM#11 Right foot pain when applying pressure to the foot going on for several months.   78 year old male presents the office today with concerns.  He states that he is getting pain plantar submetatarsal 4.  Last few months.  He cannot see it well but she tried to take pictures and it seems it is getting bigger.  No recent injuries or treatment.  Still hurts to play pickle ball.  No other concerns.   Objective: AAO x3, NAD DP/PT pulses palpable bilaterally, CRT less than 3 seconds There is prominence of the metatarsal heads plantarly with.  The fat pad is doing well right submetatarsal 4 directed along with areas discomfort.  There is minimal callus formation.  There is no underlying ulceration, drainage or any signs of infection. No pain with calf compression, swelling, warmth, erythema  Assessment: Pulm metatarsal head, metatarsalgia right foot  Plan: -All treatment options discussed with the patient including all alternatives, risks, complications.  -X-rays were obtained reviewed.  Multiple views obtained.  There is no evidence of acute fracture.  Posterior calcaneal spurring is present.  Decreased joint space noted along the IPJ's of the lesser toes. -Is a courtesy debrided the minimal callus with any complications or bleeding.  Discussed moisturizer.  I dispensed.  Offloading pads to try to help decrease the pressure to the area.  We will start with this but if needed will consider custom orthotic. -Patient encouraged to call the office with any questions, concerns, change in symptoms.   No follow-ups on file.  Vivi Barrack DPM

## 2023-11-10 ENCOUNTER — Ambulatory Visit: Payer: Medicare HMO | Admitting: Family Medicine

## 2023-11-14 DIAGNOSIS — K52832 Lymphocytic colitis: Secondary | ICD-10-CM | POA: Diagnosis not present

## 2023-11-15 ENCOUNTER — Encounter: Payer: Self-pay | Admitting: Family Medicine

## 2023-11-15 ENCOUNTER — Ambulatory Visit (INDEPENDENT_AMBULATORY_CARE_PROVIDER_SITE_OTHER): Payer: Medicare HMO | Admitting: Family Medicine

## 2023-11-15 VITALS — BP 140/78 | HR 51 | Temp 97.8°F | Ht 71.0 in | Wt 216.0 lb

## 2023-11-15 DIAGNOSIS — N401 Enlarged prostate with lower urinary tract symptoms: Secondary | ICD-10-CM

## 2023-11-15 DIAGNOSIS — N138 Other obstructive and reflux uropathy: Secondary | ICD-10-CM | POA: Diagnosis not present

## 2023-11-15 DIAGNOSIS — I10 Essential (primary) hypertension: Secondary | ICD-10-CM

## 2023-11-15 DIAGNOSIS — M25512 Pain in left shoulder: Secondary | ICD-10-CM

## 2023-11-15 DIAGNOSIS — G8929 Other chronic pain: Secondary | ICD-10-CM

## 2023-11-15 DIAGNOSIS — R739 Hyperglycemia, unspecified: Secondary | ICD-10-CM | POA: Diagnosis not present

## 2023-11-15 NOTE — Progress Notes (Signed)
 Subjective:    Patient ID: Jimmy Bradshaw, male    DOB: [birth year], 78 y.o.   MRN: [id number]  HPI  Patient is a very pleasant 78 year old Caucasian male here today for CPE.  Patient's pneumonia shot is up-to-date.  Flu shot and RSV shot are up-to-date.  He has had 1 dose of the shingles vaccine.  I recommended that he get the second.  Tetanus shot is due but he declines that today.  Colonoscopy was performed in 2022.  He is due again in 2027.  PSA is due.  Blood pressure today is acceptable at 140/78.  He denies any falls or dementia.  He does complain of pain in his left shoulder.  He is requesting a cortisone injection in his left shoulder.   Past Medical History:  Diagnosis Date   Anxiety    Arthritis    HANDS,  SHOULDERS   Bladder neck obstruction    BPH (benign prostatic hypertrophy)    Frequency of urination    H/O: gout    History of colon polyps    Hyperlipidemia    Nocturia    PAF (paroxysmal atrial fibrillation) (HCC)    Type 2 diabetes mellitus (HCC)    diet controlled   Urgency of urination    Wears hearing aid    BILATERAL   Past Surgical History:  Procedure Laterality Date   ATRIAL FIBRILLATION ABLATION N/A 08/05/2021   Procedure: ATRIAL FIBRILLATION ABLATION;  Surgeon: Regan Lemming, MD;  Location: [facility] INVASIVE CV LAB;  Service: Cardiovascular;  Laterality: N/A;   CARDIOVERSION N/A 10/26/2016   Procedure: CARDIOVERSION;  Surgeon: Laurey Morale, MD;  Location: [facility] ENDOSCOPY;  Service: Cardiovascular;  Laterality: N/A;   CARDIOVERSION N/A 04/17/2019   Procedure: CARDIOVERSION;  Surgeon: Quintella Reichert, MD;  Location: [facility] ENDOSCOPY;  Service: Cardiovascular;  Laterality: N/A;   COLONOSCOPY  08-30-2011, 2017   CYSTOSCOPY/URETEROSCOPY/HOLMIUM LASER/STENT PLACEMENT Left 10/07/2022   Procedure: CYSTOSCOPY/LEFT RETROGRADE PYLOGRAM/ URETEROSCOPY/HOLMIUM LASER/STENT PLACEMENT;  Surgeon: Jannifer Hick, MD;  Location: [facility] ORS;  Service: Urology;  Laterality: Left;   I &  D KNEE WITH POLY EXCHANGE Left 10/08/2014   Procedure: IRRIGATION AND DEBRIDEMENT LEFT UNI KNEE ARTHOPLASTY  WITH POLY EXCHANGE;  Surgeon: Shelda Pal, MD;  Location: [facility] ORS;  Service: Orthopedics;  Laterality: Left;   KNEE ARTHROSCOPY Left 11/ 2013   PARTIAL KNEE ARTHROPLASTY Left 02/05/2013   Procedure: LEFT KNEE MEDIAL UNICOMPARTMENTAL KNEE;  Surgeon: Shelda Pal, MD;  Location: [facility] ORS;  Service: Orthopedics;  Laterality: Left;   PICC LINE INSERTION     and removed later   TRANSURETHRAL RESECTION OF PROSTATE N/A 09/30/2014   Procedure: TRANSURETHRAL RESECTION OF THE PROSTATE WITH GYRUS INSTRUMENTS;  Surgeon: Valetta Fuller, MD;  Location: [facility];  Service: Urology;  Laterality: N/A;   TRANSURETHRAL RESECTION OF PROSTATE N/A 07/03/2020   Procedure: TRANSURETHRAL RESECTION OF THE PROSTATE (TURP);  Surgeon: Marcine Matar, MD;  Location: [facility];  Service: Urology;  Laterality: N/A;  1 HR   Current Outpatient Medications on File Prior to Visit  Medication Sig Dispense Refill   allopurinol (ZYLOPRIM) 100 MG tablet TAKE 1 TABLET EVERY DAY (TOTAL 400MG /DAY) 90 tablet 3   allopurinol (ZYLOPRIM) 300 MG tablet TAKE 1 TABLET EVERY DAY (TOTAL 400MG /DAY) 90 tablet 3   lisinopril (ZESTRIL) 2.5 MG tablet TAKE 1 TABLET EVERY DAY (PATIENT NEEDS COMPLETE PHYSICAL EXAM WITH PCP FOR FUTURE REFILLS) 90 tablet 3   metoprolol succinate (TOPROL-XL)  25 MG 24 hr tablet Take 1.5 tablets (37.5 mg total) by mouth daily. 135 tablet 1   rosuvastatin (CRESTOR) 10 MG tablet Take 1 tablet (10 mg total) by mouth daily. 90 tablet 0   No current facility-administered medications on file prior to visit.   Allergies  Allergen Reactions   Pravastatin Other (See Comments)    Headache and legs hurt   Penicillins Rash    Reaction as a child. Tolerated a penicillin shot w/o reaction 2018   Social History   Socioeconomic History   Marital status: Married    Spouse name: Not on  file   Number of children: Not on file   Years of education: Not on file   Highest education level: Not on file  Occupational History   Not on file  Tobacco Use   Smoking status: Former    Current packs/day: 0.00    Average packs/day: 1.5 packs/day for 25.0 years (37.5 ttl pk-yrs)    Types: Cigarettes    Start date: 12/07/1973    Quit date: 12/08/1998    Years since quitting: 24.9    Passive exposure: Past   Smokeless tobacco: Never   Tobacco comments:    Former smoker 09/02/21  Vaping Use   Vaping status: Never Used  Substance and Sexual Activity   Alcohol use: Yes    Alcohol/week: 1.0 standard drink of alcohol    Types: 1 Glasses of wine per week    Comment: RARE - a sip, occassional   Drug use: No   Sexual activity: Not Currently  Other Topics Concern   Not on file  Social History Narrative   Not on file   Social Drivers of Health   Financial Resource Strain: Low Risk  (10/21/2022)   Overall Financial Resource Strain (CARDIA)    Difficulty of Paying Living Expenses: Not hard at all  Food Insecurity: No Food Insecurity (10/21/2022)   Hunger Vital Sign    Worried About Running Out of Food in the Last Year: Never true    Ran Out of Food in the Last Year: Never true  Transportation Needs: No Transportation Needs (10/21/2022)   PRAPARE - Administrator, Civil Service (Medical): No    Lack of Transportation (Non-Medical): No  Physical Activity: Sufficiently Active (10/21/2022)   Exercise Vital Sign    Days of Exercise per Week: 5 days    Minutes of Exercise per Session: 30 min  Stress: No Stress Concern Present (10/21/2022)   [employer] of Occupational Health - Occupational Stress Questionnaire    Feeling of Stress : Not at all  Social Connections: Socially Integrated (10/21/2022)   Social Connection and Isolation Panel [NHANES]    Frequency of Communication with Friends and Family: More than three times a week    Frequency of Social Gatherings with Friends and  Family: Three times a week    Attends Religious Services: More than 4 times per year    Active Member of Clubs or Organizations: Yes    Attends Banker Meetings: More than 4 times per year    Marital Status: Married  Catering manager Violence: Not At Risk (10/21/2022)   Humiliation, Afraid, Rape, and Kick questionnaire    Fear of Current or Ex-Partner: No    Emotionally Abused: No    Physically Abused: No    Sexually Abused: No      Review of Systems  All other systems reviewed and are negative.      Objective:  Physical Exam Vitals reviewed.  Constitutional:      General: He is not in acute distress.    Appearance: Normal appearance. He is well-developed and normal weight. He is not ill-appearing, toxic-appearing or diaphoretic.  HENT:     Head: Normocephalic and atraumatic.     Nose: Nose normal.     Mouth/Throat:     Pharynx: No oropharyngeal exudate or posterior oropharyngeal erythema.  Eyes:     Conjunctiva/sclera: Conjunctivae normal.     Pupils: Pupils are equal, round, and reactive to light.  Cardiovascular:     Rate and Rhythm: Normal rate and regular rhythm.     Heart sounds: Normal heart sounds. No murmur heard.    No friction rub. No gallop.  Pulmonary:     Effort: Pulmonary effort is normal. No respiratory distress.     Breath sounds: Normal breath sounds. No stridor. No wheezing, rhonchi or rales.  Chest:     Chest wall: No tenderness.  Abdominal:     General: Abdomen is flat. Bowel sounds are normal. There is no distension.     Palpations: Abdomen is soft.     Tenderness: There is no abdominal tenderness. There is no guarding.  Musculoskeletal:     Left shoulder: Tenderness present. Decreased range of motion. Decreased strength.     Right lower leg: No edema.     Left lower leg: No edema.  Lymphadenopathy:     Cervical: No cervical adenopathy.  Neurological:     General: No focal deficit present.     Mental Status: He is alert and  oriented to person, place, and time.     Cranial Nerves: No cranial nerve deficit.     Sensory: No sensory deficit.     Motor: Tremor present. No weakness, atrophy, abnormal muscle tone, seizure activity or pronator drift.     Coordination: Coordination normal.     Gait: Gait normal.     Deep Tendon Reflexes: Reflexes normal.            Assessment & Plan:  Primary hypertension - Plan: CBC with Differential/Platelet, COMPLETE METABOLIC PANEL WITH GFR, Lipid panel  BPH with urinary obstruction - Plan: PSA Physical exam today is normal.  Blood pressure is acceptable..  Immunizations are up-to-date.  Did recommend that he get the second dose of the shingles vaccine.  Patient declines a tetanus shot.  Colonoscopy is up-to-date.  Check PSA.  Check CBC CMP and fasting lipid panel.  Using sterile technique, I injected the left shoulder with 2 cc of lidocaine, 2 cc in, and 2 cc of 40 mg/mL Kenalog.  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

## 2023-11-17 MED ORDER — TRIAMCINOLONE ACETONIDE 40 MG/ML IJ SUSP
80.0000 mg | Freq: Once | INTRAMUSCULAR | Status: AC
  Administered 2023-11-15: 80 mg via INTRA_ARTICULAR

## 2023-11-17 NOTE — Addendum Note (Signed)
 Addended by: Venia Carbon K on: 11/17/2023 02:55 PM   Modules accepted: Orders

## 2023-11-19 LAB — TEST AUTHORIZATION

## 2023-11-19 LAB — PSA: PSA: 1.1 ng/mL (ref <=4.00)

## 2023-11-19 LAB — COMPLETE METABOLIC PANEL WITH GFR
AG Ratio: 1.6 (calc) (ref 1.0–2.5)
ALT: 11 U/L (ref 9–46)
AST: 15 U/L (ref 10–35)
Albumin: 3.9 g/dL (ref 3.6–5.1)
Alkaline phosphatase (APISO): 87 U/L (ref 35–144)
BUN: 20 mg/dL (ref 7–25)
CO2: 26 mmol/L (ref 20–32)
Calcium: 8.9 mg/dL (ref 8.6–10.3)
Chloride: 106 mmol/L (ref 98–110)
Creat: 0.83 mg/dL (ref 0.70–1.28)
Globulin: 2.4 g/dL (ref 1.9–3.7)
Glucose, Bld: 123 mg/dL — ABNORMAL HIGH (ref 65–99)
Potassium: 4.4 mmol/L (ref 3.5–5.3)
Sodium: 140 mmol/L (ref 135–146)
Total Bilirubin: 0.8 mg/dL (ref 0.2–1.2)
Total Protein: 6.3 g/dL (ref 6.1–8.1)
eGFR: 91 mL/min/{1.73_m2} (ref >=60)

## 2023-11-19 LAB — LIPID PANEL
Cholesterol: 148 mg/dL (ref <200)
HDL: 34 mg/dL — ABNORMAL LOW (ref >=40)
LDL Cholesterol (Calc): 96 mg/dL
Non-HDL Cholesterol (Calc): 114 mg/dL (ref <130)
Total CHOL/HDL Ratio: 4.4 (calc) (ref <5.0)
Triglycerides: 89 mg/dL (ref <150)

## 2023-11-19 LAB — CBC WITH DIFFERENTIAL/PLATELET
Absolute Lymphocytes: 1564 {cells}/uL (ref 850–3900)
Absolute Monocytes: 646 {cells}/uL (ref 200–950)
Basophils Absolute: 27 {cells}/uL (ref 0–200)
Basophils Relative: 0.4 %
Eosinophils Absolute: 156 {cells}/uL (ref 15–500)
Eosinophils Relative: 2.3 %
HCT: 44.3 % (ref 38.5–50.0)
Hemoglobin: 14.3 g/dL (ref 13.2–17.1)
MCH: 30.7 pg (ref 27.0–33.0)
MCHC: 32.3 g/dL (ref 32.0–36.0)
MCV: 95.1 fL (ref 80.0–100.0)
MPV: 11.2 fL (ref 7.5–12.5)
Monocytes Relative: 9.5 %
Neutro Abs: 4406 {cells}/uL (ref 1500–7800)
Neutrophils Relative %: 64.8 %
Platelets: 186 10*3/uL (ref 140–400)
RBC: 4.66 10*6/uL (ref 4.20–5.80)
RDW: 12.8 % (ref 11.0–15.0)
Total Lymphocyte: 23 %
WBC: 6.8 10*3/uL (ref 3.8–10.8)

## 2023-11-19 LAB — HEMOGLOBIN A1C
Hgb A1c MFr Bld: 6.4 %{Hb} — ABNORMAL HIGH (ref <5.7)
Mean Plasma Glucose: 137 mg/dL
eAG (mmol/L): 7.6 mmol/L

## 2023-11-22 ENCOUNTER — Ambulatory Visit (INDEPENDENT_AMBULATORY_CARE_PROVIDER_SITE_OTHER): Payer: Medicare HMO

## 2023-11-22 DIAGNOSIS — Z23 Encounter for immunization: Secondary | ICD-10-CM

## 2023-11-22 NOTE — Progress Notes (Signed)
 Patient is in office today for a nurse visit for Immunization. Patient Injection was given in the  Left deltoid. Patient tolerated injection well.

## 2023-12-22 ENCOUNTER — Other Ambulatory Visit: Payer: Self-pay | Admitting: Family Medicine

## 2023-12-22 DIAGNOSIS — M1A09X0 Idiopathic chronic gout, multiple sites, without tophus (tophi): Secondary | ICD-10-CM

## 2023-12-22 DIAGNOSIS — E782 Mixed hyperlipidemia: Secondary | ICD-10-CM

## 2023-12-22 MED ORDER — LISINOPRIL 2.5 MG PO TABS: 2.5000 mg | ORAL_TABLET | Freq: Every day | ORAL | 1 refills | Status: DC

## 2023-12-22 MED ORDER — ALLOPURINOL 100 MG PO TABS: ORAL_TABLET | ORAL | 3 refills | Status: AC

## 2023-12-22 MED ORDER — ALLOPURINOL 300 MG PO TABS: ORAL_TABLET | ORAL | 3 refills | Status: AC

## 2023-12-22 MED ORDER — ROSUVASTATIN CALCIUM 10 MG PO TABS: 10.0000 mg | ORAL_TABLET | Freq: Every day | ORAL | 1 refills | Status: DC

## 2023-12-22 NOTE — Telephone Encounter (Signed)
 Patient called and asked which pharmacy to send refills to. 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 I advised to look on the back of the card and call the number for medications or pharmacy to ask which mail order pharmacy is he supposed to use. He says he can't use [facility] and doesn't use [facility], keep [facility] on his list. He says he will call and asked what does he need to do. Advised to call the office back to let us know which pharmacy and the medications will be sent there. He says he's having issues with requesting the medications through MyChart, advised it may be that he needs the pharmacy updated on there. As I was attempting to help, he says he has a call from [facility], I told him to hang up and answer that call.

## 2023-12-22 NOTE — Telephone Encounter (Signed)
 Requested Prescriptions  Pending Prescriptions Disp Refills   allopurinol (ZYLOPRIM) 100 MG tablet 90 tablet 3    Sig: TAKE 1 TABLET EVERY DAY (TOTAL 400MG /DAY)     Endocrinology:  Gout Agents - allopurinol Failed - 12/22/2023  2:15 PM      Failed - Uric Acid in normal range and within 360 days    Uric Acid, Serum  Date Value Ref Range Status  01/23/2020 4.2 4.0 - 8.0 mg/dL Final    Comment:    Therapeutic target for gout patients: <6.0 mg/dL .          Passed - Cr in normal range and within 360 days    Creat  Date Value Ref Range Status  11/15/2023 0.83 0.70 - 1.28 mg/dL Final   Creatinine, Urine  Date Value Ref Range Status  08/11/2018 117 20 - 320 mg/dL Final         Passed - Valid encounter within last 12 months    Recent Outpatient Visits           1 month ago Primary hypertension   [place] [facility] Donita Brooks, MD   1 year ago Encounter for Medicare annual wellness exam   [facility] Pickard, Priscille Heidelberg, MD              Passed - CBC within normal limits and completed in the last 12 months    WBC  Date Value Ref Range Status  11/15/2023 6.8 3.8 - 10.8 Thousand/uL Final   RBC  Date Value Ref Range Status  11/15/2023 4.66 4.20 - 5.80 Million/uL Final   Hemoglobin  Date Value Ref Range Status  11/15/2023 14.3 13.2 - 17.1 g/dL Final  16/06/9603 WILL FOLLOW  Preliminary  07/28/2021 15.9 13.0 - 17.7 g/dL Final   HCT  Date Value Ref Range Status  11/15/2023 44.3 38.5 - 50.0 % Final   Hematocrit  Date Value Ref Range Status  07/28/2021 WILL FOLLOW  Preliminary  07/28/2021 46.6 37.5 - 51.0 % Final   MCHC  Date Value Ref Range Status  11/15/2023 32.3 32.0 - 36.0 g/dL Final    Comment:    For adults, a slight decrease in the calculated MCHC value (in the range of 30 to 32 g/dL) is most likely not clinically significant; however, it should be interpreted with caution in correlation with other red  cell parameters and the patient's clinical condition.    [facility]  Date Value Ref Range Status  11/15/2023 30.7 27.0 - 33.0 pg Final   MCV  Date Value Ref Range Status  11/15/2023 95.1 80.0 - 100.0 fL Final  07/28/2021 WILL FOLLOW  Preliminary  07/28/2021 90 79 - 97 fL Final   No results found for: "PLTCOUNTKUC", "LABPLAT", "POCPLA" RDW  Date Value Ref Range Status  11/15/2023 12.8 11.0 - 15.0 % Final  07/28/2021 WILL FOLLOW  Preliminary  07/28/2021 12.9 11.6 - 15.4 % Final          allopurinol (ZYLOPRIM) 300 MG tablet 90 tablet 3    Sig: TAKE 1 TABLET EVERY DAY (TOTAL 400MG /DAY)     Endocrinology:  Gout Agents - allopurinol Failed - 12/22/2023  2:15 PM      Failed - Uric Acid in normal range and within 360 days    Uric Acid, Serum  Date Value Ref Range Status  01/23/2020 4.2 4.0 - 8.0 mg/dL Final    Comment:    Therapeutic target for gout  patients: <6.0 mg/dL .          Passed - Cr in normal range and within 360 days    Creat  Date Value Ref Range Status  11/15/2023 0.83 0.70 - 1.28 mg/dL Final   Creatinine, Urine  Date Value Ref Range Status  08/11/2018 117 20 - 320 mg/dL Final         Passed - Valid encounter within last 12 months    Recent Outpatient Visits           1 month ago Primary hypertension   [place] [facility] Donita Brooks, MD   1 year ago Encounter for Medicare annual wellness exam   [facility] Pickard, Priscille Heidelberg, MD              Passed - CBC within normal limits and completed in the last 12 months    WBC  Date Value Ref Range Status  11/15/2023 6.8 3.8 - 10.8 Thousand/uL Final   RBC  Date Value Ref Range Status  11/15/2023 4.66 4.20 - 5.80 Million/uL Final   Hemoglobin  Date Value Ref Range Status  11/15/2023 14.3 13.2 - 17.1 g/dL Final  78/29/5621 WILL FOLLOW  Preliminary  07/28/2021 15.9 13.0 - 17.7 g/dL Final   HCT  Date Value Ref Range Status  11/15/2023 44.3 38.5 -  50.0 % Final   Hematocrit  Date Value Ref Range Status  07/28/2021 WILL FOLLOW  Preliminary  07/28/2021 46.6 37.5 - 51.0 % Final   MCHC  Date Value Ref Range Status  11/15/2023 32.3 32.0 - 36.0 g/dL Final    Comment:    For adults, a slight decrease in the calculated MCHC value (in the range of 30 to 32 g/dL) is most likely not clinically significant; however, it should be interpreted with caution in correlation with other red cell parameters and the patient's clinical condition.    [facility]  Date Value Ref Range Status  11/15/2023 30.7 27.0 - 33.0 pg Final   MCV  Date Value Ref Range Status  11/15/2023 95.1 80.0 - 100.0 fL Final  07/28/2021 WILL FOLLOW  Preliminary  07/28/2021 90 79 - 97 fL Final   No results found for: "PLTCOUNTKUC", "LABPLAT", "POCPLA" RDW  Date Value Ref Range Status  11/15/2023 12.8 11.0 - 15.0 % Final  07/28/2021 WILL FOLLOW  Preliminary  07/28/2021 12.9 11.6 - 15.4 % Final          lisinopril (ZESTRIL) 2.5 MG tablet 90 tablet 1    Sig: Take 1 tablet (2.5 mg total) by mouth daily.     Cardiovascular:  ACE Inhibitors Failed - 12/22/2023  2:15 PM      Failed - Last BP in normal range    BP Readings from Last 1 Encounters:  11/15/23 (!) 140/78         Passed - Cr in normal range and within 180 days    Creat  Date Value Ref Range Status  11/15/2023 0.83 0.70 - 1.28 mg/dL Final   Creatinine, Urine  Date Value Ref Range Status  08/11/2018 117 20 - 320 mg/dL Final         Passed - K in normal range and within 180 days    Potassium  Date Value Ref Range Status  11/15/2023 4.4 3.5 - 5.3 mmol/L Final         Passed - Patient is not pregnant      Passed - Valid encounter  within last 6 months    Recent Outpatient Visits           1 month ago Primary hypertension   [facility] [facility] Pickard, Priscille Heidelberg, MD   1 year ago Encounter for Medicare annual wellness exam   [place] [facility] Donita Brooks, MD               rosuvastatin (CRESTOR) 10 MG tablet 90 tablet 1    Sig: Take 1 tablet (10 mg total) by mouth daily.     Cardiovascular:  Antilipid - Statins 2 Failed - 12/22/2023  2:15 PM      Failed - Lipid Panel in normal range within the last 12 months    Cholesterol, Total  Date Value Ref Range Status  01/03/2020 126 100 - 199 mg/dL Final   Cholesterol  Date Value Ref Range Status  11/15/2023 148 <200 mg/dL Final   LDL Cholesterol (Calc)  Date Value Ref Range Status  11/15/2023 96 mg/dL (calc) Final    Comment:    Reference range: <100 . Desirable range <100 mg/dL for primary prevention;   <70 mg/dL for patients with CHD or diabetic patients  with > or = 2 CHD risk factors. Marland Kitchen LDL-C is now calculated using the Martin-Hopkins  calculation, which is a validated novel method providing  better accuracy than the Friedewald equation in the  estimation of LDL-C.  Horald Pollen et al. Lenox Ahr. 4098;119(14): 2061-2068  (http://education.Quest[employer].com/faq/FAQ164)    HDL  Date Value Ref Range Status  11/15/2023 34 (L) > OR = 40 mg/dL Final  78/29/5621 32 (L) >39 mg/dL Final   Triglycerides  Date Value Ref Range Status  11/15/2023 89 <150 mg/dL Final         Passed - Cr in normal range and within 360 days    Creat  Date Value Ref Range Status  11/15/2023 0.83 0.70 - 1.28 mg/dL Final   Creatinine, Urine  Date Value Ref Range Status  08/11/2018 117 20 - 320 mg/dL Final         Passed - Patient is not pregnant      Passed - Valid encounter within last 12 months    Recent Outpatient Visits           1 month ago Primary hypertension   [place] [facility] Pickard, Priscille Heidelberg, MD   1 year ago Encounter for Medicare annual wellness exam   [place] [facility] Pickard, Priscille Heidelberg, MD

## 2023-12-22 NOTE — Telephone Encounter (Signed)
 [place], Molly Maduro   12/22/2023  1:39 PM  Patient is requesting that medications be sent to the following pharmacy and he would like to make this his preferred pharmacy.    [facility] Fax: [phone]

## 2023-12-22 NOTE — Telephone Encounter (Signed)
 Patient called and advised to call cardiology for the refill of metoprolol, he verbalized understanding.

## 2023-12-22 NOTE — Telephone Encounter (Signed)
 Copied from CRM [id number]. Topic: Clinical - Medication Refill >> Dec 22, 2023 12:40 PM Sasha H wrote: Most Recent Primary Care Visit:  Provider: Venia Carbon K  Department: [facility]  Visit Type: CLINICAL SUPPORT  Date: 11/22/2023  Medication: allopurinol (ZYLOPRIM) 100 MG tablet allopurinol (ZYLOPRIM) 300 MG tablet lisinopril (ZESTRIL) 2.5 MG tablet metoprolol succinate (TOPROL-XL) 25 MG 24 hr tablet rosuvastatin (CRESTOR) 10 MG tablet   Has the patient contacted their pharmacy? Yes (Agent: If no, request that the patient contact the pharmacy for the refill. If patient does not wish to contact the pharmacy document the reason why and proceed with request.) (Agent: If yes, when and what did the pharmacy advise?)  Is this the correct pharmacy for this prescription? Yes If no, delete pharmacy and type the correct one.  This is the patient's preferred pharmacy:  [facility] - [place], [place] - [address] [place] [address] Phone: [phone] Fax: [phone]  [facility] - [place], [place] - [address] [place] [address] Phone: [phone] Fax: [phone]  [id number] [place], [place] - [address] AT [facility] [address] [place] [address] Phone: [phone] Fax: [phone]   Has the prescription been filled recently? Yes  Is the patient out of the medication? Yes  Has the patient been seen for an appointment in the last year OR does the patient have an upcoming appointment? Yes  Can we respond through MyChart? Yes  Agent: Please be advised that Rx refills may take up to 3 business days. We ask that you follow-up with your pharmacy.

## 2023-12-23 ENCOUNTER — Other Ambulatory Visit: Payer: Self-pay | Admitting: Family Medicine

## 2023-12-23 ENCOUNTER — Telehealth: Payer: Self-pay | Admitting: Cardiovascular Disease

## 2023-12-23 DIAGNOSIS — E782 Mixed hyperlipidemia: Secondary | ICD-10-CM

## 2023-12-23 MED ORDER — METOPROLOL SUCCINATE ER 25 MG PO TB24: 37.5000 mg | ORAL_TABLET | Freq: Every day | ORAL | 1 refills | Status: DC

## 2023-12-23 NOTE — Telephone Encounter (Signed)
*  STAT* If patient is at the pharmacy, call can be transferred to refill team.   1. Which medications need to be refilled? (please list name of each medication and dose if known)   metoprolol succinate (TOPROL-XL) 25 MG 24 hr tablet   2. Would you like to learn more about the convenience, safety, & potential cost savings by using the [facility]?   3. Are you open to using the [facility] (Type [facility]. ).  4. Which pharmacy/location (including street and city if local pharmacy) is medication to be sent to?  [facility] - [place], [place] - [facility] AT Portal to [facility] Sites   5. Do they need a 30 day or 90 day supply?   90 day  Patient stated he still has some medication and noted he has changed pharmacies.

## 2023-12-23 NOTE — Telephone Encounter (Signed)
 Pt's medication was sent to pt's pharmacy as requested. Confirmation received.

## 2023-12-26 NOTE — Telephone Encounter (Signed)
 Requested medication (s) are due for refill today: pharmacy requesting to change medication.  Requested medication (s) are on the active medication list: yes  Last refill:  12/22/23  Future visit scheduled: no  Notes to clinic:  harmacy comment: Pt has a reported  Statin  allergy.  Should Crestor be dispensed?   .....Marland KitchenMarland KitchenPlease respond with appropriate changes or comment to Pharmacy. Pt has a reported  Statin  allergy.  Should Crestor be dispensed?   .....Marland KitchenMarland KitchenPlease respond with appropriate changes or comment to Pharmacy      Requested Prescriptions  Pending Prescriptions Disp Refills   CRESTOR 10 MG tablet [Pharmacy Med Name: CRESTOR TAB 10MG ]  1    Sig: TAKE 1 TABLET DAILY.     Cardiovascular:  Antilipid - Statins 2 Failed - 12/26/2023  8:55 AM      Failed - Lipid Panel in normal range within the last 12 months    Cholesterol, Total  Date Value Ref Range Status  01/03/2020 126 100 - 199 mg/dL Final   Cholesterol  Date Value Ref Range Status  11/15/2023 148 <200 mg/dL Final   LDL Cholesterol (Calc)  Date Value Ref Range Status  11/15/2023 96 mg/dL (calc) Final    Comment:    Reference range: <100 . Desirable range <100 mg/dL for primary prevention;   <70 mg/dL for patients with CHD or diabetic patients  with > or = 2 CHD risk factors. Marland Kitchen LDL-C is now calculated using the Martin-Hopkins  calculation, which is a validated novel method providing  better accuracy than the Friedewald equation in the  estimation of LDL-C.  Horald Pollen et al. Lenox Ahr. 1308;657(84): 2061-2068  (http://education.[facility].com/faq/FAQ164)    HDL  Date Value Ref Range Status  11/15/2023 34 (L) > OR = 40 mg/dL Final  69/62/9528 32 (L) >39 mg/dL Final   Triglycerides  Date Value Ref Range Status  11/15/2023 89 <150 mg/dL Final         Passed - Cr in normal range and within 360 days    Creat  Date Value Ref Range Status  11/15/2023 0.83 0.70 - 1.28 mg/dL Final   Creatinine, Urine  Date  Value Ref Range Status  08/11/2018 117 20 - 320 mg/dL Final         Passed - Patient is not pregnant      Passed - Valid encounter within last 12 months    Recent Outpatient Visits           1 month ago Primary hypertension   [facility] Pickard, Priscille Heidelberg, MD   1 year ago Encounter for Medicare annual wellness exam   [facility] [facility] Pickard, Priscille Heidelberg, MD

## 2024-01-23 ENCOUNTER — Ambulatory Visit: Payer: Self-pay

## 2024-01-23 NOTE — Telephone Encounter (Signed)
 Called and spoke to patient informed [facility] and if for any reason he did not receive them: pt could call back and the medication could be re-sent.  [facility] and and they will be resending medication.

## 2024-01-23 NOTE — Telephone Encounter (Signed)
 Copied from CRM [id number]. Topic: Clinical - Prescription Issue >> Jan 23, 2024  8:03 AM Tiffany B wrote: Reason for CRM: Patient states he never received his rosuvastatin  (CRESTOR ) 10 MG tablet from the 4/7 request and he has 3 pills left. Patient would like a follow up and would like script sent again.

## 2024-01-23 NOTE — Telephone Encounter (Signed)
 Called pharmacy to check on pt medication: spoke with McCaya C. And was informed pt medication was mailed and delivered to pt home on 4/10: shipment included lisinopril , rosuvastatin , allopurinol  100 &300: order #[id number] UPS tracking number [phone]

## 2024-04-18 ENCOUNTER — Telehealth: Payer: Self-pay

## 2024-04-18 ENCOUNTER — Ambulatory Visit

## 2024-04-18 VITALS — Ht 71.0 in | Wt 216.0 lb

## 2024-04-18 DIAGNOSIS — Z0000 Encounter for general adult medical examination without abnormal findings: Secondary | ICD-10-CM | POA: Diagnosis not present

## 2024-04-18 NOTE — Patient Instructions (Signed)
 Mr. Hoagland , Thank you for taking time out of your busy schedule to complete your Annual Wellness Visit with me. I enjoyed our conversation and look forward to speaking with you again next year. I, as well as your care team,  appreciate your ongoing commitment to your health goals. Please review the following plan we discussed and let me know if I can assist you in the future. Your Game plan/ To Do List    Follow up Visits: Next Medicare AWV with our clinical staff: In 1 year    Have you seen your provider in the last 6 months (3 months if uncontrolled diabetes)? Yes Next Office Visit with your provider: To be scheduled  Clinician Recommendations:  Aim for 30 minutes of exercise or brisk walking, 6-8 glasses of water , and 5 servings of fruits and vegetables each day.       This is a list of the screening recommended for you and due dates:  Health Maintenance  Topic Date Due   Yearly kidney health urinalysis for diabetes  08/12/2019   Eye exam for diabetics  10/18/2019   Complete foot exam   05/19/2023   COVID-19 Vaccine (6 - 2024-25 season) 05/22/2023   DTaP/Tdap/Td vaccine (2 - Td or Tdap) 11/14/2024*   Flu Shot  04/20/2024   Hemoglobin A1C  05/14/2024   Yearly kidney function blood test for diabetes  11/14/2024   Medicare Annual Wellness Visit  04/18/2025   Pneumococcal Vaccine for age over 58  Completed   Hepatitis C Screening  Completed   Zoster (Shingles) Vaccine  Completed   Hepatitis B Vaccine  Aged Out   HPV Vaccine  Aged Out   Meningitis B Vaccine  Aged Out   Colon Cancer Screening  Discontinued  *Topic was postponed. The date shown is not the original due date.    Advanced directives: (ACP Link)Information on Advanced Care Planning can be found at [place]  Secretary of [facility] Advance Health Care Directives Advance Health Care Directives. [email or web address]   Advance Care Planning is important because it:  [x]  Makes sure you receive the medical care that is consistent with  your values, goals, and preferences  [x]  It provides guidance to your family and loved ones and reduces their decisional burden about whether or not they are making the right decisions based on your wishes.  Follow the link provided in your after visit summary or read over the paperwork we have mailed to you to help you started getting your Advance Directives in place. If you need assistance in completing these, please reach out to us  so that we can help you!  See attachments for Preventive Care and Fall Prevention Tips.

## 2024-04-18 NOTE — Telephone Encounter (Signed)
 Patient seen for AWV and states that he is still having problems with left shoulder.  Would like to know if referral to orthopedics is needed or if he needs to come back in for office visit.  Please advise.

## 2024-04-18 NOTE — Progress Notes (Signed)
 Subjective:   Jimmy Bradshaw is a 78 y.o. who presents for a Medicare Wellness preventive visit.  As a reminder, Annual Wellness Visits don't include a physical exam, and some assessments may be limited, especially if this visit is performed virtually. We may recommend an in-person follow-up visit with your provider if needed.  Visit Complete: Virtual I connected with  Jimmy Bradshaw on 04/18/24 by a audio enabled telemedicine application and verified that I am speaking with the correct person using two identifiers.  Patient Location: Home  Provider Location: Home Office  I discussed the limitations of evaluation and management by telemedicine. The patient expressed understanding and agreed to proceed.  Vital Signs: Because this visit was a virtual/telehealth visit, some criteria may be missing or patient reported. Any vitals not documented were not able to be obtained and vitals that have been documented are patient reported.  VideoDeclined- This patient declined Librarian, academic. Therefore the visit was completed with audio only.  Persons Participating in Visit: Patient.  AWV Questionnaire: No: Patient Medicare AWV questionnaire was not completed prior to this visit.  Cardiac Risk Factors include: advanced age (>50men, >61 women);diabetes mellitus;dyslipidemia;male gender;hypertension     Objective:    Today's Vitals   04/18/24 1444  Weight: 216 lb (98 kg)  Height: 5' 11 (1.803 m)   Body mass index is 30.13 kg/m.     04/18/2024    3:02 PM 10/21/2022    2:35 PM 10/07/2022    4:35 PM 10/15/2021    2:18 PM 08/06/2021    4:17 PM 08/05/2021    6:49 AM 07/03/2020    7:49 AM  Advanced Directives  Does Patient Have a Medical Advance Directive? No Yes Yes Yes Yes Yes No  Type of Advance Directive  Living will Living will Healthcare Power of [place];Living will Healthcare Power of [place];Living will Healthcare Power of [place];Living will   Does  patient want to make changes to medical advance directive?  No - Patient declined No - Patient declined   No - Patient declined   Copy of Healthcare Power of Attorney in Chart?  No - copy requested  No - copy requested  No - copy requested   Would patient like information on creating a medical advance directive? Yes (MAU/Ambulatory/Procedural Areas - Information given)   No - Patient declined   No - Patient declined    Current Medications (verified) Outpatient Encounter Medications as of 04/18/2024  Medication Sig   allopurinol  (ZYLOPRIM ) 100 MG tablet TAKE 1 TABLET EVERY DAY (TOTAL 400MG /DAY)   allopurinol  (ZYLOPRIM ) 300 MG tablet TAKE 1 TABLET EVERY DAY (TOTAL 400MG /DAY)   lisinopril  (ZESTRIL ) 2.5 MG tablet Take 1 tablet (2.5 mg total) by mouth daily.   metoprolol  succinate (TOPROL -XL) 25 MG 24 hr tablet Take 1.5 tablets (37.5 mg total) by mouth daily.   rosuvastatin  (CRESTOR ) 10 MG tablet TAKE 1 TABLET DAILY.   No facility-administered encounter medications on file as of 04/18/2024.    Allergies (verified) Pravastatin  and Penicillins   History: Past Medical History:  Diagnosis Date   Anxiety    Arthritis    HANDS,  SHOULDERS   Bladder neck obstruction    BPH (benign prostatic hypertrophy)    Frequency of urination    H/O: gout    History of colon polyps    Hyperlipidemia    Nocturia    PAF (paroxysmal atrial fibrillation) (HCC)    Type 2 diabetes mellitus (HCC)    diet controlled  Urgency of urination    Wears hearing aid    BILATERAL   Past Surgical History:  Procedure Laterality Date   ATRIAL FIBRILLATION ABLATION N/A 08/05/2021   Procedure: ATRIAL FIBRILLATION ABLATION;  Surgeon: Inocencio Soyla Lunger, MD;  Location: [facility];  Service: Cardiovascular;  Laterality: N/A;   CARDIOVERSION N/A 10/26/2016   Procedure: CARDIOVERSION;  Surgeon: Ezra GORMAN Shuck, MD;  Location: [facility] END[facility]COPY;  Service: Cardiovascular;  Laterality: N/A;   CARDIOVERSION N/A 04/17/2019    Procedure: CARDIOVERSION;  Surgeon: Shlomo Wilbert SAUNDERS, MD;  Location: [facility] ENDOSCOPY;  Service: Cardiovascular;  Laterality: N/A;   COLONOSCOPY  08-30-2011, 2017   CYSTOSCOPY/URETEROSCOPY/HOLMIUM LASER/STENT PLACEMENT Left 10/07/2022   Procedure: CYSTOSCOPY/LEFT RETROGRADE PYLOGRAM/ URETEROSCOPY/HOLMIUM LASER/STENT PLACEMENT;  Surgeon: Selma Donnice SAUNDERS, MD;  Location: [facility];  Service: Urology;  Laterality: Left;   I & D KNEE WITH POLY EXCHANGE Left 10/08/2014   Procedure: IRRIGATION AND DEBRIDEMENT LEFT UNI KNEE ARTHOPLASTY  WITH POLY EXCHANGE;  Surgeon: Donnice JONETTA Car, MD;  Location: [facility];  Service: Orthopedics;  Laterality: Left;   KNEE ARTHROSCOPY Left 11/ 2013   PARTIAL KNEE ARTHROPLASTY Left 02/05/2013   Procedure: LEFT KNEE MEDIAL UNICOMPARTMENTAL KNEE;  Surgeon: Donnice JONETTA Car, MD;  Location: [facility];  Service: Orthopedics;  Laterality: Left;   PICC LINE INSERTION     and removed later   TRANSURETHRAL RESECTION OF PROSTATE N/A 09/30/2014   Procedure: TRANSURETHRAL RESECTION OF THE PROSTATE WITH GYRUS INSTRUMENTS;  Surgeon: Alm GORMAN Fragmin, MD;  Location: [facility];  Service: Urology;  Laterality: N/A;   TRANSURETHRAL RESECTION OF PROSTATE N/A 07/03/2020   Procedure: TRANSURETHRAL RESECTION OF THE PROSTATE (TURP);  Surgeon: Matilda Senior, MD;  Location: [facility];  Service: Urology;  Laterality: N/A;  1 HR   Family History  Adopted: Yes  Problem Relation Age of Onset   Colon cancer Father    Social History   Socioeconomic History   Marital status: Married    Spouse name: Not on file   Number of children: Not on file   Years of education: Not on file   Highest education level: Not on file  Occupational History   Not on file  Tobacco Use   Smoking status: Former    Current packs/day: 0.00    Average packs/day: 1.5 packs/day for 25.0 years (37.5 ttl pk-yrs)    Types: Cigarettes    Start date: 12/07/1973    Quit date: 12/08/1998    Years since  quitting: 25.3    Passive exposure: Past   Smokeless tobacco: Never   Tobacco comments:    Former smoker 09/02/21  Vaping Use   Vaping status: Never Used  Substance and Sexual Activity   Alcohol use: Yes    Alcohol/week: 1.0 standard drink of alcohol    Types: 1 Glasses of wine per week    Comment: RARE - a sip, occassional   Drug use: No   Sexual activity: Not Currently  Other Topics Concern   Not on file  Social History Narrative   Not on file   Social Drivers of Health   Financial Resource Strain: Low Risk  (04/18/2024)   Overall Financial Resource Strain (CARDIA)    Difficulty of Paying Living Expenses: Not hard at all  Food Insecurity: No Food Insecurity (04/18/2024)   Hunger Vital Sign    Worried About Running Out of Food in the Last Year: Never true    Ran Out of Food in the Last Year:  Never true  Transportation Needs: No Transportation Needs (04/18/2024)   PRAPARE - Administrator, Civil Service (Medical): No    Lack of Transportation (Non-Medical): No  Physical Activity: Sufficiently Active (04/18/2024)   Exercise Vital Sign    Days of Exercise per Week: 5 days    Minutes of Exercise per Session: 30 min  Stress: No Stress Concern Present (04/18/2024)   [employer] of Occupational Health - Occupational Stress Questionnaire    Feeling of Stress: Not at all  Social Connections: Socially Integrated (04/18/2024)   Social Connection and Isolation Panel    Frequency of Communication with Friends and Family: More than three times a week    Frequency of Social Gatherings with Friends and Family: Three times a week    Attends Religious Services: More than 4 times per year    Active Member of Clubs or Organizations: Yes    Attends Banker Meetings: More than 4 times per year    Marital Status: Married    Tobacco Counseling Counseling given: Not Answered Tobacco comments: Former smoker 09/02/21    Clinical Intake:  Pre-visit preparation  completed: Yes  Pain : No/denies pain  Diabetes: Yes CBG done?: No Did pt. bring in CBG monitor from home?: No  Lab Results  Component Value Date   HGBA1C 6.4 (H) 11/15/2023   HGBA1C 6.0 (H) 05/13/2022   HGBA1C 6.2 (H) 10/17/2020     How often do you need to have someone help you when you read instructions, pamphlets, or other written materials from your doctor or pharmacy?: 1 - Never  Interpreter Needed?: No  Information entered by :: Charmaine Bloodgood LPN   Activities of Daily Living     04/18/2024    2:47 PM 10/22/2023   10:49 AM  In your present state of health, do you have any difficulty performing the following activities:  Hearing? 0 0  Vision? 0 0  Difficulty concentrating or making decisions? 0 0  Walking or climbing stairs? 0 0  Dressing or bathing? 0 0  Doing errands, shopping? 0 0  Preparing Food and eating ? N N  Using the Toilet? N N  In the past six months, have you accidently leaked urine? N N  Do you have problems with loss of bowel control? N N  Managing your Medications? N N  Managing your Finances? N N  Housekeeping or managing your Housekeeping? N N    Patient Care Team: Duanne Butler DASEN, MD as PCP - General (Family Medicine) Nahser, Aleene PARAS, MD (Inactive) as PCP - Cardiology (Cardiology) Inocencio Soyla Lunger, MD as PCP - Electrophysiology (Cardiology) Selma Donnice SAUNDERS, MD as Consulting Physician (Urology) Gust Penna, MD as Consulting Physician (Ophthalmology) Dianna Specking, MD as Consulting Physician (Gastroenterology) Elner Arley LABOR, MD as Consulting Physician (Ophthalmology) [facility] [employer] [employer]  I have updated your Care Teams any recent Medical Services you may have received from other providers in the past year.     Assessment:   This is a routine wellness examination for [place].  Hearing/Vision screen Hearing Screening - Comments:: Wears hearing aids  Vision Screening - Comments:: up to  date with routine eye exams with [facility]    Goals Addressed             This Visit's Progress    Remain active and independent   On track      Depression Screen     04/18/2024  2:53 PM 11/15/2023    8:35 AM 10/21/2022    2:34 PM 05/18/2022    9:32 AM 10/15/2021    2:14 PM 01/23/2020   10:34 AM 11/07/2018    3:36 PM  PHQ 2/9 Scores  PHQ - 2 Score 0 0 0 0 0 0 1  PHQ- 9 Score      0     Fall Risk     04/18/2024    3:05 PM 11/15/2023    8:35 AM 10/22/2023   10:49 AM 10/21/2022    2:10 PM 05/18/2022    9:33 AM  Fall Risk   Falls in the past year? 0 0 1 0 0  Number falls in past yr: 0 0 0 0 0  Injury with Fall? 0 0 0 0 0  Risk for fall due to : No Fall Risks No Fall Risks   No Fall Risks  Follow up Falls prevention discussed;Education provided;Falls evaluation completed Falls prevention discussed;Falls evaluation completed  Falls prevention discussed;Education provided;Falls evaluation completed Falls prevention discussed      Data saved with a previous flowsheet row definition    MEDICARE RISK AT HOME:  Medicare Risk at Home Any stairs in or around the home?: No If so, are there any without handrails?: No Home free of loose throw rugs in walkways, pet beds, electrical cords, etc?: Yes Adequate lighting in your home to reduce risk of falls?: Yes Life alert?: No Use of a cane, walker or w/c?: No Grab bars in the bathroom?: Yes Shower chair or bench in shower?: No Elevated toilet seat or a handicapped toilet?: No  TIMED UP AND GO:  Was the test performed?  No  Cognitive Function: Declined/Normal: No cognitive concerns noted by patient or family. Patient alert, oriented, able to answer questions appropriately and recall recent events. No signs of memory loss or confusion.        10/21/2022    2:36 PM 10/15/2021    2:20 PM  6CIT Screen  What Year? 0 points 0 points  What month? 0 points 0 points  What time? 0 points 0 points  Count back from 20 0 points 0 points   Months in reverse 0 points 0 points  Repeat phrase 0 points 0 points  Total Score 0 points 0 points    Immunizations Immunization History  Administered Date(s) Administered   Fluad Quad(high Dose 65+) 05/10/2019, 06/24/2022   Influenza, High Dose Seasonal PF 06/15/2017, 07/06/2018   Influenza,inj,Quad PF,6+ Mos 06/19/2014, 07/28/2015, 06/18/2016   Influenza-Unspecified 07/04/2021   PFIZER(Purple Top)SARS-COV-2 Vaccination 10/25/2019, 11/15/2019, 05/19/2020   Pfizer Covid-19 Vaccine Bivalent Booster 10yrs & up 04/08/2021   Pneumococcal Conjugate-13 09/28/2013, 05/10/2019   Pneumococcal Polysaccharide-23 07/28/2015   Tdap 09/25/2006   Unspecified SARS-COV-2 Vaccination 06/24/2022   Zoster Recombinant(Shingrix ) 03/05/2021, 11/22/2023   Zoster, Live 05/09/2008    Screening Tests Health Maintenance  Topic Date Due   Diabetic kidney evaluation - Urine ACR  08/12/2019   OPHTHALMOLOGY EXAM  10/18/2019   FOOT EXAM  05/19/2023   COVID-19 Vaccine (6 - 2024-25 season) 05/22/2023   DTaP/Tdap/Td (2 - Td or Tdap) 11/14/2024 (Originally 09/25/2016)   INFLUENZA VACCINE  04/20/2024   HEMOGLOBIN A1C  05/14/2024   Diabetic kidney evaluation - eGFR measurement  11/14/2024   Medicare Annual Wellness (AWV)  04/18/2025   Pneumococcal Vaccine: 50+ Years  Completed   Hepatitis C Screening  Completed   Zoster Vaccines- Shingrix   Completed   Hepatitis B Vaccines  Aged Out  HPV VACCINES  Aged Out   Meningococcal B Vaccine  Aged Out   Colonoscopy  Discontinued    Health Maintenance  Health Maintenance Due  Topic Date Due   Diabetic kidney evaluation - Urine ACR  08/12/2019   OPHTHALMOLOGY EXAM  10/18/2019   FOOT EXAM  05/19/2023   COVID-19 Vaccine (6 - 2024-25 season) 05/22/2023   Health Maintenance Items Addressed: Requesting records for last diabetic eye exam    Additional Screening:  Vision Screening: Recommended annual ophthalmology exams for early detection of glaucoma and other  disorders of the eye. Would you like a referral to an eye doctor? No    Dental Screening: Recommended annual dental exams for proper oral hygiene  Community Resource Referral / Chronic Care Management: CRR required this visit?  No   CCM required this visit?  No   Plan:    I have personally reviewed and noted the following in the patient's chart:   Medical and social history Use of alcohol, tobacco or illicit drugs  Current medications and supplements including opioid prescriptions. Patient is not currently taking opioid prescriptions. Functional ability and status Nutritional status Physical activity Advanced directives List of other physicians Hospitalizations, surgeries, and ER visits in previous 12 months Vitals Screenings to include cognitive, depression, and falls Referrals and appointments  In addition, I have reviewed and discussed with patient certain preventive protocols, quality metrics, and best practice recommendations. A written personalized care plan for preventive services as well as general preventive health recommendations were provided to patient.   Lavelle Pfeiffer [place], [place]   2/[birth year]/7974   After Visit Summary: (MyChart) Due to this being a telephonic visit, the after visit summary with patients personalized plan was offered to patient via MyChart   Notes: See telephone note

## 2024-04-19 ENCOUNTER — Other Ambulatory Visit: Payer: Self-pay

## 2024-04-19 DIAGNOSIS — G8929 Other chronic pain: Secondary | ICD-10-CM

## 2024-05-18 ENCOUNTER — Ambulatory Visit: Admitting: Orthopedic Surgery

## 2024-06-05 ENCOUNTER — Ambulatory Visit (INDEPENDENT_AMBULATORY_CARE_PROVIDER_SITE_OTHER): Admitting: Family Medicine

## 2024-06-05 ENCOUNTER — Encounter: Payer: Self-pay | Admitting: Family Medicine

## 2024-06-05 VITALS — BP 138/82 | HR 59 | Temp 98.2°F | Ht 71.0 in | Wt 213.0 lb

## 2024-06-05 DIAGNOSIS — K5730 Diverticulosis of large intestine without perforation or abscess without bleeding: Secondary | ICD-10-CM | POA: Insufficient documentation

## 2024-06-05 DIAGNOSIS — R197 Diarrhea, unspecified: Secondary | ICD-10-CM | POA: Insufficient documentation

## 2024-06-05 DIAGNOSIS — J069 Acute upper respiratory infection, unspecified: Secondary | ICD-10-CM

## 2024-06-05 DIAGNOSIS — Z860100 Personal history of colon polyps, unspecified: Secondary | ICD-10-CM | POA: Insufficient documentation

## 2024-06-05 DIAGNOSIS — E7800 Pure hypercholesterolemia, unspecified: Secondary | ICD-10-CM | POA: Insufficient documentation

## 2024-06-05 DIAGNOSIS — K52832 Lymphocytic colitis: Secondary | ICD-10-CM | POA: Insufficient documentation

## 2024-06-05 DIAGNOSIS — K209 Esophagitis, unspecified without bleeding: Secondary | ICD-10-CM | POA: Insufficient documentation

## 2024-06-05 DIAGNOSIS — R194 Change in bowel habit: Secondary | ICD-10-CM | POA: Insufficient documentation

## 2024-06-05 DIAGNOSIS — Z7901 Long term (current) use of anticoagulants: Secondary | ICD-10-CM | POA: Insufficient documentation

## 2024-06-05 NOTE — Progress Notes (Signed)
 Subjective:    Patient ID: Jimmy Bradshaw, male    DOB: 02/18/1946, 78 y.o.   MRN: [id number]  Sore Throat     Patient reports 24-hour history of runny nose, sore scratchy throat, occasional cough.  He denies any fevers or chills.  He denies any sinus pain.  He denies any headache.  He denies any shortness of breath or chest pain. Past Medical History:  Diagnosis Date   Anxiety    Arthritis    HANDS,  SHOULDERS   Bladder neck obstruction    BPH (benign prostatic hypertrophy)    Frequency of urination    H/O: gout    History of colon polyps    Hyperlipidemia    Nocturia    PAF (paroxysmal atrial fibrillation) (HCC)    Type 2 diabetes mellitus (HCC)    diet controlled   Urgency of urination    Wears hearing aid    BILATERAL   Past Surgical History:  Procedure Laterality Date   ATRIAL FIBRILLATION ABLATION N/A 08/05/2021   Procedure: ATRIAL FIBRILLATION ABLATION;  Surgeon: Inocencio Soyla Lunger, MD;  Location: [facility];  Service: Cardiovascular;  Laterality: N/A;   CARDIOVERSION N/A 10/26/2016   Procedure: CARDIOVERSION;  Surgeon: Ezra GORMAN Shuck, MD;  Location: [facility] ENDOSCOPY;  Service: Cardiovascular;  Laterality: N/A;   CARDIOVERSION N/A 04/17/2019   Procedure: CARDIOVERSION;  Surgeon: Shlomo Wilbert SAUNDERS, MD;  Location: [facility] ENDOSCOPY;  Service: Cardiovascular;  Laterality: N/A;   COLONOSCOPY  08-30-2011, 2017   CYSTOSCOPY/URETEROSCOPY/HOLMIUM LASER/STENT PLACEMENT Left 10/07/2022   Procedure: CYSTOSCOPY/LEFT RETROGRADE PYLOGRAM/ URETEROSCOPY/HOLMIUM LASER/STENT PLACEMENT;  Surgeon: Selma Donnice SAUNDERS, MD;  Location: [facility];  Service: Urology;  Laterality: Left;   I & D KNEE WITH POLY EXCHANGE Left 10/08/2014   Procedure: IRRIGATION AND DEBRIDEMENT LEFT UNI KNEE ARTHOPLASTY  WITH POLY EXCHANGE;  Surgeon: Donnice JONETTA Car, MD;  Location: [facility];  Service: Orthopedics;  Laterality: Left;   KNEE ARTHROSCOPY Left 11/ 2013   PARTIAL KNEE ARTHROPLASTY Left 02/05/2013   Procedure: LEFT  KNEE MEDIAL UNICOMPARTMENTAL KNEE;  Surgeon: Donnice JONETTA Car, MD;  Location: [facility];  Service: Orthopedics;  Laterality: Left;   PICC LINE INSERTION     and removed later   TRANSURETHRAL RESECTION OF PROSTATE N/A 09/30/2014   Procedure: TRANSURETHRAL RESECTION OF THE PROSTATE WITH GYRUS INSTRUMENTS;  Surgeon: Alm GORMAN Fragmin, MD;  Location: [facility];  Service: Urology;  Laterality: N/A;   TRANSURETHRAL RESECTION OF PROSTATE N/A 07/03/2020   Procedure: TRANSURETHRAL RESECTION OF THE PROSTATE (TURP);  Surgeon: Matilda Senior, MD;  Location: [facility];  Service: Urology;  Laterality: N/A;  1 HR   Current Outpatient Medications on File Prior to Visit  Medication Sig Dispense Refill   allopurinol  (ZYLOPRIM ) 100 MG tablet TAKE 1 TABLET EVERY DAY (TOTAL 400MG /DAY) 90 tablet 3   allopurinol  (ZYLOPRIM ) 300 MG tablet TAKE 1 TABLET EVERY DAY (TOTAL 400MG /DAY) 90 tablet 3   lisinopril  (ZESTRIL ) 2.5 MG tablet Take 1 tablet (2.5 mg total) by mouth daily. 90 tablet 1   metoprolol  succinate (TOPROL -XL) 25 MG 24 hr tablet Take 1.5 tablets (37.5 mg total) by mouth daily. 135 tablet 1   rosuvastatin  (CRESTOR ) 10 MG tablet TAKE 1 TABLET DAILY. 90 tablet 1   No current facility-administered medications on file prior to visit.   Allergies  Allergen Reactions   Pravastatin  Other (See Comments)    Headache and legs hurt   Penicillins Rash    Reaction as a  child. Tolerated a penicillin shot w/o reaction 2018   Social History   Socioeconomic History   Marital status: Married    Spouse name: Not on file   Number of children: Not on file   Years of education: Not on file   Highest education level: Not on file  Occupational History   Not on file  Tobacco Use   Smoking status: Former    Current packs/day: 0.00    Average packs/day: 1.5 packs/day for 25.0 years (37.5 ttl pk-yrs)    Types: Cigarettes    Start date: 12/07/1973    Quit date: 12/08/1998    Years since  quitting: 25.5    Passive exposure: Past   Smokeless tobacco: Never   Tobacco comments:    Former smoker 09/02/21  Vaping Use   Vaping status: Never Used  Substance and Sexual Activity   Alcohol use: Yes    Alcohol/week: 1.0 standard drink of alcohol    Types: 1 Glasses of wine per week    Comment: RARE - a sip, occassional   Drug use: No   Sexual activity: Not Currently  Other Topics Concern   Not on file  Social History Narrative   Not on file   Social Drivers of Health   Financial Resource Strain: Low Risk  (04/18/2024)   Overall Financial Resource Strain (CARDIA)    Difficulty of Paying Living Expenses: Not hard at all  Food Insecurity: No Food Insecurity (04/18/2024)   Hunger Vital Sign    Worried About Running Out of Food in the Last Year: Never true    Ran Out of Food in the Last Year: Never true  Transportation Needs: No Transportation Needs (04/18/2024)   PRAPARE - Administrator, Civil Service (Medical): No    Lack of Transportation (Non-Medical): No  Physical Activity: Sufficiently Active (04/18/2024)   Exercise Vital Sign    Days of Exercise per Week: 5 days    Minutes of Exercise per Session: 30 min  Stress: No Stress Concern Present (04/18/2024)   [employer] of Occupational Health - Occupational Stress Questionnaire    Feeling of Stress: Not at all  Social Connections: Socially Integrated (04/18/2024)   Social Connection and Isolation Panel    Frequency of Communication with Friends and Family: More than three times a week    Frequency of Social Gatherings with Friends and Family: Three times a week    Attends Religious Services: More than 4 times per year    Active Member of Clubs or Organizations: Yes    Attends Banker Meetings: More than 4 times per year    Marital Status: Married  Catering manager Violence: Not At Risk (04/18/2024)   Humiliation, Afraid, Rape, and Kick questionnaire    Fear of Current or Ex-Partner: No     Emotionally Abused: No    Physically Abused: No    Sexually Abused: No     Review of Systems  All other systems reviewed and are negative.      Objective:   Physical Exam Constitutional:      Appearance: He is well-developed and normal weight. He is not ill-appearing, toxic-appearing or diaphoretic.  HENT:     Right Ear: Tympanic membrane and ear canal normal.     Left Ear: Tympanic membrane and ear canal normal.     Nose: Rhinorrhea present. No congestion.     Mouth/Throat:     Mouth: No oral lesions.     Pharynx: Oropharynx is  clear. No posterior oropharyngeal erythema.     Tonsils: No tonsillar exudate.  Cardiovascular:     Rate and Rhythm: Normal rate and regular rhythm.     Heart sounds: No murmur heard.    No gallop.  Pulmonary:     Effort: Pulmonary effort is normal. No respiratory distress.     Breath sounds: Normal breath sounds. No wheezing, rhonchi or rales.  Lymphadenopathy:     Cervical: No cervical adenopathy.  Neurological:     Mental Status: He is alert.           Assessment & Plan:   Viral upper respiratory tract infection Patient has a viral upper respiratory infection.  Recommended tincture of time supportive care.  No evidence of a serious bacterial infection today on exam.  Did recommend the patient go home and take a rapid COVID test to rule out COVID.

## 2024-06-06 ENCOUNTER — Other Ambulatory Visit: Payer: Self-pay | Admitting: Physician Assistant

## 2024-06-06 ENCOUNTER — Ambulatory Visit: Admitting: Orthopedic Surgery

## 2024-06-06 ENCOUNTER — Other Ambulatory Visit: Payer: Self-pay | Admitting: Family Medicine

## 2024-06-06 ENCOUNTER — Other Ambulatory Visit (INDEPENDENT_AMBULATORY_CARE_PROVIDER_SITE_OTHER): Payer: Self-pay

## 2024-06-06 ENCOUNTER — Encounter: Payer: Self-pay | Admitting: Orthopedic Surgery

## 2024-06-06 DIAGNOSIS — M25512 Pain in left shoulder: Secondary | ICD-10-CM | POA: Diagnosis not present

## 2024-06-06 DIAGNOSIS — E782 Mixed hyperlipidemia: Secondary | ICD-10-CM

## 2024-06-06 MED ORDER — METOPROLOL SUCCINATE ER 25 MG PO TB24: 37.5000 mg | ORAL_TABLET | Freq: Every day | ORAL | 0 refills | Status: DC

## 2024-06-06 NOTE — Progress Notes (Signed)
 Office Visit Note   Patient: Jimmy Bradshaw           Date of Birth: Jul 24, 1946           MRN: [id number] Visit Date: 06/06/2024 Requested by: Duanne Butler DASEN, MD [id number] [place],  [place] [address] PCP: Duanne Butler DASEN, MD  Subjective: Chief Complaint  Patient presents with   Left Shoulder - Pain    HPI: Jimmy Bradshaw is a 78 y.o. male who presents to the office reporting left shoulder pain.  He had an injury about 6 years ago treated with physical therapy with relief.  Now he reports recurrent pain in that left shoulder.  Describes decreased range of motion as well as relatively constant discomfort.  He is right-hand dominant.  The pain does not wake him from sleep at night.  No prior surgery on that left shoulder.  He has had multiple injections with about 1 week of relief done by primary care provider.  Last 1 was about 3 months ago.  Describes both pain and weakness in the left shoulder.  Occasionally the pain will radiate to the forearm..                ROS: All systems reviewed are negative as they relate to the chief complaint within the history of present illness.  Patient denies fevers or chills.  Assessment & Plan: Visit Diagnoses:  1. Left shoulder pain, unspecified chronicity     Plan: Impression is left shoulder pain with a little bit of crepitus on exam.  I think he may have some rotator cuff pathology going on.  No decrease in acromiohumeral distance on plain radiographs and no real arthritis in the glenohumeral joint.  He may do well with an intra-articular ultrasound-guided injection but lets scan his shoulder first to see exactly what were dealing with in terms of size and degree of pathology present.  Follow-Up Instructions: No follow-ups on file.   Orders:  Orders Placed This Encounter  Procedures   XR Shoulder Left   No orders of the defined types were placed in this encounter.     Procedures: No procedures performed   Clinical  Data: No additional findings.  Objective: Vital Signs: There were no vitals taken for this visit.  Physical Exam:  Constitutional: Patient appears well-developed HEENT:  Head: Normocephalic Eyes:EOM are normal Neck: Normal range of motion Cardiovascular: Normal rate Pulmonary/chest: Effort normal Neurologic: Patient is alert Skin: Skin is warm Psychiatric: Patient has normal mood and affect  Ortho Exam: Ortho exam demonstrates bilateral shoulder passive range of motion of 70/100/160.  Has good external rotation and subscap strength in both shoulders.  Does have some popping and coarseness with internal/external rotation of the shoulder at 90 degrees of abduction.  O'Brien's testing positive on the left negative on the right speeds testing positive on the left negative on the right.  No discrete AC joint tenderness left side versus right side.  Cervical spine range of motion intact.  Specialty Comments:  No specialty comments available.  Imaging: XR Shoulder Left Result Date: 06/06/2024 AP outlet axillary lateral radiographs left shoulder reviewed.  No acute fracture.  Shoulder is located.  Acromiohumeral distance is normal.  No significant degenerative changes in the glenohumeral or AC joint.  Visualized lung fields clear.     PMFS History: Patient Active Problem List   Diagnosis Date Noted   Change in bowel habits 06/05/2024   Diarrhea 06/05/2024   Diverticular  disease of colon 06/05/2024   Esophagitis 06/05/2024   History of colonic polyps 06/05/2024   Long term current use of anticoagulant therapy 06/05/2024   Lymphocytic colitis 06/05/2024   Pure hypercholesterolemia 06/05/2024   Aortic atherosclerosis (HCC) 12/01/2021   Atrial flutter (HCC) 04/03/2019   Chronic left shoulder pain 03/03/2018   Diabetic neuropathy (HCC) 03/04/2017   Epistaxis 08/30/2016   Perennial allergic rhinitis 08/30/2016   HTN (hypertension) 11/08/2014   PAF (paroxysmal atrial fibrillation)  (HCC) 11/06/2014   Knee pain, acute 10/08/2014   BPH with urinary obstruction 09/30/2014   Type 2 diabetes mellitus with diabetic neuropathy, unspecified (HCC) 10/16/2013   Back pain 06/08/2013   Obesity (BMI 30.0-34.9) 02/06/2013   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/05/2013   Hyperlipidemia    Anxiety    BPH (benign prostatic hyperplasia)    Colon polyps    Gout    Past Medical History:  Diagnosis Date   Anxiety    Arthritis    HANDS,  SHOULDERS   Bladder neck obstruction    BPH (benign prostatic hypertrophy)    Frequency of urination    H/O: gout    History of colon polyps    Hyperlipidemia    Nocturia    PAF (paroxysmal atrial fibrillation) (HCC)    Type 2 diabetes mellitus (HCC)    diet controlled   Urgency of urination    Wears hearing aid    BILATERAL    Family History  Adopted: Yes  Problem Relation Age of Onset   Colon cancer Father     Past Surgical History:  Procedure Laterality Date   ATRIAL FIBRILLATION ABLATION N/A 08/05/2021   Procedure: ATRIAL FIBRILLATION ABLATION;  Surgeon: Inocencio Soyla Lunger, MD;  Location: [facility];  Service: Cardiovascular;  Laterality: N/A;   CARDIOVERSION N/A 10/26/2016   Procedure: CARDIOVERSION;  Surgeon: Ezra GORMAN Shuck, MD;  Location: [facility] ENDOSCOPY;  Service: Cardiovascular;  Laterality: N/A;   CARDIOVERSION N/A 04/17/2019   Procedure: CARDIOVERSION;  Surgeon: Shlomo Wilbert SAUNDERS, MD;  Location: [facility] ENDOSCOPY;  Service: Cardiovascular;  Laterality: N/A;   COLONOSCOPY  08-30-2011, 2017   CYSTOSCOPY/URETEROSCOPY/HOLMIUM LASER/STENT PLACEMENT Left 10/07/2022   Procedure: CYSTOSCOPY/LEFT RETROGRADE PYLOGRAM/ URETEROSCOPY/HOLMIUM LASER/STENT PLACEMENT;  Surgeon: Selma Donnice SAUNDERS, MD;  Location: [facility];  Service: Urology;  Laterality: Left;   I & D KNEE WITH POLY EXCHANGE Left 10/08/2014   Procedure: IRRIGATION AND DEBRIDEMENT LEFT UNI KNEE ARTHOPLASTY  WITH POLY EXCHANGE;  Surgeon: Donnice JONETTA Car, MD;  Location: [facility];   Service: Orthopedics;  Laterality: Left;   KNEE ARTHROSCOPY Left 11/ 2013   PARTIAL KNEE ARTHROPLASTY Left 02/05/2013   Procedure: LEFT KNEE MEDIAL UNICOMPARTMENTAL KNEE;  Surgeon: Donnice JONETTA Car, MD;  Location: [facility];  Service: Orthopedics;  Laterality: Left;   PICC LINE INSERTION     and removed later   TRANSURETHRAL RESECTION OF PROSTATE N/A 09/30/2014   Procedure: TRANSURETHRAL RESECTION OF THE PROSTATE WITH GYRUS INSTRUMENTS;  Surgeon: Alm GORMAN Fragmin, MD;  Location: [facility];  Service: Urology;  Laterality: N/A;   TRANSURETHRAL RESECTION OF PROSTATE N/A 07/03/2020   Procedure: TRANSURETHRAL RESECTION OF THE PROSTATE (TURP);  Surgeon: Matilda Senior, MD;  Location: [facility];  Service: Urology;  Laterality: N/A;  1 HR   Social History   Occupational History   Not on file  Tobacco Use   Smoking status: Former    Current packs/day: 0.00    Average packs/day: 1.5 packs/day for 25.0 years (37.5  ttl pk-yrs)    Types: Cigarettes    Start date: 12/07/1973    Quit date: 12/08/1998    Years since quitting: 25.5    Passive exposure: Past   Smokeless tobacco: Never   Tobacco comments:    Former smoker 09/02/21  Vaping Use   Vaping status: Never Used  Substance and Sexual Activity   Alcohol use: Yes    Alcohol/week: 1.0 standard drink of alcohol    Types: 1 Glasses of wine per week    Comment: RARE - a sip, occassional   Drug use: No   Sexual activity: Not Currently

## 2024-06-19 ENCOUNTER — Encounter: Payer: Self-pay | Admitting: Orthopedic Surgery

## 2024-06-21 ENCOUNTER — Other Ambulatory Visit: Payer: Self-pay

## 2024-06-21 ENCOUNTER — Telehealth: Payer: Self-pay

## 2024-06-21 DIAGNOSIS — M25512 Pain in left shoulder: Secondary | ICD-10-CM

## 2024-06-21 NOTE — Telephone Encounter (Signed)
 Scan denied. P2P is an option. What would you like to do?

## 2024-06-21 NOTE — Telephone Encounter (Signed)
 Pt 1 x week 6 week then rov - pls tell him this directive is from his insurance not us  thx

## 2024-06-25 ENCOUNTER — Encounter: Payer: Self-pay | Admitting: Orthopedic Surgery

## 2024-06-27 ENCOUNTER — Other Ambulatory Visit

## 2024-07-09 ENCOUNTER — Other Ambulatory Visit: Payer: Self-pay | Admitting: Cardiology

## 2024-07-16 NOTE — Therapy (Signed)
 OUTPATIENT PHYSICAL THERAPY EVALUATION   Patient Name: Jimmy Bradshaw MRN: [id number] DOB:[birth year], 78 y.o., male Today's Date: 07/17/2024  END OF SESSION:  PT End of Session - 07/17/24 1257     Visit Number 1    Number of Visits 20    Date for Recertification  09/25/24    Authorization Type [employer] $10 copay, no VL    Progress Note Due on Visit 10    PT Start Time 1304    PT Stop Time 1340    PT Time Calculation (min) 36 min    Activity Tolerance Patient tolerated treatment well    Behavior During Therapy WFL for tasks assessed/performed          Past Medical History:  Diagnosis Date   Anxiety    Arthritis    HANDS,  SHOULDERS   Bladder neck obstruction    BPH (benign prostatic hypertrophy)    Frequency of urination    H/O: gout    History of colon polyps    Hyperlipidemia    Nocturia    PAF (paroxysmal atrial fibrillation) (HCC)    Type 2 diabetes mellitus (HCC)    diet controlled   Urgency of urination    Wears hearing aid    BILATERAL   Past Surgical History:  Procedure Laterality Date   ATRIAL FIBRILLATION ABLATION N/A 08/05/2021   Procedure: ATRIAL FIBRILLATION ABLATION;  Surgeon: Inocencio Soyla Lunger, MD;  Location: [facility];  Service: Cardiovascular;  Laterality: N/A;   CARDIOVERSION N/A 10/26/2016   Procedure: CARDIOVERSION;  Surgeon: Ezra GORMAN Shuck, MD;  Location: [facility] ENDOSCOPY;  Service: Cardiovascular;  Laterality: N/A;   CARDIOVERSION N/A 04/17/2019   Procedure: CARDIOVERSION;  Surgeon: Shlomo Wilbert SAUNDERS, MD;  Location: [facility] ENDOSCOPY;  Service: Cardiovascular;  Laterality: N/A;   COLONOSCOPY  08-30-2011, 2017   CYSTOSCOPY/URETEROSCOPY/HOLMIUM LASER/STENT PLACEMENT Left 10/07/2022   Procedure: CYSTOSCOPY/LEFT RETROGRADE PYLOGRAM/ URETEROSCOPY/HOLMIUM LASER/STENT PLACEMENT;  Surgeon: Selma Donnice SAUNDERS, MD;  Location: [facility];  Service: Urology;  Laterality: Left;   I & D KNEE WITH POLY EXCHANGE Left 10/08/2014   Procedure: IRRIGATION AND  DEBRIDEMENT LEFT UNI KNEE ARTHOPLASTY  WITH POLY EXCHANGE;  Surgeon: Donnice JONETTA Car, MD;  Location: [facility];  Service: Orthopedics;  Laterality: Left;   KNEE ARTHROSCOPY Left 11/ 2013   PARTIAL KNEE ARTHROPLASTY Left 02/05/2013   Procedure: LEFT KNEE MEDIAL UNICOMPARTMENTAL KNEE;  Surgeon: Donnice JONETTA Car, MD;  Location: [facility];  Service: Orthopedics;  Laterality: Left;   PICC LINE INSERTION     and removed later   TRANSURETHRAL RESECTION OF PROSTATE N/A 09/30/2014   Procedure: TRANSURETHRAL RESECTION OF THE PROSTATE WITH GYRUS INSTRUMENTS;  Surgeon: Alm GORMAN Fragmin, MD;  Location: [facility];  Service: Urology;  Laterality: N/A;   TRANSURETHRAL RESECTION OF PROSTATE N/A 07/03/2020   Procedure: TRANSURETHRAL RESECTION OF THE PROSTATE (TURP);  Surgeon: Matilda Senior, MD;  Location: [facility];  Service: Urology;  Laterality: N/A;  1 HR   Patient Active Problem List   Diagnosis Date Noted   Change in bowel habits 06/05/2024   Diarrhea 06/05/2024   Diverticular disease of colon 06/05/2024   Esophagitis 06/05/2024   History of colonic polyps 06/05/2024   Long term current use of anticoagulant therapy 06/05/2024   Lymphocytic colitis 06/05/2024   Pure hypercholesterolemia 06/05/2024   Aortic atherosclerosis 12/01/2021   Atrial flutter (HCC) 04/03/2019   Chronic left shoulder pain 03/03/2018   Diabetic neuropathy (HCC) 03/04/2017   Epistaxis 08/30/2016  Perennial allergic rhinitis 08/30/2016   HTN (hypertension) 11/08/2014   PAF (paroxysmal atrial fibrillation) (HCC) 11/06/2014   Knee pain, acute 10/08/2014   BPH with urinary obstruction 09/30/2014   Type 2 diabetes mellitus with diabetic neuropathy, unspecified (HCC) 10/16/2013   Back pain 06/08/2013   Obesity (BMI 30.0-34.9) 02/06/2013   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/05/2013   Hyperlipidemia    Anxiety    BPH (benign prostatic hyperplasia)    Colon polyps    Gout     PCP: Duanne Lowers MD  REFERRING PROVIDER: Addie Cordella Hamilton, MD  REFERRING DIAG: [phone] (ICD-10-CM) - Left shoulder pain, unspecified chronicity  THERAPY DIAG:  Chronic left shoulder pain  Stiffness of left shoulder, not elsewhere classified  Muscle weakness (generalized)  Abnormal posture  Rationale for Evaluation and Treatment: Rehabilitation  ONSET DATE: Chronic worsening, worse over last few years.   SUBJECTIVE:                                                                                                                                                                                      SUBJECTIVE STATEMENT: Pt indicated having initiation of symptoms after fall catch in kitchen approx. 15 years ago.  Reported some improvement over time but has had worsening of symptoms recently.  Pt indicated complaints complaints from Lt shoulder into Lt forearm at times.  Pt indicated use of Lt arm in lifting, carrying.  Pressure on shoulder in back hurts.  Pt indicated disruption of sleeping when pressure on Lt arm.   Rt hand dominant   PERTINENT HISTORY: Medical History: Anxiety, arhtritis, Hyperlipidemia, DM2  PAIN:  NPRS scale: at worst 6 /10, at best 0/10.  Pain location: Lt shoulder  Pain description: bothersome, just hurts.  Denied numbness/tingling.   Aggravating factors: reaching, lifting, reaching behind, lying on Lt side.  Relieving factors: medicine at times.   PRECAUTIONS: None  WEIGHT BEARING RESTRICTIONS: No  FALLS:  Has patient fallen in last 6 months? No  LIVING ENVIRONMENT: Lives in: House/apartment  OCCUPATION: Retired  PLOF: Independent, caregvier for wife, agility with dog, pickleball.  Doesn't mow yard.   PATIENT GOALS:Reduce pain, get back to activity    OBJECTIVE:    PATIENT SURVEYS:  Patient-Specific Activity Scoring Scheme  0 represents "unable to perform." 10 represents "able to perform at prior level. 0 1 2 3 4 5 6 7 8 9  10 (Date and  Score)   Activity Eval  07/17/2024    1. Lifting more than 5 lbs   1    2. Reaching behind back 0     3. Using the Lt in pickleball 2   4.  Sleeping 5   5.    Score 2    Total score = sum of the activity scores/number of activities Minimum detectable change (90%CI) for average score = 2 points Minimum detectable change (90%CI) for single activity score = 3 points  COGNITION: 07/17/2024 Overall cognitive status: WFL     SENSATION: 07/17/2024 WFL  POSTURE: 07/17/2024 Rounded shoulders, forward head posture  UPPER EXTREMITY ROM:   ROM Right Eval 07/17/2024 Left Eval 07/17/2024  Shoulder flexion  160 AROM in supine  Painful arc noted against gravity   Shoulder extension    Shoulder abduction    Shoulder adduction    Shoulder internal rotation  70 AROM in 45 deg abduction  Shoulder external rotation  65 AROM in 45 deg abduction  Elbow flexion    Elbow extension    Wrist flexion    Wrist extension            Hand behind back  L1 L1 with pain      (Blank rows = not tested)  UPPER EXTREMITY MMT:  MMT Right Eval 07/17/2024 Left Eval 07/17/2024  Shoulder flexion 5/5 4/5 c mild pain  Shoulder extension    Shoulder abduction 5/5 4/5 c mild pain  Shoulder adduction    Shoulder internal rotation 5/5 5/5  Shoulder external rotation 5/5 4+/5  Middle trapezius    Lower trapezius    Elbow flexion    Elbow extension    Wrist flexion    Wrist extension    Wrist ulnar deviation    Wrist radial deviation    Wrist pronation    Wrist supination    Grip strength (lbs)    (Blank rows = not tested)  SHOULDER SPECIAL TESTS: 07/17/2024 Lt painful arc +, empty can + for pain but not weakness.  (-) Drop arm on Lt  JOINT MOBILITY TESTING:  07/17/2024 WFL in mid range  PALPATION:  07/17/2024 Trigger points noted in Lt infraspinatus with concordant symptoms.                                                                                                                                                                                                    TODAY'S TREATMENT:  DATE: 07/17/2024 Therex:    HEP instruction/performance c cues for techniques, handout provided.  Trial set performed of each for comprehension and symptom assessment.  See below for exercise list  Manual Percussive device to Lt infraspinatus.  Compression to same.  (IR mobility improved after manual, reduced painful arc)   PATIENT EDUCATION: 07/17/2024 Education details: HEP, POC Person educated: Patient Education method: Programmer, Multimedia, Demonstration, Verbal cues, and Handouts Education comprehension: verbalized understanding, returned demonstration, and verbal cues required  HOME EXERCISE PROGRAM: Access Code: [email or web address] URL: [email or web address] Date: 07/17/2024 Prepared by: Ozell Silvan  Exercises - Standing Shoulder Posterior Capsule Stretch  - 2 x daily - 7 x weekly - 1 sets - 5 reps - 15 hold - Sleeper Stretch  - 2 x daily - 7 x weekly - 1 sets - 5 reps - 15 hold - Standing Bilateral Low Shoulder Row with Anchored Resistance  - 1-2 x daily - 7 x weekly - 2-3 sets - 10-15 reps - Shoulder Extension with Resistance  - 1-2 x daily - 7 x weekly - 1-2 sets - 10-15 reps - Shoulder External Rotation Reactive Isometrics  - 1-2 x daily - 7 x weekly - 1 sets - 10-15 reps - 5 hold  ASSESSMENT:  CLINICAL IMPRESSION: Patient is a 78 y.o. who comes to clinic with complaints of Lt shoulder pain with mobility, strength and movement coordination deficits that impair their ability to perform usual daily and recreational functional activities without increase difficulty/symptoms at this time.  Patient to benefit from skilled PT services to address impairments and limitations to improve to previous level of function without restriction secondary to condition.   OBJECTIVE  IMPAIRMENTS: decreased activity tolerance, decreased mobility, difficulty walking, decreased ROM, decreased strength, hypomobility, increased fascial restrictions, impaired perceived functional ability, increased muscle spasms, impaired flexibility, improper body mechanics, postural dysfunction, and pain.   ACTIVITY LIMITATIONS: carrying, lifting, sleeping, bathing, toileting, dressing, reach over head, hygiene/grooming, and caring for others  PARTICIPATION LIMITATIONS: meal prep, cleaning, laundry, interpersonal relationship, shopping, and community activity  PERSONAL FACTORS: Time since onset of injury/illness/exacerbation, Anxiety, arhtritis, Hyperlipidemia, DM2 are also affecting patient's functional outcome.   REHAB POTENTIAL: Good  CLINICAL DECISION MAKING: Stable/uncomplicated  EVALUATION COMPLEXITY: Low   GOALS: Goals reviewed with patient? Yes  SHORT TERM GOALS: (target date for Short term goals are 3 weeks 08/07/2024)  1.Patient will demonstrate independent use of home exercise program to maintain progress from in clinic treatments. Goal status: New  LONG TERM GOALS: (target dates for all long term goals are 10 weeks  09/25/2024 )   1. Patient will demonstrate/report pain at worst less than or equal to 2/10 to facilitate minimal limitation in daily activity secondary to pain symptoms. Goal status: New   2. Patient will demonstrate independent use of home exercise program to facilitate ability to maintain/progress functional gains from skilled physical therapy services. Goal status: New   3. Patient will demonstrate Patient specific functional scale avg > or = 8/10 to indicate reduced disability due to condition.  Goal status: New   4.  Patient will demonstrate Lt  UE MMT 5/5 throughout to facilitate lifting, reaching, carrying at [facility] in daily activity.   Goal status: New   5.  Patient will demonstrate Lt  [facility] joint AROM WFL s symptoms to facilitate usual overhead  reaching, self care, dressing at PLOF.    Goal status: New     PLAN:  PT FREQUENCY: 1-2x/week  PT DURATION: 10 weeks  PLANNED INTERVENTIONS: Can include  02853- PT Re-evaluation, 97110-Therapeutic exercises, 97530- Therapeutic activity, [id number]- Neuromuscular re-education, [phone]- Self Care, 02859- Manual therapy, [phone]- Gait training, [phone]- Orthotic Fit/training, [phone]- Canalith repositioning, [id number]- Aquatic Therapy, [phone]- Electrical stimulation (unattended), [id number] Physical performance testing, [id number]- Vasopneumatic device, [id number]- Ultrasound, [id number]- Traction (mechanical), [id number]- Ionotophoresis 4mg /ml Dexamethasone ,  79439 - Needle insertion w/o injection 1 or 2 muscles, 20561 - Needle insertion w/o injection 3 or more muscles.   Patient/Family education, Balance training, Stair training, Taping, Dry Needling, Joint mobilization, Joint manipulation, Spinal manipulation, Spinal mobilization, Scar mobilization, Vestibular training, Visual/preceptual remediation/compensation, DME instructions, Cryotherapy, and Moist heat.  All performed as medically necessary.  All included unless contraindicated  PLAN FOR NEXT SESSION: Review HEP knowledge/results.   Infraspinatus trigger point release.    Ozell Silvan, PT, DPT, OCS, ATC 07/17/24  2:05 PM

## 2024-07-17 ENCOUNTER — Encounter: Payer: Self-pay | Admitting: Rehabilitative and Restorative Service Providers"

## 2024-07-17 ENCOUNTER — Ambulatory Visit: Admitting: Rehabilitative and Restorative Service Providers"

## 2024-07-17 DIAGNOSIS — M25612 Stiffness of left shoulder, not elsewhere classified: Secondary | ICD-10-CM

## 2024-07-17 DIAGNOSIS — M6281 Muscle weakness (generalized): Secondary | ICD-10-CM

## 2024-07-17 DIAGNOSIS — M25512 Pain in left shoulder: Secondary | ICD-10-CM

## 2024-07-17 DIAGNOSIS — R293 Abnormal posture: Secondary | ICD-10-CM | POA: Diagnosis not present

## 2024-07-17 DIAGNOSIS — G8929 Other chronic pain: Secondary | ICD-10-CM | POA: Diagnosis not present

## 2024-07-31 ENCOUNTER — Ambulatory Visit: Admitting: Rehabilitative and Restorative Service Providers"

## 2024-07-31 ENCOUNTER — Encounter: Payer: Self-pay | Admitting: Rehabilitative and Restorative Service Providers"

## 2024-07-31 DIAGNOSIS — G8929 Other chronic pain: Secondary | ICD-10-CM | POA: Diagnosis not present

## 2024-07-31 DIAGNOSIS — M25512 Pain in left shoulder: Secondary | ICD-10-CM | POA: Diagnosis not present

## 2024-07-31 DIAGNOSIS — M6281 Muscle weakness (generalized): Secondary | ICD-10-CM

## 2024-07-31 DIAGNOSIS — R293 Abnormal posture: Secondary | ICD-10-CM

## 2024-07-31 DIAGNOSIS — M25612 Stiffness of left shoulder, not elsewhere classified: Secondary | ICD-10-CM

## 2024-07-31 NOTE — Therapy (Addendum)
 " OUTPATIENT PHYSICAL THERAPY TREATMENT / DISCHARGE   Patient Name: Jimmy Bradshaw MRN: [id number] DOB:1946/01/30, 78 y.o., male Today's Date: 07/31/2024  END OF SESSION:  PT End of Session - 07/31/24 1436     Visit Number 2    Number of Visits 20    Date for Recertification  09/25/24    Authorization Type [employer] $10 copay, no VL    Progress Note Due on Visit 10    PT Start Time 1430    PT Stop Time 1510    PT Time Calculation (min) 40 min    Activity Tolerance Patient tolerated treatment well    Behavior During Therapy WFL for tasks assessed/performed           Past Medical History:  Diagnosis Date   Anxiety    Arthritis    HANDS,  SHOULDERS   Bladder neck obstruction    BPH (benign prostatic hypertrophy)    Frequency of urination    H/O: gout    History of colon polyps    Hyperlipidemia    Nocturia    PAF (paroxysmal atrial fibrillation) (HCC)    Type 2 diabetes mellitus (HCC)    diet controlled   Urgency of urination    Wears hearing aid    BILATERAL   Past Surgical History:  Procedure Laterality Date   ATRIAL FIBRILLATION ABLATION N/A 08/05/2021   Procedure: ATRIAL FIBRILLATION ABLATION;  Surgeon: Inocencio Soyla Lunger, MD;  Location: [facility];  Service: Cardiovascular;  Laterality: N/A;   CARDIOVERSION N/A 10/26/2016   Procedure: CARDIOVERSION;  Surgeon: Ezra GORMAN Shuck, MD;  Location: [facility] ENDOSCOPY;  Service: Cardiovascular;  Laterality: N/A;   CARDIOVERSION N/A 04/17/2019   Procedure: CARDIOVERSION;  Surgeon: Shlomo Wilbert SAUNDERS, MD;  Location: [facility] ENDOSCOPY;  Service: Cardiovascular;  Laterality: N/A;   COLONOSCOPY  08-30-2011, 2017   CYSTOSCOPY/URETEROSCOPY/HOLMIUM LASER/STENT PLACEMENT Left 10/07/2022   Procedure: CYSTOSCOPY/LEFT RETROGRADE PYLOGRAM/ URETEROSCOPY/HOLMIUM LASER/STENT PLACEMENT;  Surgeon: Selma Donnice SAUNDERS, MD;  Location: [facility] ORS;  Service: Urology;  Laterality: Left;   I & D KNEE WITH POLY EXCHANGE Left 10/08/2014   Procedure:  IRRIGATION AND DEBRIDEMENT LEFT UNI KNEE ARTHOPLASTY  WITH POLY EXCHANGE;  Surgeon: Donnice JONETTA Car, MD;  Location: [facility] ORS;  Service: Orthopedics;  Laterality: Left;   KNEE ARTHROSCOPY Left 11/ 2013   PARTIAL KNEE ARTHROPLASTY Left 02/05/2013   Procedure: LEFT KNEE MEDIAL UNICOMPARTMENTAL KNEE;  Surgeon: Donnice JONETTA Car, MD;  Location: [facility] ORS;  Service: Orthopedics;  Laterality: Left;   PICC LINE INSERTION     and removed later   TRANSURETHRAL RESECTION OF PROSTATE N/A 09/30/2014   Procedure: TRANSURETHRAL RESECTION OF THE PROSTATE WITH GYRUS INSTRUMENTS;  Surgeon: Alm GORMAN Fragmin, MD;  Location: 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   Esophagitis 06/05/2024   History of colonic polyps 06/05/2024   Long term current use of anticoagulant therapy 06/05/2024   Lymphocytic colitis 06/05/2024   Pure hypercholesterolemia 06/05/2024   Aortic atherosclerosis 12/01/2021   Atrial flutter (HCC) 04/03/2019   Chronic left shoulder pain 03/03/2018   Diabetic neuropathy (HCC) 03/04/2017  Epistaxis 08/30/2016   Perennial allergic rhinitis 08/30/2016   HTN (hypertension) 11/08/2014   PAF (paroxysmal atrial fibrillation) (HCC) 11/06/2014   Knee pain, acute 10/08/2014   BPH with urinary obstruction 09/30/2014   Type 2 diabetes mellitus with diabetic neuropathy, unspecified (HCC) 10/16/2013   Back pain 06/08/2013   Obesity (BMI 30.0-34.9) 02/06/2013   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/05/2013   Hyperlipidemia    Anxiety    BPH (benign prostatic hyperplasia)    Colon polyps    Gout      PCP: Duanne Lowers MD  REFERRING PROVIDER: Addie Cordella Hamilton, MD  REFERRING DIAG: [phone] (ICD-10-CM) - Left shoulder pain, unspecified chronicity  THERAPY DIAG:  Chronic left shoulder pain  Stiffness of left shoulder, not elsewhere classified  Muscle weakness (generalized)  Abnormal posture  Rationale for Evaluation and Treatment: Rehabilitation  ONSET DATE: Chronic worsening, worse over last few years.   SUBJECTIVE:                                                                                                                                                                                      SUBJECTIVE STATEMENT: Pt indicated having some complaints noted with resistance band exercise.    Rt hand dominant   PERTINENT HISTORY: Medical History: Anxiety, arhtritis, Hyperlipidemia, DM2  PAIN:  NPRS scale: current upon arrival 0/10.  At worst in last 1-2 days:  8/10.  Pain location: Lt shoulder  Pain description: bothersome, just hurts.  Denied numbness/tingling.   Aggravating factors: reaching, lifting, reaching behind, lying on Lt side.  Relieving factors: medicine at times.   PRECAUTIONS: None  WEIGHT BEARING RESTRICTIONS: No  FALLS:  Has patient fallen in last 6 months? No  LIVING ENVIRONMENT: Lives in: House/apartment  OCCUPATION: Retired  PLOF: Independent, caregvier for wife, agility with dog, pickleball.  Doesn't mow yard.   PATIENT GOALS:Reduce pain, get back to activity    OBJECTIVE:    PATIENT SURVEYS:  Patient-Specific Activity Scoring Scheme  0 represents unable to perform. 10 represents able to perform at prior level. 0 1 2 3 4 5 6 7 8 9  10 (Date and Score)   Activity Eval  07/17/2024    1. Lifting more than 5 lbs   1    2. Reaching behind back 0     3. Using the Lt in pickleball 2   4. Sleeping 5   5.    Score 2    Total score = sum of the activity scores/number of activities Minimum detectable change (90%CI)  for average score = 2 points Minimum detectable change (90%CI) for single activity score = 3 points  COGNITION: 07/17/2024 Overall  cognitive status: WFL     SENSATION: 07/17/2024 WFL  POSTURE: 07/17/2024 Rounded shoulders, forward head posture  UPPER EXTREMITY ROM:   ROM Right Eval 07/17/2024 Left Eval 07/17/2024 Left 07/31/2024  Shoulder flexion  160 AROM in supine  Painful arc noted against gravity    Shoulder extension     Shoulder abduction     Shoulder adduction     Shoulder internal rotation  70 AROM in 45 deg abduction 80 AROM in 45 deg abduction  Shoulder external rotation  65 AROM in 45 deg abduction 65 AROM in 45 deg abduction  Elbow flexion     Elbow extension     Wrist flexion     Wrist extension               Hand behind back  L1 L1 with pain        (Blank rows = not tested)  UPPER EXTREMITY MMT:  MMT Right Eval 07/17/2024 Left Eval 07/17/2024  Shoulder flexion 5/5 4/5 c mild pain  Shoulder extension    Shoulder abduction 5/5 4/5 c mild pain  Shoulder adduction    Shoulder internal rotation 5/5 5/5  Shoulder external rotation 5/5 4+/5  Middle trapezius    Lower trapezius    Elbow flexion    Elbow extension    Wrist flexion    Wrist extension    Wrist ulnar deviation    Wrist radial deviation    Wrist pronation    Wrist supination    Grip strength (lbs)    (Blank rows = not tested)  SHOULDER SPECIAL TESTS: 07/31/2024: Lt painful arc +, drop arm Lt.   07/17/2024 Lt painful arc +, empty can + for pain but not weakness.  (-) Drop arm on Lt  JOINT MOBILITY TESTING:  07/17/2024 WFL in mid range  PALPATION:  07/17/2024 Trigger points noted in Lt infraspinatus with concordant symptoms.                                                                                                                                                                                                   TODAY'S TREATMENT:  DATE: 07/31/2024 Manual: Lt shoulder inferior glides g3, posterior glides with mobilization c movement passive ER Lt shoulder Percussive device to Lt infrapsinatus   Therex: Supine Lt shoulder flexion AROM with 1 lb weight x 10 Sidelying Lt shoulder abduction AROM with 1 lb weight x 10 Sidelying Lt shoulder ER with towel under arm 1 lb weight x 20 Cues for HEP adjustments with verbal cues given for adjustments pending pain response.  Detailed education given about muscle pathology involvement and how that may impact pain in active movements.      TODAY'S TREATMENT:                                                                                                       DATE: 07/17/2024 Therex:    HEP instruction/performance c cues for techniques, handout provided.  Trial set performed of each for comprehension and symptom assessment.  See below for exercise list  Manual Percussive device to Lt infraspinatus.  Compression to same.  (IR mobility improved after manual, reduced painful arc)   PATIENT EDUCATION: 07/17/2024 Education details: HEP, POC Person educated: Patient Education method: Programmer, Multimedia, Demonstration, Verbal cues, and Handouts Education comprehension: verbalized understanding, returned demonstration, and verbal cues required  HOME EXERCISE PROGRAM: Access Code: [email or web address] URL: [email or web address] Date: 07/31/2024 Prepared by: Ozell Silvan  Exercises - Standing Shoulder Posterior Capsule Stretch  - 2 x daily - 7 x weekly - 1 sets - 5 reps - 15 hold - Sleeper Stretch  - 2 x daily - 7 x weekly - 1 sets - 5 reps - 15 hold - Supine Shoulder Flexion Extension Full Range AROM (Mirrored)  - 1-2 x daily - 7 x weekly - 1-2 sets - 10-20 reps - Sidelying Shoulder Abduction Palm Forward (Mirrored)  - 1-2 x daily - 7 x weekly - 2-3 sets - 10-15 reps - Sidelying Shoulder External Rotation  - 1-2 x  daily - 7 x weekly - 2-3 sets - 10-15 reps  ASSESSMENT:  CLINICAL IMPRESSION: Symptoms continued to be similar overall in daily activity, with worsened symptoms with elevation and caregiver mobility assistance activity.  Reported rows and gh ext pain at home (not noted in clinic on first day).  Due to symptoms, held those activity.  Shifted focus on elevation and ER strengthening/control improvements with focus on pain response.    Will reassess response.  MRI was ordered but not performed.  Questions still remain about specific muscle pathology and amount.   OBJECTIVE IMPAIRMENTS: decreased activity tolerance, decreased mobility, difficulty walking, decreased ROM, decreased strength, hypomobility, increased fascial restrictions, impaired perceived functional ability, increased muscle spasms, impaired flexibility, improper body mechanics, postural dysfunction, and pain.   ACTIVITY LIMITATIONS: carrying, lifting, sleeping, bathing, toileting, dressing, reach over head, hygiene/grooming, and caring for others  PARTICIPATION LIMITATIONS: meal prep, cleaning, laundry, interpersonal relationship, shopping, and community activity  PERSONAL FACTORS: Time since onset of injury/illness/exacerbation, Anxiety, arhtritis, Hyperlipidemia, DM2 are also affecting patient's functional outcome.   REHAB POTENTIAL: Good  CLINICAL DECISION MAKING: Stable/uncomplicated  EVALUATION COMPLEXITY: Low   GOALS: Goals reviewed with patient? Yes  SHORT TERM GOALS: (target date for Short term goals are 3 weeks 08/07/2024)  1.Patient will demonstrate independent use of home exercise program to maintain progress from in clinic treatments. Goal status: on going 07/31/2024  LONG TERM GOALS: (target dates for all long term goals are 10 weeks  09/25/2024 )   1. Patient will demonstrate/report pain at worst less than or equal to 2/10 to facilitate minimal limitation in daily activity secondary to pain symptoms. Goal  status: on going 07/31/2024   2. Patient will demonstrate independent use of home exercise program to facilitate ability to maintain/progress functional gains from skilled physical therapy services. Goal status: on going 07/31/2024   3. Patient will demonstrate Patient specific functional scale avg > or = 8/10 to indicate reduced disability due to condition.  Goal status: on going 07/31/2024   4.  Patient will demonstrate Lt  UE MMT 5/5 throughout to facilitate lifting, reaching, carrying at [facility] in daily activity.   Goal status: on going 07/31/2024   5.  Patient will demonstrate Lt  [facility] joint AROM WFL s symptoms to facilitate usual overhead reaching, self care, dressing at PLOF.    Goal status: on going 07/31/2024     PLAN:  PT FREQUENCY: 1-2x/week  PT DURATION: 10 weeks  PLANNED INTERVENTIONS: Can include 02853- PT Re-evaluation, 97110-Therapeutic exercises, 97530- Therapeutic activity, 97112- Neuromuscular re-education, 97535- Self Care, 97140- Manual therapy, [id number]- Gait training, [phone]- Orthotic Fit/training, [phone]- Canalith repositioning, [id number]- Aquatic Therapy, [phone]- Electrical stimulation (unattended), [id number] Physical performance testing, 97016- Vasopneumatic device, [id number]- Ultrasound, [id number]- Traction (mechanical), [id number]- Ionotophoresis 4mg /ml Dexamethasone ,  [id number] - Needle insertion w/o injection 1 or 2 muscles, 20561 - Needle insertion w/o injection 3 or more muscles.   Patient/Family education, Balance training, Stair training, Taping, Dry Needling, Joint mobilization, Joint manipulation, Spinal manipulation, Spinal mobilization, Scar mobilization, Vestibular training, Visual/preceptual remediation/compensation, DME instructions, Cryotherapy, and Moist heat.  All performed as medically necessary.  All included unless contraindicated  PLAN FOR NEXT SESSION: Recheck of HEP adjustment.   Manual therapy as necessary.     Ozell Silvan, PT, DPT, OCS, ATC 07/31/24  3:14  PM  PHYSICAL THERAPY DISCHARGE SUMMARY  Visits from Start of Care: 2  Current functional level related to goals / functional outcomes: See note   Remaining deficits: See note   Education / Equipment: HEP  Patient goals were partially met. Patient is being discharged due to not returning since the last visit. Ozell Silvan, PT, DPT, OCS, ATC 10/17/24  3:24 PM     "

## 2024-08-06 ENCOUNTER — Encounter: Admitting: Rehabilitative and Restorative Service Providers"

## 2024-09-03 NOTE — Progress Notes (Unsigned)
 Electrophysiology Office Note:   Date:  09/04/2024  ID:  CHRIS CRIPPS, DOB 05/27/1946, MRN [id number]  Primary Cardiologist: Aleene Passe, MD (Inactive) Primary Heart Failure: None Electrophysiologist: Will Gladis Norton, MD      History of Present Illness:   JOUSHA SCHWANDT is a 78 y.o. male with h/o AF, AFL, HTN, HLD, aortic atherosclerosis, former smoker, DM II, BPH, obesity seen today for routine electrophysiology followup.   Since last being seen in our clinic the patient reports doing well overall. He feels good and no evidence of AF. 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 He has not been able to play since Oct as his wife has Parkinson's disease and is bed bound. He is her primary caretaker.  He has been looking for help at home but has not had luck finding people to take care of her. In regards to his AF, he has been off OAC since 02/2022 and no evidence of AF. He does wear an Apple Watch and no alerts on device.   He denies chest pain, palpitations, dyspnea, PND, orthopnea, nausea, vomiting, dizziness, syncope, edema, weight gain, or early satiety.   Review of systems complete and found to be negative unless listed in HPI.   EP Information / Studies Reviewed:    EKG is ordered today. Personal review as below.  EKG Interpretation Date/Time:  Tuesday September 04 2024 15:03:07 EST Ventricular Rate:  60 PR Interval:  172 QRS Duration:  92 QT Interval:  418 QTC Calculation: 418 R Axis:   28  Text Interpretation: Normal sinus rhythm Normal ECG Confirmed by Aniceto Jarvis ([id number]) on 09/04/2024 3:19:42 PM    Arrhythmia / AAD / Pertinent EP Studies AF  AFL EPS 08/05/21 > PVI + CTI ablation with RF  Risk Assessment/Calculations:    CHA2DS2-VASc Score = 5   This indicates a 7.2% annual risk of stroke. The patient's score is based upon: CHF History: 0 HTN History: 1 Diabetes History: 1 Stroke History: 0 Vascular Disease History: 1 Age  Score: 2 Gender Score: 0             Physical Exam:   VS:  BP 131/62   Pulse 60   Ht 5' 11.5 (1.816 m)   Wt 212 lb 6.4 oz (96.3 kg)   SpO2 97%   BMI 29.21 kg/m    Wt Readings from Last 3 Encounters:  09/04/24 212 lb 6.4 oz (96.3 kg)  06/05/24 213 lb (96.6 kg)  04/18/24 216 lb (98 kg)     GEN: Well nourished, well developed in no acute distress NECK: No JVD; No carotid bruits CARDIAC: Regular rate and rhythm, no murmurs, rubs, gallops RESPIRATORY:  Clear to auscultation without rales, wheezing or rhonchi  ABDOMEN: Soft, non-tender, non-distended EXTREMITIES:  No edema; No deformity   ASSESSMENT AND PLAN:    Paroxysmal Atrial Fibrillation  CHA2DS2-VASc 5, s/p PVI/CTI ablation with RF 2022  -OAC for stroke prophylaxis  -EKG with NSR   -no known symptom burden post ablation    -Toprol  XL 25 mg daily  -monitors with an Apple Watch -Eliquis  stopped in 02/2022  -reviewed if he had recurrent AF burden to call clinic to let us  know as he would need to restart OAC, indicates understanding  Hypertension  -well controlled on current regimen   HLD Aortic Atherosclerosis Former Smoker -per Cardiology    Follow up with Dr. Norton / EP APP in 12 months.  Pt previously followed with  Dr. Alveta. Will refer to Dr. Kriste for primary Cardiology / non-urgent establishment appt.   Signed, Daphne Barrack, NP-C, AGACNP-BC [facility] - Electrophysiology  09/04/2024, 3:19 PM

## 2024-09-04 ENCOUNTER — Encounter: Payer: Self-pay | Admitting: Pulmonary Disease

## 2024-09-04 ENCOUNTER — Ambulatory Visit: Attending: Pulmonary Disease | Admitting: Pulmonary Disease

## 2024-09-04 VITALS — BP 131/62 | HR 60 | Ht 71.5 in | Wt 212.4 lb

## 2024-09-04 DIAGNOSIS — I480 Paroxysmal atrial fibrillation: Secondary | ICD-10-CM | POA: Diagnosis not present

## 2024-09-04 DIAGNOSIS — I10 Essential (primary) hypertension: Secondary | ICD-10-CM

## 2024-09-04 NOTE — Patient Instructions (Addendum)
 Medication Instructions:  Your physician recommends that you continue on your current medications as directed. Please refer to the Current Medication list given to you today.  *If you need a refill on your cardiac medications before your next appointment, please call your pharmacy*  Lab Work: None ordered If you have labs (blood work) drawn today and your tests are completely normal, you will receive your results only by: MyChart Message (if you have MyChart) OR A paper copy in the mail If you have any lab test that is abnormal or we need to change your treatment, we will call you to review the results.  Follow-Up: At [facility], you and your health needs are our priority.  As part of our continuing mission to provide you with exceptional heart care, our providers are all part of one team.  This team includes your primary Cardiologist (physician) and Advanced Practice Providers or APPs (Physician Assistants and Nurse Practitioners) who all work together to provide you with the care you need, when you need it.  Your next appointment:   1 year(s)  Provider:   Daphne Barrack, NP   Dr Kriste in 3 months.   We recommend signing up for the patient portal called MyChart.  Sign up information is provided on this After Visit Summary.  MyChart is used to connect with patients for Virtual Visits (Telemedicine).  Patients are able to view lab/test results, encounter notes, upcoming appointments, etc.  Non-urgent messages can be sent to your provider as well.   To learn more about what you can do with MyChart, go to [email or web address].

## 2025-04-24 ENCOUNTER — Ambulatory Visit
